# Patient Record
Sex: Female | Born: 1935 | Race: White | Hispanic: No | Smoking: Former smoker
Health system: Southern US, Community
[De-identification: ages and names within clinical notes are randomized; demographics above are authoritative.]

## PROBLEM LIST (undated history)

## (undated) DIAGNOSIS — I4819 Other persistent atrial fibrillation: Secondary | ICD-10-CM

## (undated) DIAGNOSIS — F0390 Unspecified dementia without behavioral disturbance: Secondary | ICD-10-CM

## (undated) DIAGNOSIS — N6310 Unspecified lump in the right breast, unspecified quadrant: Secondary | ICD-10-CM

## (undated) DIAGNOSIS — D509 Iron deficiency anemia, unspecified: Secondary | ICD-10-CM

## (undated) DIAGNOSIS — H269 Unspecified cataract: Secondary | ICD-10-CM

## (undated) DIAGNOSIS — I639 Cerebral infarction, unspecified: Secondary | ICD-10-CM

## (undated) DIAGNOSIS — E785 Hyperlipidemia, unspecified: Secondary | ICD-10-CM

## (undated) DIAGNOSIS — M8580 Other specified disorders of bone density and structure, unspecified site: Secondary | ICD-10-CM

## (undated) DIAGNOSIS — R001 Bradycardia, unspecified: Secondary | ICD-10-CM

## (undated) DIAGNOSIS — R42 Dizziness and giddiness: Secondary | ICD-10-CM

## (undated) DIAGNOSIS — K5520 Angiodysplasia of colon without hemorrhage: Secondary | ICD-10-CM

## (undated) DIAGNOSIS — M1990 Unspecified osteoarthritis, unspecified site: Secondary | ICD-10-CM

## (undated) DIAGNOSIS — G2581 Restless legs syndrome: Secondary | ICD-10-CM

## (undated) HISTORY — DX: Dizziness and giddiness: R42

## (undated) HISTORY — DX: Hyperlipidemia, unspecified: E78.5

## (undated) HISTORY — DX: Restless legs syndrome: G25.81

## (undated) HISTORY — DX: Unspecified cataract: H26.9

## (undated) HISTORY — DX: Cerebral infarction, unspecified: I63.9

## (undated) HISTORY — PX: BREAST EXCISIONAL BIOPSY: SUR124

## (undated) HISTORY — DX: Unspecified lump in the right breast, unspecified quadrant: N63.10

## (undated) HISTORY — DX: Bradycardia, unspecified: R00.1

## (undated) HISTORY — DX: Other specified disorders of bone density and structure, unspecified site: M85.80

## (undated) HISTORY — DX: Iron deficiency anemia, unspecified: D50.9

## (undated) HISTORY — PX: CARDIOVERSION: SHX1299

## (undated) HISTORY — DX: Angiodysplasia of colon without hemorrhage: K55.20

## (undated) HISTORY — DX: Other persistent atrial fibrillation: I48.19

---

## 2000-05-14 ENCOUNTER — Ambulatory Visit (HOSPITAL_COMMUNITY): Admission: RE | Admit: 2000-05-14 | Discharge: 2000-05-14 | Payer: Self-pay | Admitting: Gastroenterology

## 2000-07-07 ENCOUNTER — Other Ambulatory Visit: Admission: RE | Admit: 2000-07-07 | Discharge: 2000-07-07 | Payer: Self-pay | Admitting: Family Medicine

## 2001-10-29 ENCOUNTER — Other Ambulatory Visit: Admission: RE | Admit: 2001-10-29 | Discharge: 2001-10-29 | Payer: Self-pay | Admitting: Family Medicine

## 2003-12-23 ENCOUNTER — Encounter: Admission: RE | Admit: 2003-12-23 | Discharge: 2003-12-23 | Payer: Self-pay | Admitting: Orthopedic Surgery

## 2004-02-10 ENCOUNTER — Other Ambulatory Visit: Admission: RE | Admit: 2004-02-10 | Discharge: 2004-02-10 | Payer: Self-pay | Admitting: Family Medicine

## 2005-08-05 DIAGNOSIS — I639 Cerebral infarction, unspecified: Secondary | ICD-10-CM

## 2005-08-05 HISTORY — DX: Cerebral infarction, unspecified: I63.9

## 2005-11-08 ENCOUNTER — Ambulatory Visit: Payer: Self-pay | Admitting: Internal Medicine

## 2005-11-08 ENCOUNTER — Inpatient Hospital Stay (HOSPITAL_COMMUNITY): Admission: EM | Admit: 2005-11-08 | Discharge: 2005-11-13 | Payer: Self-pay | Admitting: Emergency Medicine

## 2005-11-09 ENCOUNTER — Encounter (INDEPENDENT_AMBULATORY_CARE_PROVIDER_SITE_OTHER): Payer: Self-pay | Admitting: Interventional Cardiology

## 2005-11-13 ENCOUNTER — Ambulatory Visit: Payer: Self-pay | Admitting: Cardiology

## 2006-07-21 ENCOUNTER — Other Ambulatory Visit: Admission: RE | Admit: 2006-07-21 | Discharge: 2006-07-21 | Payer: Self-pay | Admitting: Family Medicine

## 2006-11-10 ENCOUNTER — Ambulatory Visit: Payer: Self-pay | Admitting: Cardiology

## 2007-02-04 ENCOUNTER — Ambulatory Visit: Payer: Self-pay | Admitting: Cardiology

## 2008-04-20 ENCOUNTER — Ambulatory Visit: Payer: Self-pay | Admitting: Cardiology

## 2008-06-08 ENCOUNTER — Ambulatory Visit: Payer: Self-pay | Admitting: Cardiology

## 2008-08-10 ENCOUNTER — Ambulatory Visit: Payer: Self-pay | Admitting: Cardiology

## 2008-09-07 ENCOUNTER — Ambulatory Visit: Payer: Self-pay | Admitting: Cardiology

## 2008-10-24 DIAGNOSIS — M8580 Other specified disorders of bone density and structure, unspecified site: Secondary | ICD-10-CM | POA: Insufficient documentation

## 2008-10-24 DIAGNOSIS — M899 Disorder of bone, unspecified: Secondary | ICD-10-CM | POA: Insufficient documentation

## 2008-10-24 DIAGNOSIS — M949 Disorder of cartilage, unspecified: Secondary | ICD-10-CM

## 2008-10-24 DIAGNOSIS — I495 Sick sinus syndrome: Secondary | ICD-10-CM | POA: Insufficient documentation

## 2008-10-24 DIAGNOSIS — D508 Other iron deficiency anemias: Secondary | ICD-10-CM | POA: Insufficient documentation

## 2008-10-24 DIAGNOSIS — E785 Hyperlipidemia, unspecified: Secondary | ICD-10-CM

## 2008-10-24 DIAGNOSIS — I6350 Cerebral infarction due to unspecified occlusion or stenosis of unspecified cerebral artery: Secondary | ICD-10-CM | POA: Insufficient documentation

## 2008-10-27 ENCOUNTER — Ambulatory Visit: Payer: Self-pay | Admitting: Cardiology

## 2008-11-03 ENCOUNTER — Encounter: Payer: Self-pay | Admitting: Internal Medicine

## 2008-11-03 ENCOUNTER — Ambulatory Visit: Payer: Self-pay | Admitting: Internal Medicine

## 2008-11-03 DIAGNOSIS — I4891 Unspecified atrial fibrillation: Secondary | ICD-10-CM

## 2008-11-03 DIAGNOSIS — I482 Chronic atrial fibrillation, unspecified: Secondary | ICD-10-CM | POA: Insufficient documentation

## 2008-11-21 ENCOUNTER — Encounter: Payer: Self-pay | Admitting: Internal Medicine

## 2008-11-21 ENCOUNTER — Ambulatory Visit: Payer: Self-pay

## 2008-12-12 ENCOUNTER — Ambulatory Visit: Payer: Self-pay | Admitting: Internal Medicine

## 2008-12-15 ENCOUNTER — Telehealth: Payer: Self-pay | Admitting: Internal Medicine

## 2008-12-19 ENCOUNTER — Telehealth: Payer: Self-pay | Admitting: Internal Medicine

## 2008-12-26 ENCOUNTER — Telehealth: Payer: Self-pay | Admitting: Internal Medicine

## 2008-12-27 ENCOUNTER — Encounter (INDEPENDENT_AMBULATORY_CARE_PROVIDER_SITE_OTHER): Payer: Self-pay

## 2008-12-27 ENCOUNTER — Encounter: Payer: Self-pay | Admitting: Internal Medicine

## 2008-12-29 ENCOUNTER — Telehealth: Payer: Self-pay | Admitting: Internal Medicine

## 2009-01-05 ENCOUNTER — Encounter: Payer: Self-pay | Admitting: Internal Medicine

## 2009-01-12 ENCOUNTER — Ambulatory Visit: Payer: Self-pay | Admitting: Internal Medicine

## 2009-01-12 ENCOUNTER — Ambulatory Visit (HOSPITAL_COMMUNITY): Admission: RE | Admit: 2009-01-12 | Discharge: 2009-01-12 | Payer: Self-pay | Admitting: Internal Medicine

## 2009-02-09 ENCOUNTER — Ambulatory Visit: Payer: Self-pay | Admitting: Internal Medicine

## 2009-02-23 ENCOUNTER — Ambulatory Visit: Payer: Self-pay

## 2009-02-23 ENCOUNTER — Ambulatory Visit: Payer: Self-pay | Admitting: Internal Medicine

## 2009-04-24 ENCOUNTER — Ambulatory Visit: Payer: Self-pay | Admitting: Internal Medicine

## 2009-06-12 ENCOUNTER — Telehealth: Payer: Self-pay | Admitting: Internal Medicine

## 2009-08-11 ENCOUNTER — Encounter (INDEPENDENT_AMBULATORY_CARE_PROVIDER_SITE_OTHER): Payer: Self-pay | Admitting: *Deleted

## 2009-10-04 ENCOUNTER — Ambulatory Visit: Payer: Self-pay | Admitting: Cardiology

## 2009-10-13 ENCOUNTER — Ambulatory Visit: Payer: Self-pay | Admitting: Internal Medicine

## 2009-10-24 ENCOUNTER — Telehealth: Payer: Self-pay | Admitting: Internal Medicine

## 2009-11-10 ENCOUNTER — Ambulatory Visit (HOSPITAL_COMMUNITY): Admission: RE | Admit: 2009-11-10 | Discharge: 2009-11-10 | Payer: Self-pay | Admitting: Internal Medicine

## 2009-11-10 ENCOUNTER — Ambulatory Visit: Payer: Self-pay | Admitting: Internal Medicine

## 2009-11-10 ENCOUNTER — Encounter (INDEPENDENT_AMBULATORY_CARE_PROVIDER_SITE_OTHER): Payer: Self-pay | Admitting: *Deleted

## 2009-11-10 ENCOUNTER — Ambulatory Visit: Payer: Self-pay

## 2009-11-10 ENCOUNTER — Encounter: Payer: Self-pay | Admitting: Internal Medicine

## 2009-12-18 ENCOUNTER — Encounter: Payer: Self-pay | Admitting: Cardiology

## 2010-01-03 ENCOUNTER — Encounter: Payer: Self-pay | Admitting: Cardiology

## 2010-03-02 ENCOUNTER — Encounter (INDEPENDENT_AMBULATORY_CARE_PROVIDER_SITE_OTHER): Payer: Self-pay | Admitting: *Deleted

## 2010-03-02 ENCOUNTER — Ambulatory Visit: Payer: Self-pay | Admitting: Internal Medicine

## 2010-04-05 ENCOUNTER — Encounter: Payer: Self-pay | Admitting: Internal Medicine

## 2010-04-05 HISTORY — PX: OTHER SURGICAL HISTORY: SHX169

## 2010-04-10 ENCOUNTER — Encounter: Payer: Self-pay | Admitting: Internal Medicine

## 2010-04-16 ENCOUNTER — Ambulatory Visit: Payer: Self-pay | Admitting: Cardiovascular Disease

## 2010-04-16 ENCOUNTER — Ambulatory Visit (HOSPITAL_COMMUNITY): Admission: RE | Admit: 2010-04-16 | Discharge: 2010-04-16 | Payer: Self-pay | Admitting: Cardiovascular Disease

## 2010-04-17 ENCOUNTER — Ambulatory Visit: Payer: Self-pay | Admitting: Internal Medicine

## 2010-04-17 ENCOUNTER — Ambulatory Visit (HOSPITAL_COMMUNITY): Admission: RE | Admit: 2010-04-17 | Discharge: 2010-04-18 | Payer: Self-pay | Admitting: Internal Medicine

## 2010-05-10 ENCOUNTER — Encounter: Admission: RE | Admit: 2010-05-10 | Discharge: 2010-05-10 | Payer: Self-pay | Admitting: Family Medicine

## 2010-05-29 ENCOUNTER — Encounter: Payer: Self-pay | Admitting: Cardiology

## 2010-05-30 ENCOUNTER — Telehealth: Payer: Self-pay | Admitting: Cardiology

## 2010-05-30 ENCOUNTER — Ambulatory Visit: Payer: Self-pay | Admitting: Cardiology

## 2010-07-18 ENCOUNTER — Encounter: Payer: Self-pay | Admitting: Internal Medicine

## 2010-07-18 ENCOUNTER — Ambulatory Visit: Payer: Self-pay | Admitting: Internal Medicine

## 2010-07-23 ENCOUNTER — Encounter: Payer: Self-pay | Admitting: Internal Medicine

## 2010-09-02 LAB — CONVERTED CEMR LAB
ALT: 21 units/L (ref 0–35)
AST: 24 units/L (ref 0–37)
Albumin: 3.8 g/dL (ref 3.5–5.2)
Alkaline Phosphatase: 63 units/L (ref 39–117)
Bilirubin, Direct: 0.1 mg/dL (ref 0.0–0.3)
Free T4: 1.1 ng/dL (ref 0.6–1.6)
TSH: 1.43 microintl units/mL (ref 0.35–5.50)
Total Bilirubin: 0.5 mg/dL (ref 0.3–1.2)
Total Protein: 6.9 g/dL (ref 6.0–8.3)

## 2010-09-06 ENCOUNTER — Telehealth: Payer: Self-pay | Admitting: Internal Medicine

## 2010-09-06 NOTE — Miscellaneous (Signed)
Summary: Outpatient Coinsurance Notice  Outpatient Coinsurance Notice   Imported By: Marylou Mccoy 11/10/2009 18:10:22  _____________________________________________________________________  External Attachment:    Type:   Image     Comment:   External Document

## 2010-09-06 NOTE — Miscellaneous (Signed)
  Clinical Lists Changes  Medications: Changed medication from AMIODARONE HCL 200 MG TABS (AMIODARONE HCL) Take one tablet by mouth once daily to AMIODARONE HCL 200 MG TABS (AMIODARONE HCL) Take 1/2tablet by mouth once daily

## 2010-09-06 NOTE — Assessment & Plan Note (Signed)
Summary: [facility]   Visit Type:  Follow-up Primary Provider:  Rudi Heap, MD  CC:  Atrial Fibrillation.  History of Present Illness: The patient presents for followup after atrial fibrillation ablation. Since that procedure she has done well. She has had new palpitations and no sustained  symptoms. She has had no presyncope or syncope. She has had no shortness of breath, PND or orthopnea. She had no chest pain.  She had no problems her vascular access sites. She remains off antiarrhythmics but she is still on Coumadin.  Current Medications (verified): 1)  Warfarin Sodium 5 Mg Tabs (Warfarin Sodium) .... Use As Directed By Anticoagulation Clinic 2)  Lipitor 10 Mg Tabs (Atorvastatin Calcium) .... Take One Tablet By Mouth Daily. 3)  Multivitamins   Tabs (Multiple Vitamin) .Marland Kitchen.. 1 By Mouth Once Daily 4)  Vitamin D 2000 Unit Caps (Cholecalciferol) .... Once Daily 5)  Magnesium Oxide 250 Mg Tabs (Magnesium Oxide) .... Once Daily 6)  Protonix 40 Mg Solr (Pantoprazole Sodium) .Marland Kitchen.. 1 By Mouth Daily 7)  Calcium 500 Mg Tabs (Calcium) .Marland Kitchen.. 1 By Mouth Daily  Allergies (verified): 1)  ! * No Ivp Dye Allergy 2)  ! * No Latex Allergy 3)  ! * No Shellfish Allergy  Past History:  Past Medical History: Reviewed history from 12/12/2008 and no changes required.  1. Persistent atrial fibrillation  2. Recently-diagnosed hyperlipidemia.  3. Cerebrovascular accident 2007.  4. Sinus bradycardia.  5. Microcytic anemia.  6. Osteopenia.  7. 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  8. Benign breast mass.  Past Surgical History: Reviewed history from 10/24/2008 and no changes required. Breast mass resected.  Review of Systems       As stated in the HPI and negative for all other systems.   Vital Signs:  Patient profile:   77 year old female Height:      62 inches Weight:      121 pounds BMI:     22.21 Pulse rate:   54 / minute Resp:     16 per minute BP sitting:   126  / 62  (right arm)  Vitals Entered By: Marrion Coy, CNA (May 30, 2010 1:21 PM)  Physical Exam  General:  thin elderly female, NAD Head:  normocephalic and atraumatic Eyes:  PERRLA/EOM intact; conjunctiva and lids normal. Neck:  Neck supple, no JVD. No masses, thyromegaly or abnormal cervical nodes. Chest Wall:  no deformities or breast masses noted Lungs:  Clear bilaterally to auscultation and percussion. Heart:  Non-displaced PMI, chest non-tender; regular rate and rhythm, S1, S2 without murmurs, rubs or gallops. Carotid upstroke normal, no bruit. Normal abdominal aortic size, no bruits. Femorals normal pulses, no bruits. Pedals normal pulses. No edema, no varicosities. Abdomen:  Bowel sounds positive; abdomen soft and non-tender without masses, organomegaly, or hernias noted. No hepatosplenomegaly. Msk:  Back normal, normal gait. Muscle strength and tone normal. Extremities:  No clubbing or cyanosis. Neurologic:  Alert and oriented x 3. Skin:  Intact without lesions or rashes. Cervical Nodes:  no significant adenopathy Psych:  Normal affect.   EKG  Procedure date:  05/30/2010  Findings:      Sinus rhythm, rate52, axis within normal limits, intervals within normal limits no acute ST-T wave changes, poor anterior R-wave progression  Impression & Recommendations:  Problem # 1:  ATRIAL FIBRILLATION (ICD-427.31) Thus far she has had successful ablation of her atrial fibrillation. I will defer to Dr. Johney Frame whether he wants to discontinue her  coumadin.  She will see him in December. Orders: EKG w/ Interpretation (93000)  Problem # 2:  HYPERTENSION (ICD-401.9) Her blood pressure is controlled and no further therapy is indicated.  Patient Instructions: 1)  Your physician recommends that you schedule a follow-up appointment in: 1 yr with Dr Antoine Poche in [place] office 2)  Your physician recommends that you continue on your current medications as directed. Please refer to the  Current Medication list given to you today.  Appended Document: [facility] Of note the patient is to have a possible biopsy or removal of a breast lump. If this is necessary she would be at acceptable risk from a surgical standpoint. She could come off of Coumadin.

## 2010-09-06 NOTE — Miscellaneous (Signed)
Summary: med change  Clinical Lists Changes  Observations: Added new observation of PI CARDIO: Your physician has recommended that you have an ablation.  Catheter ablation is a medical procedure used to treat some cardiac arrhythmias (irregular heartbeats). During catheter ablation, a long, thin, flexible tube is put into a blood vessel in your groin (upper thigh), or neck. This tube is called an ablation catheter. It is then guided to your heart through the blood vessel. Radiofrequency waves destroy small areas of heart tissue where abnormal heartbeats may cause an arrhythmia to start.  Please see the instruction sheet given to you today. Your physician has recommended you make the following change in your medication: decrease Amiodarone to 1/2 tablet daily (03/02/2010 12:17)      Patient Instructions: 1)  Your physician has recommended that you have an ablation.  Catheter ablation is a medical procedure used to treat some cardiac arrhythmias (irregular heartbeats). During catheter ablation, a long, thin, flexible tube is put into a blood vessel in your groin (upper thigh), or neck. This tube is called an ablation catheter. It is then guided to your heart through the blood vessel. Radiofrequency waves destroy small areas of heart tissue where abnormal heartbeats may cause an arrhythmia to start.  Please see the instruction sheet given to you today. 2)  Your physician has recommended you make the following change in your medication: decrease Amiodarone to 1/2 tablet daily

## 2010-09-06 NOTE — Assessment & Plan Note (Signed)
Summary: [facility]   Visit Type:  Follow-up Primary Provider:  Rudi Heap, MD  CC:  Atrial Fibrillation.Marland Kitchen  History of Present Illness: The patient presents for followup of this. She has seen Dr. all read. There was discussion of atrial fibrillation ablation. However, he chose to treat her with Rythmol first. She had cardioversion and did maintain sinus rhythm for a period afterward. However, at some time in the past weeks she noted irregular heartbeat, fatigue similar to her previous fibrillation. She assumed she was in fibrillation and this is demonstrated to date. She has not had any chest pressure, neck or arm discomfort. She has not had any presyncope or syncope. She has had no new shortness of breath, PND or orthopnea.  Current Medications (verified): 1)  Warfarin Sodium 5 Mg Tabs (Warfarin Sodium) .... Use As Directed By Anticoagulation Clinic 2)  Lipitor 10 Mg Tabs (Atorvastatin Calcium) .... Take One Tablet By Mouth Daily. 3)  Multivitamins   Tabs (Multiple Vitamin) .Marland Kitchen.. 1 By Mouth Once Daily 4)  Vitamin D 400 Unit  Tabs (Cholecalciferol) .Marland Kitchen.. 1 By Mouth Once Daily 5)  Rythmol Sr 225 Mg Xr12h-Cap (Propafenone Hcl) .... Q 12 Hours 6)  Calcium Carbonate   Powd (Calcium Carbonate) .Marland Kitchen.. 1 By Mouth Once Daily 7)  Magnesium 200 Mg Tabs (Magnesium) .... With Zinc 400mg  Daily  Allergies (verified): 1)  ! * No Ivp Dye Allergy 2)  ! * No Latex Allergy 3)  ! * No Shellfish Allergy  Past History:  Past Medical History: Reviewed history from 12/12/2008 and no changes required.  1. Persistent atrial fibrillation  2. Recently-diagnosed hyperlipidemia.  3. Cerebrovascular accident 2007.  4. Sinus bradycardia.  5. Microcytic anemia.  6. Osteopenia.  7. 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  8. Benign breast mass.  Past Surgical History: Reviewed history from 10/24/2008 and no changes required. Breast mass resected.  Review of Systems       As stated in the  HPI and negative for all other systems.   Vital Signs:  Patient profile:   77 year old female Height:      62 inches Weight:      114 pounds BMI:     20.93 Pulse rate:   107 / minute Resp:     16 per minute BP sitting:   112 / 70  (right arm)  Vitals Entered By: Marrion Coy, CNA (October 04, 2009 2:55 PM)  Physical Exam  General:  Well developed, well nourished, in no acute distress. Head:  normocephalic and atraumatic Eyes:  PERRLA/EOM intact; conjunctiva and lids normal. Mouth:  Dentures. Oral mucosa normal. Neck:  Neck supple, no JVD. No masses, thyromegaly or abnormal cervical nodes. Chest Wall:  no deformities or breast masses noted Lungs:  Clear bilaterally to auscultation and percussion. Abdomen:  Bowel sounds positive; abdomen soft and non-tender without masses, organomegaly, or hernias noted. No hepatosplenomegaly. Msk:  Back normal, normal gait. Muscle strength and tone normal. Extremities:  No clubbing or cyanosis. Neurologic:  Alert and oriented x 3. Skin:  Intact without lesions or rashes. Psych:  Normal affect.   Detailed Cardiovascular Exam  Neck    Carotids: Carotids full and equal bilaterally without bruits.      Neck Veins: Normal, no JVD.    Heart    Inspection: no deformities or lifts noted.      Palpation: normal PMI with no thrills palpable.      Auscultation: irregular rate and rhythm, S1,  S2 without murmurs, rubs, gallops, or clicks.    Vascular    Abdominal Aorta: no palpable masses, pulsations, or audible bruits.      Femoral Pulses: normal femoral pulses bilaterally.      Pedal Pulses: normal pedal pulses bilaterally.      Radial Pulses: normal radial pulses bilaterally.      Peripheral Circulation: no clubbing, cyanosis, or edema noted with normal capillary refill.     EKG  Procedure date:  10/04/2009  Findings:      she'll fibrillation, rate 100, axis within normal limits, intervals within normal limits, low voltage in limb  leads  Impression & Recommendations:  Problem # 1:  ATRIAL FIBRILLATION (ICD-427.31) The patient is back in atrial fibrillation and she feels this. She's failed rate control in the past with tachybradycardia response to metoprolol and pindolol. We could consider further antiarrhythmic therapy but I would also again like to discuss the possibility of ablation with Dr. Johney Frame. I have sent him a note and we'll discuss further care plans with the patient after consultation with him. Of note she would like to avoid decrease in therapy and she understands that requires hospitalization. She continues on her Coumadin followed by her primary physicians.  Patient Instructions: 1)  Your physician recommends that you schedule a follow-up appointment in: 6 MONTHS 2)  Your physician has recommended you make the following change in your medication: STOP RHYTHMOL

## 2010-09-06 NOTE — Miscellaneous (Signed)
  Clinical Lists Changes  Observations: Added new observation of ECHOINTERP: - Left ventricle: The cavity size was normal. Wall thickness was       normal. Systolic function was normal. The estimated ejection       fraction was in the range of 50% to 55%. Septal hypokinesis. Wall       motion was normal; there were no regional wall motion       abnormalities.     - Mitral valve: Mildly calcified annulus. Mild regurgitation.     - Left atrium: The atrium was moderately dilated.     - Right atrium: The atrium was moderately dilated.     - Tricuspid valve: Mild-moderate regurgitation.     - Pulmonary arteries: Systolic pressure was mildly to moderately       increased. PA peak pressure: 45mm Hg (S). (11/10/2009 9:21)      Echocardiogram  Procedure date:  11/10/2009  Findings:      - Left ventricle: The cavity size was normal. Wall thickness was       normal. Systolic function was normal. The estimated ejection       fraction was in the range of 50% to 55%. Septal hypokinesis. Wall       motion was normal; there were no regional wall motion       abnormalities.     - Mitral valve: Mildly calcified annulus. Mild regurgitation.     - Left atrium: The atrium was moderately dilated.     - Right atrium: The atrium was moderately dilated.     - Tricuspid valve: Mild-moderate regurgitation.     - Pulmonary arteries: Systolic pressure was mildly to moderately       increased. PA peak pressure: 45mm Hg (S).

## 2010-09-06 NOTE — Progress Notes (Signed)
Summary: pt needs note to stop coumadin for surgery  Phone Note From Other Clinic   Caller: Edie from Dr. Jamey Ripa Request: Talk with Nurse, Talk with Provider Summary of Call: pt may be having brest surgery and needs to know when she can stop her coumidan pls fax to [phone] attn: Edie she is the nurse for Dr. Jamey Ripa  Initial call taken by: Omer Jack,  May 30, 2010 3:29 PM  Follow-up for Phone Call        I faxed Dr Jenene Slicker office note from 05/30/10 that says she could come off Coumadin  to Dr Jamey Ripa attn Edie--there was no call back number for [facility]

## 2010-09-06 NOTE — Progress Notes (Signed)
Summary: med question  Phone Note Call from Patient Call back at Home Phone [phone]   Caller: Patient Reason for Call: Talk to Nurse Summary of Call: request to speak to nurse about meds, please call before 10:30 Initial call taken by: Migdalia Dk,  October 24, 2009 8:27 AM  Follow-up for Phone Call        INR-3.1 yesterday but she is having a some stomach problems.  Discussed with Dr Johney Frame he said to give it some time and hopes that the stomach problems will subside.  Call me back if no better in a couple of weeks.  LMOM Dennis Bast, RN, BSN  October 25, 2009 2:26 PM

## 2010-09-06 NOTE — Letter (Signed)
Summary: ELectrophysiology/Ablation Procedure Instructions  [facility], Main Office  [address]   [place], [place] [address]   Phone: [phone]  Fax: [phone]     Electrophysiology/Ablation Procedure Instructions    You are scheduled for a(n) afib ablation on 04/17/10 at 7:30am with Dr. Johney Frame.  1.  Please come to the Short Stay Center at [facility] at 5:30am on the day of your procedure.  2.  Come prepared to stay overnight.   Please bring your insurance cards and a list of your medications.  3.  Come to Dr Kathi Der office on 04/10/10 for lab work. You do not have to be fasting.  4.  Do not have anything to eat or drink after midnight the night before your procedure.  5.   All of your medications may be taken with a small amount of water.  6.  Educational material received:  _____ EP   _____ Ablation   * Occasionally, EP studies and ablations can become lengthy.  Please make your family aware of this before your procedure starts.  Average time ranges from 2-8 hours for EP studies/ablations.  Your physician will locate your family after the procedure with the results.  * If you have any questions after you get home, please call the office at [phone]. [name]

## 2010-09-06 NOTE — Assessment & Plan Note (Signed)
Summary: per check out/ok per jamie/saf   Visit Type:  Follow-up Referring Provider:  Angelina Sheriff, MD Primary Provider:  Rudi Heap, MD   History of Present Illness: The patient presents today for routine electrophysiology followup.  She continues to be "breathless" at times.  She also reports occasional chest "fluttering".  The patient denies symptoms of  chest pain, orthopnea, PND, lower extremity edema, dizziness, presyncope, syncope, or neurologic sequela. The patient is tolerating medications without difficulties and is otherwise without complaint today.   Current Medications (verified): 1)  Warfarin Sodium 5 Mg Tabs (Warfarin Sodium) .... Use As Directed By Anticoagulation Clinic 2)  Lipitor 10 Mg Tabs (Atorvastatin Calcium) .... Take One Tablet By Mouth Daily. 3)  Multivitamins   Tabs (Multiple Vitamin) .Marland Kitchen.. 1 By Mouth Once Daily 4)  Vitamin D 2000 Unit Caps (Cholecalciferol) .... Once Daily 5)  All Day Calcium 600-500 Mg-Unit Xr24h-Tab (Calcium Carb-Cholecalciferol) .... Once Daily 6)  Magnesium Oxide 250 Mg Tabs (Magnesium Oxide) .... Once Daily 7)  Amiodarone Hcl 200 Mg Tabs (Amiodarone Hcl) .... Take One Tablet By Mouth Twice A Day  Allergies (verified): 1)  ! * No Ivp Dye Allergy 2)  ! * No Latex Allergy 3)  ! * No Shellfish Allergy  Past History:  Past Medical History: Reviewed history from 12/12/2008 and no changes required.  1. Persistent atrial fibrillation  2. Recently-diagnosed hyperlipidemia.  3. Cerebrovascular accident 2007.  4. Sinus bradycardia.  5. Microcytic anemia.  6. Osteopenia.  7. 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  8. Benign breast mass.  Past Surgical History: Reviewed history from 10/24/2008 and no changes required. Breast mass resected.  Social History: Reviewed history from 11/03/2008 and no changes required. The patient is retired.  She is married.  She has four stepchildren.  She quit smoking in the 1980s after one  pack per day for 30 years. She denies alcohol use she lives in [place].  Her husband recently had a stroke and is presently living in a nursing home.  Review of Systems       All systems are reviewed and negative except as listed in the HPI.   Vital Signs:  Patient profile:   77 year old female Height:      62 inches Weight:      117 pounds BMI:     21.48 Pulse rate:   41 / minute BP sitting:   128 / 64  (left arm)  Vitals Entered By: Laurance Flatten CMA (November 10, 2009 11:31 AM)  Physical Exam  General:  thin elderly female, NAD Head:  normocephalic and atraumatic Eyes:  PERRLA/EOM intact; conjunctiva and lids normal. Mouth:  Teeth, gums and palate normal. Oral mucosa normal. Neck:  Neck supple, no JVD. No masses, thyromegaly or abnormal cervical nodes. Lungs:  Clear bilaterally to auscultation and percussion. Heart:  RRR, no m/r/g Abdomen:  Bowel sounds positive; abdomen soft and non-tender without masses, organomegaly, or hernias noted. No hepatosplenomegaly. Msk:  Back normal, normal gait. Muscle strength and tone normal. Pulses:  pulses normal in all 4 extremities Extremities:  No clubbing or cyanosis. Neurologic:  Alert and oriented x 3. Skin:  Intact without lesions or rashes. Cervical Nodes:  no significant adenopathy Psych:  Normal affect.   EKG  Procedure date:  11/10/2009  Findings:      sinus bradycardia 40  bpm, PR 144, QTc 448, otherwise normal ekg  Impression & Recommendations:  Problem # 1:  ATRIAL FIBRILLATION (ICD-427.31)  She appears to be in sinus bradycardia today.  I will decrease amiodarone to 200mg  daily today.  We had a long discussion regarding therapeutic options.  I am awaiting echo results to evaluate LA size.  I think that we should consider cathter ablation given her concerns about longterm amiodarone, however, she would like to continue amiodarone at this time. She is quite bradycardic but stable.  She is clear in her decision to avoid  pacemaker implant.  I will decrease amiodarone to 100mg  daily if she remains in sinus rhythm upon return.  Problem # 2:  SINUS BRADYCARDIA (ICD-427.81) decrease amiodarone today pt declines pacemaker consider decreasing amiodarone to 100mg  daily upon return and possibly catheter ablation  Problem # 3:  HYPERTENSION (ICD-401.9) stable  Other Orders: EKG w/ Interpretation (93000)  Patient Instructions: 1)  Your physician recommends that you schedule a follow-up appointment in: 2 months with Dr Johney Frame 2)  Your physician has recommended you make the following change in your medication: decrease Amiodarone to 200mg  daily

## 2010-09-06 NOTE — Letter (Signed)
Summary: Appointment - Reminder 2  [facility], Main Office  [address]   [place], [place] [address]   Phone: [phone]  Fax: [phone]     August 11, 2009 MRN: [id number]   [name] [address] [place], [place]  [address]   Dear Ms. UTKE,  Our records indicate that it is time to schedule a follow-up appointment. Dr.Hochrein recommended that you follow up with [place] in March,2011. It is very important that we reach you to schedule this appointment. We look forward to participating in your health care needs. Please contact us at the number listed above at your earliest convenience to schedule your appointment.  If you are unable to make an appointment at this time, give [place] a call so we can update our records.     Sincerely,   Glass blower/designer

## 2010-09-06 NOTE — Assessment & Plan Note (Signed)
Summary: rov/discuss ablation   Visit Type:  Follow-up Referring Provider:  Angelina Sheriff, MD Primary Provider:  Rudi Heap, MD   History of Present Illness: The patient presents today for electrophysiology followup. She did well initially with rhythmol.  Unfortunately, she has developed recurrent symptomatic afib.  She reports being in afib all of the time for several months.  She reports palpitations, fatigue, and decreased exercise tolerance.  The patient denies symptoms ofchest pain, shortness of breath, orthopnea, PND, lower extremity edema, presyncope, syncope, or neurologic sequela.  She has occasional vertiginous dizziness.  The patient is tolerating medications without difficulties and is otherwise without complaint today.   Current Medications (verified): 1)  Warfarin Sodium 5 Mg Tabs (Warfarin Sodium) .... Use As Directed By Anticoagulation Clinic 2)  Lipitor 10 Mg Tabs (Atorvastatin Calcium) .... Take One Tablet By Mouth Daily. 3)  Multivitamins   Tabs (Multiple Vitamin) .Marland Kitchen.. 1 By Mouth Once Daily 4)  Vitamin D 2000 Unit Caps (Cholecalciferol) .... Once Daily 5)  All Day Calcium 600-500 Mg-Unit Xr24h-Tab (Calcium Carb-Cholecalciferol) .... Once Daily 6)  Magnesium Oxide 250 Mg Tabs (Magnesium Oxide) .... Once Daily  Allergies: 1)  ! * No Ivp Dye Allergy 2)  ! * No Latex Allergy 3)  ! * No Shellfish Allergy  Past History:  Past Medical History: Reviewed history from 12/12/2008 and no changes required.  1. Persistent atrial fibrillation  2. Recently-diagnosed hyperlipidemia.  3. Cerebrovascular accident 2007.  4. Sinus bradycardia.  5. Microcytic anemia.  6. Osteopenia.  7. 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  8. Benign breast mass.  Past Surgical History: Reviewed history from 10/24/2008 and no changes required. Breast mass resected.  Social History: Reviewed history from 11/03/2008 and no changes required. The patient is retired.  She is  married.  She has four stepchildren.  She quit smoking in the 1980s after one pack per day for 30 years. She denies alcohol use she lives in [place].  Her husband recently had a stroke and is presently living in a nursing home.  Review of Systems       All systems are reviewed and negative except as listed in the HPI.   Vital Signs:  Patient profile:   77 year old female Height:      62 inches Weight:      115 pounds BMI:     21.11 Pulse rate:   102 / minute BP sitting:   132 / 80  (left arm)  Vitals Entered By: Laurance Flatten CMA (October 13, 2009 12:42 PM)  Physical Exam  General:  thin elderly female, NAD Head:  normocephalic and atraumatic Eyes:  PERRLA/EOM intact; conjunctiva and lids normal. Mouth:  Teeth, gums and palate normal. Oral mucosa normal. Neck:  Neck supple, no JVD. No masses, thyromegaly or abnormal cervical nodes. Lungs:  Clear bilaterally to auscultation and percussion. Heart:  iRRR, no m/r/g Abdomen:  Bowel sounds positive; abdomen soft and non-tender without masses, organomegaly, or hernias noted. No hepatosplenomegaly. Msk:  Back normal, normal gait. Muscle strength and tone normal. Pulses:  pulses normal in all 4 extremities Extremities:  No clubbing or cyanosis. Neurologic:  Alert and oriented x 3.  strength/sensation are intact Skin:  Intact without lesions or rashes. Psych:  Normal affect.   Impression & Recommendations:  Problem # 1:  ATRIAL FIBRILLATION (ICD-427.31) The patient presents today for EP follow of atrial fibrillation.  She has symptomatic persistent atrial fibrillation.  She has failed medical therapy with  flecainide and beta blockers.  Antiarrhythmic options have been limited by bradycardia in the past, though she has not been very symptomatic with bradycardia.  Her last echo was 2007.  Therapeutic strategies for afib including medicine and ablation were discussed in detail with the patient today. Risk, benefits, and alternatives to EP study  and radiofrequency ablation for afib were also discussed in detail today. These risks include but are not limited to stroke, bleeding, vascular damage, tamponade, perforation, damage to the esophagus, lungs, and other structures, pulmonary vein stenosis, worsening renal function, and death.  Given that she has persistent afib, her success rates from ablation are anticipated to be reduced (60-70%) and will likely require more than 1 procedure.  The patient understands these risk and wishes to continue medical therapy at this time.  I will start the patient on Amiodarone 200mg  BID today.  She is aware to have her INRs followed closely by her PCP.  She may need cardioversion if she remains in afib upon return in 4 wks. We will obtain an echocardiogram to evaluate LA size.  Problem # 2:  SINUS BRADYCARDIA (ICD-427.81) stable will follow HR once back in sinus  Problem # 3:  HYPERTENSION (ICD-401.9) stable  Problem # 4:  HYPERLIPIDEMIA (ICD-272.4)  stable Her updated medication list for this problem includes:    Lipitor 10 Mg Tabs (Atorvastatin calcium) .Marland Kitchen... Take one tablet by mouth daily.  Her updated medication list for this problem includes:    Lipitor 10 Mg Tabs (Atorvastatin calcium) .Marland Kitchen... Take one tablet by mouth daily.  Other Orders: TLB-T4 (Thyrox), Free [phone]) TLB-TSH (Thyroid Stimulating Hormone) (84443-TSH) TLB-Hepatic/Liver Function Pnl (80076-HEPATIC) Echocardiogram (Echo)  Patient Instructions: 1)  Your physician recommends that you schedule a follow-up appointment in: 4 weeks. 2)  Your physician has recommended you make the following change in your medication: Start Amiodarone 200mg  two times a day. 3)  Your physician has requested that you have an echocardiogram.  Echocardiography is a painless test that uses sound waves to create images of your heart. It provides your doctor with information about the size and shape of your heart and how well your heart's chambers and  valves are working.  This procedure takes approximately one hour. There are no restrictions for this procedure. Prescriptions: AMIODARONE HCL 200 MG TABS (AMIODARONE HCL) Take one tablet by mouth twice a day  #60 x 1   Entered by:   Laurance Flatten CMA   Authorized by:   Hillis Range, MD   Signed by:   Laurance Flatten CMA on 10/13/2009   Method used:   Electronically to        [facility] [phone]* (retail)       [address] [place], [place]  [address]       Ph: [phone] or [phone]       Fax: [phone]   RxID:   [id number]

## 2010-09-06 NOTE — Assessment & Plan Note (Signed)
Summary: 2 MONTH/[name]   Visit Type:  Follow-up Referring Provider:  Angelina Sheriff, MD Primary Provider:  Rudi Heap, MD   History of Present Illness: The patient presents today for routine electrophysiology followup.  She continues to episodes of afib despite medical therapy with amiodarone.  She reports tremors and unsteadiness with amiodarone.  The patient denies symptoms of  chest pain, orthopnea, PND, lower extremity edema, dizziness, presyncope, syncope, or neurologic sequela. The patient is tolerating medications without difficulties and is otherwise without complaint today.   Current Medications (verified): 1)  Warfarin Sodium 5 Mg Tabs (Warfarin Sodium) .... Use As Directed By Anticoagulation Clinic 2)  Lipitor 10 Mg Tabs (Atorvastatin Calcium) .... Take One Tablet By Mouth Daily. 3)  Multivitamins   Tabs (Multiple Vitamin) .Marland Kitchen.. 1 By Mouth Once Daily 4)  Vitamin D 2000 Unit Caps (Cholecalciferol) .... Once Daily 5)  All Day Calcium 600-500 Mg-Unit Xr24h-Tab (Calcium Carb-Cholecalciferol) .... Once Daily 6)  Magnesium Oxide 250 Mg Tabs (Magnesium Oxide) .... Once Daily 7)  Amiodarone Hcl 200 Mg Tabs (Amiodarone Hcl) .... Take One Tablet By Mouth Once Daily  Allergies: 1)  ! * No Ivp Dye Allergy 2)  ! * No Latex Allergy 3)  ! * No Shellfish Allergy  Past History:  Past Medical History: Reviewed history from 12/12/2008 and no changes required.  1. Persistent atrial fibrillation  2. Recently-diagnosed hyperlipidemia.  3. Cerebrovascular accident 2007.  4. Sinus bradycardia.  5. Microcytic anemia.  6. Osteopenia.  7. 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  8. Benign breast mass.  Past Surgical History: Reviewed history from 10/24/2008 and no changes required. Breast mass resected.  Family History: Reviewed history from 11/03/2008 and no changes required. stroke  Social History: The patient is retired.  She is widowed.  She has four stepchildren.   She quit smoking in the 1980s after one pack per day for 30 years. She denies alcohol use she lives in [place].    Review of Systems       All systems are reviewed and negative except as listed in the HPI.   Vital Signs:  Patient profile:   77 year old female Height:      62 inches Weight:      116 pounds BMI:     21.29 Pulse rate:   41 / minute BP sitting:   130 / 80  (left arm)  Vitals Entered By: Laurance Flatten CMA (March 02, 2010 11:16 AM)  Physical Exam  General:  thin elderly female, NAD Head:  normocephalic and atraumatic Eyes:  PERRLA/EOM intact; conjunctiva and lids normal. Mouth:  Teeth, gums and palate normal. Oral mucosa normal. Neck:  Neck supple, no JVD. No masses, thyromegaly or abnormal cervical nodes. Lungs:  Clear bilaterally to auscultation and percussion. Heart:  RRR, no m/r/g Abdomen:  Bowel sounds positive; abdomen soft and non-tender without masses, organomegaly, or hernias noted. No hepatosplenomegaly. Msk:  Back normal, normal gait. Muscle strength and tone normal. Pulses:  pulses normal in all 4 extremities Extremities:  No clubbing or cyanosis. Neurologic:  Alert and oriented x 3.   EKG  Procedure date:  03/02/2010  Findings:      sinus bradycardia 40 bpm, otherwise normal ekg  Impression & Recommendations:  Problem # 1:  ATRIAL FIBRILLATION (ICD-427.31) The patient has symptomatic persistent atrial fibrillation.  She has failed medical therapy with amiodarone and reports symptoms of tremors and unsteadiness with this medicine.   I will decrease amiodarone to  100mg  daily today.  We had a long discussion regarding therapeutic options.  I think that we should consider cathter ablation given her concerns about longterm amiodarone.   Risk, benefits, and alternatives to EP study and radiofrequency ablation for afib were also discussed in detail today. These risks include but are not limited to stroke, bleeding, vascular damage, tamponade, perforation,  damage to the esophagus, lungs, and other structures, pulmonary vein stenosis, worsening renal function, and death. The patient understands these risk and wishes to proceed.  We will schedule afib ablation at the next available time.  Problem # 2:  SINUS BRADYCARDIA (ICD-427.81) stable, without symptoms.  She remains clear in her desire to avoid pacemaker. decrease amiodarone

## 2010-09-06 NOTE — Assessment & Plan Note (Signed)
Summary: [name]   Visit Type:  Follow-up Referring Provider:  Angelina Sheriff, MD Primary Provider:  Rudi Heap, MD   History of Present Illness: The patient presents for followup after atrial fibrillation ablation.   She reports doing very well since the procedure.  She is pleased with the results and denies procedure related complications.  She his now off of amiodarone and has had no further afib.  The patient denies symptoms of palpitations, chest pain, shortness of breath, orthopnea, PND, lower extremity edema, dizziness, presyncope, syncope, or neurologic sequela. The patient is tolerating medications without difficulties and is otherwise without complaint today.      Current Medications (verified): 1)  Warfarin Sodium 5 Mg Tabs (Warfarin Sodium) .... Use As Directed By Anticoagulation Clinic 2)  Lipitor 10 Mg Tabs (Atorvastatin Calcium) .... Take One Tablet By Mouth Daily. 3)  Multivitamins   Tabs (Multiple Vitamin) .Marland Kitchen.. 1 By Mouth Once Daily 4)  Vitamin D 2000 Unit Caps (Cholecalciferol) .... Once Daily 5)  Magnesium Oxide 250 Mg Tabs (Magnesium Oxide) .... Once Daily 6)  Calcium 500 Mg Tabs (Calcium) .Marland Kitchen.. 1 By Mouth Daily 7)  Sm Allergy Relief 10 Mg Tbdp (Loratadine) .... Uad  Allergies: 1)  ! * No Ivp Dye Allergy 2)  ! * No Latex Allergy 3)  ! * No Shellfish Allergy  Past History:  Past Medical History:  1. Persistent atrial fibrillation s/p PVI 9/11  2. Recently-diagnosed hyperlipidemia.  3. Cerebrovascular accident 2007.  4. Sinus bradycardia.  5. Microcytic anemia.  6. Osteopenia.  7. 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  8. Benign breast mass.  Past Surgical History: Breast mass resected. s/p afib ablation 9/11  Physical Exam  General:  thin elderly female, NAD Head:  normocephalic and atraumatic Eyes:  PERRLA/EOM intact; conjunctiva and lids normal. Mouth:  Teeth, gums and palate normal. Oral mucosa normal. Neck:  Neck supple, no  JVD. No masses, thyromegaly or abnormal cervical nodes. Lungs:  Clear bilaterally to auscultation and percussion. Heart:  Non-displaced PMI, chest non-tender; regular rate and rhythm, S1, S2 without murmurs, rubs or gallops. Carotid upstroke normal, no bruit. Normal abdominal aortic size, no bruits. Femorals normal pulses, no bruits. Pedals normal pulses. No edema, no varicosities. Abdomen:  Bowel sounds positive; abdomen soft and non-tender without masses, organomegaly, or hernias noted. No hepatosplenomegaly. Msk:  Back normal, normal gait. Muscle strength and tone normal. Extremities:  No clubbing or cyanosis. Neurologic:  Alert and oriented x 3.   EKG  Procedure date:  07/18/2010  Findings:      sinus bradycardia 50 bpm, PR 146, otherwise normal ekg  Impression & Recommendations:  Problem # 1:  ATRIAL FIBRILLATION (ICD-427.31) doing well s/p ablation without further afib off AAD. She wishes to consider pradaxa at this time.  Given her prior stroke, I would recommend either coumadin or pradaxa longterm. We will therefore stop coumadin and then start pradaxa 150mg  two times a day  Patient Instructions: 1)  Your physician wants you to follow-up in: 6 months with Dr Johney Frame  Bonita Quin will receive a reminder letter in the mail two months in advance. If you don't receive a letter, please call our office to schedule the follow-up appointment. 2)  Patient will call me several days prior to running out of Coumadin so I can Call in Pradaxa for her 3)  Pradaxa 150mg  two times a day  Appended Document: [facility]     Allergies: 1)  ! * No Ivp Dye  Allergy 2)  ! * No Latex Allergy 3)  ! * No Shellfish Allergy  Vital Signs:  Patient profile:   77 year old female Height:      62 inches Weight:      121 pounds BMI:     22.21 Pulse rate:   56 / minute BP sitting:   120 / 78  (left arm)  Vitals Entered By: Laurance Flatten CMA (July 18, 2010 2:24 PM)

## 2010-09-07 ENCOUNTER — Telehealth: Payer: Self-pay | Admitting: Internal Medicine

## 2010-09-12 NOTE — Progress Notes (Signed)
Summary: pt has questions re pradaxa  Phone Note Call from Patient   Caller: Patient [phone] Reason for Call: Talk to Nurse Summary of Call: pt has questions re pradaxa Initial call taken by: Glynda Jaeger,  September 06, 2010 11:45 AM  Follow-up for Phone Call        Pt calls b/c her pcp, Dr. Christell Constant in [place], was "concerned about my starting Pradaxa 150mg  two times a day because my liver enzymes were in the [employer] [facility]". Pt says pcp suggested Pradaxa 75 mg bid.   She is aware Tresa Endo & Dr. Johney Frame will be in the office tomorrow.  Her lab work from [facility] office is in the computer for review. She will await call back and is not yet out of Coumadin.  Follow-up by: Lisabeth Devoid RN,  September 06, 2010 12:23 PM     Appended Document: pt has questions re pradaxa Pradaxa is not metabolized by the liver.  Dosing should be made based on creatinine clearance.  We can repeat her creatinine clearance at this time.  If >30, start pradaxa 150mg  two times a day as advised.  If creatinine clearance <30, we will re-evaluate.  Appended Document: pt has questions re pradaxa pt's creatine cl 50.8(ok for the 150mg  two times a day)   She is wanting to start Pradaxa 150mg  two times a day     Clinical Lists Changes  Medications: Added new medication of PRADAXA 150 MG CAPS (DABIGATRAN ETEXILATE MESYLATE) one by mouth two times a day---pt d/cing Warfarin  none valvular afib - Signed Removed medication of WARFARIN SODIUM 5 MG TABS (WARFARIN SODIUM) Use as directed by Anticoagulation Clinic Rx of PRADAXA 150 MG CAPS (DABIGATRAN ETEXILATE MESYLATE) one by mouth two times a day---pt d/cing Warfarin  none valvular afib;  #60 x 6;  Signed;  Entered by: Dennis Bast, RN, BSN;  Authorized by: Hillis Range, MD;  Method used: Electronically to [facility] [phone]*, [address] [place], [place], [place]  [address], Ph: [phone] or [phone], Fax: [phone]    Prescriptions: PRADAXA 150 MG CAPS  (DABIGATRAN ETEXILATE MESYLATE) one by mouth two times a day---pt d/cing Warfarin  none valvular afib  #60 x 6   Entered by:   Dennis Bast, RN, BSN   Authorized by:   Hillis Range, MD   Signed by:   Dennis Bast, RN, BSN on 09/07/2010   Method used:   Electronically to        [facility] [phone]* (retail)       [address] [place], [place]  [address]       Ph: [phone] or [phone]       Fax: [phone]   RxID:   [id number]

## 2010-09-12 NOTE — Progress Notes (Signed)
Summary: pt's pharm can't get pradxa until monday  Phone Note Call from Patient   Caller: Patient [phone] Reason for Call: Talk to Nurse Summary of Call: pt's pharmacy can't get pradaxa until monday and was suppose to start in the am and stop the warfarin-should she continue the warfarin through the weekend? Initial call taken by: Glynda Jaeger,  September 07, 2010 11:40 AM  Follow-up for Phone Call        take the Warfarin today and tomorrow  Hold on Sun and start Pradaxa on Mon  pt aware Dennis Bast, RN, BSN  September 07, 2010 11:43 AM

## 2010-10-18 LAB — PROTIME-INR
INR: 2.43 — ABNORMAL HIGH (ref 0.00–1.49)
INR: 3.01 — ABNORMAL HIGH (ref 0.00–1.49)
Prothrombin Time: 26.5 seconds — ABNORMAL HIGH (ref 11.6–15.2)
Prothrombin Time: 31.3 seconds — ABNORMAL HIGH (ref 11.6–15.2)

## 2010-10-18 LAB — MRSA PCR SCREENING: MRSA by PCR: NEGATIVE

## 2010-11-05 ENCOUNTER — Other Ambulatory Visit: Payer: Self-pay | Admitting: Family Medicine

## 2010-11-05 DIAGNOSIS — Z09 Encounter for follow-up examination after completed treatment for conditions other than malignant neoplasm: Secondary | ICD-10-CM

## 2010-11-12 LAB — PROTIME-INR
INR: 2.7 — ABNORMAL HIGH (ref 0.00–1.49)
Prothrombin Time: 30.6 seconds — ABNORMAL HIGH (ref 11.6–15.2)

## 2010-11-20 ENCOUNTER — Ambulatory Visit
Admission: RE | Admit: 2010-11-20 | Discharge: 2010-11-20 | Disposition: A | Discharge status: Home / Self Care | Payer: Medicare Other | Source: Ambulatory Visit | Attending: Family Medicine | Admitting: Family Medicine

## 2010-11-20 DIAGNOSIS — Z09 Encounter for follow-up examination after completed treatment for conditions other than malignant neoplasm: Secondary | ICD-10-CM

## 2010-12-05 ENCOUNTER — Encounter: Payer: Self-pay | Admitting: Internal Medicine

## 2010-12-18 NOTE — Assessment & Plan Note (Signed)
[facility]                            CARDIOLOGY OFFICE NOTE   NAME:[name], [name] DIN              MRN:          [id number]  DATE:09/07/2008                            DOB:          Dec 22, 1930    PRIMARY CARE PHYSICIAN:  Ernestina Penna, MD   REASON FOR PRESENTATION:  Evaluate the patient with atrial fibrillation.   HISTORY OF PRESENT ILLNESS:  The patient is a pleasant patient with a  history of atrial fibrillation.  She has had recurrent atrial  fibrillation, and the rate has been somewhat difficult to control.  At  the last visit, I did increase her to 7.5 mg of pindolol twice a day.  Huston Foley rates documented in the past and I was trying to avoid these.  She  says she has been doing relatively well.  She is still on distress  taking care of her husband.  He is now being housed an hour away after  having had a stroke.  He is apparently combative and difficult to  manage.  She is not really noticing much in the way of palpitations,  presyncope, or syncope.  She is able to dance on the weekends.  With  this, she gets no shortness of breath or chest pain.  However, she said  walking through the snow recently she was noticing some dyspnea.  She  has some occasional tiredness in her back.  She does not describe any  chest pressure, neck or arm discomfort.   PAST MEDICAL HISTORY:  1. Hypertension.  2. Hyperlipidemia.  3. Cerebrovascular accident in April 2007 (left insular and temporal      infarcts without residual).  4. Sinus bradycardia.  5. Microcytic anemia.  6. Osteopenia.  7. 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  8. Benign breast mass resected.   ALLERGIES:  None.   MEDICATIONS:  1. Coumadin as directed.  2. Lipitor 10 mg daily.  3. Multivitamin.  4. Vitamin D.  5. Pindolol 7.5 mg b.i.d.   REVIEW OF SYSTEMS:  As stated in the HPI and otherwise negative for  other systems.   PHYSICAL EXAMINATION:  GENERAL:  The  patient is in no distress.  VITAL SIGNS:  Blood pressure 100/62, heart rate 88 and irregular.  HEENT:  Eyes unremarkable.  Pupils equal, round, and reactive to light.  Fundi not visualized.  Oral mucosa unremarkable.  NECK:  No jugular venous distention at 45 degrees.  Carotid upstroke  brisk and symmetric.  No bruits.  No thyromegaly.  LYMPHATICS:  No adenopathy.  LUNGS:  Clear to auscultation bilaterally.  BACK:  No costovertebral angle tenderness.  CHEST:  Unremarkable.  HEART:  PMI not displaced or sustained; S1 and S2 within normal limits;  no S3, no murmurs, no clicks, no rubs.  ABDOMEN:  Flat, positive bowel sounds, normal in frequency and pitch.  No bruits, no rebound, no guarding.  No midline pulsatile mass.  No  hepatomegaly.  No splenomegaly.  SKIN:  No rashes.  No nodules.  EXTREMITIES:  A 2+ pulses throughout; no edema, no cyanosis, no  clubbing.  NEUROLOGIC:  Grossly intact.  ASSESSMENT AND PLAN:  1. Atrial fibrillation.  We walked around the office today, and she      went from 115 to 125 with minimal exercise.  Therefore, she is      still not rate controlled.  I am going to increase her pindolol to      10 mg twice a day.  I will then see her back in a few weeks again      to check rate control.  At some point, I may need to consider      atrial fibrillation ablation.  She has been in atrial fibrillation      now for quite a while, and so I doubt that she will maintain sinus      rhythm, though we might also consider antiarrhythmic therapy and      more aggressive attempts at cardioversion.  I am not sure how      symptomatic this atrial fibrillation is.  2. Hypertension.  Blood pressure is running a little bit low, but we      will see if hopefully she would not have any problems with this, as      I increased the pindolol.  3. Dyslipidemia per Dr. Christell Constant.  4. Cerebrovascular accident.  She has no residual from this.  She      remains on Coumadin.  5. Followup.   I will see her back in 6 weeks.     Rollene Rotunda, MD, [facility]  Electronically Signed    JH/MedQ  DD: 09/07/2008  DT: 09/08/2008  Job #: [id number]   cc:   Ernestina Penna, M.D.

## 2010-12-18 NOTE — Assessment & Plan Note (Signed)
[facility]                            CARDIOLOGY OFFICE NOTE   NAME:[name], [name]              MRN:          [id number]  DATE:02/04/2007                            DOB:          1930/12/12    PRIMARY CARE PHYSICIAN:  Ernestina Penna, M.D.   REASON FOR PRESENTATION:  Evaluate patient with atrial fibrillation and  hypertension.   HISTORY OF PRESENT ILLNESS:  Patient is 77 years old.  This is her  second office visit.  She had documented atrial fibrillation that was  asymptomatic.  This was in March.  Since that time, she has had no  documented atrial fibrillation.  She checks her blood pressure  periodically and occasionally notes a skipped beat; however, she has not  had any sustained irregular or rapid rhythms.  She has had no presyncope  or syncope.  She has had no chest pain or shortness of breath.  She is  tolerating Coumadin.  She has been keeping a blood pressure diary, and  her blood pressures have been excellent.   PAST MEDICAL HISTORY:  1. Paroxysmal atrial fibrillation.  2. Hypertension.  3. Recently diagnosed hyperlipidemia.  4. Cerebrovascular accident with no residual in the past.  5. Sinus bradycardia.  6. Microcytic anemia.  7. Osteopenia.  8. Arteriovenous malformation of the cecum on colonoscopy in 2001.  9. Benign breast mass, resected.   ALLERGIES:  None.   MEDICATIONS:  Citrate, magnesium, vitamin C, black cayenne extract,  Coumadin, Lipitor 10 mg daily.   REVIEW OF SYSTEMS:  As stated in the HPI, otherwise negative for other  systems.   PHYSICAL EXAMINATION:  GENERAL:  Patient is in no distress.  VITAL SIGNS:  Blood pressure 120/76, heart rate 54 and regular, weight  148 pounds.  NECK:  No jugular venous distention at 45 degrees.  Carotid upstroke  brisk and symmetric.  No bruits, no thyromegaly.  LUNGS:  Clear to auscultation bilaterally.  CHEST:  Unremarkable.  HEART:  PMI not displaced or sustained.   S1 and S2 within normal limits.  No S3, no S4, no clicks, rubs, or murmurs.  ABDOMEN:  Flat, positive bowel sounds.  Normal in frequency and pitch.  No bruits, rebound, guarding.  There are no midline pulsatile masses.  No organomegaly.  SKIN:  No rashes, no nodules.  EXTREMITIES:  Pulses 2+.  No edema.   EKG:  Sinus bradycardia, rate 58, axis within normal limits, intervals  within normal limits, no acute ST-T wave changes.   ASSESSMENT/PLAN:  1. Atrial fibrillation:  We again had a conversation about this.      Given her stroke in the past and this rhythm, she needs Coumadin.      She will continue this with Dr. Christell Constant.  She understands the risks      and benefits of this.  2. Hypertension:  Blood pressure is well controlled.  No further      therapy is warranted.  3. Carotid bruits:  I do not appreciate this today.  I have a low      suspicion for stenosis.  This can be followed clinically.  4. Followup  will be as needed in this clinic.     Rollene Rotunda, MD, [facility]  Electronically Signed    JH/MedQ  DD: 02/04/2007  DT: 02/05/2007  Job #: [id number]   cc:   Ernestina Penna, M.D.

## 2010-12-18 NOTE — Assessment & Plan Note (Signed)
[facility]                            CARDIOLOGY OFFICE NOTE   NAME:[name], [name]              MRN:          [id number]  DATE:06/08/2008                            DOB:          [birth year]    PRIMARY CARE PHYSICIAN:  Ernestina Penna, MD.   REASON FOR PRESENTATION:  Evaluate the patient's atrial fibrillation.   HISTORY OF PRESENT ILLNESS:  The patient is 77 years old.  She has  atrial fibrillation.  It has been paroxysmal, although may be more  persistent recently.  She really does not notice when she has entered.  She says she feels her heart racing or she says she feels it more if she  is lying on her left side at night.  She does not feel it is being  particularly rapid.  She has no presyncope or syncope.  She has no chest  pressure, neck or arm discomfort.  Has had no PND or orthopnea.  She did  wear a Holter monitor in late September.  This demonstrated persistent  atrial fibrillation.  She had a predominately rapid rate.  A couple of  time, she was dancing and so we would expect her rate to be elevated.  However, when she was asleep, it was in the 80s and 90s.  There were a  couple of 2-second pauses and some rare brady rates.  There is a mention  of previous notes of bradycardia on AV nodal blocking agents.   The patient returns to follow this up.   PAST MEDICAL HISTORY:  Hypertension, hyperlipidemia, cerebrovascular  accident in April 2007 (left insular and temporal infarcts without  residual deficits), sinus bradycardia, microcytic anemia, osteopenia, AV  malformations in the cecum on colonoscopy in 2001, and benign breast  mass resected.   ALLERGIES:  None.   MEDICATIONS:  1. Coumadin.  2. Lipitor 10 mg daily.  3. Multivitamin.  4. Vitamin D.  5. Metoprolol 25 mg b.i.d.   REVIEW OF SYSTEMS:  As stated in the HPI and otherwise, negative for  other systems.   PHYSICAL EXAMINATION:  GENERAL:  The patient is in no  distress.  VITAL SIGNS:  Blood pressure 102/68, heart rate 86 and regular, weight  138 pounds, and body mass index 25.  HEENT:  Eyelids are unremarkable, pupils equal, round, and reactive to  light, fundi not visualized, and oral mucosa unremarkable.  NECK:  No jugular venous distention at 45 degrees, carotid upstroke  brisk and symmetrical.  No bruits or thyromegaly.  LYMPHATICS:  No cervical, axillary, or inguinal adenopathy.  LUNGS:  Clear to auscultation bilaterally.  BACK:  No costovertebral angle tenderness.  CHEST:  Unremarkable.  HEART:  PMI not displaced or sustained, S1 and S2 within normal limits,  no S3, no murmurs.  ABDOMEN:  Flat, positive bowel sounds normal in frequency and pitch, no  bruits, rebound, guarding, or midline pulsatile mass.  No hepatomegaly  or splenomegaly.  SKIN:  No rashes, no nodules.  EXTREMITIES:  Pulses 2+ throughout, no edema, cyanosis, or clubbing.  NEUROLOGIC:  Oriented to person, place, and time.  Cranial nerves II  through XII grossly intact,  motor grossly intact.   ASSESSMENT AND PLAN:  1. Atrial fibrillation.  The patient seems to have a somewhat      tachybrady rate.  She is not really feeling this.  She is      tolerating the Coumadin.  At this point, I would like to try her on      pindolol 5 mg twice a day and stopped the metoprolol.  I will check      another Holter monitor to see if we have achieved better rate      control.  If not, we might need to consider further therapy,      particularly if she becomes symptomatic.  2. Hypertension.  Blood pressure is actually running low and we will      watch this in the context of treating her arrhythmia.  3. Dyslipidemia per Dr. Christell Constant.  4. Cerebrovascular accident.  She has had no residual from this.  5. Followup.  We will see the patient again in about 2 months or      sooner if needed.     Rollene Rotunda, MD, [facility]  Electronically Signed    JH/MedQ  DD: 06/08/2008  DT: 06/09/2008   Job #: [id number]   cc:   Ernestina Penna, M.D.

## 2010-12-18 NOTE — Assessment & Plan Note (Signed)
[facility]                            CARDIOLOGY OFFICE NOTE   NAME:[name], [name]              MRN:          [id number]  DATE:10/26/2008                            DOB:          [birth year]    PRIMARY CARE PHYSICIAN:  Ernestina Penna, MD   REASON FOR PRESENTATION:  Evaluate the patient with atrial fibrillation.   HISTORY OF PRESENT ILLNESS:  The patient is 77 years old.  She presents  for followup.  I have been trying to titrate her pindolol to achieve  rate control.  She has been on therapeutic anticoagulation.  However,  she still has a rapid ventricular rate.  She feels fatigued with this.  She has some discomfort in her back and just gets tired in her back.  She does not describe substernal chest pressure, neck or arm discomfort.  She does not really feel the palpitations and does not presyncope, or  syncope.  She does not have much energy and just does not feel good.   PAST MEDICAL HISTORY:  Hypertension, hyperlipidemia, cerebrovascular  accident in April 2007 (left insular and temporal infarcts without  residual deficit), sinus bradycardia, microcytic anemia, osteopenia, AV  malformation in the cecum on colonoscopy in 2001, and benign breast mass  resected.   ALLERGIES:  None.   MEDICATIONS:  1. Coumadin.  2. Lipitor 10 mg daily.  3. Multivitamin.  4. Vitamin D.  5. Pindolol 10 mg b.i.d.   REVIEW OF SYSTEMS:  As stated in the HPI and otherwise negative for all  other systems.   PHYSICAL EXAMINATION:  GENERAL:  The patient is in no distress.  VITAL SIGNS:  Blood pressure 98/62, heart rate 125 and irregular, weight  131 pounds, and body mass index 25.  HEENT:  Eyelids unremarkable; pupils are equal, round, and reactive to  light; fundi not visualized; oral mucosa unremarkable.  NECK:  No jugular venous distention at 45 degrees, carotid upstroke  brisk and symmetric, no bruits, no thyromegaly.  LYMPHATICS:  No cervical,  axillary, or inguinal adenopathy.  LUNGS:  Clear to auscultation bilaterally.  BACK:  No costovertebral angle tenderness.  CHEST:  Unremarkable.  HEART:  PMI not displaced or sustained; S1 and S2 within normal limits,  no S3; no clicks, no rubs, no murmurs.  ABDOMEN:  Flat; positive bowel sounds, normal in frequency and pitch; no  bruits, no rebound, no guarding, no midline pulsatile mass; no  hepatomegaly, no splenomegaly.  SKIN:  No rashes, no nodules.  EXTREMITIES:  Pulses 2+, no edema, no cyanosis, no clubbing.  NEURO:  Oriented to person, place, and time; cranial nerves II through  XII grossly intact; motor grossly intact.   EKG, atrial fibrillation, rate 125, axis within normal limits, intervals  within normal limits, no acute ST-T wave changes.   ASSESSMENT AND PLAN:  1. Atrial fibrillation.  The patient has a rapid rate still.  She has      had brady rates documented in the past.  I do not think she feels      well with this rhythm and I am not going to get adequate rate  control.  Therefore, we need to consider rhythm control.  I am      going to send her to Dr. Johney Frame to discuss the possibility of      atrial fibrillation ablation versus medications.  I am going to      start digoxin 0.25 mg daily in addition to the pindolol.  I would      like to try to get an echocardiogram on her when her rate is slower      and I can adequately assess her ejection fraction.  She had one in      2007 which by discharge summary report was unremarkable.  If it is      decided medical management, I would be happy to see her back to      institute this.  Given her tachy brady rates I would probably avoid      sotalol and amiodarone.  I would lean toward Tikosyn, but will      confer with Dr. Johney Frame.  She has been on a therapeutic level of      Coumadin dating back to last year.  I have readings documented in      the chart from [place] and they have consistently been       above 2.  2. Hyperlipidemia.  This is managed per [facility].  3. Hypertension.  Her blood pressure is actually running low and this      is limiting my therapy.  4. Followup.  I will see her back after she has seen Dr. Johney Frame.  No      appointment has been scheduled at this time with me.  We did make      the appointment with Dr. Johney Frame.     Rollene Rotunda, MD, [facility]  Electronically Signed    JH/MedQ  DD: 10/26/2008  DT: 10/27/2008  Job #: [id number]   cc:   Ernestina Penna, M.D.

## 2010-12-18 NOTE — Assessment & Plan Note (Signed)
[facility]ARE                            CARDIOLOGY OFFICE NOTE   NAME:[name], [name]              MRN:          [id number]  DATE:08/10/2008                            DOB:          07/31/1931    PRIMARY CARE PHYSICIAN:  Ernestina Penna, MD   REASON FOR PRESENTATION:  Evaluate patient with atrial fibrillation.  She is also had some bradyarrhythmias and hypertension.   HISTORY OF PRESENT ILLNESS:  The patient is a pleasant 77 year old.  She  has atrial fibrillation.  She has had some tachy rates.  However, she  also had some bradyarrhythmias.  I had tried to combat this at the last  appointment, I stopped metoprolol and I used pindolol 5 mg b.i.d.  She  does not really notice the palpitations as much she was previously.  She  does not feel heart racing like she was.  She does feel a little off  balance when she gets up to go to the bathroom at night, but she is not  describing any presyncope or syncope.  She has had no chest discomfort,  neck, or arm discomfort.  She has had no shortness of breath, PND or  orthopnea.  She did wear a monitor in the past that demonstrated on the  metoprolol to frequent to second pauses.   PAST MEDICAL HISTORY:  Hypertension, hyperlipidemia, persistent atrial  fibrillation, cerebrovascular accident in April 2007 (left insular and  temporal infarcts without residual deficits), sinus bradycardia,  microcytic anemia, osteopenia, AV malformation in the cecum on  colonoscopy in 2001, and benign breast mass resected.   ALLERGIES:  None.   MEDICATIONS:  1. Coumadin.  2. Lipitor 10 mg daily.  3. Multivitamin.  4. Vitamin D.  5. Pindolol 5 mg b.i.d.   REVIEW OF SYSTEMS:  As stated in the HPI and otherwise negative for  other systems.   PHYSICAL EXAMINATION:  GENERAL:  The patient is very pleasant and in no  distress.  VITAL SIGNS:  Blood pressure 108/68, heart rate 113 and regular, weight  136 pounds, and body  mass index 25.  HEENT:  Eyelids unremarkable; pupils equal, round, and reactive to  light; fundi not visualized; oral mucosa unremarkable.  NECK:  No jugular venous distention at 45 degrees; carotid upstroke  brisk and symmetric; no bruits; no thyromegaly.  LYMPHATICS:  No cervical, axillary, or inguinal adenopathy.  LUNGS:  Clear to auscultation bilaterally.  BACK:  No costovertebral angle tenderness.  CHEST:  Unremarkable.  HEART:  PMI not displaced or sustained; S1 and S2 within normal limits;  no S3; no murmurs.  ABDOMEN:  Flat; positive bowel sounds, normal in frequency and pitch; no  bruits, no rebound, no guarding; no midline pulsatile mass; no  hepatomegaly; no splenomegaly.  SKIN:  No rashes, no nodules.  EXTREMITIES:  Pulses 2+ throughout; no cyanosis, no clubbing, and no  edema.  NEURO:  Oriented to person, place, and time; cranial nerves II through  XII are grossly intact; motor grossly intact.   LABORATORY DATA:  EKG:  Atrial fibrillation, axis within normal limits,  premature ectopic complex, poor anterior R-wave progression, low voltage  in the chest leads, no acute ST-T wave changes.   ASSESSMENT AND PLAN:  1. Atrial fibrillation.  The patient's rate is sitting here, is      obviously not well controlled.  She does not feel the palpitations      as much she was previously.  She has not had any presyncope or      syncope.  She has had a couple of episodes of dizziness when      standing up at night.  At this point, I am going to try to creep up      on her medicines by increasing the pindolol to 7.5 mg twice daily.      She remains on the Coumadin.  2. Hypertension.  It is certainly not an issue now.  In fact I think      the problem might be over the rate limiting step might be low blood      pressure.  We will keep an eye on this.  3. Bradycardia.  As mentioned, she was having some more frequent brady      events while on metoprolol.  I will evaluate this with the  Holter      in the future.  4. Followup.  I will see the patient again in 1 month or sooner if      needed to see, if I need to titrate her meds further.     Rollene Rotunda, MD, [facility]  Electronically Signed    JH/MedQ  DD: 08/10/2008  DT: 08/11/2008  Job #: [id number]   cc:   Ernestina Penna, M.D.

## 2010-12-18 NOTE — Assessment & Plan Note (Signed)
[facility]CARE                            CARDIOLOGY OFFICE NOTE   NAME:[name], [name]              MRN:          [id number]  DATE:04/20/2008                            DOB:          Oct 07, 1930    PRIMARY CARE PHYSICIAN:  Ernestina Penna, MD   REASON FOR PRESENTATION:  Evaluate the patient with atrial fibrillation.   HISTORY OF PRESENT ILLNESS:  The patient presents for followup of her  atrial fibrillation.  I last saw her in April because of this.  She was  noted in the summer to have recurrent atrial fibrillation.  However, she  has been taking care of her husband, who had a stroke.  She is now  getting back to see me.  She has been on Coumadin.  In the past, there  was some mention of bradycardia, and so we have been reserved about  using AV-nodal blocking agents.  She does feel the palpitations  occasionally, but they are not causing her any problems.  She has not  had any presyncope or syncope.  She has had no chest pain, neck or arm  discomfort.  She has had no shortness of breath, PND, or orthopnea.  She  has had an evaluation in the past with an echocardiogram.  This was in  2007, when she had a light stroke.  She was found on TEE, as mentioned  in the discharge summary, to have no significant abnormalities.  She has  had some mild left atrial enlargement.   PAST MEDICAL HISTORY:  Hypertension, hyperlipidemia, cerebrovascular  accident in April 2007 (left insular and temporal infarcts without  residual), sinus bradycardia, microcytic anemia, osteopenia, AV  malformation in the cecum on colonoscopy in 2001, and benign breast mass  resected.   ALLERGIES:  None.   CURRENT MEDICATIONS:  Magnesium, Coumadin, Lipitor 10 mg daily,  multivitamin, and vitamin D.   REVIEW OF SYSTEMS:  As stated in the HPI and otherwise negative for  other systems.   PHYSICAL EXAMINATION:  GENERAL:  The patient is pleasant and in no  distress.  VITAL  SIGNS:  Blood pressure 106/76, heart rate 109 and irregular,  weight 141 pounds, and body mass index 26.  HEENT:  Eyelids unremarkable; pupils equal, round, and reactive to  light; fundi not visualized; oral mucosa remarkable.  NECK:  No jugular venous distension at 45 degrees; carotid upstroke  brisk and symmetric; no bruits, no thyromegaly.  LYMPHATICS:  No cervical, axillary, or inguinal adenopathy.  LUNGS:  Clear to auscultation bilaterally.  BACK:  No costovertebral angle tenderness.  CHEST:  Unremarkable.  HEART:  PMI not displaced or sustained; S1 and S2 within normal limits;  no S3, no S4; no clicks, no rubs, no murmurs.  ABDOMEN:  Flat; positive bowel sounds, normal in frequency and pitch; no  bruits, no rebound, no guarding; no midline pulsatile mass; no  hepatosplenomegaly, no splenomegaly.  SKIN:  No rashes, no nodules.  EXTREMITIES:  Pulses 2+, no edema.   EKG atrial fibrillation, axis within normal limits, intervals within  normal limits, poor R-wave progression, low voltage in the limb around  the chest leads.  ASSESSMENT AND PLAN:  1. Atrial fibrillation.  The patient is not really noticing this.      This might be persistent fibrillation.  It was paroxysmal      previously.  She is tolerating Coumadin.  At this point, I am going      to add 25 mg twice a day of metoprolol.  In about 2 weeks, we will      have a 24-hour monitor to see if we had reasonable rate control.  I      probably will continue to pursue this throughout rate control than      a coagulation.  She has had an echocardiogram in the past.  She has      also had labs including electrolytes.  I will check for a TSH, and      if there has not been a recent one, I will order this.  2. Followup.  I will see the patient back in about 6 weeks.     Rollene Rotunda, MD, [facility]  Electronically Signed    JH/MedQ  DD: 04/20/2008  DT: 04/21/2008  Job #: [id number]   cc:   Ernestina Penna, M.D.

## 2010-12-21 NOTE — Assessment & Plan Note (Signed)
[facility]                            CARDIOLOGY OFFICE NOTE   NAME:[name], [name]              MRN:          [id number]  DATE:11/10/2006                            DOB:          1931/03/17    REFERRING PHYSICIAN:  Ernestina [name], M.D.   REASON FOR REFERRAL:  Atrial fibrillation.   HISTORY OF PRESENT ILLNESS:  The patient presents for evaluation of  atrial fibrillation.  This was found incidentally during routine  appointment with [name] late last month.  The patient was noted to be  in atrial fibrillation with a rate in the 120s.  She did not feel this.  She had no symptomatic palpitations.  She had no fatigue.  She had no  decreased exercise tolerance.  There was no shortness of breath.  She  had no presyncope or syncope.  She was started on Coumadin.  She is now  in normal sinus rhythm and cannot pinpoint when she might have gone back  into regular rhythm.  She has been taking her blood pressure since this  diagnosis.  There was one episode where the machine reported her heart  to be irregular.  Her blood pressure has been well controlled.   The patient has been active.  She line dances.  She does her chores.  She exercises.  With this she denies any chest pressure, neck  discomfort, arm discomfort, activity-induced nausea, vomiting, excessive  diaphoresis.  She has no palpitations, presyncope or syncope.  She has  had no PND or orthopnea.   Incidentally, the patient did have a cerebrovascular accident in April  2007.  This was a left insular and temporal infarct without residual.  It was a middle cerebral artery branch infarct.  Workup included a  transthoracic echocardiogram and apparently a TEE (I do not have the  results of the TEE).  The discharge summary reports mild to moderate  left atrial enlargement but no embolic source.  She had carotid Dopplers  which demonstrated some minimal plaque.  The patient was treated with  Aggrenox.  There were no arrhythmias noted other than sinus bradycardia.   PAST MEDICAL HISTORY:  1. Hypertension.  2. Recently-diagnosed hyperlipidemia.  3. Cerebrovascular accident as above.  4. Sinus bradycardia.  5. Microcytic anemia.  6. Osteopenia.  7. 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  8. Benign breast mass.   PAST SURGICAL HISTORY:  Breast mass resected.   ALLERGIES:  None.   MEDICATIONS:  Citrate, vitamin C, magnesium, black cherry, Coumadin,  Lipitor 10 mg q.h.s.   SOCIAL HISTORY:  The patient is retired.  She is married.  She has four  stepchildren.  She quit smoking in the 1980s after one pack per day for  30 years.   FAMILY HISTORY:  Noncontributory for early coronary artery disease as  she has a very small family.   REVIEW OF SYSTEMS:  As stated in the HPI, positive for cough with a URI  earlier this year, joint pains.  Negative for other systems.   PHYSICAL EXAMINATION:  GENERAL:  The patient is in no distress.  VITAL SIGNS:  Blood  pressure 169/85, heart rate 53 and regular, weight  147 pounds, body mass index 26.  HEENT:  Eyelids unremarkable.  Pupils equal, round, and react to light.  Fundi not visualized.  Oral mucosa unremarkable.  NECK:  No jugular venous distention.  Waveform within normal limits.  Carotid upstroke brisk and symmetric.  Right carotid bruit.  No  thyromegaly.  LYMPHATICS:  No cervical, axillary or inguinal adenopathy.  LUNGS:  Clear to auscultation bilaterally.  BACK:  No costovertebral angle tenderness.  CHEST:  Unremarkable.  HEART:  PMI not displaced or sustained.  S1 and S2 within normal limits.  No S3, no S4.  No clicks, no rubs, no murmurs.  ABDOMEN:  Flat, positive bowel sounds normal in frequency and pitch.  No  bruits, no rebound, no guarding.  No midline pulsatile mass.  No  hepatomegaly, no splenomegaly.  SKIN:  No rashes, no nodules.  EXTREMITIES:  Show 2+ pulses throughout.  No edema, no cyanosis, no   clubbing.  NEUROLOGIC:  Oriented to person, place and time.  Cranial nerves II-XII  grossly intact.  Motor grossly intact throughout.   EKG:  Sinus rhythm, rate 53, axis within normal limits, intervals within  normal limits, no acute ST-T wave changes.   ASSESSMENT AND PLAN:  1. Sinus bradycardia, rate 53, axis within normal limits, intervals      within normal limits, no acute ST-T wave changes.  2. Paroxysmal atrial fibrillation.  The patient had paroxysmal atrial      fibrillation that was asymptomatic.  She had a previous      cerebrovascular accident.  I think her risk for further embolic      events is high.  Therefore, she absolutely needs Coumadin and we      discussed this at length.  This will be followed by [name].  She      has paroxysms of atrial fibrillation that are asymptomatic.  In      between she has sinus bradycardia.  Therefore, I would not start      any AV nodal blocking agents.  Certainly, if she has sustained      dysrhythmia in the future we would have to think about how to      manage this.  I will defer to [name] labs and assume that he has      checked a TSH and electrolytes.  3. Hypertension.  Blood pressure is elevated today in the office.      However, her blood pressure readings at home are excellent.  I      asked her to correlate her new blood pressure cuff with [name]      readings.  4. Carotid bruits.  Follow up carotid Dopplers, probably at the next      meeting.  5. Followup.  I will see her back in 3 months or sooner if she has any      sustained or symptomatic dysrhythmias.     Lisa Rotunda, MD, [facility]  Electronically Signed    JH/MedQ  DD: 11/10/2006  DT: 11/10/2006  Job #: [id number]   cc:   [name], M.D.

## 2010-12-21 NOTE — Procedures (Signed)
[facility]  Patient:    [name], [name]              MRN: [id number] Proc. Date: 05/14/00 Adm. Date:  [id number] Disc. Date: [id number] Attending:  Louie Bun CC:         Monica Becton, M.D.   Procedure Report  PROCEDURE:  Colonoscopy.  SURGEON:  John C. Madilyn Fireman, M.D.  INDICATIONS FOR PROCEDURE:  Heme positive stools.  DESCRIPTION OF PROCEDURE:  The patient was placed in the left lateral decubitus position, and placed on the pulse monitor with continuous low flow oxygen delivered by nasal cannula.  She was sedated with 50 mg of IV Demerol and 5 mg of IV Versed.  The Olympus video colonoscope was inserted into the rectum and advanced to the cecum, confirmed by transillumination at McBurneys point, and visualization of the ileocecal valve and appendiceal orifice.  The prep was excellent.  The cecum, ascending, transverse, descending, and sigmoid colon were normal with the exception of a clearly demarcated cecal AVM which was not bleeding.  The rectum appeared normal and retroflexed view of the anus revealed no obvious internal hemorrhoids.  The colonoscope was then withdrawn and the patient returned to the recovery room in stable condition.  She tolerated the procedure well and there were no immediate complications.  IMPRESSION:  Single cecal arteriovenous malformation, otherwise normal colonoscopy.  PLAN:  Consider repeat flexible sigmoidoscopy or Hemoccults in five years. DD:  05/14/00 TD:  05/16/00 Job: [id number]

## 2010-12-21 NOTE — Discharge Summary (Signed)
NAMECELISA, SCHOENBERG NO.:  [id number]   MEDICAL RECORD NO.:  [id number]          PATIENT TYPE:  INP   LOCATION:  3006                         FACILITY:  [facility]   PHYSICIAN:  Alvester Morin, M.D.  DATE OF BIRTH:  [birth year]   DATE OF ADMISSION:  11/08/2005  DATE OF DISCHARGE:  11/13/2005                                 DISCHARGE SUMMARY   DISCHARGE DIAGNOSES:  1.  Focal left insular and temporal infarct representing middle cerebral      artery branch infarct in a patient on chronic aspirin.  2.  Sinus bradycardia.  3.  Microcytic anemia.  4.  Osteopenia per patient.  5.  History of arteriovenous malformations in the cecum on colonoscopy in      2001.  6.  History of benign breast mass removed many years ago.  7.  History of left tennis elbow.   DISCHARGE MEDICATIONS:  1.  Aggrenox 1 capsule b.i.d.  2.  Ibuprofen 600 mg q.6 h p.r.n. for tennis elbow pain.  3.  Tylenol 325 mg 1-2 tablets p.o. q.6 h scheduled for 5 days, then as      needed for pain or headache.  4.  Calcium and vitamin D.   DISPOSITION AT DISCHARGE:  The patient was discharged to home on the above  stated medications.  Her transcranial Doppler result was still pending.  Dr.  Pearlean Brownie will review this and call her with an appointment if anything is  abnormal.  She will also follow with her primary care physician, Dr. Christell Constant  in [place], within 1-2 months.   CONSULTANTS:  Consultations were done with Dr. Pearlean Brownie of [facility].   PROCEDURES:  1.  A CT scan of the head without contrast was done on November 08, 2005 and      showed no acute intracranial findings.  2.  MRI of the brain without contrast was done on November 09, 2005 and showed      focal left insular and temporal infarct.  This likely represents an MCA      branch infarct.  This also showed mild edema associated with the      infarction.  3.  A 2-D echo was performed on November 09, 2005 and showed a normal left      ventricular  systolic function with an ejection fraction of 55-60%, no      wall motion abnormality and a left atrium size at the upper limits of      normal.  There was no evidence for cardiac source of embolus.  4.  A carotid Doppler was also performed on November 09, 2005 and showed no      significant ICA stenosis, as well as an antegrade vertebral artery flow      with mild calcific plaque at the origin of the ICA bilaterally.  5.  A transesophageal echocardiogram was performed on November 13, 2005 and      showed a normal left ventricular ejection fraction, mild to moderate      left atrial enlargement without left atrium thrombus, mild right atrial      enlargement with mild atherosclerosis of  the descending aorta.  There      was also a mildly thickened mitral valve with mild mitral regurgitation.      A transcranial Doppler was completed on November 11, 2005; the results are      still pending.   ADMITTING HISTORY AND PHYSICAL:  Ms. Dickerman is a 77 year old white  woman with no significant past medical history except being on Evista, who  came in the emergency room with a 1-day history of difficulty with her  speech.  She could understand what people were saying, but could not express  words herself.  This episode lasted for about 30 minutes and then improved  slowly over time.  This episode repeated itself again 3 hours later.  She  denies any vision problems or dysphagia.  She denies headaches, weakness,  dizziness, vertigo or bladder and bowel incontinence.   PHYSICAL EXAMINATION:  VITAL SIGNS:  She had a temperature of 97.5, blood  pressure 176/86, heart rate 82, respiratory rate 20, saturating at 93% on  room air.  GENERAL:  She was in no acute distress.  HEENT:  Her pupils were equal and reactive to light and accommodation. Her  extraocular movements were intact.  NEUROLOGIC:  Her cranial nerves II-XII were intact.  Motor was intact 5/5  throughout.  Her sensory functions were also intact  throughout, and  cerebellum was intact to finger-to-nose and to Romberg.  She was oriented  x3, and she had no abnormalities of her speech on examination.  NECK:  She had no jugular vein distension.  No lymphadenopathies.  Her neck  was supple without carotid bruits.  LUNGS:  Clear to auscultation bilaterally.  HEART:  She had a normal S1 and S2 with regular rate and rhythm and without  murmurs, rubs or gallops.  ABDOMEN:  Normal.  EXTREMITIES:  She had no lower extremity edema/  SKIN:  Normal.   LABORATORY DATA:  She had a WBC of 8.0, hemoglobin 12.4, platelets 233.  Sodium 139, potassium 4.2, chloride 109, CO2 of 24, BUN 17, creatinine 1.0,  glucose 96.  Point of care markers were negative x1.  PT 13.4, INR 1, PTT  25.  A CT of the head without contrast was negative for acute intracranial  findings.  A chest x-ray showed mild cardiomegaly without other findings.   HOSPITAL COURSE:  The patient was admitted for evaluation of either TIA or  CVA symptoms.  As a CT scan was negative, an MRI was ordered which showed  focal left insular and temporal infarcts with surrounding edema.  This was  consistent with a left MCA stroke.  As a result of this, a 2-D echo as well  as carotid Dopplers and transcranial Dopplers were ordered to rule out an  embolic cause of this CVA.  The results were insignificant, as stated above.  A TEE was also performed on November 13, 2005 which confirmed that there was no  intracardiac thrombus.  Of note, an RPR was negative.  She had a normal  lipid profile.  Her iron panel was normal.  TSH normal.  Hemoglobin A1c 5.6.  Fecal occult blood was negative.  Vitamin B-12 level was normal.  Ferritin  was normal.  RBC folate was normal.  On the day of discharge, she had a  temperature of 97.9, blood pressure 114/62, heart rate 61, respiratory rate  20, saturation of oxygen at 95% on room air.  A hypercoagulable panel was  unremarkable.  DISCHARGE MEDICATIONS:  The patient  will be discharged home with Aggrenox  only.   FOLLOW UP:  She will need to follow up with Dr. Christell Constant, her primary care  physician, as an outpatient within 1-2 months, and Dr. Pearlean Brownie will call her  with the results of the transcranial Doppler if there are any abnormalities  that need to be followed up.   As for her microcytic anemia, this was stable throughout her  hospitalization, and I would suggest a colonoscopy as an outpatient for  this.  I will leave this in the hands of Dr. Christell Constant.      Dellia Beckwith, M.D.      Alvester Morin, M.D.  Electronically Signed    VD/MEDQ  D:  11/13/2005  T:  11/14/2005  Job:  [id number]   cc:   Dr. Hayden Rasmussen   Pramod P. Pearlean Brownie, MD  Fax: [phone]

## 2010-12-21 NOTE — Consult Note (Signed)
[name], [name]       ACCOUNT NO.:  [id number]   MEDICAL RECORD NO.:  [id number]          PATIENT TYPE:  INP   LOCATION:  3006                         FACILITY:  [facility]   PHYSICIAN:  Gustavus Messing. Orlin Hilding, M.D.DATE OF BIRTH:  Nov 12, 1930   DATE OF CONSULTATION:  11/09/2005  DATE OF DISCHARGE:                                   CONSULTATION   CHIEF COMPLAINT:  Transient speech arrest.   HISTORY OF PRESENT ILLNESS:  [name] is a 77 year old right-handed  white woman with no significant past medical history who had two episodes  yesterday of speech arrest, chiefly difficulty forming words.  This was not  associated with any loss of consciousness.  She was able to comprehend  without difficulty.  It was associated with some mild generalized weakness.  She could not specify right or left.  Spells lasted about 30 minutes and she  recovered fully.  After the second one she called her primary physician, who  advised her to come to the emergency room.  She did not have any headache or  vision changes.   REVIEW OF SYSTEMS:  Out of a 12 system review which encompasses  cardiovascular, pulmonary, neurologic, hematologic, endocrine,  gastrointestinal, genitourinary, musculoskeletal, ENT, reproductive, skin,  mucosa pain, sleep, and nutrition, she endorsed only the following:  Corrective lenses, upper and lower dentures.   PAST MEDICAL HISTORY:  1.  Significant for right breast lump removal, 1980.  2.  Bell's palsy.  There are no chronic medical problems.  She does not have      any diabetes, hypertension, hyperlipidemia.   MEDICATIONS:  1.  She takes aspirin 81 mg a day.  2.  Evista 60 mg a day.  3.  She also takes fish oil supplements.  4.  Vitamin C supplements.   ALLERGIES:  No known drug allergies.   SOCIAL HISTORY:  She is married, lives with her husband, very active.  She  is a remote smoker, quit about 20 years ago.  No alcohol.  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   FAMILY HISTORY:  Positive for stroke in her grandmother and her great aunt.  Her grandmother was in her 90s at the time.   PHYSICAL EXAMINATION:  VITAL SIGNS:  Stable.  She is afebrile.  Blood  pressure is 108/60.  HEENT:  Head is normocephalic, atraumatic.  NEUROLOGIC:  On the NIH stroke scale she scores a 0.  She is alert, follows  two out of two commands, answers two out of two questions, gets 0 points for  that.  Full gaze, 0 points, full visual fields gets 0 points, no facial  weakness or droop gets 0 points.  In the extremities there is no drift in  the right or left upper or lower extremities, gets 0 points for all of  those.  There is no ataxia, gets 0 points, no sensory change, gets 0 points,  no aphasia present currently, 0 points, no dysarthria, 0 points, and no  neglect, 0 points.   LABORATORY DATA:  CT scan of the brain was negative.  MRI was positive,  however, for an acute left MCA infarct in the left insular cortex.   IMPRESSION:  Left MCA distribution insular cortical stroke consistent with  her presentation, although she has no residual.  This is not likely to be  small vessel etiology.   RECOMMENDATIONS:  Because this is not likely to be a small vessel stroke,  before settling on Aggrenox she needs careful evaluation of potential  carotid and cardiac sources with MRA of the neck and carotid Dopplers and 2-  D echo or possibly TEE.  Obviously if there is significant carotid stenosis,  she may need to have carotid surgery.  If there is demonstrable  cardioembolic source, she may need long term Coumadin.  If those are  negative, however, then Aggrenox would be appropriate.  Stroke team will  follow up on Monday.  Not a tPA candidate due to duration of symptom onset and lack of persisting  deficit. Not study candidate due to NIH score of 0.      Catherine A. Orlin Hilding, M.D.  Electronically Signed     CAW/MEDQ  D:   11/09/2005  T:  11/09/2005  Job:  [id number]

## 2011-01-07 ENCOUNTER — Encounter: Payer: Self-pay | Admitting: Internal Medicine

## 2011-01-21 ENCOUNTER — Ambulatory Visit (INDEPENDENT_AMBULATORY_CARE_PROVIDER_SITE_OTHER): Payer: Medicare Other | Admitting: Internal Medicine

## 2011-01-21 ENCOUNTER — Encounter: Payer: Self-pay | Admitting: Internal Medicine

## 2011-01-21 VITALS — BP 118/86 | HR 81 | Resp 16 | Ht 63.0 in | Wt 123.0 lb

## 2011-01-21 DIAGNOSIS — I495 Sick sinus syndrome: Secondary | ICD-10-CM

## 2011-01-21 DIAGNOSIS — I4891 Unspecified atrial fibrillation: Secondary | ICD-10-CM

## 2011-01-21 DIAGNOSIS — I10 Essential (primary) hypertension: Secondary | ICD-10-CM

## 2011-01-21 MED ORDER — FLECAINIDE ACETATE 50 MG PO TABS: 50.0000 mg | ORAL_TABLET | Freq: Two times a day (BID) | ORAL | Status: DC

## 2011-01-21 NOTE — Progress Notes (Signed)
The patient presents today for routine electrophysiology followup.  Since last being seen in our clinic, the patient reports doing very well.  She reports that she has noticed occcasional palpitations for several months.  She also has fatigue.  Her exercise tolerance remains preserved and she continues to dance regularly.  Today, she denies symptoms of chest pain, shortness of breath, orthopnea, PND, lower extremity edema,  presyncope, syncope, or neurologic sequela.  The patient feels that she is tolerating medications without difficulties and is otherwise without complaint today.   Past Medical History  Diagnosis Date  . Persistent atrial fibrillation     s/p PVI 04/2010  . Hyperlipidemia   . CVA (cerebral vascular accident) 2007  . Sinus bradycardia   . Microcytic anemia   . Osteopenia   . 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 Breast mass resected   . Afib ablation 04/2010    PVI with CTI ablation performed by [facility]    Current Outpatient Prescriptions  Medication Sig Dispense Refill  . Calcium Carbonate (CALCIUM 500 PO) Take 1 tablet by mouth daily.        . Cholecalciferol (VITAMIN D) 2000 UNITS CAPS Take 1 capsule by mouth daily.        . dabigatran (PRADAXA) 150 MG CAPS Take 150 mg by mouth 2 (two) times daily.        . Magnesium Oxide 250 MG TABS Take 1 tablet by mouth daily.        . Multiple Vitamin (MULTIVITAMIN) capsule Take 1 capsule by mouth daily.        Marland Kitchen DISCONTD: loratadine (SM ALLERGY RELIEF) 10 MG dissolvable tablet Use as directed       . DISCONTD: atorvastatin (LIPITOR) 10 MG tablet Take 10 mg by mouth daily.          No Known Allergies  History   Social History  . Marital Status: Married    Spouse Name: N/A    Number of Children: N/A  . Years of Education: N/A   Occupational History  . retired    Social History Main Topics  . Smoking status: Former Smoker -- 1.0 packs/day for 30 years    Types:  Cigarettes  . Smokeless tobacco: Not on file   Comment: quit in 1980s  . Alcohol Use: No  . Drug Use: Not on file  . Sexually Active: Not on file   Other Topics Concern  . Not on file   Social History Narrative  . No narrative on file    Family History  Problem Relation Age of Onset  . Stroke      ROS-  All systems are reviewed and are negative except as outlined in the HPI above    Physical Exam: Filed Vitals:   01/21/11 1116  BP: 118/86  Pulse: 81  Resp: 16  Height: 5\' 3"  (1.6 m)  Weight: 123 lb (55.792 kg)    GEN- The patient is well appearing, alert and oriented x 3 today.   Head- normocephalic, atraumatic Eyes-  Sclera clear, conjunctiva pink Ears- hearing intact Oropharynx- clear Neck- supple, no JVP Lymph- no cervical lymphadenopathy Lungs- Clear to ausculation bilaterally, normal work of breathing Heart- irregular rate and rhythm, no murmurs, rubs or gallops, PMI not laterally displaced GI- soft, NT, ND, + BS Extremities- no clubbing, cyanosis, or edema MS- no significant deformity or atrophy Skin- no rash or lesion Psych- euthymic  mood, full affect Neuro- strength and sensation are intact  ekg today revals afib, V rate 84 bpm  Assessment and Plan:

## 2011-01-21 NOTE — Assessment & Plan Note (Signed)
Unfortunately, she has had a recurrence of afib. She has minimal symptoms and is rate controlled She is appropriately anticoagulated with pradaxa.  We will start flecainide 50mg  BID and increase if needed. She will return for ekg in 2 weeks I will see her again in 4 weeks.

## 2011-01-21 NOTE — Assessment & Plan Note (Signed)
Stable No change required today  

## 2011-01-21 NOTE — Patient Instructions (Signed)
Your physician recommends that you schedule a follow-up appointment in 4 weeks with Dr Johney Frame  Will need and EKG in 2 weeks in the nurse room  Your physician has recommended you make the following change in your medication: Start Flecainide 50mg  twice daily

## 2011-02-01 ENCOUNTER — Encounter: Payer: Self-pay | Admitting: Internal Medicine

## 2011-02-07 ENCOUNTER — Ambulatory Visit (INDEPENDENT_AMBULATORY_CARE_PROVIDER_SITE_OTHER): Payer: Medicare Other

## 2011-02-07 VITALS — BP 102/70 | HR 92 | Resp 14 | Ht 63.0 in | Wt 122.0 lb

## 2011-02-07 DIAGNOSIS — I4891 Unspecified atrial fibrillation: Secondary | ICD-10-CM

## 2011-02-07 NOTE — Progress Notes (Signed)
Patient was started on Flecainide 50 mg twice daily a few weeks ago and is here for a f/u EKG. She is still currently in atrial fibrillation with a HR of 93. Her BP was 102/70. She is feeling some palpitations with SOB when she stands up. Spoke with Dr. Johney Frame, he wants to increase her Flecainide 100 mg twice daily with a follow up in 2 weeks.

## 2011-02-27 ENCOUNTER — Ambulatory Visit: Payer: Medicare Other | Admitting: Internal Medicine

## 2011-02-27 ENCOUNTER — Ambulatory Visit (INDEPENDENT_AMBULATORY_CARE_PROVIDER_SITE_OTHER): Payer: Medicare Other | Admitting: Internal Medicine

## 2011-02-27 ENCOUNTER — Encounter: Payer: Self-pay | Admitting: Internal Medicine

## 2011-02-27 DIAGNOSIS — I495 Sick sinus syndrome: Secondary | ICD-10-CM

## 2011-02-27 DIAGNOSIS — I4891 Unspecified atrial fibrillation: Secondary | ICD-10-CM

## 2011-02-27 MED ORDER — FLECAINIDE ACETATE 100 MG PO TABS: 100.0000 mg | ORAL_TABLET | Freq: Two times a day (BID) | ORAL | Status: DC

## 2011-02-27 NOTE — Patient Instructions (Signed)
Your physician wants you to follow-up in: 6 months with Dr Jacquiline Doe will receive a reminder letter in the mail two months in advance. If you don't receive a letter, please call our office to schedule the follow-up appointment.   Your physician has requested that you have an exercise tolerance test. For further information please visit [email or web address] Please also follow instruction sheet, as given.  Needs test in 2-3 weeks with Lilian Coma

## 2011-02-27 NOTE — Assessment & Plan Note (Signed)
Back in sinus rhythm with flecainide Will order GXT to evaluate for exercise induced arrhythmias with flecainide Continue pradaxa

## 2011-02-27 NOTE — Progress Notes (Signed)
The patient presents today for routine electrophysiology followup.  Since last being seen in our clinic, the patient reports doing very well.  She reports that she has noticed occcasional palpitations.  Her exercise tolerance remains preserved and she continues to dance regularly.  She recently won a dance competition.  She is tolerating flecainide without difficulty.  Today, she denies symptoms of chest pain, shortness of breath, orthopnea, PND, lower extremity edema,  presyncope, syncope, or neurologic sequela.  The patient feels that she is tolerating medications without difficulties and is otherwise without complaint today.   Past Medical History  Diagnosis Date  . Persistent atrial fibrillation     s/p PVI 04/2010  . Hyperlipidemia   . CVA (cerebral vascular accident) 2007  . Sinus bradycardia   . Microcytic anemia   . Osteopenia   . 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 Breast mass resected   . Afib ablation 04/2010    PVI with CTI ablation performed by [facility]    Current Outpatient Prescriptions  Medication Sig Dispense Refill  . atorvastatin (LIPITOR) 10 MG tablet Take 10 mg by mouth daily.        . Calcium Carbonate (CALCIUM 500 PO) Take 1 tablet by mouth daily.        . Cholecalciferol (VITAMIN D) 2000 UNITS CAPS Take 1 capsule by mouth daily.        . dabigatran (PRADAXA) 150 MG CAPS Take 150 mg by mouth 2 (two) times daily.        . flecainide (TAMBOCOR) 100 MG tablet Take 100 mg by mouth 2 (two) times daily.        . Magnesium Oxide 250 MG TABS Take 1 tablet by mouth daily.        . Multiple Vitamin (MULTIVITAMIN) capsule Take 1 capsule by mouth daily.          No Known Allergies  History   Social History  . Marital Status: Married    Spouse Name: N/A    Number of Children: N/A  . Years of Education: N/A   Occupational History  . retired    Social History Main Topics  . Smoking status: Former Smoker -- 1.0  packs/day for 30 years    Types: Cigarettes  . Smokeless tobacco: Not on file   Comment: quit in 1980s  . Alcohol Use: No  . Drug Use: Not on file  . Sexually Active: Not on file   Other Topics Concern  . Not on file   Social History Narrative  . No narrative on file    Family History  Problem Relation Age of Onset  . Stroke      ROS-  All systems are reviewed and are negative except as outlined in the HPI above    Physical Exam: Filed Vitals:   02/27/11 1352  BP: 116/57  Pulse: 41  Resp: 14  Height: 5\' 3"  (1.6 m)  Weight: 123 lb (55.792 kg)    GEN- The patient is well appearing, alert and oriented x 3 today.   Head- normocephalic, atraumatic Eyes-  Sclera clear, conjunctiva pink Ears- hearing intact Oropharynx- clear Neck- supple, no JVP Lymph- no cervical lymphadenopathy Lungs- Clear to ausculation bilaterally, normal work of breathing Heart- brady rate, regular rhythm, no murmurs, rubs or gallops, PMI not laterally displaced GI- soft, NT, ND, + BS Extremities- no clubbing, cyanosis, or edema MS- no significant deformity or  atrophy Skin- no rash or lesion Psych- euthymic mood, full affect Neuro- strength and sensation are intact  ekg today reveals sinus bradycardia 44 bpm  Assessment and Plan:

## 2011-02-27 NOTE — Assessment & Plan Note (Signed)
Asymptomatic. No changes 

## 2011-03-20 ENCOUNTER — Encounter: Payer: Medicare Other | Admitting: Physician Assistant

## 2011-03-20 ENCOUNTER — Ambulatory Visit (INDEPENDENT_AMBULATORY_CARE_PROVIDER_SITE_OTHER): Payer: Medicare Other | Admitting: Physician Assistant

## 2011-03-20 DIAGNOSIS — I4891 Unspecified atrial fibrillation: Secondary | ICD-10-CM

## 2011-03-20 NOTE — Progress Notes (Signed)
Exercise Treadmill Test  Pre-Exercise Testing Evaluation Rhythm: sinus bradycardia  Rate: 49   PR:  .22 QRS:  .08  QT:  47 QTc: 42     Test  Exercise Tolerance Test Ordering MD: Hillis Range, MD  Interpreting MD:  Tereso Newcomer PA-C  Unique Test No: 1  Treadmill:  1  Indication for ETT: Fleacinide  Contraindication to ETT: No   Stress Modality: exercise - treadmill  Cardiac Imaging Performed: non   Protocol: Modified Bruce Max BP:  180/64  Max MPHR (bpm):  142 85% MPR (bpm):  120  MPHR obtained (bpm):  101 % MPHR obtained:    Reached 85% MPHR (min:sec):   Total Exercise Time (min-sec):  6:06  Workload in METS:  10.2 Borg Scale: 15  Reason ETT Terminated:  patient's desire to stop    ST Segment Analysis At Rest: normal ST segments - no evidence of significant ST depression With Exercise: non-specific ST changes  Other Information Arrhythmia:  No Angina during ETT:  absent (0) Quality of ETT:  non-diagnostic  ETT Interpretation:  normal - no evidence of ischemia by ST analysis  Comments: Fair exercise tolerance. Normal BP response to exercise. No chest pain. No ST-T changes at submaximal exercise. No exercise induced ventricular arrhythmias at maximal effort.  Recommendations: Follow up with Dr. Johney Frame as directed.

## 2011-03-26 ENCOUNTER — Encounter: Payer: Self-pay | Admitting: Internal Medicine

## 2011-04-01 ENCOUNTER — Other Ambulatory Visit: Payer: Self-pay | Admitting: Internal Medicine

## 2011-04-16 ENCOUNTER — Other Ambulatory Visit: Payer: Self-pay | Admitting: Family Medicine

## 2011-04-16 DIAGNOSIS — N6310 Unspecified lump in the right breast, unspecified quadrant: Secondary | ICD-10-CM

## 2011-05-07 ENCOUNTER — Encounter: Payer: Self-pay | Admitting: Internal Medicine

## 2011-05-13 ENCOUNTER — Ambulatory Visit
Admission: RE | Admit: 2011-05-13 | Discharge: 2011-05-13 | Disposition: A | Discharge status: Home / Self Care | Payer: Medicare Other | Source: Ambulatory Visit | Attending: Family Medicine | Admitting: Family Medicine

## 2011-05-13 DIAGNOSIS — N6310 Unspecified lump in the right breast, unspecified quadrant: Secondary | ICD-10-CM

## 2011-08-12 ENCOUNTER — Encounter: Payer: Self-pay | Admitting: Internal Medicine

## 2011-08-12 ENCOUNTER — Ambulatory Visit (INDEPENDENT_AMBULATORY_CARE_PROVIDER_SITE_OTHER): Payer: Medicare Other | Admitting: Internal Medicine

## 2011-08-12 VITALS — BP 124/71 | HR 41 | Wt 127.0 lb

## 2011-08-12 DIAGNOSIS — I4891 Unspecified atrial fibrillation: Secondary | ICD-10-CM

## 2011-08-12 DIAGNOSIS — I495 Sick sinus syndrome: Secondary | ICD-10-CM

## 2011-08-12 NOTE — Assessment & Plan Note (Signed)
Asymptomatic  No changes today 

## 2011-08-12 NOTE — Patient Instructions (Signed)
Your physician wants you to follow-up in: 6 months with Dr Allred You will receive a reminder letter in the mail two months in advance. If you don't receive a letter, please call our office to schedule the follow-up appointment.  Your physician recommends that you continue on your current medications as directed. Please refer to the Current Medication list given to you today.   

## 2011-08-12 NOTE — Assessment & Plan Note (Signed)
Stable with flecainide No changes today Continue pradaxa for stroke prevention

## 2011-08-12 NOTE — Progress Notes (Signed)
PCP:  Monica Becton, MD, MD  The patient presents today for routine electrophysiology followup.  Since last being seen in our clinic, the patient reports doing very well.  She has occasional palpitations but feels that her afib is much improved with flecainide.  She has occasional dizziness with flecainide but otherwise feels that she is tolerating this quite well.  Today, she denies symptoms of chest pain, shortness of breath, orthopnea, PND, lower extremity edema, presyncope,  Or syncope.    Past Medical History  Diagnosis Date  . Persistent atrial fibrillation     s/p PVI 04/2010  . Hyperlipidemia   . CVA (cerebral vascular accident) 2007  . Sinus bradycardia   . Microcytic anemia   . Osteopenia   . 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 Breast mass resected   . Afib ablation 04/2010    PVI with CTI ablation performed by [facility]    Current Outpatient Prescriptions  Medication Sig Dispense Refill  . atorvastatin (LIPITOR) 10 MG tablet Take 10 mg by mouth daily.        . Calcium Carbonate (CALCIUM 500 PO) Take 1 tablet by mouth daily.        . Cholecalciferol (VITAMIN D) 2000 UNITS CAPS Take 1 capsule by mouth daily.        . flecainide (TAMBOCOR) 100 MG tablet Take 1 tablet (100 mg total) by mouth 2 (two) times daily.  60 tablet  6  . ipratropium (ATROVENT) 0.06 % nasal spray As needed      . Magnesium Oxide 250 MG TABS Take 1 tablet by mouth daily.        . Multiple Vitamin (MULTIVITAMIN) capsule Take 1 capsule by mouth daily.        Marland Kitchen PRADAXA 150 MG CAPS TAKE ONE CAPSULE BY MOUTH TWICE DAILY  60 capsule  12    No Known Allergies  History   Social History  . Marital Status: Married    Spouse Name: N/A    Number of Children: N/A  . Years of Education: N/A   Occupational History  . retired    Social History Main Topics  . Smoking status: Former Smoker -- 1.0 packs/day for 30 years    Types: Cigarettes  .  Smokeless tobacco: Not on file   Comment: quit in 1980s  . Alcohol Use: No  . Drug Use: Not on file  . Sexually Active: Not on file   Other Topics Concern  . Not on file   Social History Narrative  . No narrative on file    Family History  Problem Relation Age of Onset  . Stroke     Physical Exam: Filed Vitals:   08/12/11 1006  BP: 124/71  Pulse: 41  Weight: 127 lb (57.607 kg)    GEN- The patient is well appearing, alert and oriented x 3 today.   Head- normocephalic, atraumatic Eyes-  Sclera clear, conjunctiva pink Ears- hearing intact Oropharynx- clear Neck- supple, no JVP Lymph- no cervical lymphadenopathy Lungs- Clear to ausculation bilaterally, normal work of breathing Heart- bradycardic regular rhythm, no murmurs, rubs or gallops, PMI not laterally displaced GI- soft, NT, ND, + BS Extremities- no clubbing, cyanosis, or edema  ekg today reveals sinus bradycardia 41 bpm, otherwise normal ekg  Assessment and Plan:

## 2011-08-20 ENCOUNTER — Encounter: Payer: Self-pay | Admitting: Internal Medicine

## 2011-09-23 ENCOUNTER — Other Ambulatory Visit: Payer: Self-pay | Admitting: *Deleted

## 2011-09-23 MED ORDER — FLECAINIDE ACETATE 100 MG PO TABS: 100.0000 mg | ORAL_TABLET | Freq: Two times a day (BID) | ORAL | Status: DC

## 2011-10-30 ENCOUNTER — Encounter (HOSPITAL_COMMUNITY): Payer: Self-pay | Admitting: Pharmacy Technician

## 2011-11-04 ENCOUNTER — Telehealth: Payer: Self-pay | Admitting: Internal Medicine

## 2011-11-04 NOTE — Telephone Encounter (Signed)
Discussed with Dr Graciela Husbands  Would stay on Pradaxa due to CVA in 2007  I have left her a message with the above information

## 2011-11-04 NOTE — Telephone Encounter (Signed)
Pt takes pradaxa 150mg , pt having catarac surgery 11-11-11, should she dc med? pls call [phone]

## 2011-11-05 NOTE — Patient Instructions (Signed)
20 [name] Barrett  11/05/2011   Your procedure is scheduled on:  11/11/2011  Report to [facility] at  615  AM.  Call this number if you have problems the morning of surgery: [phone]   Remember:   Do not eat food:After Midnight.  May have clear liquids:until Midnight .  Clear liquids include soda, tea, black coffee, apple or grape juice, broth.  Take these medicines the morning of surgery with A SIP OF WATER: pradaxa. Take atrovent inhaler before you come   Do not wear jewelry, make-up or nail polish.  Do not wear lotions, powders, or perfumes. You may wear deodorant.  Do not shave 48 hours prior to surgery.  Do not bring valuables to the hospital.  Contacts, dentures or bridgework may not be worn into surgery.  Leave suitcase in the car. After surgery it may be brought to your room.  For patients admitted to the hospital, checkout time is 11:00 AM the day of discharge.   Patients discharged the day of surgery will not be allowed to drive home.  Name and phone number of your driver: family  Special Instructions: CHG Shower Use Special Wash: 1/2 bottle night before surgery and 1/2 bottle morning of surgery.   Please read over the following fact sheets that you were given: Pain Booklet, MRSA Information, Surgical Site Infection Prevention, Anesthesia Post-op Instructions and Care and Recovery After Surgery Cataract A cataract is a clouding of the lens of the eye. When a lens becomes cloudy, vision is reduced based on the degree and nature of the clouding. Many cataracts reduce vision to some degree. Some cataracts make people more near-sighted as they develop. Other cataracts increase glare. Cataracts that are ignored and become worse can sometimes look white. The white color can be seen through the pupil. CAUSES   Aging. However, cataracts may occur at any age, even in newborns.   Certain drugs.   Trauma to the eye.   Certain diseases such as diabetes.   Specific eye  diseases such as chronic inflammation inside the eye or a sudden attack of a rare form of glaucoma.   Inherited or acquired medical problems.  SYMPTOMS   Gradual, progressive drop in vision in the affected eye.   Severe, rapid visual loss. This most often happens when trauma is the cause.  DIAGNOSIS  To detect a cataract, an eye doctor examines the lens. Cataracts are best diagnosed with an exam of the eyes with the pupils enlarged (dilated) by drops.  TREATMENT  For an early cataract, vision may improve by using different eyeglasses or stronger lighting. If that does not help your vision, surgery is the only effective treatment. A cataract needs to be surgically removed when vision loss interferes with your everyday activities, such as driving, reading, or watching TV. A cataract may also have to be removed if it prevents examination or treatment of another eye problem. Surgery removes the cloudy lens and usually replaces it with a substitute lens (intraocular lens, IOL).  At a time when both you and your doctor agree, the cataract will be surgically removed. If you have cataracts in both eyes, only one is usually removed at a time. This allows the operated eye to heal and be out of danger from any possible problems after surgery (such as infection or poor wound healing). In rare cases, a cataract may be doing damage to your eye. In these cases, your caregiver may advise surgical removal right away. The vast majority  of people who have cataract surgery have better vision afterward. HOME CARE INSTRUCTIONS  If you are not planning surgery, you may be asked to do the following:  Use different eyeglasses.   Use stronger or brighter lighting.   Ask your eye doctor about reducing your medicine dose or changing medicines if it is thought that a medicine caused your cataract. Changing medicines does not make the cataract go away on its own.   Become familiar with your surroundings. Poor vision can  lead to injury. Avoid bumping into things on the affected side. You are at a higher risk for tripping or falling.   Exercise extreme care when driving or operating machinery.   Wear sunglasses if you are sensitive to bright light or experiencing problems with glare.  SEEK IMMEDIATE MEDICAL CARE IF:   You have a worsening or sudden vision loss.   You notice redness, swelling, or increasing pain in the eye.   You have a fever.  Document Released: 07/22/2005 Document Revised: 07/11/2011 Document Reviewed: 03/15/2011 [facility] Patient Information 2012 [place], [place].PATIENT INSTRUCTIONS POST-ANESTHESIA  IMMEDIATELY FOLLOWING SURGERY:  Do not drive or operate machinery for the first twenty four hours after surgery.  Do not make any important decisions for twenty four hours after surgery or while taking narcotic pain medications or sedatives.  If you develop intractable nausea and vomiting or a severe headache please notify your doctor immediately.  FOLLOW-UP:  Please make an appointment with your surgeon as instructed. You do not need to follow up with anesthesia unless specifically instructed to do so.  WOUND CARE INSTRUCTIONS (if applicable):  Keep a dry clean dressing on the anesthesia/puncture wound site if there is drainage.  Once the wound has quit draining you may leave it open to air.  Generally you should leave the bandage intact for twenty four hours unless there is drainage.  If the epidural site drains for more than 36-48 hours please call the anesthesia department.  QUESTIONS?:  Please feel free to call your physician or the hospital operator if you have any questions, and they will be happy to assist you.     [facility] Anesthesia Department [address] [place] [phone]

## 2011-11-06 ENCOUNTER — Other Ambulatory Visit: Payer: Self-pay

## 2011-11-06 ENCOUNTER — Encounter (HOSPITAL_COMMUNITY): Payer: Self-pay

## 2011-11-06 ENCOUNTER — Encounter (HOSPITAL_COMMUNITY)
Admission: RE | Admit: 2011-11-06 | Discharge: 2011-11-06 | Disposition: A | Discharge status: Home / Self Care | Payer: Medicare Other | Source: Ambulatory Visit | Attending: Ophthalmology | Admitting: Ophthalmology

## 2011-11-06 HISTORY — DX: Unspecified osteoarthritis, unspecified site: M19.90

## 2011-11-08 MED ORDER — LIDOCAINE HCL (PF) 1 % IJ SOLN
INTRAMUSCULAR | Status: AC
  Filled 2011-11-08: qty 2

## 2011-11-08 MED ORDER — NEOMYCIN-POLYMYXIN-DEXAMETH 3.5-10000-0.1 OP OINT
TOPICAL_OINTMENT | OPHTHALMIC | Status: AC
  Filled 2011-11-08: qty 3.5

## 2011-11-08 MED ORDER — PHENYLEPHRINE HCL 2.5 % OP SOLN
OPHTHALMIC | Status: AC
  Filled 2011-11-08: qty 2

## 2011-11-08 MED ORDER — LIDOCAINE HCL 3.5 % OP GEL
OPHTHALMIC | Status: AC
  Filled 2011-11-08: qty 5

## 2011-11-08 MED ORDER — CYCLOPENTOLATE-PHENYLEPHRINE 0.2-1 % OP SOLN
OPHTHALMIC | Status: AC
  Filled 2011-11-08: qty 2

## 2011-11-08 MED ORDER — TETRACAINE HCL 0.5 % OP SOLN
OPHTHALMIC | Status: AC
  Filled 2011-11-08: qty 2

## 2011-11-11 ENCOUNTER — Ambulatory Visit (HOSPITAL_COMMUNITY)
Admission: RE | Admit: 2011-11-11 | Discharge: 2011-11-11 | Disposition: A | Discharge status: Home / Self Care | Payer: Medicare Other | Source: Ambulatory Visit | Attending: Ophthalmology | Admitting: Ophthalmology

## 2011-11-11 ENCOUNTER — Encounter (HOSPITAL_COMMUNITY): Payer: Self-pay | Admitting: Anesthesiology

## 2011-11-11 ENCOUNTER — Ambulatory Visit (HOSPITAL_COMMUNITY): Payer: Medicare Other | Admitting: Anesthesiology

## 2011-11-11 ENCOUNTER — Encounter (HOSPITAL_COMMUNITY)
Admission: RE | Disposition: A | Discharge status: Home / Self Care | Payer: Self-pay | Source: Ambulatory Visit | Attending: Ophthalmology

## 2011-11-11 ENCOUNTER — Encounter (HOSPITAL_COMMUNITY): Payer: Self-pay | Admitting: *Deleted

## 2011-11-11 DIAGNOSIS — Z01810 Encounter for preprocedural cardiovascular examination: Secondary | ICD-10-CM | POA: Insufficient documentation

## 2011-11-11 DIAGNOSIS — I10 Essential (primary) hypertension: Secondary | ICD-10-CM | POA: Insufficient documentation

## 2011-11-11 DIAGNOSIS — H2510 Age-related nuclear cataract, unspecified eye: Secondary | ICD-10-CM | POA: Insufficient documentation

## 2011-11-11 DIAGNOSIS — I4891 Unspecified atrial fibrillation: Secondary | ICD-10-CM | POA: Insufficient documentation

## 2011-11-11 DIAGNOSIS — Z8673 Personal history of transient ischemic attack (TIA), and cerebral infarction without residual deficits: Secondary | ICD-10-CM | POA: Insufficient documentation

## 2011-11-11 DIAGNOSIS — Z01812 Encounter for preprocedural laboratory examination: Secondary | ICD-10-CM | POA: Insufficient documentation

## 2011-11-11 HISTORY — PX: CATARACT EXTRACTION W/PHACO: SHX586

## 2011-11-11 LAB — POCT I-STAT 4, (NA,K, GLUC, HGB,HCT)
Glucose, Bld: 83 mg/dL (ref 70–99)
Potassium: 4.2 mEq/L (ref 3.5–5.1)

## 2011-11-11 SURGERY — PHACOEMULSIFICATION, CATARACT, WITH IOL INSERTION
Anesthesia: Monitor Anesthesia Care | Site: Eye | Laterality: Left | Wound class: Clean

## 2011-11-11 MED ORDER — ONDANSETRON HCL 4 MG/2ML IJ SOLN: 4.0000 mg | Freq: Once | INTRAMUSCULAR | Status: DC | PRN

## 2011-11-11 MED ORDER — BSS IO SOLN
INTRAOCULAR | Status: DC | PRN
  Administered 2011-11-11: 15 mL via INTRAOCULAR

## 2011-11-11 MED ORDER — POVIDONE-IODINE 5 % OP SOLN
OPHTHALMIC | Status: DC | PRN
  Administered 2011-11-11: 1 via OPHTHALMIC

## 2011-11-11 MED ORDER — TETRACAINE HCL 0.5 % OP SOLN
1.0000 [drp] | OPHTHALMIC | Status: AC
  Administered 2011-11-11 (×3): 1 [drp] via OPHTHALMIC

## 2011-11-11 MED ORDER — LIDOCAINE 3.5 % OP GEL OPTIME - NO CHARGE
OPHTHALMIC | Status: DC | PRN
  Administered 2011-11-11: 1 [drp] via OPHTHALMIC

## 2011-11-11 MED ORDER — PROVISC 10 MG/ML IO SOLN
INTRAOCULAR | Status: DC | PRN
  Administered 2011-11-11: 8.5 mg via INTRAOCULAR

## 2011-11-11 MED ORDER — NEOMYCIN-POLYMYXIN-DEXAMETH 0.1 % OP OINT
TOPICAL_OINTMENT | OPHTHALMIC | Status: DC | PRN
  Administered 2011-11-11: 1 via OPHTHALMIC

## 2011-11-11 MED ORDER — MIDAZOLAM HCL 2 MG/2ML IJ SOLN
1.0000 mg | INTRAMUSCULAR | Status: DC | PRN
  Administered 2011-11-11: 2 mg via INTRAVENOUS

## 2011-11-11 MED ORDER — EPINEPHRINE HCL 1 MG/ML IJ SOLN
INTRAOCULAR | Status: DC | PRN
  Administered 2011-11-11: 08:00:00

## 2011-11-11 MED ORDER — CYCLOPENTOLATE-PHENYLEPHRINE 0.2-1 % OP SOLN
1.0000 [drp] | OPHTHALMIC | Status: AC
  Administered 2011-11-11 (×3): 1 [drp] via OPHTHALMIC

## 2011-11-11 MED ORDER — LIDOCAINE HCL (PF) 1 % IJ SOLN
INTRAMUSCULAR | Status: DC | PRN
  Administered 2011-11-11: .6 mL

## 2011-11-11 MED ORDER — LACTATED RINGERS IV SOLN
INTRAVENOUS | Status: DC
  Administered 2011-11-11: 1000 mL via INTRAVENOUS

## 2011-11-11 MED ORDER — EPINEPHRINE HCL 1 MG/ML IJ SOLN
INTRAMUSCULAR | Status: AC
  Filled 2011-11-11: qty 1

## 2011-11-11 MED ORDER — LIDOCAINE HCL 3.5 % OP GEL
1.0000 "application " | Freq: Once | OPHTHALMIC | Status: AC
  Administered 2011-11-11: 1 via OPHTHALMIC

## 2011-11-11 MED ORDER — PHENYLEPHRINE HCL 2.5 % OP SOLN
1.0000 [drp] | OPHTHALMIC | Status: AC
  Administered 2011-11-11 (×3): 1 [drp] via OPHTHALMIC

## 2011-11-11 MED ORDER — FENTANYL CITRATE 0.05 MG/ML IJ SOLN: 25.0000 ug | INTRAMUSCULAR | Status: DC | PRN

## 2011-11-11 MED ORDER — MIDAZOLAM HCL 2 MG/2ML IJ SOLN
INTRAMUSCULAR | Status: AC
  Administered 2011-11-11: 2 mg via INTRAVENOUS
  Filled 2011-11-11: qty 2

## 2011-11-11 SURGICAL SUPPLY — 30 items
CAPSULAR TENSION RING-AMO (OPHTHALMIC RELATED) IMPLANT
CLOTH BEACON ORANGE TIMEOUT ST (SAFETY) ×2 IMPLANT
EYE SHIELD UNIVERSAL CLEAR (GAUZE/BANDAGES/DRESSINGS) ×4 IMPLANT
GLOVE BIO SURGEON STRL SZ 6.5 (GLOVE) IMPLANT
GLOVE BIOGEL PI IND STRL 6.5 (GLOVE) IMPLANT
GLOVE BIOGEL PI IND STRL 7.0 (GLOVE) ×1 IMPLANT
GLOVE BIOGEL PI IND STRL 7.5 (GLOVE) IMPLANT
GLOVE BIOGEL PI INDICATOR 6.5 (GLOVE)
GLOVE BIOGEL PI INDICATOR 7.0 (GLOVE) ×1
GLOVE BIOGEL PI INDICATOR 7.5 (GLOVE)
GLOVE ECLIPSE 6.5 STRL STRAW (GLOVE) IMPLANT
GLOVE ECLIPSE 7.0 STRL STRAW (GLOVE) IMPLANT
GLOVE ECLIPSE 7.5 STRL STRAW (GLOVE) IMPLANT
GLOVE EXAM NITRILE LRG STRL (GLOVE) IMPLANT
GLOVE EXAM NITRILE MD LF STRL (GLOVE) ×2 IMPLANT
GLOVE SKINSENSE NS SZ6.5 (GLOVE)
GLOVE SKINSENSE NS SZ7.0 (GLOVE)
GLOVE SKINSENSE STRL SZ6.5 (GLOVE) IMPLANT
GLOVE SKINSENSE STRL SZ7.0 (GLOVE) IMPLANT
KIT VITRECTOMY (OPHTHALMIC RELATED) IMPLANT
PAD ARMBOARD 7.5X6 YLW CONV (MISCELLANEOUS) ×2 IMPLANT
PROC W NO LENS (INTRAOCULAR LENS)
PROC W SPEC LENS (INTRAOCULAR LENS)
PROCESS W NO LENS (INTRAOCULAR LENS) IMPLANT
PROCESS W SPEC LENS (INTRAOCULAR LENS) IMPLANT
RING MALYGIN (MISCELLANEOUS) IMPLANT
SIGHTPATH CAT PROC W REG LENS (Ophthalmic Related) ×2 IMPLANT
SYR TB 1ML LL NO SAFETY (SYRINGE) ×2 IMPLANT
VISCOELASTIC ADDITIONAL (OPHTHALMIC RELATED) IMPLANT
WATER STERILE IRR 250ML POUR (IV SOLUTION) ×2 IMPLANT

## 2011-11-11 NOTE — Discharge Instructions (Signed)
[name] Barrett  11/11/2011     Instructions  1. Use medications as Instructed.  Shake well before use. Wait 5 minutes between drops.  {OPHTHALMIC ANTIBIOTICS:22167} 4 times a day x 1 week.  {OPHTHALMIC ANTI-INFLAMMATORY:22168} 2 times a day x 4 weeks.  {OPHTHALMIC STEROID:22169} 4 times a day - week 1   3 times a day - Week 2, 2 times a day- Week 3, 1 time a day - Week 4.  2. Do not rub the operative eye. Do not swim underwater for 2 weeks.  3. You may remove the clear shield and resume your normal activities the day after  Surgery. Your eyes may feel more comfortable if you wear dark glasses outside.  4. Call our office at [phone] if you have sudden change in vision, extreme redness or pain. Some fluctuation in vision is normal after surgery. If you have an emergency after hours, call Dr. Alto Denver at [phone].  5. It is important that you attend all of your follow-up appointments.        Follow-up:{follow up:32580} with Gemma Payor, MD.   Dr. Lahoma Crocker: [phone]  Dr. Lita Mains: [phone]  Dr. Alto Denver: [phone]   If you find that you cannot contact your physician, but feel that your signs and   Symptoms warrant a physician's attention, call the Emergency Room at   [phone] [phone].   Other{NA AND PPIRJJOA:41660}.

## 2011-11-11 NOTE — Op Note (Signed)
NAME:  [name], [name]       ACCOUNT NO.:  [id number]  MEDICAL RECORD NO.:  [id number]  LOCATION:  [facility]                          FACILITY:  [facility]  PHYSICIAN:  Susanne Greenhouse, MD       DATE OF BIRTH:  Dec 21, 1930  DATE OF PROCEDURE:  11/11/2011 DATE OF DISCHARGE:  11/11/2011                              OPERATIVE REPORT   PREOPERATIVE DIAGNOSIS:  Nuclear cataract, left eye, diagnosis code 366.16.  POSTOPERATIVE DIAGNOSIS:  Nuclear cataract, left eye, diagnosis code 366.16.  OPERATION PERFORMED:  Phacoemulsification with posterior chamber intraocular lens implantation, left eye.  SURGEON:  Susanne Greenhouse, MD  ANESTHESIA:  Local with monitored anesthesia care..  OPERATIVE SUMMARY:  In the preoperative area, dilating drops were placed into the left eye.  The patient was then brought into the operating room where she was placed under general anesthesia.  The eye was then prepped and draped.  Beginning with a #75 blade, a paracentesis port was made at the surgeon's 2 o'clock position.  The anterior chamber was then filled with a 1% nonpreserved lidocaine solution with epinephrine.  This was followed by Viscoat to deepen the chamber.  A small fornix-based peritomy was performed superiorly.  Next, a single iris hook was placed through the limbus superiorly.  A 2.4-mm keratome blade was then used to make a clear corneal incision over the iris hook.  A bent cystotome needle and Utrata forceps were used to create a continuous tear capsulotomy.  Hydrodissection was performed using balanced salt solution on a fine cannula.  The lens nucleus was then removed using phacoemulsification in a quadrant cracking technique.  The cortical material was then removed with irrigation and aspiration.  The capsular bag and anterior chamber were refilled with Provisc.  The wound was widened to approximately 3 mm and a posterior chamber intraocular lens was placed into the capsular bag without difficulty  using an [employer] lens injecting system.  A single 10-0 nylon suture was then used to close the incision as well as stromal hydration.  The Provisc was removed from the anterior chamber and capsular bag with irrigation and aspiration.  At this point, the wounds were tested for leak, which were negative.  The anterior chamber remained deep and stable.  The patient tolerated the procedure well.  There were no operative complications, and she awoke from general anesthesia without problem.  No surgical specimens.  Prosthetic device used is a Cabin crew posterior chamber lens, model MX60, power of 26.0, serial number is [id number].          ______________________________ Susanne Greenhouse, MD     KEH/MEDQ  D:  11/11/2011  T:  11/11/2011  Job:  [id number]

## 2011-11-11 NOTE — H&P (Signed)
I have reviewed the H&P, the patient was re-examined, and I have identified no interval changes in medical condition and plan of care since the history and physical of record  

## 2011-11-11 NOTE — Anesthesia Postprocedure Evaluation (Signed)
  Anesthesia Post-op Note  Patient: [name]  Procedure(s) Performed: Procedure(s) (LRB): CATARACT EXTRACTION PHACO AND INTRAOCULAR LENS PLACEMENT (IOC) (Left)  Patient Location: PACU and Short Stay  Anesthesia Type: MAC  Level of Consciousness: awake, alert , oriented and patient cooperative  Airway and Oxygen Therapy: Patient Spontanous Breathing  Post-op Pain: none  Post-op Assessment: Post-op Vital signs reviewed, Patient's Cardiovascular Status Stable, Respiratory Function Stable, Patent Airway and No signs of Nausea or vomiting  Post-op Vital Signs: Reviewed and stable  Complications: No apparent anesthesia complications

## 2011-11-11 NOTE — Anesthesia Preprocedure Evaluation (Signed)
Anesthesia Evaluation  Patient identified by MRN, date of birth, ID band Patient awake    Reviewed: Allergy & Precautions, H&P , NPO status , Patient's Chart, lab work & pertinent test results  History of Anesthesia Complications Negative for: history of anesthetic complications  Airway Mallampati: II      Dental  (+) Edentulous Upper and Implants   Pulmonary  breath sounds clear to auscultation        Cardiovascular hypertension, Pt. on medications + dysrhythmias (s/p ablation 2011) Atrial Fibrillation Rhythm:Regular Rate:Bradycardia     Neuro/Psych CVA    GI/Hepatic   Endo/Other    Renal/GU      Musculoskeletal   Abdominal   Peds  Hematology   Anesthesia Other Findings   Reproductive/Obstetrics                           Anesthesia Physical Anesthesia Plan  ASA: III  Anesthesia Plan: MAC   Post-op Pain Management:    Induction: Intravenous  Airway Management Planned: Nasal Cannula  Additional Equipment:   Intra-op Plan:   Post-operative Plan:   Informed Consent: I have reviewed the patients History and Physical, chart, labs and discussed the procedure including the risks, benefits and alternatives for the proposed anesthesia with the patient or authorized representative who has indicated his/her understanding and acceptance.     Plan Discussed with:   Anesthesia Plan Comments:         Anesthesia Quick Evaluation  

## 2011-11-11 NOTE — Transfer of Care (Signed)
Immediate Anesthesia Transfer of Care Note  Patient: [name]  Procedure(s) Performed: Procedure(s) (LRB): CATARACT EXTRACTION PHACO AND INTRAOCULAR LENS PLACEMENT (IOC) (Left)  Patient Location: PACU and Short Stay  Anesthesia Type: MAC  Level of Consciousness: awake, alert , oriented and patient cooperative  Airway & Oxygen Therapy: Patient Spontanous Breathing  Post-op Assessment: Report given to PACU RN, Post -op Vital signs reviewed and stable and Patient moving all extremities  Post vital signs: Reviewed and stable  Complications: No apparent anesthesia complications

## 2011-11-11 NOTE — Brief Op Note (Signed)
Pre-Op Dx: Cataract OS Post-Op Dx: Cataract OS Surgeon: Jessice Madill Anesthesia: Topical with MAC Implant: B&L enVista Specimen: None Complications: None 

## 2011-11-13 ENCOUNTER — Encounter (HOSPITAL_COMMUNITY): Payer: Self-pay | Admitting: Ophthalmology

## 2011-11-14 ENCOUNTER — Encounter: Payer: Self-pay | Admitting: Internal Medicine

## 2011-11-15 ENCOUNTER — Encounter (HOSPITAL_COMMUNITY): Payer: Self-pay | Admitting: Pharmacy Technician

## 2011-11-20 ENCOUNTER — Encounter (HOSPITAL_COMMUNITY): Payer: Self-pay

## 2011-11-20 ENCOUNTER — Encounter (HOSPITAL_COMMUNITY)
Admission: RE | Admit: 2011-11-20 | Discharge: 2011-11-20 | Payer: Medicare Other | Source: Ambulatory Visit | Admitting: Ophthalmology

## 2011-11-22 MED ORDER — TETRACAINE HCL 0.5 % OP SOLN
OPHTHALMIC | Status: AC
  Filled 2011-11-22: qty 2

## 2011-11-22 MED ORDER — NEOMYCIN-POLYMYXIN-DEXAMETH 3.5-10000-0.1 OP OINT
TOPICAL_OINTMENT | OPHTHALMIC | Status: AC
  Filled 2011-11-22: qty 3.5

## 2011-11-22 MED ORDER — CYCLOPENTOLATE-PHENYLEPHRINE 0.2-1 % OP SOLN
OPHTHALMIC | Status: AC
  Filled 2011-11-22: qty 2

## 2011-11-22 MED ORDER — LIDOCAINE HCL 3.5 % OP GEL
OPHTHALMIC | Status: AC
  Filled 2011-11-22: qty 5

## 2011-11-22 MED ORDER — PHENYLEPHRINE HCL 2.5 % OP SOLN
OPHTHALMIC | Status: AC
  Filled 2011-11-22: qty 2

## 2011-11-22 MED ORDER — LIDOCAINE HCL (PF) 1 % IJ SOLN
INTRAMUSCULAR | Status: AC
  Filled 2011-11-22: qty 2

## 2011-11-25 ENCOUNTER — Encounter (HOSPITAL_COMMUNITY): Payer: Self-pay

## 2011-11-25 ENCOUNTER — Encounter (HOSPITAL_COMMUNITY): Payer: Self-pay | Admitting: Anesthesiology

## 2011-11-25 ENCOUNTER — Encounter (HOSPITAL_COMMUNITY)
Admission: RE | Disposition: A | Discharge status: Home / Self Care | Payer: Self-pay | Source: Ambulatory Visit | Attending: Ophthalmology

## 2011-11-25 ENCOUNTER — Ambulatory Visit (HOSPITAL_COMMUNITY)
Admission: RE | Admit: 2011-11-25 | Discharge: 2011-11-25 | Disposition: A | Discharge status: Home / Self Care | Payer: Medicare Other | Source: Ambulatory Visit | Attending: Ophthalmology | Admitting: Ophthalmology

## 2011-11-25 ENCOUNTER — Ambulatory Visit (HOSPITAL_COMMUNITY): Payer: Medicare Other | Admitting: Anesthesiology

## 2011-11-25 DIAGNOSIS — I10 Essential (primary) hypertension: Secondary | ICD-10-CM | POA: Insufficient documentation

## 2011-11-25 DIAGNOSIS — H2510 Age-related nuclear cataract, unspecified eye: Secondary | ICD-10-CM | POA: Insufficient documentation

## 2011-11-25 HISTORY — PX: CATARACT EXTRACTION W/PHACO: SHX586

## 2011-11-25 SURGERY — PHACOEMULSIFICATION, CATARACT, WITH IOL INSERTION
Anesthesia: Monitor Anesthesia Care | Site: Eye | Laterality: Right | Wound class: Clean

## 2011-11-25 MED ORDER — EPINEPHRINE HCL 1 MG/ML IJ SOLN
INTRAOCULAR | Status: DC | PRN
  Administered 2011-11-25: 11:00:00

## 2011-11-25 MED ORDER — CYCLOPENTOLATE-PHENYLEPHRINE 0.2-1 % OP SOLN
1.0000 [drp] | OPHTHALMIC | Status: AC
  Administered 2011-11-25 (×3): 1 [drp] via OPHTHALMIC

## 2011-11-25 MED ORDER — POVIDONE-IODINE 5 % OP SOLN
OPHTHALMIC | Status: DC | PRN
  Administered 2011-11-25: 1 via OPHTHALMIC

## 2011-11-25 MED ORDER — LIDOCAINE HCL (PF) 1 % IJ SOLN
INTRAMUSCULAR | Status: DC | PRN
  Administered 2011-11-25: .4 mL

## 2011-11-25 MED ORDER — MIDAZOLAM HCL 2 MG/2ML IJ SOLN
1.0000 mg | INTRAMUSCULAR | Status: DC | PRN
  Administered 2011-11-25: 2 mg via INTRAVENOUS

## 2011-11-25 MED ORDER — LIDOCAINE 3.5 % OP GEL OPTIME - NO CHARGE
OPHTHALMIC | Status: DC | PRN
  Administered 2011-11-25: 2 [drp] via OPHTHALMIC

## 2011-11-25 MED ORDER — PROVISC 10 MG/ML IO SOLN
INTRAOCULAR | Status: DC | PRN
  Administered 2011-11-25: 8.5 mg via INTRAOCULAR

## 2011-11-25 MED ORDER — FENTANYL CITRATE 0.05 MG/ML IJ SOLN: 25.0000 ug | INTRAMUSCULAR | Status: DC | PRN

## 2011-11-25 MED ORDER — PHENYLEPHRINE HCL 2.5 % OP SOLN
1.0000 [drp] | OPHTHALMIC | Status: AC
  Administered 2011-11-25 (×3): 1 [drp] via OPHTHALMIC

## 2011-11-25 MED ORDER — EPINEPHRINE HCL 1 MG/ML IJ SOLN
INTRAMUSCULAR | Status: AC
  Filled 2011-11-25: qty 1

## 2011-11-25 MED ORDER — LACTATED RINGERS IV SOLN
INTRAVENOUS | Status: DC
  Administered 2011-11-25: 11:00:00 via INTRAVENOUS

## 2011-11-25 MED ORDER — ONDANSETRON HCL 4 MG/2ML IJ SOLN: 4.0000 mg | Freq: Once | INTRAMUSCULAR | Status: DC | PRN

## 2011-11-25 MED ORDER — MIDAZOLAM HCL 2 MG/2ML IJ SOLN
INTRAMUSCULAR | Status: AC
  Administered 2011-11-25: 2 mg via INTRAVENOUS
  Filled 2011-11-25: qty 2

## 2011-11-25 MED ORDER — NEOMYCIN-POLYMYXIN-DEXAMETH 0.1 % OP OINT
TOPICAL_OINTMENT | OPHTHALMIC | Status: DC | PRN
  Administered 2011-11-25: 1 via OPHTHALMIC

## 2011-11-25 MED ORDER — LIDOCAINE HCL 3.5 % OP GEL
1.0000 "application " | Freq: Once | OPHTHALMIC | Status: AC
  Administered 2011-11-25: 1 via OPHTHALMIC

## 2011-11-25 MED ORDER — TETRACAINE HCL 0.5 % OP SOLN
1.0000 [drp] | OPHTHALMIC | Status: AC
  Administered 2011-11-25 (×3): 1 [drp] via OPHTHALMIC

## 2011-11-25 MED ORDER — BSS IO SOLN
INTRAOCULAR | Status: DC | PRN
  Administered 2011-11-25: 15 mL via INTRAOCULAR

## 2011-11-25 SURGICAL SUPPLY — 32 items
CAPSULAR TENSION RING-AMO (OPHTHALMIC RELATED) IMPLANT
CLOTH BEACON ORANGE TIMEOUT ST (SAFETY) ×2 IMPLANT
EYE SHIELD UNIVERSAL CLEAR (GAUZE/BANDAGES/DRESSINGS) ×4 IMPLANT
GLOVE BIO SURGEON STRL SZ 6.5 (GLOVE) IMPLANT
GLOVE BIOGEL PI IND STRL 6.5 (GLOVE) ×1 IMPLANT
GLOVE BIOGEL PI IND STRL 7.0 (GLOVE) IMPLANT
GLOVE BIOGEL PI IND STRL 7.5 (GLOVE) IMPLANT
GLOVE BIOGEL PI INDICATOR 6.5 (GLOVE) ×1
GLOVE BIOGEL PI INDICATOR 7.0 (GLOVE)
GLOVE BIOGEL PI INDICATOR 7.5 (GLOVE)
GLOVE ECLIPSE 6.5 STRL STRAW (GLOVE) IMPLANT
GLOVE ECLIPSE 7.0 STRL STRAW (GLOVE) IMPLANT
GLOVE ECLIPSE 7.5 STRL STRAW (GLOVE) IMPLANT
GLOVE EXAM NITRILE LRG STRL (GLOVE) IMPLANT
GLOVE EXAM NITRILE MD LF STRL (GLOVE) ×2 IMPLANT
GLOVE SKINSENSE NS SZ6.5 (GLOVE)
GLOVE SKINSENSE NS SZ7.0 (GLOVE)
GLOVE SKINSENSE STRL SZ6.5 (GLOVE) IMPLANT
GLOVE SKINSENSE STRL SZ7.0 (GLOVE) IMPLANT
KIT VITRECTOMY (OPHTHALMIC RELATED) IMPLANT
PAD ARMBOARD 7.5X6 YLW CONV (MISCELLANEOUS) ×2 IMPLANT
PROC W NO LENS (INTRAOCULAR LENS)
PROC W SPEC LENS (INTRAOCULAR LENS)
PROCESS W NO LENS (INTRAOCULAR LENS) IMPLANT
PROCESS W SPEC LENS (INTRAOCULAR LENS) IMPLANT
RING MALYGIN (MISCELLANEOUS) IMPLANT
SIGHTPATH CAT PROC W REG LENS (Ophthalmic Related) ×2 IMPLANT
SYR TB 1ML LL NO SAFETY (SYRINGE) ×2 IMPLANT
TAPE SURG TRANSPORE 1 IN (GAUZE/BANDAGES/DRESSINGS) ×1 IMPLANT
TAPE SURGICAL TRANSPORE 1 IN (GAUZE/BANDAGES/DRESSINGS) ×1
VISCOELASTIC ADDITIONAL (OPHTHALMIC RELATED) IMPLANT
WATER STERILE IRR 250ML POUR (IV SOLUTION) ×2 IMPLANT

## 2011-11-25 NOTE — Op Note (Signed)
NAME:  [name], [name]       ACCOUNT NO.:  [id number]  MEDICAL RECORD NO.:  [id number]  LOCATION:  [facility]                          FACILITY:  [facility]  PHYSICIAN:  Susanne Greenhouse, MD       DATE OF BIRTH:  31-Aug-1930  DATE OF PROCEDURE:  11/25/2011 DATE OF DISCHARGE:  11/25/2011                              OPERATIVE REPORT   PREOPERATIVE DIAGNOSIS:  Nuclear cataract, right eye, diagnosis code 366.16.  POSTOPERATIVE DIAGNOSIS:  Nuclear cataract, right eye, diagnosis code 366.16.  OPERATION PERFORMED:  Phacoemulsification with posterior chamber intraocular lens implantation, right eye.  SURGEON:  Bonne Dolores. Felisia Balcom, MD  ANESTHESIA:  Topical with monitored anesthesia care..  OPERATIVE SUMMARY:  In the preoperative area, dilating drops were placed into the right eye.  The patient was then brought into the operating room where she was placed under general anesthesia.  The eye was then prepped and draped.  Beginning with a 75 blade, a paracentesis port was made at the surgeon's 2 o'clock position.  The anterior chamber was then filled with a 1% nonpreserved lidocaine solution with epinephrine.  This was followed by Viscoat to deepen the chamber.  A small fornix-based peritomy was performed superiorly.  Next, a single iris hook was placed through the limbus superiorly.  A 2.4-mm keratome blade was then used to make a clear corneal incision over the iris hook.  A bent cystotome needle and Utrata forceps were used to create a continuous tear capsulotomy.  Hydrodissection was performed using balanced salt solution on a fine cannula.  The lens nucleus was then removed using phacoemulsification in a quadrant cracking technique.  The cortical material was then removed with irrigation and aspiration.  The capsular bag and anterior chamber were refilled with Provisc.  The wound was widened to approximately 3 mm and a posterior chamber intraocular lens was placed into the capsular bag without  difficulty using an [employer] lens injecting system.  A single 10-0 nylon suture was then used to close the incision as well as stromal hydration.  The Provisc was removed from the anterior chamber and capsular bag with irrigation and aspiration.  At this point, the wounds were tested for leak, which were negative.  The anterior chamber remained deep and stable.  The patient tolerated the procedure well.  There were no operative complications, and she awoke from general anesthesia without problem.  There were no surgical specimens.  Prosthetic device used is a Designer, industrial/product lens, model MX60, power of 24.5, serial number is [id number].          ______________________________ Susanne Greenhouse, MD     KEH/MEDQ  D:  11/25/2011  T:  11/25/2011  Job:  [id number]

## 2011-11-25 NOTE — Brief Op Note (Signed)
Pre-Op Dx: Cataract OD Post-Op Dx: Cataract OD Surgeon: Court Gracia Anesthesia: Topical with MAC Implant: B&L enVista Specimen: None Complications: None 

## 2011-11-25 NOTE — Discharge Instructions (Signed)
[name] Barrett  11/25/2011     Instructions  1. Use medications as Instructed.  Shake well before use. Wait 5 minutes between drops.  {OPHTHALMIC ANTIBIOTICS:22167} 4 times a day x 1 week.  {OPHTHALMIC ANTI-INFLAMMATORY:22168} 2 times a day x 4 weeks.  {OPHTHALMIC STEROID:22169} 4 times a day - week 1   3 times a day - Week 2, 2 times a day- Week 3, 1 time a day - Week 4.  2. Do not rub the operative eye. Do not swim underwater for 2 weeks.  3. You may remove the clear shield and resume your normal activities the day after  Surgery. Your eyes may feel more comfortable if you wear dark glasses outside.  4. Call our office at [phone] if you have sudden change in vision, extreme redness or pain. Some fluctuation in vision is normal after surgery. If you have an emergency after hours, call Dr. Alto Denver at [phone].  5. It is important that you attend all of your follow-up appointments.        Follow-up:{follow up:32580} with Gemma Payor, MD.   Dr. Lahoma Crocker: [phone]  Dr. Lita Mains: [phone]  Dr. Alto Denver: [phone]   If you find that you cannot contact your physician, but feel that your signs and   Symptoms warrant a physician's attention, call the Emergency Room at   [phone].   Other{NA AND WUXLKGMW:10272}.

## 2011-11-25 NOTE — H&P (Signed)
I have reviewed the H&P, the patient was re-examined, and I have identified no interval changes in medical condition and plan of care since the history and physical of record  

## 2011-11-25 NOTE — Transfer of Care (Signed)
Immediate Anesthesia Transfer of Care Note  Patient: [name]  Procedure(s) Performed: Procedure(s) (LRB): CATARACT EXTRACTION PHACO AND INTRAOCULAR LENS PLACEMENT (IOC) (Right)  Patient Location: PACU  Anesthesia Type: MAC  Level of Consciousness: awake  Airway & Oxygen Therapy: Patient Spontanous Breathing  Post-op Assessment: Report given to PACU RN  Post vital signs: Reviewed and stable  Complications: No apparent anesthesia complications

## 2011-11-25 NOTE — Anesthesia Preprocedure Evaluation (Signed)
Anesthesia Evaluation  Patient identified by MRN, date of birth, ID band Patient awake    Reviewed: Allergy & Precautions, H&P , NPO status , Patient's Chart, lab work & pertinent test results  History of Anesthesia Complications Negative for: history of anesthetic complications  Airway Mallampati: II      Dental  (+) Edentulous Upper and Implants   Pulmonary  breath sounds clear to auscultation        Cardiovascular hypertension, Pt. on medications + dysrhythmias (s/p ablation 2011) Atrial Fibrillation Rhythm:Regular Rate:Bradycardia     Neuro/Psych CVA    GI/Hepatic   Endo/Other    Renal/GU      Musculoskeletal   Abdominal   Peds  Hematology   Anesthesia Other Findings   Reproductive/Obstetrics                           Anesthesia Physical Anesthesia Plan  ASA: III  Anesthesia Plan: MAC   Post-op Pain Management:    Induction: Intravenous  Airway Management Planned: Nasal Cannula  Additional Equipment:   Intra-op Plan:   Post-operative Plan:   Informed Consent: I have reviewed the patients History and Physical, chart, labs and discussed the procedure including the risks, benefits and alternatives for the proposed anesthesia with the patient or authorized representative who has indicated his/her understanding and acceptance.     Plan Discussed with:   Anesthesia Plan Comments:         Anesthesia Quick Evaluation

## 2011-11-25 NOTE — Anesthesia Postprocedure Evaluation (Signed)
  Anesthesia Post-op Note  Patient: [name]  Procedure(s) Performed: Procedure(s) (LRB): CATARACT EXTRACTION PHACO AND INTRAOCULAR LENS PLACEMENT (IOC) (Right)  Patient Location: PACU and Short Stay  Anesthesia Type: MAC  Level of Consciousness: awake, alert  and oriented  Airway and Oxygen Therapy: Patient Spontanous Breathing  Post-op Pain: none  Post-op Assessment: Post-op Vital signs reviewed, Patient's Cardiovascular Status Stable, Respiratory Function Stable, Patent Airway and No signs of Nausea or vomiting  Post-op Vital Signs: Reviewed and stable  Complications: No apparent anesthesia complications

## 2011-11-27 ENCOUNTER — Encounter (HOSPITAL_COMMUNITY): Payer: Self-pay | Admitting: Ophthalmology

## 2012-02-20 ENCOUNTER — Encounter: Payer: Self-pay | Admitting: Internal Medicine

## 2012-03-02 ENCOUNTER — Encounter: Payer: Self-pay | Admitting: Internal Medicine

## 2012-03-02 ENCOUNTER — Ambulatory Visit (INDEPENDENT_AMBULATORY_CARE_PROVIDER_SITE_OTHER): Payer: Medicare Other | Admitting: Internal Medicine

## 2012-03-02 VITALS — BP 110/50 | HR 58 | Resp 18 | Ht 62.0 in | Wt 121.1 lb

## 2012-03-02 DIAGNOSIS — I495 Sick sinus syndrome: Secondary | ICD-10-CM

## 2012-03-02 DIAGNOSIS — I4891 Unspecified atrial fibrillation: Secondary | ICD-10-CM

## 2012-03-02 DIAGNOSIS — I10 Essential (primary) hypertension: Secondary | ICD-10-CM

## 2012-03-02 MED ORDER — FLECAINIDE ACETATE 100 MG PO TABS: 50.0000 mg | ORAL_TABLET | Freq: Two times a day (BID) | ORAL | Status: DC

## 2012-03-02 NOTE — Assessment & Plan Note (Signed)
Stable No change required today  

## 2012-03-02 NOTE — Assessment & Plan Note (Signed)
Decrease flecainide to 50mg  BID due to dizziness.  If afib worsens, then we will consider repeat ablation vs pacemaker implant

## 2012-03-02 NOTE — Progress Notes (Signed)
PCP:  Rudi Heap, MD  The patient presents today for routine electrophysiology followup.  Since last being seen in our clinic, the patient reports doing very well.  Though her afib is much improved with flecainide, she reports more unsteadiness since increasing her dose from 50mg  to 100mg  BID.   Today, she denies symptoms of chest pain, shortness of breath, orthopnea, PND, lower extremity edema, presyncope,  Or syncope.    Past Medical History  Diagnosis Date  . Hyperlipidemia   . Microcytic anemia   . Osteopenia   . 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 Persistent atrial fibrillation     s/p PVI 04/2010  . Sinus bradycardia   . CVA (cerebral vascular accident) 2007  . Arthritis    Past Surgical History  Procedure Date  . Breast mass resected   . Afib ablation 04/2010    PVI with CTI ablation performed by [facility]  . Cardioversion     for afib  . Cataract extraction w/phaco 11/11/2011    Procedure: CATARACT EXTRACTION PHACO AND INTRAOCULAR LENS PLACEMENT (IOC);  Surgeon: Gemma Payor, MD;  Location: [facility];  Service: Ophthalmology;  Laterality: Left;  CDE:12.62  . Cataract extraction w/phaco 11/25/2011    Procedure: CATARACT EXTRACTION PHACO AND INTRAOCULAR LENS PLACEMENT (IOC);  Surgeon: Gemma Payor, MD;  Location: [facility];  Service: Ophthalmology;  Laterality: Right;  CDE 16.14    Current Outpatient Prescriptions  Medication Sig Dispense Refill  . atorvastatin (LIPITOR) 10 MG tablet Take 10 mg by mouth daily.        . Calcium Carbonate-Vit D-Min 1200-1000 MG-UNIT CHEW Chew 1 tablet by mouth daily.      . chlorhexidine (PERIDEX) 0.12 % solution       . Cholecalciferol (VITAMIN D) 2000 UNITS CAPS Take 1 capsule by mouth daily.        . dabigatran (PRADAXA) 150 MG CAPS Take 150 mg by mouth every 12 (twelve) hours.      . flecainide (TAMBOCOR) 100 MG tablet Take 1 tablet (100 mg total) by mouth 2 (two) times daily.  60 tablet  6  . ipratropium (ATROVENT) 0.06 % nasal spray  Place 1 spray into the nose 2 (two) times daily. As needed      . Magnesium 500 MG TABS Take 1 tablet by mouth daily.      . Multiple Vitamin (MULTIVITAMIN) capsule Take 1 capsule by mouth daily.        Marland Kitchen DISCONTD: Calcium Carbonate (CALCIUM 500 PO) Take 1 tablet by mouth daily.          No Known Allergies  History   Social History  . Marital Status: Married    Spouse Name: N/A    Number of Children: N/A  . Years of Education: N/A   Occupational History  . retired    Social History Main Topics  . Smoking status: Former Smoker -- 1.0 packs/day for 30 years    Types: Cigarettes  . Smokeless tobacco: Not on file   Comment: quit in 1980s  . Alcohol Use: No  . Drug Use: No  . Sexually Active: Not on file   Other Topics Concern  . Not on file   Social History Narrative  . No narrative on file    Family History  Problem Relation Age of Onset  . Stroke    . Anesthesia problems Neg Hx   . Hypotension Neg Hx   . Malignant hyperthermia Neg Hx   .  Pseudochol deficiency Neg Hx    Physical Exam: Filed Vitals:   03/02/12 1103  BP: 110/50  Pulse: 58  Resp: 18  Height: 5\' 2"  (1.575 m)  Weight: 161 lb 1.9 oz (54.94 kg)  SpO2: 96%    GEN- The patient is well appearing, alert and oriented x 3 today.   Head- normocephalic, atraumatic Eyes-  Sclera clear, conjunctiva pink Ears- hearing intact Oropharynx- clear Neck- supple, no JVP Lymph- no cervical lymphadenopathy Lungs- Clear to ausculation bilaterally, normal work of breathing Heart- bradycardic regular rhythm, no murmurs, rubs or gallops, PMI not laterally displaced GI- soft, NT, ND, + BS Extremities- no clubbing, cyanosis, or edema  ekg today reveals sinus bradycardia 47 bpm, otherwise normal ekg  Assessment and Plan:

## 2012-03-02 NOTE — Assessment & Plan Note (Signed)
Stable If dizziness worsens, then she may require PPM implant

## 2012-03-02 NOTE — Patient Instructions (Addendum)
Your physician has recommended you make the following change in your medication: Decrease Flecainide to 50mg  (1/2 tablet) twice daily.  Your physician wants you to follow-up in: 6 months with Dr Johney Frame.  You will receive a reminder letter in the mail two months in advance. If you don't receive a letter, please call our office to schedule the follow-up appointment.

## 2012-04-04 ENCOUNTER — Other Ambulatory Visit: Payer: Self-pay | Admitting: Internal Medicine

## 2012-04-09 ENCOUNTER — Other Ambulatory Visit: Payer: Self-pay | Admitting: Family Medicine

## 2012-04-09 DIAGNOSIS — N6310 Unspecified lump in the right breast, unspecified quadrant: Secondary | ICD-10-CM

## 2012-04-22 ENCOUNTER — Other Ambulatory Visit: Payer: Self-pay

## 2012-04-22 MED ORDER — FLECAINIDE ACETATE 100 MG PO TABS: 50.0000 mg | ORAL_TABLET | Freq: Two times a day (BID) | ORAL | Status: DC

## 2012-05-26 ENCOUNTER — Encounter: Payer: Self-pay | Admitting: Internal Medicine

## 2012-05-29 ENCOUNTER — Other Ambulatory Visit: Payer: Self-pay | Admitting: Family Medicine

## 2012-05-29 ENCOUNTER — Encounter: Payer: Self-pay | Admitting: Internal Medicine

## 2012-05-29 ENCOUNTER — Ambulatory Visit
Admission: RE | Admit: 2012-05-29 | Discharge: 2012-05-29 | Disposition: A | Discharge status: Home / Self Care | Payer: Medicare Other | Source: Ambulatory Visit | Attending: Family Medicine | Admitting: Family Medicine

## 2012-05-29 DIAGNOSIS — N6310 Unspecified lump in the right breast, unspecified quadrant: Secondary | ICD-10-CM

## 2012-06-08 ENCOUNTER — Encounter: Payer: Self-pay | Admitting: Internal Medicine

## 2012-06-08 ENCOUNTER — Ambulatory Visit (INDEPENDENT_AMBULATORY_CARE_PROVIDER_SITE_OTHER): Payer: Medicare Other | Admitting: Internal Medicine

## 2012-06-08 VITALS — BP 116/84 | HR 107 | Ht 63.0 in | Wt 125.0 lb

## 2012-06-08 DIAGNOSIS — I4891 Unspecified atrial fibrillation: Secondary | ICD-10-CM

## 2012-06-08 DIAGNOSIS — I495 Sick sinus syndrome: Secondary | ICD-10-CM

## 2012-06-08 DIAGNOSIS — I10 Essential (primary) hypertension: Secondary | ICD-10-CM

## 2012-06-08 LAB — HEPATIC FUNCTION PANEL
AST: 21 U/L (ref 0–37)
Alkaline Phosphatase: 57 U/L (ref 39–117)
Total Bilirubin: 0.4 mg/dL (ref 0.3–1.2)

## 2012-06-08 MED ORDER — AMIODARONE HCL 200 MG PO TABS: 200.0000 mg | ORAL_TABLET | Freq: Every day | ORAL | Status: DC

## 2012-06-08 NOTE — Assessment & Plan Note (Signed)
Stable No change required today  

## 2012-06-08 NOTE — Assessment & Plan Note (Signed)
She has failed medical therapy with flecainide Therapeutic strategies for afib including medicine and ablation were discussed in detail with the patient today. Options of tikosyn and amiodarone were discussed. R/B/A to each was discussed in detail.   She failed amiodarone prior to her ablation in 2011 but is clear that she would prefer to retry amiodarone rather than try tikosyn or repeat ablation.  I will therefore stop flecainide today.  After a 72 hour washout of flecainide, she will start amiodarone 200mg  BID. She will return in 4 weeks.  If still in afib, she will require repeat cardioversion Lfts/tfts today  Continue pradaxa.

## 2012-06-08 NOTE — Assessment & Plan Note (Signed)
Stable She may eventually require backup pacing support if symptomatic sinus bradycardia occurs We will try to avoid PPM if possible.

## 2012-06-08 NOTE — Progress Notes (Signed)
PCP:  Rudi Heap, MD  The patient presents today for electrophysiology followup.  Since last being seen in our clinic, the patient has developed recurrent afib.  She reports fatigue and palpitations.  Her heart rates at home are mostly 80s-110s.  She has some dizziness/ unsteadiness with flecainide.  Today, she denies symptoms of chest pain, shortness of breath, orthopnea, PND, lower extremity edema, presyncope, or syncope.    Past Medical History  Diagnosis Date  . Hyperlipidemia   . Microcytic anemia   . Osteopenia   . 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 Persistent atrial fibrillation     s/p PVI 04/2010  . Sinus bradycardia   . CVA (cerebral vascular accident) 2007  . Arthritis    Past Surgical History  Procedure Date  . Breast mass resected   . Afib ablation 04/2010    PVI with CTI ablation performed by [facility]  . Cardioversion     for afib  . Cataract extraction w/phaco 11/11/2011    Procedure: CATARACT EXTRACTION PHACO AND INTRAOCULAR LENS PLACEMENT (IOC);  Surgeon: Gemma Payor, MD;  Location: [facility];  Service: Ophthalmology;  Laterality: Left;  CDE:12.62  . Cataract extraction w/phaco 11/25/2011    Procedure: CATARACT EXTRACTION PHACO AND INTRAOCULAR LENS PLACEMENT (IOC);  Surgeon: Gemma Payor, MD;  Location: [facility];  Service: Ophthalmology;  Laterality: Right;  CDE 16.14    Current Outpatient Prescriptions  Medication Sig Dispense Refill  . atorvastatin (LIPITOR) 10 MG tablet Take 5 mg by mouth daily.       . Calcium Carbonate-Vit D-Min 1200-1000 MG-UNIT CHEW Chew 1 tablet by mouth daily.      . chlorhexidine (PERIDEX) 0.12 % solution Use as directed in the mouth or throat daily.       . Cholecalciferol (VITAMIN D) 2000 UNITS CAPS Take 1 capsule by mouth daily.        . dabigatran (PRADAXA) 150 MG CAPS Take 150 mg by mouth every 12 (twelve) hours.      . flecainide (TAMBOCOR) 100 MG tablet Take 0.5 tablets (50 mg total) by mouth 2 (two) times daily.  60  tablet  6  . ipratropium (ATROVENT) 0.06 % nasal spray Place 1 spray into the nose 2 (two) times daily. As needed      . Magnesium 500 MG TABS Take 1 tablet by mouth daily.      . Multiple Vitamin (MULTIVITAMIN) capsule Take 1 capsule by mouth daily.        . [DISCONTINUED] Calcium Carbonate (CALCIUM 500 PO) Take 1 tablet by mouth daily.        . [DISCONTINUED] PRADAXA 150 MG CAPS TAKE ONE CAPSULE BY MOUTH TWICE DAILY  30 capsule  12    No Known Allergies  History   Social History  . Marital Status: Married    Spouse Name: N/A    Number of Children: N/A  . Years of Education: N/A   Occupational History  . retired    Social History Main Topics  . Smoking status: Former Smoker -- 1.0 packs/day for 30 years    Types: Cigarettes  . Smokeless tobacco: Not on file     Comment: quit in 1980s  . Alcohol Use: No  . Drug Use: No  . Sexually Active: Not on file   Other Topics Concern  . Not on file   Social History Narrative  . No narrative on file    Family History  Problem Relation Age  of Onset  . Stroke    . Anesthesia problems Neg Hx   . Hypotension Neg Hx   . Malignant hyperthermia Neg Hx   . Pseudochol deficiency Neg Hx    Physical Exam: Filed Vitals:   06/08/12 0956  BP: 116/84  Pulse: 107  Height: 5\' 3"  (1.6 m)  Weight: 125 lb (56.7 kg)  SpO2: 98%    GEN- The patient is well appearing, alert and oriented x 3 today.   Head- normocephalic, atraumatic Eyes-  Sclera clear, conjunctiva pink Ears- hearing intact Oropharynx- clear Neck- supple, no JVP Lymph- no cervical lymphadenopathy Lungs- Clear to ausculation bilaterally, normal work of breathing Heart- iRRR, no murmurs, rubs or gallops, PMI not laterally displaced GI- soft, NT, ND, + BS Extremities- no clubbing, cyanosis, or edema  ekg 7/13reveals sinus bradycardia 47 bpm, otherwise normal ekg EKG today reveals afib, V rate 107 bpm  Assessment and Plan:

## 2012-06-08 NOTE — Patient Instructions (Signed)
Your physician recommends that you schedule a follow-up appointment in: 4 weeks with Dr Johney Frame     Your physician recommends that you return for lab work today  Your physician has recommended you make the following change in your medication:  1) Stop Flecainide in 72 hours start Amiodarone 200mg  twice daily

## 2012-06-10 ENCOUNTER — Telehealth: Payer: Self-pay | Admitting: Internal Medicine

## 2012-06-10 DIAGNOSIS — I4891 Unspecified atrial fibrillation: Secondary | ICD-10-CM

## 2012-06-10 MED ORDER — AMIODARONE HCL 200 MG PO TABS: 200.0000 mg | ORAL_TABLET | Freq: Two times a day (BID) | ORAL | Status: DC

## 2012-06-10 NOTE — Telephone Encounter (Signed)
It is twice daily  Patient aware  Resent to pharmacy

## 2012-06-10 NOTE — Telephone Encounter (Signed)
New problem:   clarification  Of medication .  Reading instruction stop flecainide , start amiodarone 200 mg twice daily . Down on list of medication it's stated take 1 tablet  by mouth daily.  Her Rx  bottle states the same 1 tablet

## 2012-07-08 ENCOUNTER — Encounter: Payer: Self-pay | Admitting: Internal Medicine

## 2012-07-08 ENCOUNTER — Ambulatory Visit (INDEPENDENT_AMBULATORY_CARE_PROVIDER_SITE_OTHER): Payer: Medicare Other | Admitting: Internal Medicine

## 2012-07-08 VITALS — BP 128/64 | HR 55 | Ht 63.6 in | Wt 125.0 lb

## 2012-07-08 DIAGNOSIS — I10 Essential (primary) hypertension: Secondary | ICD-10-CM

## 2012-07-08 DIAGNOSIS — I4891 Unspecified atrial fibrillation: Secondary | ICD-10-CM

## 2012-07-08 DIAGNOSIS — D508 Other iron deficiency anemias: Secondary | ICD-10-CM

## 2012-07-08 MED ORDER — AMIODARONE HCL 200 MG PO TABS: 200.0000 mg | ORAL_TABLET | Freq: Every day | ORAL | Status: DC

## 2012-07-08 MED ORDER — RIVAROXABAN 20 MG PO TABS: 20.0000 mg | ORAL_TABLET | Freq: Every day | ORAL | Status: DC

## 2012-07-08 NOTE — Assessment & Plan Note (Signed)
Now back in sinus rhythm on amiodarone. Decrease amiodarone to 200mg  daily at this time.  Check LFTs/TFTs Consider decreasing amiodarone to 100mg  daily in the future.  She wishes to switch from pradaxa to xarelto due to once daily dosing advertised on TV.  We discussed pros and cons of pradaxa, coumadin, xarelto, and eliquis.  She remains clear that she would like to switch to xarelto.  I will obtain CBC and BMET at this time and she will switch to xarelto once she has finished her current supply of pradaxa.

## 2012-07-08 NOTE — Assessment & Plan Note (Signed)
Cbc today

## 2012-07-08 NOTE — Assessment & Plan Note (Signed)
Stable No change required today  

## 2012-07-08 NOTE — Progress Notes (Signed)
PCP:  Rudi Heap, MD Primary Cardiologist:  Dr Antoine Poche   The patient presents today for routine electrophysiology followup.  Since last being seen in our clinic, the patient reports doing very well.  She has converted to sinus rhythm with amiodarone.  She is tolerating this medicine well.  She continues to have mild fatigue and SOB which are stable. Today, she denies symptoms of palpitations, chest pain,  orthopnea, PND, lower extremity edema, dizziness, presyncope, syncope, or neurologic sequela.  The patient feels that she is tolerating medications without difficulties and is otherwise without complaint today.   Past Medical History  Diagnosis Date  . Hyperlipidemia   . Microcytic anemia   . Osteopenia   . 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 Persistent atrial fibrillation     s/p PVI 04/2010  . Sinus bradycardia   . CVA (cerebral vascular accident) 2007  . Arthritis    Past Surgical History  Procedure Date  . Breast mass resected   . Afib ablation 04/2010    PVI with CTI ablation performed by [facility]  . Cardioversion     for afib  . Cataract extraction w/phaco 11/11/2011    Procedure: CATARACT EXTRACTION PHACO AND INTRAOCULAR LENS PLACEMENT (IOC);  Surgeon: Gemma Payor, MD;  Location: [facility];  Service: Ophthalmology;  Laterality: Left;  CDE:12.62  . Cataract extraction w/phaco 11/25/2011    Procedure: CATARACT EXTRACTION PHACO AND INTRAOCULAR LENS PLACEMENT (IOC);  Surgeon: Gemma Payor, MD;  Location: [facility];  Service: Ophthalmology;  Laterality: Right;  CDE 16.14    Current Outpatient Prescriptions  Medication Sig Dispense Refill  . amiodarone (PACERONE) 200 MG tablet Take 1 tablet (200 mg total) by mouth daily.  90 tablet  3  . atorvastatin (LIPITOR) 10 MG tablet Take 5 mg by mouth daily.       . Calcium Carbonate-Vit D-Min 1200-1000 MG-UNIT CHEW Chew 1 tablet by mouth daily.      . chlorhexidine (PERIDEX) 0.12 % solution Use as directed in the mouth or  throat daily.       . Cholecalciferol (VITAMIN D) 2000 UNITS CAPS Take 1 capsule by mouth daily.        Marland Kitchen ipratropium (ATROVENT) 0.06 % nasal spray Place 1 spray into the nose 2 (two) times daily. As needed      . Magnesium 500 MG TABS Take 1 tablet by mouth daily.      . Multiple Vitamin (MULTIVITAMIN) capsule Take 1 capsule by mouth daily.        . [DISCONTINUED] amiodarone (PACERONE) 200 MG tablet Take 1 tablet (200 mg total) by mouth 2 (two) times daily.  180 tablet  3  . Rivaroxaban (XARELTO) 20 MG TABS Take 1 tablet (20 mg total) by mouth daily.  30 tablet    . [DISCONTINUED] Calcium Carbonate (CALCIUM 500 PO) Take 1 tablet by mouth daily.          No Known Allergies  History   Social History  . Marital Status: Married    Spouse Name: N/A    Number of Children: N/A  . Years of Education: N/A   Occupational History  . retired    Social History Main Topics  . Smoking status: Former Smoker -- 1.0 packs/day for 30 years    Types: Cigarettes  . Smokeless tobacco: Not on file     Comment: quit in 1980s  . Alcohol Use: No  . Drug Use: No  .  Sexually Active: Not on file   Other Topics Concern  . Not on file   Social History Narrative  . No narrative on file    Family History  Problem Relation Age of Onset  . Stroke    . Anesthesia problems Neg Hx   . Hypotension Neg Hx   . Malignant hyperthermia Neg Hx   . Pseudochol deficiency Neg Hx     Physical Exam: Filed Vitals:   07/08/12 1538  BP: 128/64  Pulse: 55  Height: 5' 3.6" (1.615 m)  Weight: 125 lb (56.7 kg)  SpO2: 98%    GEN- The patient is well appearing, alert and oriented x 3 today.   Head- normocephalic, atraumatic Eyes-  Sclera clear, conjunctiva pink Ears- hearing intact Oropharynx- clear Neck- supple, no JVP Lymph- no cervical lymphadenopathy Lungs- Clear to ausculation bilaterally, normal work of breathing Heart- Regular rate and rhythm, no murmurs, rubs or gallops, PMI not laterally  displaced GI- soft, NT, ND, + BS Extremities- no clubbing, cyanosis, or edema  ekg today reveals sinus rhythm 55 bpm, otherwise normal ekg   Assessment and Plan:

## 2012-07-08 NOTE — Patient Instructions (Signed)
Your physician recommends that you schedule a follow-up appointment in: 2 months with Dr Johney Frame  Your physician has recommended you make the following change in your medication:  1) Stop Pradaxa after what you have is gone and change to Xarelto 20mg  daily  Your physician has recommended you make the following change in your medication:  1) Decrease Amiodarone to 200mg  daily  Your physician recommends that you return for lab work BMP/CBC/Liver/TSH/ Free T4

## 2012-07-09 LAB — CBC WITH DIFFERENTIAL/PLATELET
Basophils Absolute: 0 10*3/uL (ref 0.0–0.1)
HCT: 35.4 % — ABNORMAL LOW (ref 36.0–46.0)
Lymphs Abs: 2.1 10*3/uL (ref 0.7–4.0)
MCHC: 32.4 g/dL (ref 30.0–36.0)
MCV: 96.4 fl (ref 78.0–100.0)
Monocytes Absolute: 0.8 10*3/uL (ref 0.1–1.0)
Platelets: 258 10*3/uL (ref 150.0–400.0)
RDW: 13.8 % (ref 11.5–14.6)

## 2012-07-10 LAB — BASIC METABOLIC PANEL
BUN: 14 mg/dL (ref 6–23)
Chloride: 105 mEq/L (ref 96–112)
Glucose, Bld: 96 mg/dL (ref 70–99)
Potassium: 4.2 mEq/L (ref 3.5–5.1)

## 2012-07-10 LAB — HEPATIC FUNCTION PANEL
ALT: 29 U/L (ref 0–35)
Total Bilirubin: 0.4 mg/dL (ref 0.3–1.2)

## 2012-07-10 LAB — T4, FREE: Free T4: 1.38 ng/dL (ref 0.60–1.60)

## 2012-07-10 LAB — TSH: TSH: 1.78 u[IU]/mL (ref 0.35–5.50)

## 2012-07-13 ENCOUNTER — Telehealth: Payer: Self-pay | Admitting: Internal Medicine

## 2012-07-13 ENCOUNTER — Encounter: Payer: Self-pay | Admitting: *Deleted

## 2012-07-13 NOTE — Telephone Encounter (Signed)
Patient returning nurse call, she can be reached at hm#  [phone]

## 2012-07-13 NOTE — Telephone Encounter (Signed)
This encounter was created in error - please disregard.

## 2012-07-13 NOTE — Telephone Encounter (Signed)
Pt aware of her lab results.

## 2012-08-01 ENCOUNTER — Emergency Department (HOSPITAL_BASED_OUTPATIENT_CLINIC_OR_DEPARTMENT_OTHER): Payer: Medicare Other

## 2012-08-01 ENCOUNTER — Encounter (HOSPITAL_BASED_OUTPATIENT_CLINIC_OR_DEPARTMENT_OTHER): Payer: Self-pay | Admitting: *Deleted

## 2012-08-01 ENCOUNTER — Emergency Department (HOSPITAL_BASED_OUTPATIENT_CLINIC_OR_DEPARTMENT_OTHER)
Admission: EM | Admit: 2012-08-01 | Discharge: 2012-08-01 | Disposition: A | Discharge status: Home / Self Care | Payer: Medicare Other | Attending: Emergency Medicine | Admitting: Emergency Medicine

## 2012-08-01 DIAGNOSIS — Z79899 Other long term (current) drug therapy: Secondary | ICD-10-CM | POA: Insufficient documentation

## 2012-08-01 DIAGNOSIS — Z8679 Personal history of other diseases of the circulatory system: Secondary | ICD-10-CM | POA: Insufficient documentation

## 2012-08-01 DIAGNOSIS — Y92009 Unspecified place in unspecified non-institutional (private) residence as the place of occurrence of the external cause: Secondary | ICD-10-CM | POA: Insufficient documentation

## 2012-08-01 DIAGNOSIS — Z8673 Personal history of transient ischemic attack (TIA), and cerebral infarction without residual deficits: Secondary | ICD-10-CM | POA: Insufficient documentation

## 2012-08-01 DIAGNOSIS — Z862 Personal history of diseases of the blood and blood-forming organs and certain disorders involving the immune mechanism: Secondary | ICD-10-CM | POA: Insufficient documentation

## 2012-08-01 DIAGNOSIS — W1809XA Striking against other object with subsequent fall, initial encounter: Secondary | ICD-10-CM | POA: Insufficient documentation

## 2012-08-01 DIAGNOSIS — R42 Dizziness and giddiness: Secondary | ICD-10-CM | POA: Insufficient documentation

## 2012-08-01 DIAGNOSIS — S0990XA Unspecified injury of head, initial encounter: Secondary | ICD-10-CM | POA: Insufficient documentation

## 2012-08-01 DIAGNOSIS — S0180XA Unspecified open wound of other part of head, initial encounter: Secondary | ICD-10-CM | POA: Insufficient documentation

## 2012-08-01 DIAGNOSIS — W19XXXA Unspecified fall, initial encounter: Secondary | ICD-10-CM

## 2012-08-01 DIAGNOSIS — Z87891 Personal history of nicotine dependence: Secondary | ICD-10-CM | POA: Insufficient documentation

## 2012-08-01 DIAGNOSIS — Z8739 Personal history of other diseases of the musculoskeletal system and connective tissue: Secondary | ICD-10-CM | POA: Insufficient documentation

## 2012-08-01 DIAGNOSIS — E785 Hyperlipidemia, unspecified: Secondary | ICD-10-CM | POA: Insufficient documentation

## 2012-08-01 DIAGNOSIS — Y939 Activity, unspecified: Secondary | ICD-10-CM | POA: Insufficient documentation

## 2012-08-01 LAB — CBC
MCH: 31.3 pg (ref 26.0–34.0)
MCHC: 33.2 g/dL (ref 30.0–36.0)
Platelets: 206 10*3/uL (ref 150–400)
RBC: 4.06 MIL/uL (ref 3.87–5.11)
RDW: 12.7 % (ref 11.5–15.5)

## 2012-08-01 LAB — BASIC METABOLIC PANEL
CO2: 25 mEq/L (ref 19–32)
Calcium: 9.7 mg/dL (ref 8.4–10.5)
GFR calc Af Amer: 69 mL/min — ABNORMAL LOW (ref >90)
GFR calc non Af Amer: 59 mL/min — ABNORMAL LOW (ref >90)
Sodium: 139 mEq/L (ref 135–145)

## 2012-08-01 LAB — URINALYSIS, ROUTINE W REFLEX MICROSCOPIC
Bilirubin Urine: NEGATIVE
Nitrite: NEGATIVE
Specific Gravity, Urine: 1.009 (ref 1.005–1.030)
Urobilinogen, UA: 0.2 mg/dL (ref 0.0–1.0)

## 2012-08-01 MED ORDER — OXYCODONE-ACETAMINOPHEN 5-325 MG PO TABS
1.0000 | ORAL_TABLET | Freq: Once | ORAL | Status: DC
  Filled 2012-08-01: qty 1

## 2012-08-01 MED ORDER — TRAMADOL HCL 50 MG PO TABS: 50.0000 mg | ORAL_TABLET | Freq: Four times a day (QID) | ORAL | Status: DC | PRN

## 2012-08-01 MED ORDER — LIDOCAINE HCL (PF) 1 % IJ SOLN
INTRAMUSCULAR | Status: AC
  Filled 2012-08-01: qty 5

## 2012-08-01 MED ORDER — LIDOCAINE HCL 2 % IJ SOLN
10.0000 mL | Freq: Once | INTRAMUSCULAR | Status: AC
  Administered 2012-08-01: 200 mg via INTRADERMAL

## 2012-08-01 MED ORDER — ACETAMINOPHEN 325 MG PO TABS
650.0000 mg | ORAL_TABLET | Freq: Once | ORAL | Status: AC
  Administered 2012-08-01: 650 mg via ORAL
  Filled 2012-08-01: qty 2

## 2012-08-01 NOTE — ED Provider Notes (Signed)
History     CSN: [id number]  Arrival date & time 08/01/12  1127   First MD Initiated Contact with Patient 08/01/12 1301      Chief Complaint  Patient presents with  . Fall    (Consider location/radiation/quality/duration/timing/severity/associated sxs/prior treatment) Patient is a 78 y.o. female presenting with fall. The history is provided by the patient. No language interpreter was used.  Fall The accident occurred 6 to 12 hours ago. The fall occurred while walking. The pain is at a severity of 5/10 (L ribs   R eye 3/10). Associated symptoms include nausea. Pertinent negatives include no visual change, no fever, no numbness, no vomiting, no headaches and no loss of consciousness.   78 year old female coming in after she got up at 59 and his the bathroom felt dizzy sitting up and fell hit her head on the dresser in her left rib on the floor. Laceration to right eyebrow bleeding controlled. Patient put Steri-Strips on her laceration. Past medical history of atrial fib with ablation, hyperlipidemia., Anemia, AV malformation, sinus bradycardia, CVA. Patient denies dizziness presently. Denies loss of consciousness. Past Medical History  Diagnosis Date  . Hyperlipidemia   . Microcytic anemia   . Osteopenia   . 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 Persistent atrial fibrillation     s/p PVI 04/2010  . Sinus bradycardia   . CVA (cerebral vascular accident) 2007  . Arthritis     Past Surgical History  Procedure Date  . Breast mass resected   . Afib ablation 04/2010    PVI with CTI ablation performed by [facility]  . Cardioversion     for afib  . Cataract extraction w/phaco 11/11/2011    Procedure: CATARACT EXTRACTION PHACO AND INTRAOCULAR LENS PLACEMENT (IOC);  Surgeon: Gemma Payor, MD;  Location: [facility];  Service: Ophthalmology;  Laterality: Left;  CDE:12.62  . Cataract extraction w/phaco 11/25/2011    Procedure: CATARACT EXTRACTION PHACO AND INTRAOCULAR LENS PLACEMENT  (IOC);  Surgeon: Gemma Payor, MD;  Location: [facility];  Service: Ophthalmology;  Laterality: Right;  CDE 16.14    Family History  Problem Relation Age of Onset  . Stroke    . Anesthesia problems Neg Hx   . Hypotension Neg Hx   . Malignant hyperthermia Neg Hx   . Pseudochol deficiency Neg Hx     History  Substance Use Topics  . Smoking status: Former Smoker -- 1.0 packs/day for 30 years    Types: Cigarettes  . Smokeless tobacco: Not on file     Comment: quit in 1980s  . Alcohol Use: No    OB History    Grav Para Term Preterm Abortions TAB SAB Ect Mult Living                  Review of Systems  Constitutional: Negative.  Negative for fever.  HENT: Negative.   Eyes: Negative.  Negative for photophobia.  Respiratory: Negative.   Cardiovascular: Negative.        Left rib pain  Gastrointestinal: Positive for nausea. Negative for vomiting.  Genitourinary: Positive for frequency. Negative for urgency.  Neurological: Negative.  Negative for loss of consciousness, weakness, numbness and headaches.  Psychiatric/Behavioral: Negative.   All other systems reviewed and are negative.    Allergies  Review of patient's allergies indicates no known allergies.  Home Medications   Current Outpatient Rx  Name  Route  Sig  Dispense  Refill  . AMIODARONE HCL 200 MG  PO TABS   Oral   Take 1 tablet (200 mg total) by mouth daily.   90 tablet   3   . ATORVASTATIN CALCIUM 10 MG PO TABS   Oral   Take 5 mg by mouth daily.          Marland Kitchen CALCIUM CARBONATE-VIT D-MIN 1200-1000 MG-UNIT PO CHEW   Oral   Chew 1 tablet by mouth daily.         . CHLORHEXIDINE GLUCONATE 0.12 % MT SOLN   Mouth/Throat   Use as directed in the mouth or throat daily.          Marland Kitchen VITAMIN D 2000 UNITS PO CAPS   Oral   Take 1 capsule by mouth daily.           . IPRATROPIUM BROMIDE 0.06 % NA SOLN   Nasal   Place 1 spray into the nose 2 (two) times daily. As needed         . MAGNESIUM 500 MG PO TABS    Oral   Take 1 tablet by mouth daily.         . MULTIVITAMINS PO CAPS   Oral   Take 1 capsule by mouth daily.           Marland Kitchen RIVAROXABAN 20 MG PO TABS   Oral   Take 1 tablet (20 mg total) by mouth daily.   30 tablet        BP 177/62  Pulse 53  Temp 97.8 F (36.6 C) (Oral)  SpO2 95%  Physical Exam  Nursing note and vitals reviewed. Constitutional: She is oriented to person, place, and time. She appears well-developed and well-nourished.  HENT:  Head: Normocephalic and atraumatic.  Eyes: Conjunctivae normal and EOM are normal. Pupils are equal, round, and reactive to light.  Neck: Normal range of motion. Neck supple.  Cardiovascular: Normal rate.   Pulmonary/Chest: Effort normal and breath sounds normal. No respiratory distress.       Chest bruising to the left ribs and left flank. Laceration to the right eyebrow 4 cm bleeding controlled.  Abdominal: Soft. She exhibits no distension. There is no tenderness.  Musculoskeletal: Normal range of motion. She exhibits no edema and no tenderness.  Neurological: She is alert and oriented to person, place, and time. She has normal reflexes.  Skin: Skin is warm and dry.  Psychiatric: She has a normal mood and affect.    ED Course  LACERATION REPAIR Date/Time: 08/01/2012 3:42 PM Performed by: Remi Haggard Authorized by: Remi Haggard Consent: Verbal consent not obtained. Written consent not obtained. Risks and benefits: risks, benefits and alternatives were discussed Consent given by: patient Patient understanding: patient states understanding of the procedure being performed Patient identity confirmed: verbally with patient, arm band, provided demographic data and hospital-assigned identification number Time out: Immediately prior to procedure a "time out" was called to verify the correct patient, procedure, equipment, support staff and site/side marked as required. Body area: head/neck Location details: right  eyebrow Laceration length: 3 cm Tendon involvement: none Nerve involvement: none Vascular damage: no Anesthesia: local infiltration Local anesthetic: lidocaine 2% without epinephrine Anesthetic total: 3 ml Patient sedated: no Preparation: Patient was prepped and draped in the usual sterile fashion. Irrigation solution: saline Irrigation method: jet lavage Amount of cleaning: standard Debridement: none Skin closure: 6-0 Prolene Number of sutures: 4 Technique: simple Approximation difficulty: simple Dressing: 4x4 sterile gauze Patient tolerance: Patient tolerated the procedure well with no immediate complications.   (including  critical care time)   Labs Reviewed  URINALYSIS, ROUTINE W REFLEX MICROSCOPIC  CBC  BASIC METABOLIC PANEL   No results found.   No diagnosis found.    MDM  Fall with laceration to L eyebrow and brusing to L ribs/flank.  Incentive spirometer given to patient to use every 1 hour while awake.  Chest x-ray shows LLL atelectasis  Reviewed by myself and Dr. Bernette Mayers.  She will followup with her primary care provider on Monday. This is a sheered visit with Dr. Bernette Mayers.  Date: 08/01/2012  Rate: 51  Rhythm: sb  QRS Axis: normal  Intervals: normal  ST/T Wave abnormalities: normal  Conduction Disutrbances:none  Narrative Interpretation:   Old EKG Reviewed: unchanged  Labs Reviewed  CBC - Abnormal; Notable for the following:    WBC 13.1 (*)     All other components within normal limits  BASIC METABOLIC PANEL - Abnormal; Notable for the following:    GFR calc non Af Amer 59 (*)     GFR calc Af Amer 69 (*)     All other components within normal limits  URINALYSIS, ROUTINE W REFLEX MICROSCOPIC         Remi Haggard, NP 08/01/12 1547

## 2012-08-01 NOTE — ED Notes (Addendum)
Larey Seat this morning because of dizziness. Hit her head on a dresser and has a laceration to her right eye. Also c/o left side pain from fall.

## 2012-08-03 ENCOUNTER — Telehealth: Payer: Self-pay | Admitting: Internal Medicine

## 2012-08-03 DIAGNOSIS — I4891 Unspecified atrial fibrillation: Secondary | ICD-10-CM

## 2012-08-03 MED ORDER — RIVAROXABAN 20 MG PO TABS: 20.0000 mg | ORAL_TABLET | Freq: Every day | ORAL | Status: DC

## 2012-08-03 NOTE — Telephone Encounter (Signed)
plz return call to pt [phone] to discuss medication change.

## 2012-08-03 NOTE — Telephone Encounter (Signed)
She will start Xarelto in 5 days

## 2012-08-09 NOTE — ED Provider Notes (Signed)
Medical screening examination/treatment/procedure(s) were performed by non-physician practitioner and as supervising physician I was immediately available for consultation/collaboration.   Genevive Printup B. Bernette Mayers, MD 08/09/12 1023

## 2012-09-07 ENCOUNTER — Encounter: Payer: Self-pay | Admitting: Internal Medicine

## 2012-09-15 ENCOUNTER — Encounter: Payer: Self-pay | Admitting: Internal Medicine

## 2012-09-16 ENCOUNTER — Ambulatory Visit: Payer: Medicare Other | Admitting: Internal Medicine

## 2012-09-24 ENCOUNTER — Ambulatory Visit (INDEPENDENT_AMBULATORY_CARE_PROVIDER_SITE_OTHER): Payer: Medicare PPO | Admitting: Internal Medicine

## 2012-09-24 ENCOUNTER — Encounter: Payer: Self-pay | Admitting: Internal Medicine

## 2012-09-24 VITALS — BP 139/69 | HR 47 | Ht 63.5 in | Wt 126.0 lb

## 2012-09-24 DIAGNOSIS — I495 Sick sinus syndrome: Secondary | ICD-10-CM

## 2012-09-24 LAB — BASIC METABOLIC PANEL
BUN: 16 mg/dL (ref 6–23)
CO2: 27 mEq/L (ref 19–32)
Chloride: 103 mEq/L (ref 96–112)
Glucose, Bld: 84 mg/dL (ref 70–99)
Potassium: 4.7 mEq/L (ref 3.5–5.1)

## 2012-09-24 LAB — CBC WITH DIFFERENTIAL/PLATELET
Basophils Absolute: 0 10*3/uL (ref 0.0–0.1)
Lymphocytes Relative: 21.5 % (ref 12.0–46.0)
Lymphs Abs: 1.7 10*3/uL (ref 0.7–4.0)
Monocytes Relative: 12.9 % — ABNORMAL HIGH (ref 3.0–12.0)
Platelets: 224 10*3/uL (ref 150.0–400.0)
RDW: 14 % (ref 11.5–14.6)

## 2012-09-24 LAB — HEPATIC FUNCTION PANEL
ALT: 16 U/L (ref 0–35)
AST: 24 U/L (ref 0–37)
Albumin: 3.5 g/dL (ref 3.5–5.2)

## 2012-09-24 LAB — TSH: TSH: 2.66 u[IU]/mL (ref 0.35–5.50)

## 2012-09-24 LAB — T4, FREE: Free T4: 1.31 ng/dL (ref 0.60–1.60)

## 2012-09-24 MED ORDER — AMIODARONE HCL 200 MG PO TABS: 100.0000 mg | ORAL_TABLET | Freq: Every day | ORAL | Status: DC

## 2012-09-24 MED ORDER — AMIODARONE HCL 100 MG PO TABS: 100.0000 mg | ORAL_TABLET | Freq: Every day | ORAL | Status: DC

## 2012-09-24 NOTE — Patient Instructions (Addendum)
Decrease Amiodarone to 100mg  daily.  LABS TODAY:  BMET, LIVER, TSH, T4, CBC  Your physician recommends that you schedule a follow-up appointment in: 4 months with Dr. Johney Frame.

## 2012-09-27 NOTE — Assessment & Plan Note (Signed)
She continues to have some SOB and fatigue.   This may be due to bradycardia.  She would like to avoid PPM. I will decrease amiodarone to 100mg  daily  If her symptoms worsen she may be more amenable to pacemaker at that time

## 2012-09-27 NOTE — Assessment & Plan Note (Signed)
Maintaining sinus rhythm Decrease amiodarone to 100mg  daily

## 2012-09-27 NOTE — Progress Notes (Signed)
PCP:  Rudi Heap, MD Primary Cardiologist:  Dr Antoine Poche  The patient presents today for routine electrophysiology followup.  Since last being seen in our clinic, the patient reports doing very well.  She remains in sinus rhythm with amiodarone.  She is tolerating this medicine well.  Her SOB is slightly improved.  Today, she denies symptoms of palpitations, chest pain,  orthopnea, PND, lower extremity edema, dizziness, presyncope, syncope, or neurologic sequela.  The patient feels that she is tolerating medications without difficulties and is otherwise without complaint today.   Past Medical History  Diagnosis Date  . Hyperlipidemia   . Microcytic anemia   . Osteopenia   . 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 Persistent atrial fibrillation     s/p PVI 04/2010  . Sinus bradycardia   . CVA (cerebral vascular accident) 2007  . Arthritis    Past Surgical History  Procedure Laterality Date  . Breast mass resected    . Afib ablation  04/2010    PVI with CTI ablation performed by [facility]  . Cardioversion      for afib  . Cataract extraction w/phaco  11/11/2011    Procedure: CATARACT EXTRACTION PHACO AND INTRAOCULAR LENS PLACEMENT (IOC);  Surgeon: Gemma Payor, MD;  Location: [facility];  Service: Ophthalmology;  Laterality: Left;  CDE:12.62  . Cataract extraction w/phaco  11/25/2011    Procedure: CATARACT EXTRACTION PHACO AND INTRAOCULAR LENS PLACEMENT (IOC);  Surgeon: Gemma Payor, MD;  Location: [facility];  Service: Ophthalmology;  Laterality: Right;  CDE 16.14    Current Outpatient Prescriptions  Medication Sig Dispense Refill  . atorvastatin (LIPITOR) 10 MG tablet Take 5 mg by mouth daily.       . Calcium Carbonate-Vit D-Min 1200-1000 MG-UNIT CHEW Chew 1 tablet by mouth daily.      . chlorhexidine (PERIDEX) 0.12 % solution Use as directed in the mouth or throat daily.       . Cholecalciferol (VITAMIN D) 2000 UNITS CAPS Take 1 capsule by mouth daily.        Marland Kitchen ipratropium  (ATROVENT) 0.06 % nasal spray Place 1 spray into the nose 2 (two) times daily. As needed      . Magnesium 500 MG TABS Take 1 tablet by mouth daily.      . Multiple Vitamin (MULTIVITAMIN) capsule Take 1 capsule by mouth daily.        Marland Kitchen PRADAXA 150 MG CAPS Take 1 capsule by mouth 2 (two) times daily.      Marland Kitchen amiodarone (PACERONE) 200 MG tablet Take 0.5 tablets (100 mg total) by mouth daily.  90 tablet  3  . [DISCONTINUED] Calcium Carbonate (CALCIUM 500 PO) Take 1 tablet by mouth daily.         No current facility-administered medications for this visit.    No Known Allergies  History   Social History  . Marital Status: Married    Spouse Name: N/A    Number of Children: N/A  . Years of Education: N/A   Occupational History  . retired    Social History Main Topics  . Smoking status: Former Smoker -- 1.00 packs/day for 30 years    Types: Cigarettes  . Smokeless tobacco: Not on file     Comment: quit in 1980s  . Alcohol Use: No  . Drug Use: No  . Sexually Active: Not on file   Other Topics Concern  . Not on file   Social History  Narrative  . No narrative on file    Family History  Problem Relation Age of Onset  . Stroke    . Anesthesia problems Neg Hx   . Hypotension Neg Hx   . Malignant hyperthermia Neg Hx   . Pseudochol deficiency Neg Hx     Physical Exam: Filed Vitals:   09/24/12 1149  BP: 139/69  Pulse: 47  Height: 5' 3.5" (1.613 m)  Weight: 126 lb (57.153 kg)    GEN- The patient is well appearing, alert and oriented x 3 today.   Head- normocephalic, atraumatic Eyes-  Sclera clear, conjunctiva pink Ears- hearing intact Oropharynx- clear Neck- supple, no JVP Lymph- no cervical lymphadenopathy Lungs- Clear to ausculation bilaterally, normal work of breathing Heart- Regular rate and rhythm, no murmurs, rubs or gallops, PMI not laterally displaced GI- soft, NT, ND, + BS Extremities- no clubbing, cyanosis, or edema  ekg today reveals sinus rhythm 45 bpm,  otherwise normal ekg   Assessment and Plan:

## 2012-12-07 ENCOUNTER — Other Ambulatory Visit: Payer: Self-pay | Admitting: *Deleted

## 2012-12-07 MED ORDER — IPRATROPIUM BROMIDE 0.06 % NA SOLN: 1.0000 | Freq: Two times a day (BID) | NASAL | Status: DC

## 2013-01-07 ENCOUNTER — Other Ambulatory Visit: Payer: Self-pay | Admitting: Family Medicine

## 2013-01-13 ENCOUNTER — Ambulatory Visit (INDEPENDENT_AMBULATORY_CARE_PROVIDER_SITE_OTHER): Payer: Medicare PPO | Admitting: Family Medicine

## 2013-01-13 ENCOUNTER — Encounter: Payer: Self-pay | Admitting: Family Medicine

## 2013-01-13 ENCOUNTER — Ambulatory Visit (INDEPENDENT_AMBULATORY_CARE_PROVIDER_SITE_OTHER): Payer: Medicare PPO

## 2013-01-13 VITALS — BP 131/96 | HR 95 | Temp 97.6°F | Ht 62.75 in | Wt 126.0 lb

## 2013-01-13 DIAGNOSIS — Z79899 Other long term (current) drug therapy: Secondary | ICD-10-CM

## 2013-01-13 DIAGNOSIS — N390 Urinary tract infection, site not specified: Secondary | ICD-10-CM

## 2013-01-13 DIAGNOSIS — E785 Hyperlipidemia, unspecified: Secondary | ICD-10-CM

## 2013-01-13 DIAGNOSIS — I4891 Unspecified atrial fibrillation: Secondary | ICD-10-CM

## 2013-01-13 DIAGNOSIS — E559 Vitamin D deficiency, unspecified: Secondary | ICD-10-CM

## 2013-01-13 DIAGNOSIS — D649 Anemia, unspecified: Secondary | ICD-10-CM

## 2013-01-13 DIAGNOSIS — R350 Frequency of micturition: Secondary | ICD-10-CM

## 2013-01-13 LAB — HEPATIC FUNCTION PANEL
ALT: 11 U/L (ref 0–35)
AST: 22 U/L (ref 0–37)
Albumin: 4.1 g/dL (ref 3.5–5.2)
Bilirubin, Direct: 0.2 mg/dL (ref 0.0–0.3)
Total Protein: 7 g/dL (ref 6.0–8.3)

## 2013-01-13 LAB — BASIC METABOLIC PANEL WITH GFR
BUN: 15 mg/dL (ref 6–23)
Creat: 0.97 mg/dL (ref 0.50–1.10)
GFR, Est African American: 64 mL/min
GFR, Est Non African American: 56 mL/min — ABNORMAL LOW
Glucose, Bld: 78 mg/dL (ref 70–99)

## 2013-01-13 LAB — POCT UA - MICROSCOPIC ONLY: Crystals, Ur, HPF, POC: NEGATIVE

## 2013-01-13 LAB — POCT URINALYSIS DIPSTICK
Blood, UA: NEGATIVE
Glucose, UA: NEGATIVE
Nitrite, UA: NEGATIVE
Protein, UA: NEGATIVE
Urobilinogen, UA: NEGATIVE

## 2013-01-13 LAB — POCT CBC
Granulocyte percent: 63.4 %G (ref 37–80)
Lymph, poc: 2.4 (ref 0.6–3.4)
MCH, POC: 31.8 pg — AB (ref 27–31.2)
MCHC: 34 g/dL (ref 31.8–35.4)
MCV: 93.5 fL (ref 80–97)
Platelet Count, POC: 223 10*3/uL (ref 142–424)
RDW, POC: 13.9 %
WBC: 7.6 10*3/uL (ref 4.6–10.2)

## 2013-01-13 NOTE — Addendum Note (Signed)
Addended by: Orma Render F on: 01/13/2013 05:50 PM   Modules accepted: Orders

## 2013-01-13 NOTE — Patient Instructions (Addendum)
Fall precautions discussed Continue current meds and therapeutic lifestyle changes Patient needs pelvic exam.--- This will be scheduled .

## 2013-01-13 NOTE — Progress Notes (Signed)
  Subjective:    Patient ID: [name], female    DOB: [birth year], 79 y.o.   MRN: [id number]  HPI Patient comes in for followup of chronic medical problems. She has a history of atrial fibrillation. She notes that she's had some heart irregularity recently. Also see below review of systems. As of note she is taking pradaxa for atrial fibrillation. She will be seeing Dr. Johney Frame again soon. As of note she is getting her eye exams regularly.   Review of Systems  HENT: Positive for postnasal drip (slight).   Eyes: Negative.   Cardiovascular: Positive for palpitations (a fib).  Gastrointestinal: Negative.   Endocrine: Negative.   Genitourinary: Positive for urgency and frequency. Negative for dysuria.  Musculoskeletal: Positive for myalgias (bliateral lower legs) and arthralgias (arthritis, L hand).  Skin: Negative.   Neurological: Positive for dizziness (intermitent with movement). Negative for headaches.  Hematological: Bruises/bleeds easily (due to meds).  Psychiatric/Behavioral: Negative.        Objective:   Physical Exam BP 131/96  Pulse 95  Temp(Src) 97.6 F (36.4 C) (Oral)  Ht 5' 2.75" (1.594 m)  Wt 126 lb (57.153 kg)  BMI 22.49 kg/m2  The patient appeared well nourished and normally developed for her age, alert and oriented to time and place. Speech, behavior and judgement appear normal. Vital signs as documented.  Head exam is unremarkable. No scleral icterus or pallor noted. Some slight nasal congestion bilaterally. She has a tiny speck which looks like a tiny hemorrhage on her left soft palate anterior to the tonsillar area.  Neck is without jugular venous distension, thyromegally, or carotid bruits. Carotid upstrokes are brisk bilaterally. No cervical adenopathy. Lungs are clear anteriorly and posteriorly to auscultation. Normal respiratory effort. Cardiac exam reveals irregular irregular rate and rhythm at 84 per minute. First and second heart sounds normal.  No murmurs, rubs or gallops.  Abdominal exam reveals normal bowl sounds, no masses, no organomegaly and no aortic enlargement. No inguinal adenopathy. Extremities are nonedematous and both femoral and pedal pulses are normal. Skin without pallor or jaundice.  Warm and dry, without rash. She does have some bruising on her upper arms and different areas of her body. Neurologic exam reveals normal deep tendon reflexes and normal sensation.   EKG: Atrial fib flutter [facility] reading (PRIMARY) by  Dr.Moore; CXR:  Within normal limits                             Repeat blood pressure; 151/87       Assessment & Plan:  1. Anemia - POCT CBC; Standing  2. Hyperlipemia - Hepatic function panel; Standing - NMR Lipoprofile with Lipids; Standing  3. Vitamin D deficiency - Vitamin D 25 hydroxy; Standing  4. High risk medication use - BASIC METABOLIC PANEL WITH GFR; Standing  5. Frequency of urination - POCT urinalysis dipstick - POCT UA - Microscopic Only  6. Afib - EKG 12-Lead - DG Chest 2 View; Future   Patient Instructions  Fall precautions discussed Continue current meds and therapeutic lifestyle changes Patient needs pelvic exam.--- This will be scheduled .

## 2013-01-14 LAB — NMR LIPOPROFILE WITH LIPIDS
HDL Size: 10.1 nm (ref >=9.2)
LDL (calc): 68 mg/dL (ref <100)
LDL Particle Number: 526 nmol/L (ref <1000)
LDL Size: 20.4 nm — ABNORMAL LOW (ref >20.5)
LP-IR Score: 26 (ref <=45)
Large VLDL-P: 1.8 nmol/L (ref <=2.7)
Small LDL Particle Number: 283 nmol/L (ref <=527)
VLDL Size: 45.6 nm (ref <=46.6)

## 2013-01-14 NOTE — Addendum Note (Signed)
Addended by: Orma Render F on: 01/14/2013 05:55 PM   Modules accepted: Orders

## 2013-01-15 NOTE — Addendum Note (Signed)
Addended by: Lisbeth Ply C on: 01/15/2013 10:50 AM   Modules accepted: Orders

## 2013-01-25 ENCOUNTER — Encounter: Payer: Self-pay | Admitting: Internal Medicine

## 2013-01-25 ENCOUNTER — Ambulatory Visit (INDEPENDENT_AMBULATORY_CARE_PROVIDER_SITE_OTHER): Payer: Medicare PPO | Admitting: Internal Medicine

## 2013-01-25 VITALS — BP 123/72 | HR 99 | Ht 63.0 in | Wt 126.8 lb

## 2013-01-25 DIAGNOSIS — I495 Sick sinus syndrome: Secondary | ICD-10-CM

## 2013-01-25 DIAGNOSIS — I10 Essential (primary) hypertension: Secondary | ICD-10-CM

## 2013-01-25 DIAGNOSIS — I4891 Unspecified atrial fibrillation: Secondary | ICD-10-CM

## 2013-01-25 MED ORDER — AMIODARONE HCL 200 MG PO TABS: 200.0000 mg | ORAL_TABLET | Freq: Every day | ORAL | Status: DC

## 2013-01-25 NOTE — Progress Notes (Signed)
PCP:  Rudi Heap, MD Primary Cardiologist:  Dr Antoine Poche  The patient presents today for routine electrophysiology followup.  Since last being seen in our clinic, the patient reports doing reasonably well.  She remains active and continues to dance and enjoy living.  She is excited about turning 82 tomorrow.  She has had palpitations for several weeks and thinks that she is back in afib.  Today, she denies symptoms of chest pain, orthopnea, PND, lower extremity edema, dizziness, presyncope, syncope, or neurologic sequela.  The patient feels that she is tolerating medications without difficulties and is otherwise without complaint today.   Past Medical History  Diagnosis Date  . Hyperlipidemia   . Microcytic anemia   . Osteopenia   . 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 Persistent atrial fibrillation     s/p PVI 04/2010  . Sinus bradycardia   . CVA (cerebral vascular accident) 2007  . Arthritis    Past Surgical History  Procedure Laterality Date  . Breast mass resected    . Afib ablation  04/2010    PVI with CTI ablation performed by [facility]  . Cardioversion      for afib  . Cataract extraction w/phaco  11/11/2011    Procedure: CATARACT EXTRACTION PHACO AND INTRAOCULAR LENS PLACEMENT (IOC);  Surgeon: Gemma Payor, MD;  Location: [facility];  Service: Ophthalmology;  Laterality: Left;  CDE:12.62  . Cataract extraction w/phaco  11/25/2011    Procedure: CATARACT EXTRACTION PHACO AND INTRAOCULAR LENS PLACEMENT (IOC);  Surgeon: Gemma Payor, MD;  Location: [facility];  Service: Ophthalmology;  Laterality: Right;  CDE 16.14    Current Outpatient Prescriptions  Medication Sig Dispense Refill  . amiodarone (PACERONE) 200 MG tablet Take 1 tablet (200 mg total) by mouth daily.  90 tablet  3  . atorvastatin (LIPITOR) 10 MG tablet Take 5 mg by mouth daily.       . Calcium Carbonate-Vit D-Min 1200-1000 MG-UNIT CHEW Chew 1 tablet by mouth daily.      . chlorhexidine (PERIDEX) 0.12 %  solution Use as directed in the mouth or throat daily.       . Cholecalciferol (VITAMIN D) 2000 UNITS CAPS Take 1 capsule by mouth daily.        Marland Kitchen ipratropium (ATROVENT) 0.06 % nasal spray PLACE 1 SPRAY INTO THE NOSE 2 (TWO) TIMES DAILY. AS NEEDED  15 mL  1  . Magnesium 500 MG TABS Take 1 tablet by mouth daily.      . Multiple Vitamin (MULTIVITAMIN) capsule Take 1 capsule by mouth daily.        Marland Kitchen PRADAXA 150 MG CAPS Take 1 capsule by mouth 2 (two) times daily.      . [DISCONTINUED] Calcium Carbonate (CALCIUM 500 PO) Take 1 tablet by mouth daily.         No current facility-administered medications for this visit.    No Known Allergies  History   Social History  . Marital Status: Widowed    Spouse Name: N/A    Number of Children: N/A  . Years of Education: N/A   Occupational History  . retired    Social History Main Topics  . Smoking status: Former Smoker -- 1.00 packs/day for 30 years    Types: Cigarettes  . Smokeless tobacco: Not on file     Comment: quit in 1980s  . Alcohol Use: No  . Drug Use: No  . Sexually Active: Not on file  Other Topics Concern  . Not on file   Social History Narrative  . No narrative on file    Family History  Problem Relation Age of Onset  . Stroke    . Anesthesia problems Neg Hx   . Hypotension Neg Hx   . Malignant hyperthermia Neg Hx   . Pseudochol deficiency Neg Hx     Physical Exam: Filed Vitals:   01/25/13 1052  BP: 123/72  Pulse: 99  Height: 5\' 3"  (1.6 m)  Weight: 126 lb 12.8 oz (57.516 kg)    GEN- The patient is well appearing, alert and oriented x 3 today.   Head- normocephalic, atraumatic Eyes-  Sclera clear, conjunctiva pink Ears- hearing intact Oropharynx- clear Neck- supple, no JVP Lymph- no cervical lymphadenopathy Lungs- Clear to ausculation bilaterally, normal work of breathing Heart- Regular rate and rhythm, no murmurs, rubs or gallops, PMI not laterally displaced GI- soft, NT, ND, + BS Extremities- no  clubbing, cyanosis, or edema  ekg 6/11 reveals coarse afib, V rate 81 bpm  Assessment and Plan:  1. afib- recurrent with decreased amiodarone though symptoms are minimal Increase amiodarone back to 200mg  daily Return in 3 weeks for an ekg.  If still in afib, then she will need cardioversion at that time.  2. Bradycardia Well controlled  3. HTN Stable No change required today  Return in 2 months to see me.

## 2013-01-25 NOTE — Patient Instructions (Signed)
Your physician recommends that you schedule a follow-up appointment in: 3 weeks EKG in nurse room if in afib will need DCCV , see Dr Johney Frame in 2 months    Your physician has recommended you make the following change in your medication:  1) increase Amiodarone to 200mg  daily

## 2013-02-03 ENCOUNTER — Telehealth: Payer: Self-pay | Admitting: Internal Medicine

## 2013-02-03 NOTE — Telephone Encounter (Deleted)
New Problem:     Patient called in wanting to speak with you about a medication.  Please call back.

## 2013-02-15 ENCOUNTER — Other Ambulatory Visit: Payer: Medicare PPO

## 2013-02-15 ENCOUNTER — Ambulatory Visit (INDEPENDENT_AMBULATORY_CARE_PROVIDER_SITE_OTHER): Payer: Medicare PPO | Admitting: Internal Medicine

## 2013-02-15 VITALS — BP 124/84 | HR 84 | Ht 63.0 in | Wt 127.5 lb

## 2013-02-15 DIAGNOSIS — Z5181 Encounter for therapeutic drug level monitoring: Secondary | ICD-10-CM

## 2013-02-15 DIAGNOSIS — Z01810 Encounter for preprocedural cardiovascular examination: Secondary | ICD-10-CM

## 2013-02-15 DIAGNOSIS — I4891 Unspecified atrial fibrillation: Secondary | ICD-10-CM

## 2013-02-15 LAB — BASIC METABOLIC PANEL
BUN: 18 mg/dL (ref 6–23)
CO2: 25 mEq/L (ref 19–32)
Calcium: 9.3 mg/dL (ref 8.4–10.5)
Chloride: 105 mEq/L (ref 96–112)
Creatinine, Ser: 1 mg/dL (ref 0.4–1.2)
Glucose, Bld: 86 mg/dL (ref 70–99)

## 2013-02-15 LAB — CBC WITH DIFFERENTIAL/PLATELET
Basophils Absolute: 0 10*3/uL (ref 0.0–0.1)
Basophils Relative: 0.2 % (ref 0.0–3.0)
Eosinophils Absolute: 0.1 10*3/uL (ref 0.0–0.7)
Lymphocytes Relative: 22 % (ref 12.0–46.0)
MCHC: 33.5 g/dL (ref 30.0–36.0)
MCV: 94.8 fl (ref 78.0–100.0)
Monocytes Absolute: 0.8 10*3/uL (ref 0.1–1.0)
Neutrophils Relative %: 68 % (ref 43.0–77.0)
RDW: 13.6 % (ref 11.5–14.6)

## 2013-02-15 LAB — MAGNESIUM: Magnesium: 2.1 mg/dL (ref 1.5–2.5)

## 2013-02-15 NOTE — Patient Instructions (Signed)
Pt to call the Triage Nurse this  afternoon when she can come  for Cardioversion if Wednesday or Friday.

## 2013-02-15 NOTE — Progress Notes (Signed)
Pt in for an EKG. On 01/25/13 pt was to increase Amiodarone to 200 mg daily to see if she convert from A-fib to Sinus rhythm. EKG done pt is in A-fibrillation rate of 84 beats/minute per Dr Johney Frame MD. BP right arm 124/84 left arm 134/88. Pt is feeling fine denies any symptoms. Spoke with Dennis Bast RN, Dr. Jenel Lucks nurse regarding pt's EKG; pt is to be scheduled for Cardioversion. BMET,CBC w/diff and mg+ level done today. Pt will call back this afternoon to let us know when she can get someone to bring her to the hospital  for the procedure.

## 2013-02-16 ENCOUNTER — Encounter (HOSPITAL_COMMUNITY): Payer: Self-pay | Admitting: *Deleted

## 2013-02-16 ENCOUNTER — Other Ambulatory Visit: Payer: Self-pay | Admitting: *Deleted

## 2013-02-17 ENCOUNTER — Encounter (HOSPITAL_COMMUNITY): Payer: Self-pay

## 2013-02-17 ENCOUNTER — Encounter (HOSPITAL_COMMUNITY)
Admission: RE | Disposition: A | Discharge status: Home / Self Care | Payer: Self-pay | Source: Ambulatory Visit | Attending: Cardiology

## 2013-02-17 ENCOUNTER — Ambulatory Visit (HOSPITAL_COMMUNITY)
Admission: RE | Admit: 2013-02-17 | Discharge: 2013-02-17 | Disposition: A | Discharge status: Home / Self Care | Payer: Medicare PPO | Source: Ambulatory Visit | Attending: Cardiology | Admitting: Cardiology

## 2013-02-17 DIAGNOSIS — I4891 Unspecified atrial fibrillation: Secondary | ICD-10-CM | POA: Insufficient documentation

## 2013-02-17 DIAGNOSIS — Z538 Procedure and treatment not carried out for other reasons: Secondary | ICD-10-CM | POA: Insufficient documentation

## 2013-02-17 SURGERY — CANCELLED PROCEDURE

## 2013-03-01 ENCOUNTER — Ambulatory Visit (INDEPENDENT_AMBULATORY_CARE_PROVIDER_SITE_OTHER): Payer: Medicare PPO | Admitting: Nurse Practitioner

## 2013-03-01 ENCOUNTER — Encounter: Payer: Self-pay | Admitting: Nurse Practitioner

## 2013-03-01 VITALS — BP 109/67 | HR 47 | Temp 97.7°F | Ht 62.0 in | Wt 129.0 lb

## 2013-03-01 DIAGNOSIS — Z01419 Encounter for gynecological examination (general) (routine) without abnormal findings: Secondary | ICD-10-CM

## 2013-03-01 DIAGNOSIS — G2581 Restless legs syndrome: Secondary | ICD-10-CM | POA: Insufficient documentation

## 2013-03-01 DIAGNOSIS — Z0000 Encounter for general adult medical examination without abnormal findings: Secondary | ICD-10-CM

## 2013-03-01 DIAGNOSIS — Z124 Encounter for screening for malignant neoplasm of cervix: Secondary | ICD-10-CM

## 2013-03-01 LAB — POCT URINALYSIS DIPSTICK
Bilirubin, UA: NEGATIVE
Ketones, UA: NEGATIVE
Protein, UA: NEGATIVE
Spec Grav, UA: 1.01
pH, UA: 7

## 2013-03-01 LAB — POCT UA - MICROSCOPIC ONLY
Bacteria, U Microscopic: NEGATIVE
Casts, Ur, LPF, POC: NEGATIVE
Mucus, UA: NEGATIVE
Yeast, UA: NEGATIVE

## 2013-03-01 NOTE — Patient Instructions (Signed)

## 2013-03-01 NOTE — Progress Notes (Addendum)
Subjective:    Patient ID: [name], female    DOB: 1930/10/17, 79 y.o.   MRN: [id number]  Patient regularly sees Dr. Christell Constant and is here today for PAP only.  Hyperlipidemia This is a chronic problem. The current episode started more than 1 year ago. The problem is controlled. Recent lipid tests were reviewed and are normal. She has no history of diabetes. Pertinent negatives include no leg pain, myalgias or shortness of breath. Current antihyperlipidemic treatment includes statins. The current treatment provides moderate improvement of lipids. Risk factors for coronary artery disease include dyslipidemia and post-menopausal.   PT c/o left leg restlessness. Pt states it has been occuring for the last several years. Had been taking Magnesium which seemed to help. Now it seems to be getting worse. No other complaints. Patient says she really doesn't want to take anything for her RLS. Patient c/o frequent dizziness- mainly positional   Review of Systems  Respiratory: Negative for shortness of breath.   Musculoskeletal: Negative for myalgias.  All other systems reviewed and are negative.       Objective:   Physical Exam  Constitutional: She is oriented to person, place, and time. She appears well-developed and well-nourished.  HENT:  Head: Normocephalic.  Right Ear: Hearing, tympanic membrane, external ear and ear canal normal.  Left Ear: Hearing, tympanic membrane, external ear and ear canal normal.  Nose: Nose normal.  Mouth/Throat: Uvula is midline and oropharynx is clear and moist.  Eyes: Conjunctivae and EOM are normal. Pupils are equal, round, and reactive to light.  Neck: Normal range of motion and full passive range of motion without pain. Neck supple. No JVD present. Carotid bruit is not present. No mass and no thyromegaly present.  Cardiovascular: Normal rate, regular rhythm, normal heart sounds and intact distal pulses.   No murmur heard. Pulmonary/Chest: Effort  normal and breath sounds normal. Right breast exhibits no inverted nipple, no mass, no nipple discharge, no skin change and no tenderness. Left breast exhibits no inverted nipple, no mass, no nipple discharge, no skin change and no tenderness.  Abdominal: Soft. Bowel sounds are normal. She exhibits no distension and no mass. There is no tenderness.  Genitourinary: Vagina normal and uterus normal. No breast swelling, tenderness, discharge or bleeding.  bimanual exam-No adnexal masses or tenderness Cervix nonparous and pink  Musculoskeletal: Normal range of motion. She exhibits no edema and no tenderness.  Lymphadenopathy:    She has no cervical adenopathy.  Neurological: She is alert and oriented to person, place, and time.  Skin: Skin is warm and dry.  Psychiatric: She has a normal mood and affect. Her behavior is normal. Judgment and thought content normal.   BP 109/67  Pulse 47  Temp(Src) 97.7 F (36.5 C) (Oral)  Ht 5\' 2"  (1.575 m)  Wt 129 lb (58.514 kg)  BMI 23.59 kg/m2 Results for orders placed in visit on 03/01/13  POCT URINALYSIS DIPSTICK      Result Value Range   Color, UA yellow     Clarity, UA cloudy     Glucose, UA neg     Bilirubin, UA neg     Ketones, UA neg     Spec Grav, UA 1.010     Blood, UA trace     pH, UA 7.0     Protein, UA neg     Urobilinogen, UA negative     Nitrite, UA neg     Leukocytes, UA Trace    POCT UA - MICROSCOPIC  ONLY      Result Value Range   WBC, Ur, HPF, POC 5-10     RBC, urine, microscopic 1-5     Bacteria, U Microscopic neg     Mucus, UA neg     Epithelial cells, urine per micros occ     Crystals, Ur, HPF, POC neg     Casts, Ur, LPF, POC neg     Yeast, UA neg            Assessment & Plan:  1. Annual physical exam  - POCT urinalysis dipstick - POCT UA - Microscopic Only  2. Restless leg syndrome Patient doesn't want to take anything at this time Keep legs really warm  3. Encounter for routine gynecological  examination Results pending - Pap IG (Image Guided)  Mary-Margaret Daphine Deutscher, FNP

## 2013-03-04 LAB — PAP IG (IMAGE GUIDED): PAP Smear Comment: 0

## 2013-03-23 ENCOUNTER — Ambulatory Visit (INDEPENDENT_AMBULATORY_CARE_PROVIDER_SITE_OTHER): Payer: Medicare PPO | Admitting: Nurse Practitioner

## 2013-03-23 ENCOUNTER — Encounter: Payer: Self-pay | Admitting: Nurse Practitioner

## 2013-03-23 VITALS — BP 156/66 | HR 47 | Temp 97.1°F | Ht 62.0 in | Wt 126.0 lb

## 2013-03-23 DIAGNOSIS — R42 Dizziness and giddiness: Secondary | ICD-10-CM

## 2013-03-23 MED ORDER — MECLIZINE HCL 25 MG PO TABS: 25.0000 mg | ORAL_TABLET | Freq: Three times a day (TID) | ORAL | Status: DC | PRN

## 2013-03-23 NOTE — Patient Instructions (Signed)
Vertigo Vertigo means you feel like you or your surroundings are moving when they are not. Vertigo can be dangerous if it occurs when you are at work, driving, or performing difficult activities.  CAUSES  Vertigo occurs when there is a conflict of signals sent to your brain from the visual and sensory systems in your body. There are many different causes of vertigo, including:  Infections, especially in the inner ear.  A bad reaction to a drug or misuse of alcohol and medicines.  Withdrawal from drugs or alcohol.  Rapidly changing positions, such as lying down or rolling over in bed.  A migraine headache.  Decreased blood flow to the brain.  Increased pressure in the brain from a head injury, infection, tumor, or bleeding. SYMPTOMS  You may feel as though the world is spinning around or you are falling to the ground. Because your balance is upset, vertigo can cause nausea and vomiting. You may have involuntary eye movements (nystagmus). DIAGNOSIS  Vertigo is usually diagnosed by physical exam. If the cause of your vertigo is unknown, your caregiver may perform imaging tests, such as an MRI scan (magnetic resonance imaging). TREATMENT  Most cases of vertigo resolve on their own, without treatment. Depending on the cause, your caregiver may prescribe certain medicines. If your vertigo is related to body position issues, your caregiver may recommend movements or procedures to correct the problem. In rare cases, if your vertigo is caused by certain inner ear problems, you may need surgery. HOME CARE INSTRUCTIONS   Follow your caregiver's instructions.  Avoid driving.  Avoid operating heavy machinery.  Avoid performing any tasks that would be dangerous to you or others during a vertigo episode.  Tell your caregiver if you notice that certain medicines seem to be causing your vertigo. Some of the medicines used to treat vertigo episodes can actually make them worse in some people. SEEK  IMMEDIATE MEDICAL CARE IF:   Your medicines do not relieve your vertigo or are making it worse.  You develop problems with talking, walking, weakness, or using your arms, hands, or legs.  You develop severe headaches.  Your nausea or vomiting continues or gets worse.  You develop visual changes.  A family member notices behavioral changes.  Your condition gets worse. MAKE SURE YOU:  Understand these instructions.  Will watch your condition.  Will get help right away if you are not doing well or get worse. Document Released: 05/01/2005 Document Revised: 10/14/2011 Document Reviewed: 02/07/2011 [employer] Patient Information 2014 [employer].  

## 2013-03-23 NOTE — Progress Notes (Signed)
  Subjective:    Patient ID: [name], female    DOB: [birth year], 79 y.o.   MRN: [id number]  HPI  Patinet in today c/o dizziness- Has been intermittent since December-Has occurred off o=and on since then- The last 2 weeks she has had episodes of dizziness mainly when she is bending over or turning her head quickly.- No nausea- episodes last about 20 seconds.    Review of Systems  All other systems reviewed and are negative.       Objective:   Physical Exam  Constitutional: She is oriented to person, place, and time. She appears well-developed and well-nourished.  Cardiovascular: Normal rate and normal heart sounds.   Pulmonary/Chest: Effort normal and breath sounds normal.  Neurological: She is alert and oriented to person, place, and time. She has normal reflexes. No cranial nerve deficit.   BP 156/66  Pulse 47  Temp(Src) 97.1 F (36.2 C) (Oral)  Ht 5\' 2"  (1.575 m)  Wt 126 lb (57.153 kg)  BMI 23.04 kg/m2        Assessment & Plan:  1. Vertigo Force fluids Rise slowly from sitting or laying - meclizine (ANTIVERT) 25 MG tablet; Take 1 tablet (25 mg total) by mouth 3 (three) times daily as needed.  Dispense: 30 tablet; Refill: 0 - Ambulatory referral to Neurology  Mary-Margaret Daphine Deutscher, FNP

## 2013-04-08 ENCOUNTER — Ambulatory Visit (INDEPENDENT_AMBULATORY_CARE_PROVIDER_SITE_OTHER): Payer: Medicare PPO | Admitting: Internal Medicine

## 2013-04-08 ENCOUNTER — Encounter: Payer: Self-pay | Admitting: Internal Medicine

## 2013-04-08 VITALS — BP 130/62 | HR 50 | Ht 62.0 in | Wt 128.0 lb

## 2013-04-08 DIAGNOSIS — I4891 Unspecified atrial fibrillation: Secondary | ICD-10-CM

## 2013-04-08 MED ORDER — RIVAROXABAN 15 MG PO TABS: 15.0000 mg | ORAL_TABLET | Freq: Every day | ORAL | Status: DC

## 2013-04-08 NOTE — Progress Notes (Signed)
PCP:  Rudi Heap, MD  The patient presents today for routine electrophysiology followup.  Since last being seen in our clinic, the patient reports doing very well.  She has maintained sinus rhythm with amiodarone.  She denies difficulty with this medicine.  Today, she denies symptoms of palpitations, chest pain, shortness of breath, orthopnea, PND, lower extremity edema, dizziness, presyncope, syncope, or neurologic sequela.  The patient feels that she is tolerating medications without difficulties and is otherwise without complaint today.   Past Medical History  Diagnosis Date  . Hyperlipidemia   . Microcytic anemia   . Osteopenia   . 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 Persistent atrial fibrillation     s/p PVI 04/2010  . Sinus bradycardia   . CVA (cerebral vascular accident) 2007  . Arthritis    Past Surgical History  Procedure Laterality Date  . Breast mass resected    . Afib ablation  04/2010    PVI with CTI ablation performed by [facility]  . Cardioversion      for afib  . Cataract extraction w/phaco  11/11/2011    Procedure: CATARACT EXTRACTION PHACO AND INTRAOCULAR LENS PLACEMENT (IOC);  Surgeon: Gemma Payor, MD;  Location: [facility];  Service: Ophthalmology;  Laterality: Left;  CDE:12.62  . Cataract extraction w/phaco  11/25/2011    Procedure: CATARACT EXTRACTION PHACO AND INTRAOCULAR LENS PLACEMENT (IOC);  Surgeon: Gemma Payor, MD;  Location: [facility];  Service: Ophthalmology;  Laterality: Right;  CDE 16.14    Current Outpatient Prescriptions  Medication Sig Dispense Refill  . amiodarone (PACERONE) 200 MG tablet Take 1 tablet (200 mg total) by mouth daily.  90 tablet  3  . atorvastatin (LIPITOR) 10 MG tablet Take 5 mg by mouth daily.       . Calcium Carbonate-Vit D-Min 1200-1000 MG-UNIT CHEW Chew 1 tablet by mouth daily.      . chlorhexidine (PERIDEX) 0.12 % solution Use as directed in the mouth or throat daily.       . Cholecalciferol (VITAMIN D) 2000 UNITS  CAPS Take 1 capsule by mouth daily.        Marland Kitchen ipratropium (ATROVENT) 0.06 % nasal spray PLACE 1 SPRAY INTO THE NOSE 2 (TWO) TIMES DAILY. AS NEEDED  15 mL  1  . Magnesium 500 MG TABS Take 1 tablet by mouth daily.      . Multiple Vitamin (MULTIVITAMIN) capsule Take 1 capsule by mouth daily.        Marland Kitchen PRADAXA 150 MG CAPS Take 1 capsule by mouth 2 (two) times daily.      . [DISCONTINUED] Calcium Carbonate (CALCIUM 500 PO) Take 1 tablet by mouth daily.         No current facility-administered medications for this visit.    No Known Allergies  History   Social History  . Marital Status: Widowed    Spouse Name: N/A    Number of Children: N/A  . Years of Education: N/A   Occupational History  . retired    Social History Main Topics  . Smoking status: Former Smoker -- 1.00 packs/day for 30 years    Types: Cigarettes  . Smokeless tobacco: Not on file     Comment: quit in 1980s  . Alcohol Use: No  . Drug Use: No  . Sexual Activity: Not on file   Other Topics Concern  . Not on file   Social History Narrative  . No narrative on file  Family History  Problem Relation Age of Onset  . Stroke    . Anesthesia problems Neg Hx   . Hypotension Neg Hx   . Malignant hyperthermia Neg Hx   . Pseudochol deficiency Neg Hx     ROS-  All systems are reviewed and are negative except as outlined in the HPI above  Physical Exam: Filed Vitals:   04/08/13 1516  BP: 130/62  Pulse: 50  Height: 5\' 2"  (1.575 m)  Weight: 128 lb (58.06 kg)    GEN- The patient is well appearing, alert and oriented x 3 today.   Head- normocephalic, atraumatic Eyes-  Sclera clear, conjunctiva pink Ears- hearing intact Oropharynx- clear Neck- supple, no JVP Lymph- no cervical lymphadenopathy Lungs- Clear to ausculation bilaterally, normal work of breathing Heart- Regular rate and rhythm, no murmurs, rubs or gallops, PMI not laterally displaced GI- soft, NT, ND, + BS Extremities- no clubbing, cyanosis, or  edema MS- no significant deformity or atrophy Skin- no rash or lesion Psych- euthymic mood, full affect Neuro- strength and sensation are intact  ekg today reveals sinus rhythm 50 bpm, otherwise normal ekg  Assessment and Plan:  1. afib Maintaining sinus rhythm She would like to switch from pradaxa to xarelto. Her CrCl is <50.  We will therefore stop pradaxa and start xarelto 15mg  daily (once she has depleted her current supply of pradaxa)  Return in 3 months She will need LFTs/TFTs at that time.

## 2013-04-08 NOTE — Patient Instructions (Addendum)
Your physician recommends that you schedule a follow-up appointment in: 3 months with Dr Johney Frame  Your physician has recommended you make the following change in your medication:  1) Stop Pradaxa 2) Start Xarelto 15mg  daily

## 2013-04-10 ENCOUNTER — Other Ambulatory Visit: Payer: Self-pay | Admitting: Family Medicine

## 2013-04-14 ENCOUNTER — Ambulatory Visit (INDEPENDENT_AMBULATORY_CARE_PROVIDER_SITE_OTHER): Payer: Medicare PPO | Admitting: Neurology

## 2013-04-14 ENCOUNTER — Encounter: Payer: Self-pay | Admitting: Neurology

## 2013-04-14 VITALS — BP 133/68 | HR 48 | Ht 60.0 in | Wt 126.0 lb

## 2013-04-14 DIAGNOSIS — D508 Other iron deficiency anemias: Secondary | ICD-10-CM

## 2013-04-14 DIAGNOSIS — R42 Dizziness and giddiness: Secondary | ICD-10-CM

## 2013-04-14 DIAGNOSIS — I4891 Unspecified atrial fibrillation: Secondary | ICD-10-CM

## 2013-04-14 NOTE — Progress Notes (Signed)
[facility]  PATIENT: [name] DOB: 12-02-1930  HISTORICAL [name] is 79 years old right-handed Caucasian female, accompanied by her cousin for evaluation of acute onset of vertigo  She had a past medical history of paroxysmal atrial fibrillation, previous catheterized ablation in 2012, without success, her cardiologist was Dr. Johney Frame, last clinical visit was in September 4th, 2014, she was taking pradaxa, maintained in sinus rhythm, the decision was to start Xarelto,   She is still highly functional, walking without any difficulty, still driving,   in December 2013, she got up early morning using the bathroom, developed intense vertigo, fell to the ground,   with right frontal area ablation, but she was able to get up, finished using the bathroom, finished breakfast and feeding her dog, then was taken by her relative to the hospital for stitches at her right frontal area  She was doing very well, until July 2014, she began to experience recurrent episode of transient spinning sensation, it happens occasionally when she lying down to the left or right at night time, bending over, or turning her head especially to the right side  But in between episode, she denies gait difficulty, and she has mild baseline hearing loss, no significant tinnitus.  REVIEW OF SYSTEMS: Full 14 system review of systems performed and notable only for  fatigue, hearing loss, easy bruising, easy bleeding, dizziness, restless leg  ALLERGIES: No Known Allergies  HOME MEDICATIONS: Outpatient Prescriptions Prior to Visit  Medication Sig Dispense Refill  . amiodarone (PACERONE) 200 MG tablet Take 1 tablet (200 mg total) by mouth daily.  90 tablet  3  . atorvastatin (LIPITOR) 10 MG tablet TAKE ONE TABLET BY MOUTH ONE TIME DAILY  30 tablet  2  . Calcium Carbonate-Vit D-Min 1200-1000 MG-UNIT CHEW Chew 1 tablet by mouth daily.      . chlorhexidine (PERIDEX) 0.12 % solution Use as  directed in the mouth or throat daily.       . Cholecalciferol (VITAMIN D) 2000 UNITS CAPS Take 1 capsule by mouth daily.        Marland Kitchen ipratropium (ATROVENT) 0.06 % nasal spray PLACE 1 SPRAY INTO THE NOSE 2 (TWO) TIMES DAILY. AS NEEDED  15 mL  1  . Magnesium 500 MG TABS Take 1 tablet by mouth daily.      . Multiple Vitamin (MULTIVITAMIN) capsule Take 1 capsule by mouth daily.        . Rivaroxaban (XARELTO) 15 MG TABS tablet Take 1 tablet (15 mg total) by mouth daily.  30 tablet  11     PAST MEDICAL HISTORY: Past Medical History  Diagnosis Date  . Hyperlipidemia   . Microcytic anemia   . Osteopenia   . 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 Persistent atrial fibrillation     s/p PVI 04/2010  . Sinus bradycardia   . CVA (cerebral vascular accident) 2007  . Arthritis   . Dizziness     PAST SURGICAL HISTORY: Past Surgical History  Procedure Laterality Date  . Breast mass resected    . Afib ablation  04/2010    PVI with CTI ablation performed by [facility]  . Cardioversion      for afib  . Cataract extraction w/phaco  11/11/2011    Procedure: CATARACT EXTRACTION PHACO AND INTRAOCULAR LENS PLACEMENT (IOC);  Surgeon: Gemma Payor, MD;  Location: [facility];  Service: Ophthalmology;  Laterality: Left;  CDE:12.62  . Cataract extraction w/phaco  11/25/2011    Procedure: CATARACT EXTRACTION PHACO AND INTRAOCULAR LENS PLACEMENT (IOC);  Surgeon: Gemma Payor, MD;  Location: [facility];  Service: Ophthalmology;  Laterality: Right;  CDE 16.14    FAMILY HISTORY: Family History  Problem Relation Age of Onset  . Stroke Mother   . Anesthesia problems Neg Hx   . Hypotension Neg Hx   . Malignant hyperthermia Neg Hx   . Pseudochol deficiency Neg Hx   . Lung cancer Father    SOCIAL HISTORY:  History   Social History  . Marital Status: Widowed    Spouse Name: N/A    Number of Children: 0  . Years of Education: college   Occupational History  . retired    Social History Main Topics  .  Smoking status: Former Smoker -- 1.00 packs/day for 30 years    Types: Cigarettes  . Smokeless tobacco: Never Used     Comment: quit in 1980s  . Alcohol Use: No  . Drug Use: No  . Sexual Activity: Not on file   Other Topics Concern  . Not on file   Social History Narrative   Patient is retired and lives at home alone. Patient has business college education.    Right handed.   Caffeine- some times - soda    PHYSICAL EXAM    Filed Vitals:   04/14/13 1015  BP: 133/68  Pulse: 48  Height: 5' (1.524 m)  Weight: 126 lb (57.153 kg)   Body mass index is 24.61 kg/(m^2).   Generalized: In no acute distress  Neck: Supple, no carotid bruits   Cardiac: Regular rate rhythm  Pulmonary: Clear to auscultation bilaterally  Musculoskeletal: No deformity  Neurological examination  Mentation: Alert oriented to time, place, history taking, and causual conversation  Cranial nerve II-XII: Pupils were equal round reactive to light extraocular movements were full, visual field were full on confrontational test. facial sensation and strength were normal. hearing was intact to finger rubbing bilaterally. Uvula tongue midline.  head turning and shoulder shrug and were normal and symmetric.Tongue protrusion into cheek strength was normal.  Motor: normal tone, bulk and strength.  Sensory: Intact to fine touch, pinprick, preserved vibratory sensation, and proprioception at toes.  Coordination: Normal finger to nose, heel-to-shin bilaterally there was no truncal ataxia  Gait: Rising up from seated position without assistance, normal stance, without trunk ataxia, moderate stride, good arm swing, smooth turning, able to perform tiptoe, and heel walking without difficulty.   Romberg signs: Negative  Deep tendon reflexes: Brachioradialis 2/2, biceps 2/2, triceps 2/2, patellar 2/2, Achilles 2/2, plantar responses were flexor bilaterally.  Epiley's manauver did not induce any positional related  vertigo or nystagmus.  DIAGNOSTIC DATA (LABS, IMAGING, TESTING) - I reviewed patient records, labs, notes, testing and imaging myself where available.  Lab Results  Component Value Date   WBC 8.9 02/15/2013   HGB 12.2 02/15/2013   HCT 36.4 02/15/2013   MCV 94.8 02/15/2013   PLT 223.0 02/15/2013      Component Value Date/Time   NA 137 02/15/2013 1142   K 4.4 02/15/2013 1142   CL 105 02/15/2013 1142   CO2 25 02/15/2013 1142   GLUCOSE 86 02/15/2013 1142   BUN 18 02/15/2013 1142   CREATININE 1.0 02/15/2013 1142   CREATININE 0.97 01/13/2013 1117   CALCIUM 9.3 02/15/2013 1142   PROT 7.0 01/13/2013 1117   ALBUMIN 4.1 01/13/2013 1117   AST 22 01/13/2013 1117   ALT 11 01/13/2013 1117   ALKPHOS  67 01/13/2013 1117   BILITOT 0.6 01/13/2013 1117   GFRNONAA 59* 08/01/2012 1330   GFRAA 69* 08/01/2012 1330   Lab Results  Component Value Date   LDLCALC 68 01/13/2013   TRIG 62 01/13/2013    Lab Results  Component Value Date   TSH 2.66 09/24/2012   ASSESSMENT AND PLAN   79 years old right-handed Caucasian female, with vascular risk factor of paroxysmal age of fibrillation, presenting with episodes of vertigo, there was right beating horizontal nystagmus,  1 suggestive of benign positional vertigo 2 But with her vascular risk factor of age, paroxysmal atrial fibrillation, proceed with MRI of the brain to rule out brain stem/cerebellum pathology.  .     3. RTC in 2- 3 months  Levert Feinstein, M.D. Ph.D.  [facility] [address] [place], [place] [address] [phone]

## 2013-04-14 NOTE — Patient Instructions (Signed)

## 2013-05-01 ENCOUNTER — Other Ambulatory Visit: Payer: Self-pay | Admitting: Internal Medicine

## 2013-05-04 ENCOUNTER — Other Ambulatory Visit: Payer: Self-pay

## 2013-05-04 DIAGNOSIS — Z1231 Encounter for screening mammogram for malignant neoplasm of breast: Secondary | ICD-10-CM

## 2013-05-13 ENCOUNTER — Ambulatory Visit
Admission: RE | Admit: 2013-05-13 | Discharge: 2013-05-13 | Disposition: A | Discharge status: Home / Self Care | Payer: Medicare PPO | Source: Ambulatory Visit | Attending: Neurology | Admitting: Neurology

## 2013-05-13 DIAGNOSIS — D508 Other iron deficiency anemias: Secondary | ICD-10-CM

## 2013-05-13 DIAGNOSIS — I4891 Unspecified atrial fibrillation: Secondary | ICD-10-CM

## 2013-05-13 DIAGNOSIS — R269 Unspecified abnormalities of gait and mobility: Secondary | ICD-10-CM

## 2013-05-13 DIAGNOSIS — R42 Dizziness and giddiness: Secondary | ICD-10-CM

## 2013-05-17 NOTE — Progress Notes (Signed)
Quick Note:  Please call patient, MRI scan of the brain showing remote age left insular  cortex and corona radiata infarct as well as mild changes of chronic  microvascular ischemia and generalized cerebral atrophy. ______

## 2013-05-26 ENCOUNTER — Telehealth: Payer: Self-pay | Admitting: *Deleted

## 2013-05-26 NOTE — Progress Notes (Signed)
Quick Note:  Left message for patient on home number with MRI brain results, per Dr. Terrace Arabia. Told to call with any questions. ______

## 2013-05-27 NOTE — Telephone Encounter (Signed)
Left message on home number with MRI brain results, per Dr. Terrace Arabia.

## 2013-05-31 ENCOUNTER — Ambulatory Visit
Admission: RE | Admit: 2013-05-31 | Discharge: 2013-05-31 | Disposition: A | Discharge status: Home / Self Care | Payer: Medicare PPO | Source: Ambulatory Visit

## 2013-05-31 DIAGNOSIS — Z1231 Encounter for screening mammogram for malignant neoplasm of breast: Secondary | ICD-10-CM

## 2013-06-14 ENCOUNTER — Ambulatory Visit (INDEPENDENT_AMBULATORY_CARE_PROVIDER_SITE_OTHER): Payer: Medicare PPO | Admitting: Neurology

## 2013-06-14 ENCOUNTER — Encounter: Payer: Self-pay | Admitting: Neurology

## 2013-06-14 ENCOUNTER — Encounter (INDEPENDENT_AMBULATORY_CARE_PROVIDER_SITE_OTHER): Payer: Self-pay

## 2013-06-14 VITALS — BP 134/70 | HR 43 | Ht 61.5 in | Wt 129.0 lb

## 2013-06-14 DIAGNOSIS — I6350 Cerebral infarction due to unspecified occlusion or stenosis of unspecified cerebral artery: Secondary | ICD-10-CM

## 2013-06-14 DIAGNOSIS — D508 Other iron deficiency anemias: Secondary | ICD-10-CM

## 2013-06-14 DIAGNOSIS — E785 Hyperlipidemia, unspecified: Secondary | ICD-10-CM

## 2013-06-14 DIAGNOSIS — G2581 Restless legs syndrome: Secondary | ICD-10-CM

## 2013-06-14 DIAGNOSIS — I4891 Unspecified atrial fibrillation: Secondary | ICD-10-CM

## 2013-06-14 NOTE — Progress Notes (Signed)
[facility]  PATIENT: [name] DOB: 1930/09/16  HISTORICAL [name] is 79 years old right-handed Caucasian female, accompanied by her cousin for evaluation of acute onset of vertigo  She had a past medical history of paroxysmal atrial fibrillation, previous catheterized ablation in 2012, without success, her cardiologist was Dr. Johney Frame, last clinical visit was in September 4th, 2014, she was taking pradaxa, maintained in sinus rhythm, the decision was to start Xarelto,   She is still highly functional, walking without any difficulty, still driving,   in December 2013, she got up early morning using the bathroom, developed intense vertigo, fell to the ground,   with right frontal area ablation, but she was able to get up, finished using the bathroom, finished breakfast and feeding her dog, then was taken by her relative to the hospital for stitches at her right frontal area  She was doing very well, until July 2014, she began to experience recurrent episode of transient spinning sensation, it happens occasionally when she lying down to the left or right at night time, bending over, or turning her head especially to the right side  But in between episode, she denies gait difficulty, and she has mild baseline hearing loss, no significant tinnitus.  UPDATE 06/14/2013:  She is with her cousin today, she had practiced repositional manauver, reported 90% improvement of her vertigo, but with sudden positional changes, she still has mild dizziness, unsteady gait, MRI of the brain showed remote age left insular cortex and corona radiata infarct as well as mild changes of chronic microvascular ischemia and generalized cerebral atrophy. Compared to MRI scan dated 11/09/2005 expected evolutionary changes are noted .  REVIEW OF SYSTEMS: Full 14 system review of systems performed and notable only for  fatigue, hearing loss, easy bruising, easy bleeding, dizziness,  restless leg  ALLERGIES: No Known Allergies  HOME MEDICATIONS: Outpatient Prescriptions Prior to Visit  Medication Sig Dispense Refill  . amiodarone (PACERONE) 200 MG tablet Take 1 tablet (200 mg total) by mouth daily.  90 tablet  3  . atorvastatin (LIPITOR) 10 MG tablet TAKE ONE TABLET BY MOUTH ONE TIME DAILY  30 tablet  2  . Calcium Carbonate-Vit D-Min 1200-1000 MG-UNIT CHEW Chew 1 tablet by mouth daily.      . chlorhexidine (PERIDEX) 0.12 % solution Use as directed in the mouth or throat daily.       . Cholecalciferol (VITAMIN D) 2000 UNITS CAPS Take 1 capsule by mouth daily.        Marland Kitchen ipratropium (ATROVENT) 0.06 % nasal spray PLACE 1 SPRAY INTO THE NOSE 2 (TWO) TIMES DAILY. AS NEEDED  15 mL  1  . Magnesium 500 MG TABS Take 1 tablet by mouth daily.      . Multiple Vitamin (MULTIVITAMIN) capsule Take 1 capsule by mouth daily.        . Rivaroxaban (XARELTO) 15 MG TABS tablet Take 1 tablet (15 mg total) by mouth daily.  30 tablet  11     PAST MEDICAL HISTORY: Past Medical History  Diagnosis Date  . Hyperlipidemia   . Microcytic anemia   . Osteopenia   . 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 Persistent atrial fibrillation     s/p PVI 04/2010  . Sinus bradycardia   . CVA (cerebral vascular accident) 2007  . Arthritis   . Dizziness     PAST SURGICAL HISTORY: Past Surgical History  Procedure Laterality Date  .  Breast mass resected    . Afib ablation  04/2010    PVI with CTI ablation performed by [facility]  . Cardioversion      for afib  . Cataract extraction w/phaco  11/11/2011    Procedure: CATARACT EXTRACTION PHACO AND INTRAOCULAR LENS PLACEMENT (IOC);  Surgeon: Gemma Payor, MD;  Location: [facility];  Service: Ophthalmology;  Laterality: Left;  CDE:12.62  . Cataract extraction w/phaco  11/25/2011    Procedure: CATARACT EXTRACTION PHACO AND INTRAOCULAR LENS PLACEMENT (IOC);  Surgeon: Gemma Payor, MD;  Location: [facility];  Service: Ophthalmology;  Laterality: Right;  CDE  16.14    FAMILY HISTORY: Family History  Problem Relation Age of Onset  . Stroke Mother   . Anesthesia problems Neg Hx   . Hypotension Neg Hx   . Malignant hyperthermia Neg Hx   . Pseudochol deficiency Neg Hx   . Lung cancer Father    SOCIAL HISTORY:  History   Social History  . Marital Status: Widowed    Spouse Name: N/A    Number of Children: 0  . Years of Education: college   Occupational History  . retired    Social History Main Topics  . Smoking status: Former Smoker -- 1.00 packs/day for 30 years    Types: Cigarettes  . Smokeless tobacco: Never Used     Comment: quit in 1980s  . Alcohol Use: No  . Drug Use: No  . Sexual Activity: Not on file   Other Topics Concern  . Not on file   Social History Narrative   Patient is retired and lives at home alone. Patient has business college education.    Right handed.   Caffeine- some times - soda    PHYSICAL EXAM    Filed Vitals:   06/14/13 1053  BP: 134/70  Pulse: 43  Height: 5' 1.5" (1.562 m)  Weight: 129 lb (58.514 kg)   Body mass index is 23.98 kg/(m^2).   Generalized: In no acute distress  Neck: Supple, no carotid bruits   Cardiac: Regular rate rhythm  Pulmonary: Clear to auscultation bilaterally  Musculoskeletal: No deformity  Neurological examination  Mentation: Alert oriented to time, place, history taking, and causual conversation  Cranial nerve II-XII: Pupils were equal round reactive to light extraocular movements were full, visual field were full on confrontational test. facial sensation and strength were normal. hearing was intact to finger rubbing bilaterally. Uvula tongue midline.  head turning and shoulder shrug and were normal and symmetric.Tongue protrusion into cheek strength was normal.  Motor: normal tone, bulk and strength.  Sensory: Intact to fine touch, pinprick, preserved vibratory sensation, and proprioception at toes.  Coordination: Normal finger to nose, heel-to-shin  bilaterally there was no truncal ataxia  Gait: Rising up from seated position without assistance, normal stance, without trunk ataxia, moderate stride, good arm swing, smooth turning, able to perform tiptoe, and heel walking without difficulty.   Romberg signs: Negative  Deep tendon reflexes: Brachioradialis 2/2, biceps 2/2, triceps 2/2, patellar 2/2, Achilles 2/2, plantar responses were flexor bilaterally.  Epiley's manauver did not induce any positional related vertigo or nystagmus.  DIAGNOSTIC DATA (LABS, IMAGING, TESTING) - I reviewed patient records, labs, notes, testing and imaging myself where available.  Lab Results  Component Value Date   WBC 8.9 02/15/2013   HGB 12.2 02/15/2013   HCT 36.4 02/15/2013   MCV 94.8 02/15/2013   PLT 223.0 02/15/2013      Component Value Date/Time   NA 137 02/15/2013 1142  K 4.4 02/15/2013 1142   CL 105 02/15/2013 1142   CO2 25 02/15/2013 1142   GLUCOSE 86 02/15/2013 1142   BUN 18 02/15/2013 1142   CREATININE 1.0 02/15/2013 1142   CREATININE 0.97 01/13/2013 1117   CALCIUM 9.3 02/15/2013 1142   PROT 7.0 01/13/2013 1117   ALBUMIN 4.1 01/13/2013 1117   AST 22 01/13/2013 1117   ALT 11 01/13/2013 1117   ALKPHOS 67 01/13/2013 1117   BILITOT 0.6 01/13/2013 1117   GFRNONAA 59* 08/01/2012 1330   GFRAA 69* 08/01/2012 1330   Lab Results  Component Value Date   LDLCALC 68 01/13/2013   TRIG 62 01/13/2013    Lab Results  Component Value Date   TSH 2.66 09/24/2012   ASSESSMENT AND PLAN   79 years old right-handed Caucasian female, with vascular risk factor of paroxysmal age of fibrillation, presenting with episodes of vertigo, there was right beating horizontal nystagmus,  1 most consistent with benign positional vertigo, she had a significant improvement after repositional maneuver 2.   Continue moderate exercise, return to clinic as needed   Levert Feinstein, M.D. Ph.D.  [facility] [address] [place], [place] [address] [phone]

## 2013-06-21 ENCOUNTER — Ambulatory Visit (INDEPENDENT_AMBULATORY_CARE_PROVIDER_SITE_OTHER): Payer: Medicare PPO | Admitting: Family Medicine

## 2013-06-21 ENCOUNTER — Encounter: Payer: Self-pay | Admitting: Family Medicine

## 2013-06-21 VITALS — BP 149/67 | HR 55 | Temp 98.6°F | Ht 62.0 in | Wt 129.0 lb

## 2013-06-21 DIAGNOSIS — I4891 Unspecified atrial fibrillation: Secondary | ICD-10-CM

## 2013-06-21 DIAGNOSIS — J069 Acute upper respiratory infection, unspecified: Secondary | ICD-10-CM

## 2013-06-21 DIAGNOSIS — Z79899 Other long term (current) drug therapy: Secondary | ICD-10-CM | POA: Insufficient documentation

## 2013-06-21 DIAGNOSIS — R350 Frequency of micturition: Secondary | ICD-10-CM

## 2013-06-21 DIAGNOSIS — J310 Chronic rhinitis: Secondary | ICD-10-CM

## 2013-06-21 LAB — POCT UA - MICROSCOPIC ONLY
Bacteria, U Microscopic: NEGATIVE
Casts, Ur, LPF, POC: NEGATIVE
Crystals, Ur, HPF, POC: NEGATIVE
Yeast, UA: NEGATIVE

## 2013-06-21 LAB — POCT URINALYSIS DIPSTICK
Bilirubin, UA: NEGATIVE
Blood, UA: NEGATIVE
Ketones, UA: NEGATIVE
Leukocytes, UA: NEGATIVE
Nitrite, UA: NEGATIVE
Protein, UA: NEGATIVE
Urobilinogen, UA: NEGATIVE
pH, UA: 6

## 2013-06-21 MED ORDER — AMOXICILLIN 500 MG PO CAPS: 500.0000 mg | ORAL_CAPSULE | Freq: Three times a day (TID) | ORAL | Status: DC

## 2013-06-21 MED ORDER — FLUTICASONE PROPIONATE 50 MCG/ACT NA SUSP: NASAL | Status: DC

## 2013-06-21 NOTE — Progress Notes (Signed)
Subjective:    Patient ID: KANON COLUNGA, female    DOB: 05/30/1931, 79 y.o.   MRN: [id number]  HPI Pt here for follow up and management of chronic medical problems. Please see recent visits from the neurologist and the cardiologist. Patient is up-to-date on her chest x-ray and EKG. She had an MRI of the head by the neurologist which showed an old CVA, schemic changes and some atrophy.: Health maintenance issues it appears that she'll do a DEXA scan, and FOBT, a flu shot, Prevnar shot, and a shingles shot. She complains today of head congestion running nose and cough. She also has some urgency and frequency.   Review of Systems  Constitutional: Positive for fatigue. Negative for activity change.  HENT: Positive for congestion and rhinorrhea. Negative for ear pain and sinus pressure.   Eyes: Negative for redness and itching.  Respiratory: Positive for cough (dry) and shortness of breath (with exertion).   Cardiovascular: Negative for chest pain, palpitations and leg swelling.  Gastrointestinal: Negative for abdominal pain, diarrhea and constipation.  Endocrine: Negative for cold intolerance and heat intolerance.  Genitourinary: Positive for urgency and frequency. Negative for dysuria, vaginal bleeding and vaginal discharge.  Musculoskeletal: Negative for arthralgias, back pain and myalgias.  Skin: Negative.   Neurological: Negative for tremors, weakness, light-headedness and headaches.  Hematological: Bruises/bleeds easily (due to meds).  Psychiatric/Behavioral: Negative for sleep disturbance. The patient is nervous/anxious (slight depression).        Objective:   Physical Exam  Nursing note and vitals reviewed. Constitutional: She is oriented to person, place, and time. She appears well-developed and well-nourished. No distress.  For her age of 5  HENT:  Head: Normocephalic and atraumatic.  Right Ear: External ear normal.  Left Ear: External ear normal.  Mouth/Throat:  Oropharynx is clear and moist.  Nasal congestion and pallor bilaterally. There is a tiny brown spot left soft palate which has been evaluated by the oral surgeon and was considered to be nothing to worry about. There is bilateral ear cerumen right greater than left  Eyes: Conjunctivae and EOM are normal. Pupils are equal, round, and reactive to light. Right eye exhibits no discharge. Left eye exhibits no discharge. No scleral icterus.  Neck: Normal range of motion. Neck supple. No JVD present. No thyromegaly present.  Cardiovascular: Normal rate, normal heart sounds and intact distal pulses.  Exam reveals no gallop and no friction rub.   No murmur heard. Rhythm which is slightly irregular at 72 per minute  Pulmonary/Chest: Effort normal and breath sounds normal. No respiratory distress. She has no wheezes. She has no rales. She exhibits no tenderness.  Dry cough  Abdominal: Soft. Bowel sounds are normal. She exhibits no mass. There is no tenderness. There is no rebound and no guarding.  Musculoskeletal: Normal range of motion. She exhibits no edema and no tenderness.  Lymphadenopathy:    She has no cervical adenopathy.  Neurological: She is alert and oriented to person, place, and time. She has normal reflexes. No cranial nerve deficit.  Skin: Skin is warm and dry.  Psychiatric: She has a normal mood and affect. Her behavior is normal. Judgment and thought content normal.   BP 149/67  Pulse 55  Temp(Src) 98.6 F (37 C) (Oral)  Ht 5\' 2"  (1.575 m)  Wt 129 lb (58.514 kg)  BMI 23.59 kg/m2        Assessment & Plan:   1. High risk medication use   2. Frequency of urination  3. Afib   4. URI (upper respiratory infection)   5. Rhinitis    No orders of the defined types were placed in this encounter.   Meds ordered this encounter  Medications  . amoxicillin (AMOXIL) 500 MG capsule    Sig: Take 1 capsule (500 mg total) by mouth 3 (three) times daily.    Dispense:  30 capsule     Refill:  0  . fluticasone (FLONASE) 50 MCG/ACT nasal spray    Sig: 1-2 sprays each nostril daily    Dispense:  16 g    Refill:  6   Patient Instructions  Continue current treatment and therapeutic lifestyle changes Return FOBT Return to clinic in 10 days and get your fasting lab work done Drink plenty of fluids Take prescribed medication as directed Use a cool mist humidifier in her bedroom at nighttime    Nyra Capes MD

## 2013-06-21 NOTE — Patient Instructions (Addendum)
Continue current treatment and therapeutic lifestyle changes Return FOBT Return to clinic in 10 days and get your fasting lab work done Drink plenty of fluids Take prescribed medication as directed Use a cool mist humidifier in her bedroom at nighttime

## 2013-06-21 NOTE — Addendum Note (Signed)
Addended by: Bearl Mulberry on: 06/21/2013 02:47 PM   Modules accepted: Orders

## 2013-06-23 LAB — URINE CULTURE: Organism ID, Bacteria: NO GROWTH

## 2013-06-29 ENCOUNTER — Other Ambulatory Visit (INDEPENDENT_AMBULATORY_CARE_PROVIDER_SITE_OTHER): Payer: Medicare PPO

## 2013-06-29 DIAGNOSIS — Z1212 Encounter for screening for malignant neoplasm of rectum: Secondary | ICD-10-CM

## 2013-07-01 LAB — FECAL OCCULT BLOOD, IMMUNOCHEMICAL: Fecal Occult Bld: NEGATIVE

## 2013-07-02 ENCOUNTER — Other Ambulatory Visit (INDEPENDENT_AMBULATORY_CARE_PROVIDER_SITE_OTHER): Payer: Medicare PPO

## 2013-07-02 DIAGNOSIS — D649 Anemia, unspecified: Secondary | ICD-10-CM

## 2013-07-02 DIAGNOSIS — Z79899 Other long term (current) drug therapy: Secondary | ICD-10-CM

## 2013-07-02 DIAGNOSIS — E785 Hyperlipidemia, unspecified: Secondary | ICD-10-CM

## 2013-07-02 DIAGNOSIS — E559 Vitamin D deficiency, unspecified: Secondary | ICD-10-CM

## 2013-07-02 LAB — HEPATIC FUNCTION PANEL
ALT: 13 U/L (ref 0–35)
AST: 21 U/L (ref 0–37)
Albumin: 3.8 g/dL (ref 3.5–5.2)
Indirect Bilirubin: 0.3 mg/dL (ref 0.0–0.9)
Total Protein: 6.6 g/dL (ref 6.0–8.3)

## 2013-07-02 LAB — BASIC METABOLIC PANEL WITH GFR
BUN: 13 mg/dL (ref 6–23)
CO2: 28 mEq/L (ref 19–32)
Calcium: 9.1 mg/dL (ref 8.4–10.5)
Chloride: 105 mEq/L (ref 96–112)
Creat: 1.05 mg/dL (ref 0.50–1.10)
GFR, Est Non African American: 50 mL/min — ABNORMAL LOW
Glucose, Bld: 86 mg/dL (ref 70–99)
Potassium: 4.3 mEq/L (ref 3.5–5.3)

## 2013-07-03 LAB — CBC WITH DIFFERENTIAL
Basophils Absolute: 0 10*3/uL (ref 0.0–0.2)
Eos: 1 %
Eosinophils Absolute: 0.1 10*3/uL (ref 0.0–0.4)
HCT: 35.5 % (ref 34.0–46.6)
Lymphocytes Absolute: 1.7 10*3/uL (ref 0.7–3.1)
Lymphs: 25 %
MCH: 31.1 pg (ref 26.6–33.0)
MCHC: 33.2 g/dL (ref 31.5–35.7)
MCV: 94 fL (ref 79–97)
Monocytes Absolute: 0.8 10*3/uL (ref 0.1–0.9)
Neutrophils Absolute: 4.4 10*3/uL (ref 1.4–7.0)
RDW: 13.3 % (ref 12.3–15.4)

## 2013-07-06 LAB — NMR LIPOPROFILE WITH LIPIDS
Cholesterol, Total: 140 mg/dL (ref <200)
HDL Particle Number: 35.3 umol/L (ref >=30.5)
HDL Size: 9.5 nm (ref >=9.2)
HDL-C: 61 mg/dL (ref >=40)
LDL (calc): 65 mg/dL (ref <100)
LDL Size: 21 nm (ref >20.5)
LP-IR Score: <25 (ref <=45)
Large HDL-P: 9.2 umol/L (ref >=4.8)
Large VLDL-P: 1.3 nmol/L (ref <=2.7)
Small LDL Particle Number: 267 nmol/L (ref <=527)

## 2013-07-07 ENCOUNTER — Telehealth: Payer: Self-pay | Admitting: *Deleted

## 2013-07-07 NOTE — Telephone Encounter (Signed)
Lm to call us

## 2013-07-07 NOTE — Telephone Encounter (Signed)
Lm call back

## 2013-07-07 NOTE — Telephone Encounter (Signed)
Message copied by Baltazar Apo on Wed Jul 07, 2013 10:47 AM ------      Message from: Ernestina Penna      Created: Tue Jul 06, 2013  5:51 PM       All liver function tests are within normal limits      On advanced lipid testing all cholesterol numbers are excellent and at goal      Vitamin D level is good at 57, continue current treatment      On the BMP, the blood sugar is good. Kidney function tests are within normal limits. Electrolytes including potassium are good ------

## 2013-07-19 ENCOUNTER — Ambulatory Visit: Payer: Medicare PPO | Admitting: Internal Medicine

## 2013-07-21 ENCOUNTER — Ambulatory Visit (INDEPENDENT_AMBULATORY_CARE_PROVIDER_SITE_OTHER): Payer: Medicare PPO | Admitting: Internal Medicine

## 2013-07-21 ENCOUNTER — Encounter: Payer: Self-pay | Admitting: Internal Medicine

## 2013-07-21 VITALS — BP 148/78 | HR 43 | Ht 62.5 in | Wt 131.4 lb

## 2013-07-21 DIAGNOSIS — I495 Sick sinus syndrome: Secondary | ICD-10-CM

## 2013-07-21 DIAGNOSIS — I4891 Unspecified atrial fibrillation: Secondary | ICD-10-CM

## 2013-07-21 LAB — T4, FREE: Free T4: 0.94 ng/dL (ref 0.60–1.60)

## 2013-07-21 NOTE — Patient Instructions (Signed)
Your physician recommends that you schedule a follow-up appointment in: 3 months with Dr Johney Frame  Your physician recommends that you return for lab work today: amiodarone level/tsh/T4

## 2013-07-22 ENCOUNTER — Telehealth: Payer: Self-pay | Admitting: Family Medicine

## 2013-07-22 ENCOUNTER — Telehealth: Payer: Self-pay | Admitting: Internal Medicine

## 2013-07-22 ENCOUNTER — Other Ambulatory Visit: Payer: Self-pay | Admitting: Family Medicine

## 2013-07-22 DIAGNOSIS — E039 Hypothyroidism, unspecified: Secondary | ICD-10-CM

## 2013-07-22 MED ORDER — LEVOTHYROXINE SODIUM 25 MCG PO TABS: 25.0000 ug | ORAL_TABLET | Freq: Every day | ORAL | Status: DC

## 2013-07-22 NOTE — Telephone Encounter (Signed)
New message ° ° ° °Returning nurses call to get test results °

## 2013-07-22 NOTE — Telephone Encounter (Signed)
Spoke with patient and her TSH is high but T4 is normal.  She is to follow with Dr Christell Constant

## 2013-07-24 LAB — AMIODARONE LEVEL: Amiodarone Lvl: 1.2 ug/mL — ABNORMAL LOW (ref 1.5–2.5)

## 2013-07-27 ENCOUNTER — Other Ambulatory Visit (INDEPENDENT_AMBULATORY_CARE_PROVIDER_SITE_OTHER): Payer: Medicare PPO

## 2013-07-27 ENCOUNTER — Ambulatory Visit (INDEPENDENT_AMBULATORY_CARE_PROVIDER_SITE_OTHER): Payer: Medicare PPO

## 2013-07-27 DIAGNOSIS — Z23 Encounter for immunization: Secondary | ICD-10-CM

## 2013-07-27 DIAGNOSIS — R7989 Other specified abnormal findings of blood chemistry: Secondary | ICD-10-CM

## 2013-07-27 NOTE — Telephone Encounter (Signed)
Spoke with pt in office today

## 2013-07-27 NOTE — Telephone Encounter (Signed)
Pt was redrawn today - to make sure lab was accurate and if still elevated - she will start med

## 2013-07-28 LAB — THYROID PANEL WITH TSH
Free Thyroxine Index: 1.6 (ref 1.2–4.9)
T4, Total: 5.4 ug/dL (ref 4.5–12.0)
TSH: 6.63 u[IU]/mL — ABNORMAL HIGH (ref 0.450–4.500)

## 2013-07-30 NOTE — Progress Notes (Signed)
PCP:  Rudi Heap, MD  The patient presents today for routine electrophysiology followup.  Since last being seen in our clinic, the patient reports doing very well.  She has maintained sinus rhythm with amiodarone.  She reports increasing fatigue with bradycardia. Today, she denies symptoms of palpitations, chest pain, shortness of breath, orthopnea, PND, lower extremity edema, dizziness, presyncope, syncope, or neurologic sequela.  The patient feels that she is tolerating medications without difficulties and is otherwise without complaint today.   Past Medical History  Diagnosis Date  . Hyperlipidemia   . Microcytic anemia   . Osteopenia   . 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 Persistent atrial fibrillation     s/p PVI 04/2010  . Sinus bradycardia   . CVA (cerebral vascular accident) 2007  . Arthritis   . Dizziness    Past Surgical History  Procedure Laterality Date  . Breast mass resected    . Afib ablation  04/2010    PVI with CTI ablation performed by [facility]  . Cardioversion      for afib  . Cataract extraction w/phaco  11/11/2011    Procedure: CATARACT EXTRACTION PHACO AND INTRAOCULAR LENS PLACEMENT (IOC);  Surgeon: Gemma Payor, MD;  Location: [facility];  Service: Ophthalmology;  Laterality: Left;  CDE:12.62  . Cataract extraction w/phaco  11/25/2011    Procedure: CATARACT EXTRACTION PHACO AND INTRAOCULAR LENS PLACEMENT (IOC);  Surgeon: Gemma Payor, MD;  Location: [facility];  Service: Ophthalmology;  Laterality: Right;  CDE 16.14    Current Outpatient Prescriptions  Medication Sig Dispense Refill  . amiodarone (PACERONE) 200 MG tablet Take 1 tablet (200 mg total) by mouth daily.  90 tablet  3  . atorvastatin (LIPITOR) 10 MG tablet TAKE ONE -half TABLET BY MOUTH ONE TIME DAILY      . Calcium Carbonate-Vit D-Min 1200-1000 MG-UNIT CHEW Chew 1 tablet by mouth daily.      . chlorhexidine (PERIDEX) 0.12 % solution Use as directed in the mouth or throat daily.       .  Cholecalciferol (VITAMIN D) 2000 UNITS CAPS Take 1 capsule by mouth daily.        . COCONUT OIL PO Take by mouth daily.      Marland Kitchen ipratropium (ATROVENT) 0.06 % nasal spray PLACE 1 SPRAY INTO THE NOSE 2 (TWO) TIMES DAILY. AS NEEDED  15 mL  1  . Magnesium 500 MG TABS Take 1 tablet by mouth daily.      . Multiple Vitamin (MULTIVITAMIN) capsule Take 1 capsule by mouth daily.        . Rivaroxaban (XARELTO) 15 MG TABS tablet Take 1 tablet (15 mg total) by mouth daily.  30 tablet  11  . levothyroxine (LEVOTHROID) 25 MCG tablet Take 1 tablet (25 mcg total) by mouth daily before breakfast.  30 tablet  3  . [DISCONTINUED] Calcium Carbonate (CALCIUM 500 PO) Take 1 tablet by mouth daily.         No current facility-administered medications for this visit.    No Known Allergies  History   Social History  . Marital Status: Widowed    Spouse Name: N/A    Number of Children: 0  . Years of Education: college   Occupational History  . retired    Social History Main Topics  . Smoking status: Former Smoker -- 1.00 packs/day for 30 years    Types: Cigarettes  . Smokeless tobacco: Never Used     Comment: quit  in 81s  . Alcohol Use: No  . Drug Use: No  . Sexual Activity: Not on file   Other Topics Concern  . Not on file   Social History Narrative   Patient is retired and lives at home alone. Patient has business college education.    Right handed.   Caffeine- some times - soda     Family History  Problem Relation Age of Onset  . Stroke Mother   . Anesthesia problems Neg Hx   . Hypotension Neg Hx   . Malignant hyperthermia Neg Hx   . Pseudochol deficiency Neg Hx   . Lung cancer Father     ROS-  All systems are reviewed and are negative except as outlined in the HPI above  Physical Exam: Filed Vitals:   07/21/13 1208  BP: 148/78  Pulse: 43  Height: 5' 2.5" (1.588 m)  Weight: 131 lb 6.4 oz (59.603 kg)    GEN- The patient is well appearing, alert and oriented x 3 today.   Head-  normocephalic, atraumatic Eyes-  Sclera clear, conjunctiva pink Ears- hearing intact Oropharynx- clear Neck- supple, no JVP Lymph- no cervical lymphadenopathy Lungs- Clear to ausculation bilaterally, normal work of breathing Heart- bradycardic regular rhythm GI- soft, NT, ND, + BS Extremities- no clubbing, cyanosis, or edema MS- no significant deformity or atrophy Skin- no rash or lesion Psych- euthymic mood, full affect Neuro- strength and sensation are intact  ekg today reveals sinus rhythm 50 bpm, otherwise normal ekg  Assessment and Plan:  1. afib Maintaining sinus rhythm Continue longterm anticoagulation  2. Symptomatic sinus bradycardia The patient has symptomatic bradycardia.  I would therefore recommend pacemaker implantation at this time.  Risks, benefits, alternatives to pacemaker implantation were discussed in detail with the patient today.  At this time, she is very clear that she would like to avoid PPM.  She would prefer to continue her current medical therapy and will contact my office if her bradycardia increases in the interim.  Check lfts today.  I will also check an amiodarone level to see if there is room to reduce her dose any further.  Return in 3 months

## 2013-08-02 ENCOUNTER — Telehealth: Payer: Self-pay | Admitting: Family Medicine

## 2013-08-02 NOTE — Telephone Encounter (Signed)
Pt.notified

## 2013-09-22 ENCOUNTER — Ambulatory Visit: Payer: Medicare PPO | Admitting: Family Medicine

## 2013-10-07 ENCOUNTER — Encounter: Payer: Self-pay | Admitting: Family Medicine

## 2013-10-07 ENCOUNTER — Ambulatory Visit (INDEPENDENT_AMBULATORY_CARE_PROVIDER_SITE_OTHER): Payer: Medicare PPO | Admitting: Family Medicine

## 2013-10-07 VITALS — BP 124/64 | HR 46 | Temp 97.6°F | Ht 62.5 in | Wt 128.0 lb

## 2013-10-07 DIAGNOSIS — E039 Hypothyroidism, unspecified: Secondary | ICD-10-CM

## 2013-10-07 DIAGNOSIS — I4891 Unspecified atrial fibrillation: Secondary | ICD-10-CM

## 2013-10-07 DIAGNOSIS — J310 Chronic rhinitis: Secondary | ICD-10-CM

## 2013-10-07 DIAGNOSIS — E559 Vitamin D deficiency, unspecified: Secondary | ICD-10-CM

## 2013-10-07 DIAGNOSIS — E785 Hyperlipidemia, unspecified: Secondary | ICD-10-CM

## 2013-10-07 DIAGNOSIS — H6120 Impacted cerumen, unspecified ear: Secondary | ICD-10-CM

## 2013-10-07 DIAGNOSIS — Z23 Encounter for immunization: Secondary | ICD-10-CM

## 2013-10-07 DIAGNOSIS — H6123 Impacted cerumen, bilateral: Secondary | ICD-10-CM

## 2013-10-07 LAB — POCT CBC
Granulocyte percent: 65.8 %G (ref 37–80)
HEMATOCRIT: 38.1 % (ref 37.7–47.9)
Hemoglobin: 12.1 g/dL — AB (ref 12.2–16.2)
LYMPH, POC: 2.1 (ref 0.6–3.4)
MCH, POC: 29.7 pg (ref 27–31.2)
MCHC: 31.7 g/dL — AB (ref 31.8–35.4)
MCV: 93.8 fL (ref 80–97)
MPV: 8.4 fL (ref 0–99.8)
POC Granulocyte: 5.3 (ref 2–6.9)
POC LYMPH %: 25.8 % (ref 10–50)
Platelet Count, POC: 25.8 10*3/uL — AB (ref 142–424)
RBC: 4.1 M/uL (ref 4.04–5.48)
RDW, POC: 14.6 %
WBC: 8 10*3/uL (ref 4.6–10.2)

## 2013-10-07 NOTE — Progress Notes (Signed)
Subjective:    Patient ID: Lisa SHINGLEDECKER, female    DOB: 10-Apr-1931, 80 y.o.   MRN: [id number]  HPI Patient returns for 3 month follow up on chronic medical conditions. She is concerned that her heart rate is too low and that it is contributing to her fatigue and general feeling of malaise. Please also see the complaints that are listed below in the review of systems. She has recently been started on thyroid medication and we will also recheck that in the lab work that we are doing today. She is due to see the cardiologist the end of this month.    Review of Systems  Constitutional: Positive for activity change and fatigue.  HENT: Positive for hearing loss (muffled hearing in right ear. She has an ENT that cleans them if needed) and rhinorrhea.   Eyes: Negative.   Respiratory: Negative.   Cardiovascular: Positive for palpitations (bradycardia). Negative for chest pain and leg swelling.  Gastrointestinal: Negative.   Endocrine: Negative.  Negative for polyuria.  Genitourinary: Negative.        Nocturia  Musculoskeletal: Positive for arthralgias (pain in multiple DIP joints).  Skin: Negative.   Allergic/Immunologic: Negative.   Neurological: Positive for dizziness (diagnosed with benign vertigo ). Negative for syncope, weakness and headaches (bruises easy due to bloodthinner).  Hematological: Bruises/bleeds easily.  Psychiatric/Behavioral: Positive for sleep disturbance (nocturia).       Objective:   Physical Exam  Nursing note and vitals reviewed. Constitutional: She is oriented to person, place, and time. She appears well-developed and well-nourished. No distress.  Pleasant and cooperative small framed  HENT:  Head: Normocephalic and atraumatic.  Nose: Nose normal.  Mouth/Throat: Oropharynx is clear and moist. No oropharyngeal exudate.  Ears cerumen in both ear canals severe in the right. This was removed during the visit with an ear curette. The cerumen in the left  ear canal was too deep to remove with the curette  Eyes: Conjunctivae and EOM are normal. Pupils are equal, round, and reactive to light. Right eye exhibits no discharge. Left eye exhibits no discharge. No scleral icterus.  Neck: Normal range of motion. Neck supple. No thyromegaly present.  No carotid bruits  Cardiovascular: Normal rate, regular rhythm, normal heart sounds and intact distal pulses.  Exam reveals no gallop and no friction rub.   No murmur heard. At 60 per minute  Pulmonary/Chest: Effort normal and breath sounds normal. No respiratory distress. She has no wheezes. She has no rales.  Abdominal: Soft. Bowel sounds are normal. She exhibits no mass. There is no tenderness. There is no rebound and no guarding.  Musculoskeletal: Normal range of motion. She exhibits no edema and no tenderness.  Lymphadenopathy:    She has no cervical adenopathy.  Neurological: She is alert and oriented to person, place, and time. She has normal reflexes. No cranial nerve deficit.  Skin: Skin is warm and dry.  Psychiatric: She has a normal mood and affect. Her behavior is normal. Judgment and thought content normal.          Assessment & Plan:  1. Need for pneumococcal vaccination - Pneumococcal conjugate vaccine 13-valent  2. Hypothyroid - Thyroid Panel With TSH - BMP8+EGFR  3. Hyperlipemia - Lipid panel - Hepatic function panel  4. Vitamin D deficiency - Vit D  25 hydroxy (rtn osteoporosis monitoring)  5. Afib - POCT CBC  6. Rhinitis  7. Excessive cerumen in both ear canals  8. High-risk medication use  Patient Instructions  Continue current medications. Continue good therapeutic lifestyle changes which include good diet and exercise. Fall precautions discussed with patient. If an FOBT was given today- please return it to our front desk. If you are over 93 years old - you may need Prevnar 25 or the adult Pneumonia vaccine. You can Debrox over-the-counter and use  2-3 drops nightly in the affected ear weekly for a couple of weeks and then return to office and we'll irrigate the cerumen out of the ear Monitor pulse rates at home and take these readings with you when you go see the cardiologist                        Medicare Annual Wellness Visit  [facility] and the medical providers at [facility] strive to bring you the best medical care.  In doing so we not only want to address your current medical conditions and concerns but also to detect new conditions early and prevent illness, disease and health-related problems.    Medicare offers a yearly Wellness Visit which allows our clinical staff to assess your need for preventative services including immunizations, lifestyle education, counseling to decrease risk of preventable diseases and screening for fall risk and other medical concerns.    This visit is provided free of charge (no copay) for all Medicare recipients. The clinical pharmacists at [facility] have begun to conduct these Wellness Visits which will also include a thorough review of all your medications.    As you primary medical provider recommend that you make an appointment for your Annual Wellness Visit if you have not done so already this year.  You may set up this appointment before you leave today or you may call back [phone]) and schedule an appointment.  Please make sure when you call that you mention that you are scheduling your Annual Wellness Visit with the clinical pharmacist so that the appointment may be made for the proper length of time.    Pneumococcal Conjugate Vaccine What You Need to Know Your doctor recommends that you, or your child, get a dose of PCV13 vaccine today. WHY GET VACCINATED? Pneumococcal conjugate vaccine (called PCV13 or Prevnar 13) is recommended to protect infants and toddlers, and some older children and adults with certain health conditions, from  pneumococcal disease. Pneumococcal disease is caused by infection with Streptococcus pneumoniae bacteria. These bacteria can spread from person to person through close contact. Pneumococcal disease can lead to severe health problems, including pneumonia, blood infections, and meningitis. Meningitis is an infection of the covering of the brain. Pneumococcal meningitis is fairly rare (less than 1 case per 100,000 people each year), but it leads to other health problems, including deafness and brain damage. In children, it is fatal in about 1 case out of 10. Children younger than two are at higher risk for serious disease than older children. People with certain medical conditions, people over age 6, and cigarette smokers are also at higher risk. Before vaccine, pneumococcal infections caused many problems each year in the [place] in children younger than 5, including:  more than 700 cases of meningitis,  13,000 blood infections,  about 5 million ear infections, and  about 200 deaths. About 4,000 adults still die each year because of pneumococcal infections. Pneumococcal infections can be hard to treat because some strains are resistant to antibiotics. This makes prevention through vaccination even more important. PCV13 VACCINE There are more than 90 types of  pneumococcal bacteria. PCV13 protects against 13 of them. These 13 strains cause most severe infections in children and about half of infections in adults.  PCV13 is routinely given to children at 2, 4, 6, and 28 90 months of age. Children in this age range are at greatest risk for serious diseases caused by pneumococcal infection. PCV13 vaccine may also be recommended for some older children or adults. Your doctor can give you details. A second type of pneumococcal vaccine, called PPSV23, may also be given to some children and adults, including anyone over age 80. There is a separate Vaccine Information Statement for this  vaccine. PRECAUTIONS  Anyone who has ever had a life-threatening allergic reaction to a dose of this vaccine, to an earlier pneumococcal vaccine called PCV7 (or Prevnar), or to any vaccine containing diphtheria toxoid (for example, DTaP), should not get PCV13. Anyone with a severe allergy to any component of PCV13 should not get the vaccine. Tell your doctor if the person being vaccinated has any severe allergies. If the person scheduled for vaccination is sick, your doctor might decide to reschedule the shot on another day. Your doctor can give you more information about any of these precautions. RISKS  With any medicine, including vaccines, there is a chance of side effects. These are usually mild and go away on their own, but serious reactions are also possible. Reported problems associated with PCV13 vary by dose and age, but generally:  About half of children became drowsy after the shot, had a temporary loss of appetite, or had redness or tenderness where the shot was given.  About 1 out of 3 had swelling where the shot was given.  About 1 out of 3 had a mild fever, and about 1 in 20 had a higher fever (over 102.2 F or 39 C).  Up to about 8 out of 10 became fussy or irritable. Adults receiving the vaccine have reported redness, pain, and swelling where the shot was given. Mild fever, fatigue, headache, chills, or muscle pain have also been reported. Life-threatening allergic reactions from any vaccine are very rare. WHAT IF THERE IS A SERIOUS REACTION? What should I look for? Look for anything that concerns you, such as signs of a severe allergic reaction, very high fever, or behavior changes. Signs of a severe allergic reaction can include hives, swelling of the face and throat, difficulty breathing, a fast heartbeat, dizziness, and weakness. These would start a few minutes to a few hours after the vaccination. What should I do?  If you think it is a severe allergic reaction or  other emergency that can't wait, get the person to the nearest hospital or call 9-1-1. Otherwise, call your doctor.  Afterward, the reaction should be reported to the "Vaccine Adverse Event Reporting System" (VAERS). Your doctor might file this report, or you can do it yourself through the VAERS web site at www.vaers.SamedayNews.es, or by calling 539-539-2681. VAERS is only for reporting reactions. They do not give medical advice. THE NATIONAL VACCINE INJURY COMPENSATION PROGRAM The National Vaccine Injury Compensation Program (VICP) was created in 1986. Persons who believe they may have been injured by a vaccine can learn about the program and about filing a claim by calling 346 080 2144 or visiting the [facility] website at GoldCloset.com.ee. HOW CAN I LEARN MORE?  Ask your doctor.  Call your local or state health department.  Contact the Centers for Disease Control and Prevention (CDC):  Call (407) 507-7147 (1-800-CDC-INFO) or  Visit CDC's website at http://hunter.com/ CDC  PCV13 Vaccine VIS (Interim) (10/02/11) Document Released: 05/19/2006 Document Revised: 11/16/2012 Document Reviewed: 11/11/2012 [facility] Patient Information 2014 [place], [place].    Arrie Senate MD

## 2013-10-07 NOTE — Patient Instructions (Addendum)
Continue current medications. Continue good therapeutic lifestyle changes which include good diet and exercise. Fall precautions discussed with patient. If an FOBT was given today- please return it to our front desk. If you are over 78 years old - you may need Prevnar 43 or the adult Pneumonia vaccine. You can Debrox over-the-counter and use 2-3 drops nightly in the affected ear weekly for a couple of weeks and then return to office and we'll irrigate the cerumen out of the ear Monitor pulse rates at home and take these readings with you when you go see the cardiologist                        Medicare Annual Wellness Visit  [facility] and the medical providers at [facility] strive to bring you the best medical care.  In doing so we not only want to address your current medical conditions and concerns but also to detect new conditions early and prevent illness, disease and health-related problems.    Medicare offers a yearly Wellness Visit which allows our clinical staff to assess your need for preventative services including immunizations, lifestyle education, counseling to decrease risk of preventable diseases and screening for fall risk and other medical concerns.    This visit is provided free of charge (no copay) for all Medicare recipients. The clinical pharmacists at [facility] have begun to conduct these Wellness Visits which will also include a thorough review of all your medications.    As you primary medical provider recommend that you make an appointment for your Annual Wellness Visit if you have not done so already this year.  You may set up this appointment before you leave today or you may call back [phone] and schedule an appointment.  Please make sure when you call that you mention that you are scheduling your Annual Wellness Visit with the clinical pharmacist so that the appointment may be made for the proper length of  time.    Pneumococcal Conjugate Vaccine What You Need to Know Your doctor recommends that you, or your child, get a dose of PCV13 vaccine today. WHY GET VACCINATED? Pneumococcal conjugate vaccine (called PCV13 or Prevnar 13) is recommended to protect infants and toddlers, and some older children and adults with certain health conditions, from pneumococcal disease. Pneumococcal disease is caused by infection with Streptococcus pneumoniae bacteria. These bacteria can spread from person to person through close contact. Pneumococcal disease can lead to severe health problems, including pneumonia, blood infections, and meningitis. Meningitis is an infection of the covering of the brain. Pneumococcal meningitis is fairly rare (less than 1 case per 100,000 people each year), but it leads to other health problems, including deafness and brain damage. In children, it is fatal in about 1 case out of 10. Children younger than two are at higher risk for serious disease than older children. People with certain medical conditions, people over age 2, and cigarette smokers are also at higher risk. Before vaccine, pneumococcal infections caused many problems each year in the [place] in children younger than 5, including:  more than 700 cases of meningitis,  13,000 blood infections,  about 5 million ear infections, and  about 200 deaths. About 4,000 adults still die each year because of pneumococcal infections. Pneumococcal infections can be hard to treat because some strains are resistant to antibiotics. This makes prevention through vaccination even more important. PCV13 VACCINE There are more  than 90 types of pneumococcal bacteria. PCV13 protects against 13 of them. These 13 strains cause most severe infections in children and about half of infections in adults.  PCV13 is routinely given to children at 2, 4, 6, and 54 63 months of age. Children in this age range are at greatest risk for serious  diseases caused by pneumococcal infection. PCV13 vaccine may also be recommended for some older children or adults. Your doctor can give you details. A second type of pneumococcal vaccine, called PPSV23, may also be given to some children and adults, including anyone over age 57. There is a separate Vaccine Information Statement for this vaccine. PRECAUTIONS  Anyone who has ever had a life-threatening allergic reaction to a dose of this vaccine, to an earlier pneumococcal vaccine called PCV7 (or Prevnar), or to any vaccine containing diphtheria toxoid (for example, DTaP), should not get PCV13. Anyone with a severe allergy to any component of PCV13 should not get the vaccine. Tell your doctor if the person being vaccinated has any severe allergies. If the person scheduled for vaccination is sick, your doctor might decide to reschedule the shot on another day. Your doctor can give you more information about any of these precautions. RISKS  With any medicine, including vaccines, there is a chance of side effects. These are usually mild and go away on their own, but serious reactions are also possible. Reported problems associated with PCV13 vary by dose and age, but generally:  About half of children became drowsy after the shot, had a temporary loss of appetite, or had redness or tenderness where the shot was given.  About 1 out of 3 had swelling where the shot was given.  About 1 out of 3 had a mild fever, and about 1 in 20 had a higher fever (over 102.2 F or 39 C).  Up to about 8 out of 10 became fussy or irritable. Adults receiving the vaccine have reported redness, pain, and swelling where the shot was given. Mild fever, fatigue, headache, chills, or muscle pain have also been reported. Life-threatening allergic reactions from any vaccine are very rare. WHAT IF THERE IS A SERIOUS REACTION? What should I look for? Look for anything that concerns you, such as signs of a severe allergic  reaction, very high fever, or behavior changes. Signs of a severe allergic reaction can include hives, swelling of the face and throat, difficulty breathing, a fast heartbeat, dizziness, and weakness. These would start a few minutes to a few hours after the vaccination. What should I do?  If you think it is a severe allergic reaction or other emergency that can't wait, get the person to the nearest hospital or call 9-1-1. Otherwise, call your doctor.  Afterward, the reaction should be reported to the "Vaccine Adverse Event Reporting System" (VAERS). Your doctor might file this report, or you can do it yourself through the VAERS web site at www.vaers.SamedayNews.es, or by calling 857-496-9808. VAERS is only for reporting reactions. They do not give medical advice. THE NATIONAL VACCINE INJURY COMPENSATION PROGRAM The National Vaccine Injury Compensation Program (VICP) was created in 1986. Persons who believe they may have been injured by a vaccine can learn about the program and about filing a claim by calling 219 655 5330 or visiting the [facility] website at GoldCloset.com.ee. HOW CAN I LEARN MORE?  Ask your doctor.  Call your local or state health department.  Contact the Centers for Disease Control and Prevention (CDC):  Call 734-699-5998 (1-800-CDC-INFO) or  Visit CDC's  website at http://hunter.com/ CDC PCV13 Vaccine VIS (Interim) (10/02/11) Document Released: 05/19/2006 Document Revised: 11/16/2012 Document Reviewed: 11/11/2012 [facility] Patient Information 2014 [place].

## 2013-10-08 LAB — THYROID PANEL WITH TSH
Free Thyroxine Index: 2 (ref 1.2–4.9)
T3 UPTAKE RATIO: 29 % (ref 24–39)
T4 TOTAL: 6.8 ug/dL (ref 4.5–12.0)
TSH: 5.6 u[IU]/mL — ABNORMAL HIGH (ref 0.450–4.500)

## 2013-10-08 LAB — HEPATIC FUNCTION PANEL
ALT: 16 IU/L (ref 0–32)
AST: 26 IU/L (ref 0–40)
Albumin: 4.2 g/dL (ref 3.5–4.7)
Alkaline Phosphatase: 76 IU/L (ref 39–117)
Bilirubin, Direct: 0.1 mg/dL (ref 0.00–0.40)
Total Bilirubin: 0.2 mg/dL (ref 0.0–1.2)
Total Protein: 7 g/dL (ref 6.0–8.5)

## 2013-10-08 LAB — BMP8+EGFR
BUN / CREAT RATIO: 12 (ref 11–26)
BUN: 13 mg/dL (ref 8–27)
CHLORIDE: 100 mmol/L (ref 97–108)
CO2: 24 mmol/L (ref 18–29)
Calcium: 9.3 mg/dL (ref 8.7–10.3)
Creatinine, Ser: 1.12 mg/dL — ABNORMAL HIGH (ref 0.57–1.00)
GFR calc Af Amer: 54 mL/min/{1.73_m2} — ABNORMAL LOW (ref >59)
GFR calc non Af Amer: 47 mL/min/{1.73_m2} — ABNORMAL LOW (ref >59)
Glucose: 90 mg/dL (ref 65–99)
Potassium: 4.3 mmol/L (ref 3.5–5.2)
Sodium: 139 mmol/L (ref 134–144)

## 2013-10-08 LAB — LIPID PANEL
Chol/HDL Ratio: 1.9 ratio units (ref 0.0–4.4)
Cholesterol, Total: 152 mg/dL (ref 100–199)
HDL: 78 mg/dL (ref >39)
LDL CALC: 56 mg/dL (ref 0–99)
Triglycerides: 92 mg/dL (ref 0–149)
VLDL Cholesterol Cal: 18 mg/dL (ref 5–40)

## 2013-10-08 LAB — VITAMIN D 25 HYDROXY (VIT D DEFICIENCY, FRACTURES): Vit D, 25-Hydroxy: 41.5 ng/mL (ref 30.0–100.0)

## 2013-10-13 ENCOUNTER — Other Ambulatory Visit: Payer: Self-pay | Admitting: Family Medicine

## 2013-10-14 ENCOUNTER — Telehealth: Payer: Self-pay | Admitting: Family Medicine

## 2013-10-14 DIAGNOSIS — N289 Disorder of kidney and ureter, unspecified: Secondary | ICD-10-CM

## 2013-10-14 DIAGNOSIS — E039 Hypothyroidism, unspecified: Secondary | ICD-10-CM

## 2013-10-15 MED ORDER — LEVOTHYROXINE SODIUM 50 MCG PO TABS: 50.0000 ug | ORAL_TABLET | Freq: Every day | ORAL | Status: DC

## 2013-10-15 NOTE — Telephone Encounter (Signed)
Discussed results and recommendations with patient. New script sent in. She will return in 6 weeks for rck of TSH and BMP.

## 2013-10-15 NOTE — Telephone Encounter (Signed)
Left message to return call 

## 2013-10-26 ENCOUNTER — Other Ambulatory Visit: Payer: Self-pay | Admitting: Family Medicine

## 2013-10-27 ENCOUNTER — Ambulatory Visit (INDEPENDENT_AMBULATORY_CARE_PROVIDER_SITE_OTHER): Payer: Medicare PPO | Admitting: Internal Medicine

## 2013-10-27 ENCOUNTER — Encounter: Payer: Self-pay | Admitting: Internal Medicine

## 2013-10-27 VITALS — BP 134/78 | HR 47 | Ht 63.0 in | Wt 126.0 lb

## 2013-10-27 DIAGNOSIS — I495 Sick sinus syndrome: Secondary | ICD-10-CM

## 2013-10-27 DIAGNOSIS — I4891 Unspecified atrial fibrillation: Secondary | ICD-10-CM

## 2013-10-27 NOTE — Patient Instructions (Signed)
Your physician wants you to follow-up in:  6 months. You will receive a reminder letter in the mail two months in advance. If you don't receive a letter, please call our office to schedule the follow-up appointment.  Your physician has recommended you make the following change in your medication: Stop amiodarone.   

## 2013-10-27 NOTE — Progress Notes (Signed)
PCP:  [name], MD  The patient presents today for routine electrophysiology followup.  Since last being seen in our clinic, the patient reports doing very well.  She has maintained sinus rhythm with amiodarone. She has become increasingly concerned about risks of amiodarone and would like to stop this medicine. Today, she denies symptoms of palpitations, chest pain, shortness of breath, orthopnea, PND, lower extremity edema, dizziness, presyncope, syncope, or neurologic sequela.  The patient feels that she is tolerating medications without difficulties and is otherwise without complaint today.   Past Medical History  Diagnosis Date  . Hyperlipidemia   . Microcytic anemia   . Osteopenia   . 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 Persistent atrial fibrillation     s/p PVI 04/2010  . Sinus bradycardia   . CVA (cerebral vascular accident) 2007  . Arthritis   . Dizziness    Past Surgical History  Procedure Laterality Date  . Breast mass resected    . Afib ablation  04/2010    PVI with CTI ablation performed by [facility]  . Cardioversion      for afib  . Cataract extraction w/phaco  11/11/2011    Procedure: CATARACT EXTRACTION PHACO AND INTRAOCULAR LENS PLACEMENT (IOC);  Surgeon: Tonny Branch, MD;  Location: [facility];  Service: Ophthalmology;  Laterality: Left;  CDE:12.62  . Cataract extraction w/phaco  11/25/2011    Procedure: CATARACT EXTRACTION PHACO AND INTRAOCULAR LENS PLACEMENT (IOC);  Surgeon: Tonny Branch, MD;  Location: [facility];  Service: Ophthalmology;  Laterality: Right;  CDE 16.14    Current Outpatient Prescriptions  Medication Sig Dispense Refill  . amiodarone (PACERONE) 200 MG tablet Take 1 tablet (200 mg total) by mouth daily.  90 tablet  3  . atorvastatin (LIPITOR) 10 MG tablet TAKE 1/2 TABLET BY MOUTH ONE TIME DAILY      . Calcium Carbonate-Vit D-Min 1200-1000 MG-UNIT CHEW Chew 1 tablet by mouth daily.      . chlorhexidine (PERIDEX) 0.12 % solution Use mouth  rinse once a day      . Cholecalciferol (VITAMIN D3) 1000 UNITS CAPS Take 1 capsule by mouth daily.      . COCONUT OIL PO Take by mouth daily.      . fluticasone (FLONASE) 50 MCG/ACT nasal spray Place 1 spray into both nostrils daily.      Marland Kitchen ipratropium (ATROVENT) 0.06 % nasal spray PLACE 1 SPRAY INTO THE NOSE 2 (TWO) TIMES DAILY. AS NEEDED  15 mL  1  . levothyroxine (SYNTHROID, LEVOTHROID) 50 MCG tablet Take 1 tablet (50 mcg total) by mouth daily.  30 tablet  1  . Magnesium 500 MG TABS Take 1 tablet by mouth daily.      . Multiple Vitamin (MULTIVITAMIN) capsule Take 1 capsule by mouth daily.        . Rivaroxaban (XARELTO) 15 MG TABS tablet Take 1 tablet (15 mg total) by mouth daily.  30 tablet  11  . [DISCONTINUED] Calcium Carbonate (CALCIUM 500 PO) Take 1 tablet by mouth daily.         No current facility-administered medications for this visit.    No Known Allergies  History   Social History  . Marital Status: Widowed    Spouse Name: N/A    Number of Children: 0  . Years of Education: college   Occupational History  . retired    Social History Main Topics  . Smoking status: Former Smoker -- 1.00 packs/day  for 30 years    Types: Cigarettes  . Smokeless tobacco: Never Used     Comment: quit in 1980s  . Alcohol Use: No  . Drug Use: No  . Sexual Activity: Not on file   Other Topics Concern  . Not on file   Social History Narrative   Patient is retired and lives at home alone. Patient has business college education.    Right handed.   Caffeine- some times - soda     Family History  Problem Relation Age of Onset  . Stroke Mother   . Anesthesia problems Neg Hx   . Hypotension Neg Hx   . Malignant hyperthermia Neg Hx   . Pseudochol deficiency Neg Hx   . Lung cancer Father     ROS-  All systems are reviewed and are negative except as outlined in the HPI above  Physical Exam: Filed Vitals:   10/27/13 0916  BP: 134/78  Pulse: 47  Height: 5\' 3"  (1.6 m)  Weight:  126 lb (57.153 kg)    GEN- The patient is well appearing, alert and oriented x 3 today.   Head- normocephalic, atraumatic Eyes-  Sclera clear, conjunctiva pink Ears- hearing intact Oropharynx- clear Neck- supple, no JVP Lymph- no cervical lymphadenopathy Lungs- Clear to ausculation bilaterally, normal work of breathing Heart- bradycardic regular rhythm GI- soft, NT, ND, + BS Extremities- no clubbing, cyanosis, or edema Neuro- strength and sensation are intact  ekg today reveals sinus rhythm 47 bpm, otherwise normal ekg  Assessment and Plan:  1. afib Maintaining sinus rhythm Continue longterm anticoagulation We had a long discussion today about AAD therapy.  Per her requests, we will stop amiodarone.  If she has further afib then our options would be rate control, tikosyn, or another ablation.  Given her advanced age, I would like to avoid ablation if able.  2. Symptomatic sinus bradycardia The patient has symptomatic bradycardia.  Stop amiodarone as above  Return in 6 months.  She will contact me if further problems arise in the interim.

## 2013-12-30 ENCOUNTER — Other Ambulatory Visit (INDEPENDENT_AMBULATORY_CARE_PROVIDER_SITE_OTHER): Payer: Medicare PPO

## 2013-12-30 DIAGNOSIS — N289 Disorder of kidney and ureter, unspecified: Secondary | ICD-10-CM

## 2013-12-30 DIAGNOSIS — E039 Hypothyroidism, unspecified: Secondary | ICD-10-CM

## 2013-12-30 NOTE — Progress Notes (Signed)
Pt came in for lab  only 

## 2013-12-31 ENCOUNTER — Telehealth: Payer: Self-pay | Admitting: Family Medicine

## 2013-12-31 LAB — TSH: TSH: 2.88 u[IU]/mL (ref 0.450–4.500)

## 2013-12-31 LAB — BMP8+EGFR
BUN/Creatinine Ratio: 13 (ref 11–26)
BUN: 14 mg/dL (ref 8–27)
CHLORIDE: 104 mmol/L (ref 97–108)
CO2: 26 mmol/L (ref 18–29)
Calcium: 9.5 mg/dL (ref 8.7–10.3)
Creatinine, Ser: 1.04 mg/dL — ABNORMAL HIGH (ref 0.57–1.00)
GFR calc Af Amer: 59 mL/min/{1.73_m2} — ABNORMAL LOW (ref >59)
GFR calc non Af Amer: 51 mL/min/{1.73_m2} — ABNORMAL LOW (ref >59)
Glucose: 86 mg/dL (ref 65–99)
Potassium: 4.9 mmol/L (ref 3.5–5.2)
Sodium: 142 mmol/L (ref 134–144)

## 2013-12-31 NOTE — Telephone Encounter (Signed)
Message copied by Waverly Ferrari on Fri Dec 31, 2013  8:59 AM ------      Message from: Chipper Herb      Created: Fri Dec 31, 2013  7:50 AM       The TSH is now within normal limits. Continue current thyroid treatment      The blood sugar was good at 86. The creatinine is very slightly elevated but lower than it was 2 months ago at 1.04. The electrolytes including potassium are within normal limit----member, continue to avoid NSAIDs ------

## 2013-12-31 NOTE — Telephone Encounter (Signed)
Pt aware of lab results 

## 2014-01-01 ENCOUNTER — Encounter: Payer: Self-pay | Admitting: *Deleted

## 2014-01-10 ENCOUNTER — Ambulatory Visit: Payer: Medicare PPO | Admitting: Family Medicine

## 2014-01-12 ENCOUNTER — Encounter: Payer: Self-pay | Admitting: Family Medicine

## 2014-01-12 ENCOUNTER — Telehealth: Payer: Self-pay | Admitting: Family Medicine

## 2014-01-12 ENCOUNTER — Ambulatory Visit (INDEPENDENT_AMBULATORY_CARE_PROVIDER_SITE_OTHER): Payer: Medicare PPO | Admitting: Family Medicine

## 2014-01-12 VITALS — BP 112/59 | HR 46 | Temp 98.6°F | Ht 63.0 in | Wt 124.0 lb

## 2014-01-12 DIAGNOSIS — E079 Disorder of thyroid, unspecified: Secondary | ICD-10-CM

## 2014-01-12 DIAGNOSIS — M25529 Pain in unspecified elbow: Secondary | ICD-10-CM

## 2014-01-12 DIAGNOSIS — I4891 Unspecified atrial fibrillation: Secondary | ICD-10-CM

## 2014-01-12 DIAGNOSIS — M25521 Pain in right elbow: Secondary | ICD-10-CM

## 2014-01-12 DIAGNOSIS — H6123 Impacted cerumen, bilateral: Secondary | ICD-10-CM

## 2014-01-12 DIAGNOSIS — Z13820 Encounter for screening for osteoporosis: Secondary | ICD-10-CM

## 2014-01-12 DIAGNOSIS — H6120 Impacted cerumen, unspecified ear: Secondary | ICD-10-CM

## 2014-01-12 DIAGNOSIS — E785 Hyperlipidemia, unspecified: Secondary | ICD-10-CM

## 2014-01-12 DIAGNOSIS — R001 Bradycardia, unspecified: Secondary | ICD-10-CM

## 2014-01-12 DIAGNOSIS — I6350 Cerebral infarction due to unspecified occlusion or stenosis of unspecified cerebral artery: Secondary | ICD-10-CM

## 2014-01-12 DIAGNOSIS — D508 Other iron deficiency anemias: Secondary | ICD-10-CM

## 2014-01-12 DIAGNOSIS — Z780 Asymptomatic menopausal state: Secondary | ICD-10-CM

## 2014-01-12 DIAGNOSIS — E559 Vitamin D deficiency, unspecified: Secondary | ICD-10-CM

## 2014-01-12 DIAGNOSIS — I498 Other specified cardiac arrhythmias: Secondary | ICD-10-CM

## 2014-01-12 NOTE — Addendum Note (Signed)
Addended by: Zannie Cove on: 01/12/2014 11:53 AM   Modules accepted: Orders

## 2014-01-12 NOTE — Telephone Encounter (Signed)
Pt notified no xray needed per dr. Laurance Flatten

## 2014-01-12 NOTE — Telephone Encounter (Signed)
Please cancel the order for the x-ray as I did not think the patient needed to have one

## 2014-01-12 NOTE — Telephone Encounter (Signed)
Please advise and route to pool. Thanks

## 2014-01-12 NOTE — Patient Instructions (Addendum)
Medicare Annual Wellness Visit  [facility] and the medical providers at [facility] strive to bring you the best medical care.  In doing so we not only want to address your current medical conditions and concerns but also to detect new conditions early and prevent illness, disease and health-related problems.    Medicare offers a yearly Wellness Visit which allows our clinical staff to assess your need for preventative services including immunizations, lifestyle education, counseling to decrease risk of preventable diseases and screening for fall risk and other medical concerns.    This visit is provided free of charge (no copay) for all Medicare recipients. The clinical pharmacists at [facility] have begun to conduct these Wellness Visits which will also include a thorough review of all your medications.    As you primary medical provider recommend that you make an appointment for your Annual Wellness Visit if you have not done so already this year.  You may set up this appointment before you leave today or you may call back ([phone]) and schedule an appointment.  Please make sure when you call that you mention that you are scheduling your Annual Wellness Visit with the clinical pharmacist so that the appointment may be made for the proper length of time.      Continue current medications. Continue good therapeutic lifestyle changes which include good diet and exercise. Fall precautions discussed with patient. If an FOBT was given today- please return it to our front desk. If you are over 78 years old - you may need Prevnar 49 or the adult Pneumonia vaccine.  Drink plenty of water Continue to use safety precautions when walking Do some gentle range of motion exercises with the right elbow and use some warm wet compresses as this may help the contusion that you had heel more quickly Followup with cardiologist as  planned Debrox ear drops, over-the-counter can be used to help soften the ear wax

## 2014-01-12 NOTE — Progress Notes (Signed)
Subjective:    Patient ID: [name], female    DOB: 07/30/1931, 80 y.o.   MRN: [id number]  HPI Pt here for follow up and management of chronic medical problems. The patient complains of fatigue slow heart rate and arthralgias pain in her right elbow. She sees the cardiologist regularly. She is due to get lab work. She is also on thyroid replacement medication. She brings in home blood pressures for review and these are all good. Her pulse rates generally run in the 40s. Some of the bradycardia is attributed to the amiodarone that she has been on in the past which she no longer takes. She also discontinued her thyroid medicine that she was placed on recently peer        Patient Active Problem List   Diagnosis Date Noted  . High risk medication use 06/21/2013  . Dizziness   . Restless leg syndrome 03/01/2013  . ATRIAL FIBRILLATION 11/03/2008  . HYPERLIPIDEMIA 10/24/2008  . ANEMIA, IRON DEFICIENCY, MICROCYTIC 10/24/2008  . SINUS BRADYCARDIA 10/24/2008  . CVA 10/24/2008  . OSTEOPENIA 10/24/2008   Outpatient Encounter Prescriptions as of 01/12/2014  Medication Sig  . atorvastatin (LIPITOR) 10 MG tablet TAKE 1/2 TABLET BY MOUTH ONE TIME DAILY  . Calcium Carbonate-Vit D-Min 1200-1000 MG-UNIT CHEW Chew 1 tablet by mouth daily.  . Cholecalciferol (VITAMIN D3) 1000 UNITS CAPS Take 1 capsule by mouth daily.  . COCONUT OIL PO Take by mouth daily.  Marland Kitchen ipratropium (ATROVENT) 0.06 % nasal spray PLACE 1 SPRAY INTO EACH NOSTRIL TWICE DAILY AS NEEDED  . Magnesium 500 MG TABS Take 1 tablet by mouth daily.  . Multiple Vitamin (MULTIVITAMIN) capsule Take 1 capsule by mouth daily.    . Rivaroxaban (XARELTO) 15 MG TABS tablet Take 1 tablet (15 mg total) by mouth daily.  . [DISCONTINUED] chlorhexidine (PERIDEX) 0.12 % solution Use mouth rinse once a day  . [DISCONTINUED] fluticasone (FLONASE) 50 MCG/ACT nasal spray Place 1 spray into both nostrils daily.  . [DISCONTINUED] levothyroxine  (SYNTHROID, LEVOTHROID) 50 MCG tablet Take 1 tablet (50 mcg total) by mouth daily.    Review of Systems  Constitutional: Positive for fatigue.  HENT: Negative.   Eyes: Negative.   Respiratory: Negative.   Cardiovascular: Negative.        Bradycardia  Gastrointestinal: Negative.   Endocrine: Negative.   Genitourinary: Negative.   Musculoskeletal: Positive for arthralgias (right elbow pain).  Skin: Negative.   Allergic/Immunologic: Negative.   Neurological: Negative.   Hematological: Negative.   Psychiatric/Behavioral: Negative.        Objective:   Physical Exam  Nursing note and vitals reviewed. Constitutional: She is oriented to person, place, and time. She appears well-developed and well-nourished. No distress.  Pleasant and younger appearing than her stated age  HENT:  Head: Normocephalic and atraumatic.  Right Ear: External ear normal.  Left Ear: External ear normal.  Nose: Nose normal.  Mouth/Throat: Oropharynx is clear and moist.  Eyes: Conjunctivae and EOM are normal. Pupils are equal, round, and reactive to light. Right eye exhibits no discharge. Left eye exhibits no discharge. No scleral icterus.  Neck: Normal range of motion. Neck supple. No thyromegaly present.  No carotid bruits  Cardiovascular: Normal rate, regular rhythm, normal heart sounds and intact distal pulses.   No murmur heard. The rhythm is regular today at 60 per minute  Pulmonary/Chest: Effort normal and breath sounds normal. No respiratory distress. She has no wheezes. She has no rales. She exhibits no tenderness.  Abdominal: Soft. Bowel sounds are normal. She exhibits no mass. There is no tenderness. There is no rebound and no guarding.  Musculoskeletal: Normal range of motion. She exhibits no edema.  Lymphadenopathy:    She has no cervical adenopathy.  Neurological: She is alert and oriented to person, place, and time. She has normal reflexes. No cranial nerve deficit.  Skin: Skin is warm and  dry. No rash noted.  Psychiatric: She has a normal mood and affect. Her behavior is normal. Judgment and thought content normal.   BP 112/59  Pulse 46  Temp(Src) 98.6 F (37 C) (Oral)  Ht '5\' 3"'  (1.6 m)  Wt 124 lb (56.246 kg)  BMI 21.97 kg/m2        Assessment & Plan:  1. ANEMIA, IRON DEFICIENCY, MICROCYTIC - POCT CBC; Future  2. Atrial fibrillation - POCT CBC; Future - BMP8+EGFR; Future - Hepatic function panel; Future  3. CVA - POCT CBC; Future - BMP8+EGFR; Future - Hepatic function panel; Future  4. HYPERLIPIDEMIA - POCT CBC; Future - Hepatic function panel; Future - NMR, lipoprofile; Future  5. Right elbow pain - DG Elbow 2 Views Right; Future - POCT CBC; Future  6. Thyroid disease - POCT CBC; Future - Thyroid Panel With TSH; Future  7. Vitamin D deficiency - POCT CBC; Future - Vit D  25 hydroxy (rtn osteoporosis monitoring); Future  8. Impacted cerumen of both ears  9. Bradycardia   Patient Instructions                       Medicare Annual Wellness Visit  [facility] and the medical providers at [facility] strive to bring you the best medical care.  In doing so we not only want to address your current medical conditions and concerns but also to detect new conditions early and prevent illness, disease and health-related problems.    Medicare offers a yearly Wellness Visit which allows our clinical staff to assess your need for preventative services including immunizations, lifestyle education, counseling to decrease risk of preventable diseases and screening for fall risk and other medical concerns.    This visit is provided free of charge (no copay) for all Medicare recipients. The clinical pharmacists at [facility] have begun to conduct these Wellness Visits which will also include a thorough review of all your medications.    As you primary medical provider recommend that you make an appointment for  your Annual Wellness Visit if you have not done so already this year.  You may set up this appointment before you leave today or you may call back ([phone]) and schedule an appointment.  Please make sure when you call that you mention that you are scheduling your Annual Wellness Visit with the clinical pharmacist so that the appointment may be made for the proper length of time.      Continue current medications. Continue good therapeutic lifestyle changes which include good diet and exercise. Fall precautions discussed with patient. If an FOBT was given today- please return it to our front desk. If you are over 27 years old - you may need Prevnar 76 or the adult Pneumonia vaccine.  Drink plenty of water Continue to use safety precautions when walking Do some gentle range of motion exercises with the right elbow and use some warm wet compresses as this may help the contusion that you had heel more quickly Followup with cardiologist as planned Debrox ear drops, over-the-counter can  be used to help soften the ear wax   Arrie Senate MD

## 2014-01-17 ENCOUNTER — Other Ambulatory Visit (INDEPENDENT_AMBULATORY_CARE_PROVIDER_SITE_OTHER): Payer: Medicare PPO

## 2014-01-17 DIAGNOSIS — I6350 Cerebral infarction due to unspecified occlusion or stenosis of unspecified cerebral artery: Secondary | ICD-10-CM

## 2014-01-17 DIAGNOSIS — I4891 Unspecified atrial fibrillation: Secondary | ICD-10-CM

## 2014-01-17 DIAGNOSIS — E079 Disorder of thyroid, unspecified: Secondary | ICD-10-CM

## 2014-01-17 DIAGNOSIS — M25521 Pain in right elbow: Secondary | ICD-10-CM

## 2014-01-17 DIAGNOSIS — D508 Other iron deficiency anemias: Secondary | ICD-10-CM

## 2014-01-17 DIAGNOSIS — E559 Vitamin D deficiency, unspecified: Secondary | ICD-10-CM

## 2014-01-17 DIAGNOSIS — E785 Hyperlipidemia, unspecified: Secondary | ICD-10-CM

## 2014-01-17 DIAGNOSIS — M25529 Pain in unspecified elbow: Secondary | ICD-10-CM

## 2014-01-17 LAB — POCT CBC
Granulocyte percent: 64.1 %G (ref 37–80)
HCT, POC: 38.7 % (ref 37.7–47.9)
Hemoglobin: 12.3 g/dL (ref 12.2–16.2)
LYMPH, POC: 2 (ref 0.6–3.4)
MCH: 30 pg (ref 27–31.2)
MCHC: 31.7 g/dL — AB (ref 31.8–35.4)
MCV: 94.7 fL (ref 80–97)
MPV: 7.9 fL (ref 0–99.8)
POC Granulocyte: 4 (ref 2–6.9)
POC LYMPH PERCENT: 31.2 %L (ref 10–50)
Platelet Count, POC: 240 10*3/uL (ref 142–424)
RBC: 4.1 M/uL (ref 4.04–5.48)
RDW, POC: 13.4 %
WBC: 6.3 10*3/uL (ref 4.6–10.2)

## 2014-01-17 NOTE — Progress Notes (Signed)
Pt came in for labs only 

## 2014-01-18 LAB — THYROID PANEL WITH TSH
Free Thyroxine Index: 2.5 (ref 1.2–4.9)
T3 Uptake Ratio: 33 % (ref 24–39)
T4, Total: 7.7 ug/dL (ref 4.5–12.0)
TSH: 4.63 u[IU]/mL — AB (ref 0.450–4.500)

## 2014-01-18 LAB — BMP8+EGFR
BUN/Creatinine Ratio: 16 (ref 11–26)
BUN: 18 mg/dL (ref 8–27)
CO2: 28 mmol/L (ref 18–29)
CREATININE: 1.1 mg/dL — AB (ref 0.57–1.00)
Calcium: 9.8 mg/dL (ref 8.7–10.3)
Chloride: 103 mmol/L (ref 97–108)
GFR calc Af Amer: 55 mL/min/{1.73_m2} — ABNORMAL LOW (ref >59)
GFR calc non Af Amer: 48 mL/min/{1.73_m2} — ABNORMAL LOW (ref >59)
Glucose: 87 mg/dL (ref 65–99)
Potassium: 5.3 mmol/L — ABNORMAL HIGH (ref 3.5–5.2)
SODIUM: 141 mmol/L (ref 134–144)

## 2014-01-18 LAB — NMR, LIPOPROFILE
Cholesterol: 156 mg/dL (ref 100–199)
HDL CHOLESTEROL BY NMR: 67 mg/dL (ref >39)
HDL Particle Number: 37.5 umol/L (ref >=30.5)
LDL Particle Number: 660 nmol/L (ref <1000)
LDL SIZE: 21.3 nm (ref >20.5)
LDLC SERPL CALC-MCNC: 76 mg/dL (ref 0–99)
LP-IR Score: <25 (ref <=45)
Small LDL Particle Number: 161 nmol/L (ref <=527)
Triglycerides by NMR: 64 mg/dL (ref 0–149)

## 2014-01-18 LAB — HEPATIC FUNCTION PANEL
ALT: 11 IU/L (ref 0–32)
AST: 22 IU/L (ref 0–40)
Albumin: 4 g/dL (ref 3.5–4.7)
Alkaline Phosphatase: 63 IU/L (ref 39–117)
BILIRUBIN TOTAL: 0.4 mg/dL (ref 0.0–1.2)
Bilirubin, Direct: 0.15 mg/dL (ref 0.00–0.40)
Total Protein: 6.4 g/dL (ref 6.0–8.5)

## 2014-01-18 LAB — VITAMIN D 25 HYDROXY (VIT D DEFICIENCY, FRACTURES): VIT D 25 HYDROXY: 49.3 ng/mL (ref 30.0–100.0)

## 2014-01-21 ENCOUNTER — Telehealth: Payer: Self-pay | Admitting: Family Medicine

## 2014-01-21 NOTE — Telephone Encounter (Signed)
Patient aware and has appointment for lab work

## 2014-02-09 ENCOUNTER — Ambulatory Visit (INDEPENDENT_AMBULATORY_CARE_PROVIDER_SITE_OTHER): Payer: Medicare PPO | Admitting: Internal Medicine

## 2014-02-09 ENCOUNTER — Telehealth: Payer: Self-pay | Admitting: Internal Medicine

## 2014-02-09 ENCOUNTER — Other Ambulatory Visit: Payer: Self-pay

## 2014-02-09 VITALS — BP 130/82 | HR 108 | Ht 63.0 in | Wt 124.0 lb

## 2014-02-09 DIAGNOSIS — I495 Sick sinus syndrome: Secondary | ICD-10-CM

## 2014-02-09 DIAGNOSIS — I4891 Unspecified atrial fibrillation: Secondary | ICD-10-CM

## 2014-02-09 MED ORDER — DILTIAZEM HCL ER COATED BEADS 180 MG PO CP24: 180.0000 mg | ORAL_CAPSULE | Freq: Every day | ORAL | Status: DC

## 2014-02-09 NOTE — Telephone Encounter (Signed)
Spoke with Dr. Rayann Heman and called patient. She will come to office today for EKG to eval rhythm.

## 2014-02-09 NOTE — Patient Instructions (Signed)
Your physician has recommended you make the following change in your medication: BEGIN CARDIZEM CD [place]. Your physician recommends that you schedule a follow-up appointment ON July 20 [place].

## 2014-02-09 NOTE — Telephone Encounter (Signed)
New message     Pt has had an irr heartbeat since June 30th.  Everyday it is irregular.  Please advise.

## 2014-02-09 NOTE — Telephone Encounter (Signed)
Spoke with pt, since June 30th her monitor has reported an irregular heart beat. Her pulse is ranging from 109-93 by the machine. She does not feel any racing, chest pain or SOB. She tires a little sooner than usual. Her amiodarone was stopped at the last office visit with dr allred. She is on no medications for rate control. Will forward for dr allred review

## 2014-02-12 NOTE — Progress Notes (Signed)
PCP:  [name], MD  The patient presents today for urgent add on electrophysiology followup as she has returned to afib.  Last visit, she stopped her amiodarone.  She has subsequently developed return of afib.  She reports symptoms of tachypalpitations and fatigue.  She also has mild SOB. Today, she denies symptoms of chest pain, orthopnea, PND, lower extremity edema, dizziness, presyncope, syncope, or neurologic sequela.  The patient feels that she is tolerating medications without difficulties and is otherwise without complaint today.   Past Medical History  Diagnosis Date  . Hyperlipidemia   . Microcytic anemia   . Osteopenia   . 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 Persistent atrial fibrillation     s/p PVI 04/2010  . Sinus bradycardia   . CVA (cerebral vascular accident) 2007  . Arthritis   . Dizziness    Past Surgical History  Procedure Laterality Date  . Breast mass resected    . Afib ablation  04/2010    PVI with CTI ablation performed by [facility]  . Cardioversion      for afib  . Cataract extraction w/phaco  11/11/2011    Procedure: CATARACT EXTRACTION PHACO AND INTRAOCULAR LENS PLACEMENT (IOC);  Surgeon: Tonny Branch, MD;  Location: [facility];  Service: Ophthalmology;  Laterality: Left;  CDE:12.62  . Cataract extraction w/phaco  11/25/2011    Procedure: CATARACT EXTRACTION PHACO AND INTRAOCULAR LENS PLACEMENT (IOC);  Surgeon: Tonny Branch, MD;  Location: [facility];  Service: Ophthalmology;  Laterality: Right;  CDE 16.14    Current Outpatient Prescriptions  Medication Sig Dispense Refill  . atorvastatin (LIPITOR) 10 MG tablet TAKE 1/2 TABLET BY MOUTH ONE TIME DAILY      . Calcium Carbonate-Vit D-Min 1200-1000 MG-UNIT CHEW Chew 1 tablet by mouth daily.      . Cholecalciferol (VITAMIN D3) 1000 UNITS CAPS Take 1 capsule by mouth daily.      . COCONUT OIL PO Take by mouth daily.      Marland Kitchen diltiazem (CARDIZEM CD) 180 MG 24 hr capsule Take 1 capsule (180 mg total) by mouth  daily.  90 capsule  3  . ipratropium (ATROVENT) 0.06 % nasal spray PLACE 1 SPRAY INTO EACH NOSTRIL TWICE DAILY AS NEEDED  15 mL  2  . Magnesium 500 MG TABS Take 1 tablet by mouth daily.      . Multiple Vitamin (MULTIVITAMIN) capsule Take 1 capsule by mouth daily.        . Rivaroxaban (XARELTO) 15 MG TABS tablet Take 1 tablet (15 mg total) by mouth daily.  30 tablet  11  . [DISCONTINUED] Calcium Carbonate (CALCIUM 500 PO) Take 1 tablet by mouth daily.         No current facility-administered medications for this visit.    No Known Allergies  History   Social History  . Marital Status: Widowed    Spouse Name: N/A    Number of Children: 0  . Years of Education: college   Occupational History  . retired    Social History Main Topics  . Smoking status: Former Smoker -- 1.00 packs/day for 30 years    Types: Cigarettes  . Smokeless tobacco: Never Used     Comment: quit in 1980s  . Alcohol Use: No  . Drug Use: No  . Sexual Activity: Not on file   Other Topics Concern  . Not on file   Social History Narrative   Patient is retired and lives at  home alone. Patient has business college education.    Right handed.   Caffeine- some times - soda     Family History  Problem Relation Age of Onset  . Stroke Mother   . Anesthesia problems Neg Hx   . Hypotension Neg Hx   . Malignant hyperthermia Neg Hx   . Pseudochol deficiency Neg Hx   . Lung cancer Father     ROS-  All systems are reviewed and are negative except as outlined in the HPI above  Physical Exam: HR- 100 bpm  GEN- The patient is anxious appearing, alert and oriented x 3 today.   Head- normocephalic, atraumatic Eyes-  Sclera clear, conjunctiva pink Ears- hearing intact Oropharynx- clear Neck- supple, no JVP  Lungs- Clear to ausculation bilaterally, normal work of breathing Heart- tachycardic irregular rhythm GI- soft, NT, ND, + BS Extremities- no clubbing, cyanosis, or edema Neuro- strength and sensation are  intact  ekg today reveals afib, V rate 100 bpm  Assessment and Plan:  1. afib She has returned to afib and is symptomatic, likely due to elevated ventricular rates.  At this time, we will add cardizem for rate control.  Continue anticoagulation with xarelto.  She will return to follow-up with Roderic Palau in the afib clinic in 2 weeks.  At that time, we will consider rate control long term vs tikosyn, amiodarone, or repeat ablation.  2. Symptomatic sinus bradycardia The patient has prior sinus bradycardia with fatigue.  She now has afib with elevated V rates.  She would like to avoid PPM if possible.  I will start cardizem (as above).  I think that we might want to avoid amiodarone long term due to prior bradycardia.  Tikosyn or ablation would therefore be better options.

## 2014-02-21 ENCOUNTER — Ambulatory Visit (INDEPENDENT_AMBULATORY_CARE_PROVIDER_SITE_OTHER): Payer: Medicare PPO | Admitting: Internal Medicine

## 2014-02-21 ENCOUNTER — Encounter: Payer: Self-pay | Admitting: Internal Medicine

## 2014-02-21 VITALS — BP 128/86 | HR 102 | Ht 63.0 in | Wt 126.4 lb

## 2014-02-21 DIAGNOSIS — I4891 Unspecified atrial fibrillation: Secondary | ICD-10-CM

## 2014-02-21 NOTE — Patient Instructions (Signed)
Your physician recommends that you schedule a follow-up appointment in: 6 weeks with Dr Rayann Heman   Your physician recommends that you continue on your current medications as directed. Please refer to the Current Medication list given to you today.

## 2014-02-21 NOTE — Progress Notes (Signed)
PCP:  [name], MD  The patient presents today for urgent add on electrophysiology followup as she has returned to afib.  She appears to be doing quite well with rate control alone.  She is unable to tell that she is in afib most of the time.  She does have rare palpitations.  Her energy is preserved.  Today, she denies symptoms of chest pain, orthopnea, PND, lower extremity edema, dizziness, presyncope, syncope, or neurologic sequela.  The patient feels that she is tolerating medications without difficulties and is otherwise without complaint today.   Past Medical History  Diagnosis Date  . Hyperlipidemia   . Microcytic anemia   . Osteopenia   . 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 Persistent atrial fibrillation     s/p PVI 04/2010  . Sinus bradycardia   . CVA (cerebral vascular accident) 2007  . Arthritis   . Dizziness    Past Surgical History  Procedure Laterality Date  . Breast mass resected    . Afib ablation  04/2010    PVI with CTI ablation performed by [facility]  . Cardioversion      for afib  . Cataract extraction w/phaco  11/11/2011    Procedure: CATARACT EXTRACTION PHACO AND INTRAOCULAR LENS PLACEMENT (IOC);  Surgeon: Tonny Branch, MD;  Location: [facility];  Service: Ophthalmology;  Laterality: Left;  CDE:12.62  . Cataract extraction w/phaco  11/25/2011    Procedure: CATARACT EXTRACTION PHACO AND INTRAOCULAR LENS PLACEMENT (IOC);  Surgeon: Tonny Branch, MD;  Location: [facility];  Service: Ophthalmology;  Laterality: Right;  CDE 16.14    Current Outpatient Prescriptions  Medication Sig Dispense Refill  . atorvastatin (LIPITOR) 10 MG tablet TAKE 1/2 TABLET BY MOUTH ONE TIME DAILY      . Calcium Carbonate-Vit D-Min 1200-1000 MG-UNIT CHEW Chew 1 tablet by mouth daily.      . chlorhexidine (PERIDEX) 0.12 % solution Mouthwash - Use as dierected      . Cholecalciferol (VITAMIN D3) 1000 UNITS CAPS Take 1 capsule by mouth daily.      . COCONUT OIL PO Take by mouth  daily.      Marland Kitchen diltiazem (CARDIZEM CD) 180 MG 24 hr capsule Take 1 capsule (180 mg total) by mouth daily.  90 capsule  3  . ipratropium (ATROVENT) 0.06 % nasal spray PLACE 1 SPRAY INTO EACH NOSTRIL TWICE DAILY AS NEEDED  15 mL  2  . Magnesium 500 MG TABS Take 1 tablet by mouth daily.      . Multiple Vitamin (MULTIVITAMIN) capsule Take 1 capsule by mouth daily.        . Rivaroxaban (XARELTO) 15 MG TABS tablet Take 1 tablet (15 mg total) by mouth daily.  30 tablet  11  . [DISCONTINUED] Calcium Carbonate (CALCIUM 500 PO) Take 1 tablet by mouth daily.         No current facility-administered medications for this visit.    No Known Allergies  History   Social History  . Marital Status: Widowed    Spouse Name: N/A    Number of Children: 0  . Years of Education: college   Occupational History  . retired    Social History Main Topics  . Smoking status: Former Smoker -- 1.00 packs/day for 30 years    Types: Cigarettes  . Smokeless tobacco: Never Used     Comment: quit in 1980s  . Alcohol Use: No  . Drug Use: No  . Sexual  Activity: Not on file   Other Topics Concern  . Not on file   Social History Narrative   Patient is retired and lives at home alone. Patient has business college education.    Right handed.   Caffeine- some times - soda     Family History  Problem Relation Age of Onset  . Stroke Mother   . Anesthesia problems Neg Hx   . Hypotension Neg Hx   . Malignant hyperthermia Neg Hx   . Pseudochol deficiency Neg Hx   . Lung cancer Father     ROS-  All systems are reviewed and are negative except as outlined in the HPI above  Physical Exam: Filed Vitals:   02/21/14 1126  BP: 128/86  Pulse: 102    GEN- The patient is well appearing, alert and oriented x 3 today.   Head- normocephalic, atraumatic Eyes-  Sclera clear, conjunctiva pink Ears- hearing intact Oropharynx- clear Neck- supple, no JVP  Lungs- Clear to ausculation bilaterally, normal work of  breathing Heart-irrr GI- soft, NT, ND, + BS Extremities- no clubbing, cyanosis, or edema Neuro- strength and sensation are intact  ekg today reveals afib, V rate 92 bpm  Assessment and Plan:  1. afib She has returned to afib and is minimally symptomatic with better rate control.  I will therefore continue rate control at this time.  We will increase diltiazem to 240mg  daily upon return if needed. If her afib symptoms progress then our options would be tikosyn (prefered), return to amiodarone, or repeat ablation. Continue anticoagulation with xarelto.  She will return to follow-up with Roderic Palau in the afib clinic in 4-6 weeks.   2. Symptomatic sinus bradycardia V rates are presently stable in afib  Return in 6 weeks to the afib clinic

## 2014-03-08 ENCOUNTER — Other Ambulatory Visit (INDEPENDENT_AMBULATORY_CARE_PROVIDER_SITE_OTHER): Payer: Medicare PPO

## 2014-03-08 DIAGNOSIS — R7989 Other specified abnormal findings of blood chemistry: Secondary | ICD-10-CM

## 2014-03-09 LAB — THYROID PANEL WITH TSH
Free Thyroxine Index: 1.6 (ref 1.2–4.9)
T3 UPTAKE RATIO: 31 % (ref 24–39)
T4 TOTAL: 5.1 ug/dL (ref 4.5–12.0)
TSH: 4.17 u[IU]/mL (ref 0.450–4.500)

## 2014-03-09 LAB — BMP8+EGFR
BUN/Creatinine Ratio: 20 (ref 11–26)
BUN: 20 mg/dL (ref 8–27)
CALCIUM: 9.3 mg/dL (ref 8.7–10.3)
CO2: 25 mmol/L (ref 18–29)
CREATININE: 1.01 mg/dL — AB (ref 0.57–1.00)
Chloride: 99 mmol/L (ref 97–108)
GFR calc Af Amer: 60 mL/min/{1.73_m2} (ref >59)
GFR, EST NON AFRICAN AMERICAN: 52 mL/min/{1.73_m2} — AB (ref >59)
Glucose: 86 mg/dL (ref 65–99)
Potassium: 4.9 mmol/L (ref 3.5–5.2)
SODIUM: 138 mmol/L (ref 134–144)

## 2014-04-04 ENCOUNTER — Ambulatory Visit: Payer: Medicare PPO | Admitting: Internal Medicine

## 2014-04-12 ENCOUNTER — Other Ambulatory Visit: Payer: Self-pay | Admitting: Family Medicine

## 2014-04-20 ENCOUNTER — Encounter: Payer: Self-pay | Admitting: Pharmacist

## 2014-04-20 ENCOUNTER — Ambulatory Visit (INDEPENDENT_AMBULATORY_CARE_PROVIDER_SITE_OTHER): Payer: Medicare PPO | Admitting: Pharmacist

## 2014-04-20 ENCOUNTER — Ambulatory Visit (INDEPENDENT_AMBULATORY_CARE_PROVIDER_SITE_OTHER): Payer: Medicare PPO

## 2014-04-20 VITALS — BP 102/62 | HR 63 | Ht 61.5 in | Wt 125.5 lb

## 2014-04-20 DIAGNOSIS — M8580 Other specified disorders of bone density and structure, unspecified site: Secondary | ICD-10-CM

## 2014-04-20 DIAGNOSIS — Z13820 Encounter for screening for osteoporosis: Secondary | ICD-10-CM

## 2014-04-20 DIAGNOSIS — M949 Disorder of cartilage, unspecified: Secondary | ICD-10-CM

## 2014-04-20 DIAGNOSIS — Z0000 Encounter for general adult medical examination without abnormal findings: Secondary | ICD-10-CM

## 2014-04-20 DIAGNOSIS — Z780 Asymptomatic menopausal state: Secondary | ICD-10-CM

## 2014-04-20 DIAGNOSIS — M899 Disorder of bone, unspecified: Secondary | ICD-10-CM

## 2014-04-20 LAB — HM DEXA SCAN

## 2014-04-20 NOTE — Progress Notes (Signed)
Patient ID: [name], female   DOB: 1930-10-14, 80 y.o.   MRN: [id number] Subjective:    [name] is a 80 y.o. female who presents for Medicare Initial Wellness Visit and to follow up osteopenia - having DEXA today. No history of fractures and no current pharmacotherapy for ostoepenia.  Preventive Screening-Counseling & Management  Tobacco History  Smoking status  . Former Smoker -- 1.00 packs/day for 30 years  . Types: Cigarettes  Smokeless tobacco  . Never Used    Comment: quit in 1980s     Current Problems (verified) Patient Active Problem List   Diagnosis Date Noted  . High risk medication use 06/21/2013  . Dizziness   . Restless leg syndrome 03/01/2013  . ATRIAL FIBRILLATION 11/03/2008  . HYPERLIPIDEMIA 10/24/2008  . ANEMIA, IRON DEFICIENCY, MICROCYTIC 10/24/2008  . SINUS BRADYCARDIA 10/24/2008  . CVA 10/24/2008  . OSTEOPENIA 10/24/2008    Medications Prior to Visit Current Outpatient Prescriptions on File Prior to Visit  Medication Sig Dispense Refill  . Calcium Carbonate-Vit D-Min 1200-1000 MG-UNIT CHEW Chew 1 tablet by mouth daily.      . chlorhexidine (PERIDEX) 0.12 % solution Mouthwash - Use as dierected      . Cholecalciferol (VITAMIN D3) 1000 UNITS CAPS Take 1 capsule by mouth daily.      . COCONUT OIL PO Take by mouth daily.      Marland Kitchen diltiazem (CARDIZEM CD) 180 MG 24 hr capsule Take 1 capsule (180 mg total) by mouth daily.  90 capsule  3  . ipratropium (ATROVENT) 0.06 % nasal spray PLACE 1 SPRAY INTO EACH NOSTRIL TWICE DAILY AS NEEDED  15 mL  2  . Magnesium 500 MG TABS Take 1 tablet by mouth daily.      . Multiple Vitamin (MULTIVITAMIN) capsule Take 1 capsule by mouth daily.        . Rivaroxaban (XARELTO) 15 MG TABS tablet Take 1 tablet (15 mg total) by mouth daily.  30 tablet  11  . [DISCONTINUED] Calcium Carbonate (CALCIUM 500 PO) Take 1 tablet by mouth daily.         No current facility-administered medications on file prior to  visit.    Current Medications (verified) Current Outpatient Prescriptions  Medication Sig Dispense Refill  . atorvastatin (LIPITOR) 10 MG tablet Take 5 mg by mouth daily. Take as directecd      . Calcium Carbonate-Vit D-Min 1200-1000 MG-UNIT CHEW Chew 1 tablet by mouth daily.      . chlorhexidine (PERIDEX) 0.12 % solution Mouthwash - Use as dierected      . Cholecalciferol (VITAMIN D3) 1000 UNITS CAPS Take 1 capsule by mouth daily.      . COCONUT OIL PO Take by mouth daily.      Marland Kitchen diltiazem (CARDIZEM CD) 180 MG 24 hr capsule Take 1 capsule (180 mg total) by mouth daily.  90 capsule  3  . ipratropium (ATROVENT) 0.06 % nasal spray PLACE 1 SPRAY INTO EACH NOSTRIL TWICE DAILY AS NEEDED  15 mL  2  . Magnesium 500 MG TABS Take 1 tablet by mouth daily.      . Multiple Vitamin (MULTIVITAMIN) capsule Take 1 capsule by mouth daily.        . Rivaroxaban (XARELTO) 15 MG TABS tablet Take 1 tablet (15 mg total) by mouth daily.  30 tablet  11  . [DISCONTINUED] Calcium Carbonate (CALCIUM 500 PO) Take 1 tablet by mouth daily.         No  current facility-administered medications for this visit.     Allergies (verified) Review of patient's allergies indicates no known allergies.   PAST HISTORY  Family History Family History  Problem Relation Age of Onset  . Stroke Mother 16  . Hip fracture Mother   . Anesthesia problems Neg Hx   . Hypotension Neg Hx   . Malignant hyperthermia Neg Hx   . Pseudochol deficiency Neg Hx   . Lung cancer Father 25    Social History History  Substance Use Topics  . Smoking status: Former Smoker -- 1.00 packs/day for 30 years    Types: Cigarettes  . Smokeless tobacco: Never Used     Comment: quit in 1980s  . Alcohol Use: No      Calcium Assessment Calcium Intake  # of servings/day  Calcium mg  Milk (8 oz) 1  x  300  = 300mg   Yogurt (4 oz) 0 x  200 = 0  Cheese (1 oz) 0 x  200 = 0  Other Calcium sources   250mg   Ca supplement Calcium 500mg  qd and MVI qd =  900mg    Estimated calcium intake per day 1450mg    Are there smokers in your home (other than you)? No  Risk Factors Current exercise habits: participates in [employer];  She exercises 3-4 times per week; dances 1-2 timer per week  Dietary issues discussed: none   Cardiac risk factors: advanced age (older than 62 for men, 35 for women) and dyslipidemia.  Depression Screen (Note: if answer to either of the following is "Yes", a more complete depression screening is indicated)   Over the past 2 weeks, have you felt down, depressed or hopeless? Yes  Over the past 2 weeks, have you felt little interest or pleasure in doing things? No  Have you lost interest or pleasure in daily life? No  Do you often feel hopeless? No  Do you cry easily over simple problems? No  Activities of Daily Living In your present state of health, do you have any difficulty performing the following activities?:  Driving? No Managing money?  No Feeding yourself? No Getting from bed to chair? No  Climbing a flight of stairs? No Preparing food and eating?: No Bathing or showering? No Getting dressed: No Getting to the toilet? No Using the toilet:No Moving around from place to place: No In the past year have you fallen or had a near fall?:No   Are you sexually active?  No  Do you have more than one partner?  No  Hearing Difficulties: Yes - is due to see Dr Romeo Rabon / audiologist.  She has hearing aid but does not wear regularly Do you often ask people to speak up or repeat themselves? Yes Do you experience ringing or noises in your ears? No Do you have difficulty understanding soft or whispered voices? Yes   Do you feel that you have a problem with memory? No  Do you often misplace items? No  Do you feel safe at home?  Yes  Cognitive Testing  Alert? Yes  Normal Appearance?Yes  Oriented to person? Yes  Place? Yes   Time? Yes  Recall of three objects?  Yes  Can perform simple calculations? Yes  Displays  appropriate judgment?Yes  Can read the correct time from a watch face?Yes   Advanced Directives have been discussed with the patient? Yes  List the Names of Other Physician/Practitioners you currently use: 1.  Cardiologist - Allred 2.  Neurologist - Krista Blue 3.  Audiologist - Pahel  Indicate any recent Medical Services you may have received from other than [facility] providers in the past year (date may be approximate).  Immunization History  Administered Date(s) Administered  . Influenza,inj,Quad PF,36+ Mos 07/27/2013  . Pneumococcal Conjugate-13 10/07/2013    Screening Tests Health Maintenance  Topic Date Due  . Colonoscopy  01/27/1983  . Zostavax  01/26/1993  . Influenza Vaccine  03/05/2014  . Mammogram  05/31/2014  . Tetanus/tdap  05/06/2021  . Pneumococcal Polysaccharide Vaccine Age 100 And Over  Completed    All answers were reviewed with the patient and necessary referrals were made:  Cherre Robins, [facility]   04/20/2014   History reviewed: allergies, current medications, past family history, past medical history, past social history, past surgical history and problem list   Objective:   Body mass index is 23.33 kg/(m^2). BP 102/62  Pulse 63  Ht 5' 1.5" (1.562 m)  Wt 125 lb 8 oz (56.926 kg)  BMI 23.33 kg/m2   DEXA Results Date of Test T-Score for AP Spine L1-L4 T-Score for Total Left Hip T-Score for Total Right Hip T-Score for neck of Left Hip T-Score for neck of Right Hip  04/20/2014 -0.2 -0.9 -0.7 -1.4 -1.5  02/12/2012 0.1 -0.5 -0.5 -1.3 -1.1  10/21/2007 0.8 -0.4 -0.5 -1.1 -1.0  08/19/2005 0.6 -0.3 -0.3 -1.2 -1.0   FRAX 10 year estimate: Total FX risk:  23%  (consider medication if >/= 20%) Hip FX risk:  14%  (consider medication if >/= 3%)     Assessment:     Initial Medicare Wellness Visit Ostopenia - stable with high fracture risk.    Plan:     During the course of the visit the patient was educated and counseled about appropriate screening and  preventive services including:    Pneumococcal vaccine - completed  Influenza vaccine - plan to get October 2015  Td vaccine - UTD  Screening mammography - Due October 2015  Screening Pap smear and pelvic exam - UTD  Bone densitometry screening - Done today  Colorectal cancer screening - no longer required to get colonoscopies per Dr Amedeo Plenty but FOBT due 06/2014  Diabetes screening  Glaucoma screening  Advanced directives: has an advanced directive - a copy HAS NOT been provided.  Discussed treatment options for osteopenia - Evista contraindicated due to history of stroke.  Bisphosphonates not started due to patient's history of dental inplants.  Continue to check DEXA every 2 years.    Continue calcium 1200mg  daily through supplementation or diet.   Continue weight bearing exercise - 30 minutes at least 4 days per week.    Counseled and educated about fall risk and prevention.  Checked into cost of Zostavax - $40 - since patient will get Influenza vaccine within next month will plan to give Zostavax in November or December.   Patient Instructions (the written plan) was given to the patient.  Medicare Attestation I have personally reviewed: The patient's medical and social history Their use of alcohol, tobacco or illicit drugs Their current medications and supplements The patient's functional ability including ADLs,fall risks, home safety risks, cognitive, and hearing and visual impairment Diet and physical activities Evidence for depression or mood disorders  The patient's weight, height, BMI, and HR/BP have been recorded in the chart.  I have made referrals, counseling, and provided education to the patient based on review of the above and I have provided the patient with a written personalized care plan for preventive services.  Cherre Robins, [facility]   04/20/2014

## 2014-04-20 NOTE — Patient Instructions (Addendum)
Health Maintenance Summary    COLONOSCOPY No longer  Required Last 05/2000    ZOSTAVAX Overdue 01/26/1993  will get in Nov or Dec - cost $40    INFLUENZA VACCINE Overdue 03/05/2014  Will get this Fall 2015   Pneumonia Vaccine Completed     DEXA/ Bone Density Next Due  04/20/2016 Last 09//16/2015   Stool Check - FOBT Next Due  06/2014 Last 06/2013    MAMMOGRAM Next Due 05/31/2014  Last 05/31/2013    TETANUS/TDAP Next Due 05/06/2021  Last 05/07/2011      Fall Prevention and Home Safety Falls cause injuries and can affect all age groups. It is possible to use preventive measures to significantly decrease the likelihood of falls. There are many simple measures which can make your home safer and prevent falls. OUTDOORS  Repair cracks and edges of walkways and driveways.  Remove high doorway thresholds.  Trim shrubbery on the main path into your home.  Have good outside lighting.  Clear walkways of tools, rocks, debris, and clutter.  Check that handrails are not broken and are securely fastened. Both sides of steps should have handrails.  Have leaves, snow, and ice cleared regularly.  Use sand or salt on walkways during winter months.  In the garage, clean up grease or oil spills. BATHROOM  Install night lights.  Install grab bars by the toilet and in the tub and shower.  Use non-skid mats or decals in the tub or shower.  Place a plastic non-slip stool in the shower to sit on, if needed.  Keep floors dry and clean up all water on the floor immediately.  Remove soap buildup in the tub or shower on a regular basis.  Secure bath mats with non-slip, double-sided rug tape.  Remove throw rugs and tripping hazards from the floors. BEDROOMS  Install night lights.  Make sure a bedside light is easy to reach.  Do not use oversized bedding.  Keep a telephone by your bedside.  Have a firm chair with side arms to use for getting dressed.  Remove throw rugs and tripping hazards  from the floor. KITCHEN  Keep handles on pots and pans turned toward the center of the stove. Use back burners when possible.  Clean up spills quickly and allow time for drying.  Avoid walking on wet floors.  Avoid hot utensils and knives.  Position shelves so they are not too high or low.  Place commonly used objects within easy reach.  If necessary, use a sturdy step stool with a grab bar when reaching.  Keep electrical cables out of the way.  Do not use floor polish or wax that makes floors slippery. If you must use wax, use non-skid floor wax.  Remove throw rugs and tripping hazards from the floor. STAIRWAYS  Never leave objects on stairs.  Place handrails on both sides of stairways and use them. Fix any loose handrails. Make sure handrails on both sides of the stairways are as long as the stairs.  Check carpeting to make sure it is firmly attached along stairs. Make repairs to worn or loose carpet promptly.  Avoid placing throw rugs at the top or bottom of stairways, or properly secure the rug with carpet tape to prevent slippage. Get rid of throw rugs, if possible.  Have an electrician put in a light switch at the top and bottom of the stairs. OTHER FALL PREVENTION TIPS  Wear low-heel or rubber-soled shoes that are supportive and fit well. Wear closed  toe shoes.  When using a stepladder, make sure it is fully opened and both spreaders are firmly locked. Do not climb a closed stepladder.  Add color or contrast paint or tape to grab bars and handrails in your home. Place contrasting color strips on first and last steps.  Learn and use mobility aids as needed. Install an electrical emergency response system.  Turn on lights to avoid dark areas. Replace light bulbs that burn out immediately. Get light switches that glow.  Arrange furniture to create clear pathways. Keep furniture in the same place.  Firmly attach carpet with non-skid or double-sided tape.  Eliminate  uneven floor surfaces.  Select a carpet pattern that does not visually hide the edge of steps.  Be aware of all pets. OTHER HOME SAFETY TIPS  Set the water temperature for 120 F (48.8 C).  Keep emergency numbers on or near the telephone.  Keep smoke detectors on every level of the home and near sleeping areas. Document Released: 07/12/2002 Document Revised: 01/21/2012 Document Reviewed: 10/11/2011 [facility] Patient Information 2015 [place], [place]. This information is not intended to replace advice given to you by your health care provider. Make sure you discuss any questions you have with your health care provider.  Preventive Care for Adults A healthy lifestyle and preventive care can promote health and wellness. Preventive health guidelines for women include the following key practices.  A routine yearly physical is a good way to check with your health care provider about your health and preventive screening. It is a chance to share any concerns and updates on your health and to receive a thorough exam.  Visit your dentist for a routine exam and preventive care every 6 months. Brush your teeth twice a day and floss once a day. Good oral hygiene prevents tooth decay and gum disease.  The frequency of eye exams is based on your age, health, family medical history, use of contact lenses, and other factors. Follow your health care provider's recommendations for frequency of eye exams.  Eat a healthy diet. Foods like vegetables, fruits, whole grains, low-fat dairy products, and lean protein foods contain the nutrients you need without too many calories. Decrease your intake of foods high in solid fats, added sugars, and salt. Eat the right amount of calories for you.Get information about a proper diet from your health care provider, if necessary.  Regular physical exercise is one of the most important things you can do for your health. Most adults should get at least 150 minutes of  moderate-intensity exercise (any activity that increases your heart rate and causes you to sweat) each week. In addition, most adults need muscle-strengthening exercises on 2 or more days a week.  Maintain a healthy weight. The body mass index (BMI) is a screening tool to identify possible weight problems. It provides an estimate of body fat based on height and weight. Your health care provider can find your BMI and can help you achieve or maintain a healthy weight.For adults 20 years and older:  A BMI below 18.5 is considered underweight.  A BMI of 18.5 to 24.9 is normal.  A BMI of 25 to 29.9 is considered overweight.  A BMI of 30 and above is considered obese.  Maintain normal blood lipids and cholesterol levels by exercising and minimizing your intake of saturated fat. Eat a balanced diet with plenty of fruit and vegetables. Blood tests for lipids and cholesterol should begin at age 23 and be repeated every 5 years. If  your lipid or cholesterol levels are high, you are over 50, or you are at high risk for heart disease, you may need your cholesterol levels checked more frequently.Ongoing high lipid and cholesterol levels should be treated with medicines if diet and exercise are not working.  If you smoke, find out from your health care provider how to quit. If you do not use tobacco, do not start.  Lung cancer screening is recommended for adults aged 42-80 years who are at high risk for developing lung cancer because of a history of smoking. A yearly low-dose CT scan of the lungs is recommended for people who have at least a 30-pack-year history of smoking and are a current smoker or have quit within the past 15 years. A pack year of smoking is smoking an average of 1 pack of cigarettes a day for 1 year (for example: 1 pack a day for 30 years or 2 packs a day for 15 years). Yearly screening should continue until the smoker has stopped smoking for at least 15 years. Yearly screening should be  stopped for people who develop a health problem that would prevent them from having lung cancer treatment.  If you are pregnant, do not drink alcohol. If you are breastfeeding, be very cautious about drinking alcohol. If you are not pregnant and choose to drink alcohol, do not have more than 1 drink per day. One drink is considered to be 12 ounces (355 mL) of beer, 5 ounces (148 mL) of wine, or 1.5 ounces (44 mL) of liquor.  Avoid use of street drugs. Do not share needles with anyone. Ask for help if you need support or instructions about stopping the use of drugs.  High blood pressure causes heart disease and increases the risk of stroke. Your blood pressure should be checked at least every 1 to 2 years. Ongoing high blood pressure should be treated with medicines if weight loss and exercise do not work.  If you are 74-69 years old, ask your health care provider if you should take aspirin to prevent strokes.  Diabetes screening involves taking a blood sample to check your fasting blood sugar level. This should be done once every 3 years, after age 60, if you are within normal weight and without risk factors for diabetes. Testing should be considered at a younger age or be carried out more frequently if you are overweight and have at least 1 risk factor for diabetes.  Breast cancer screening is essential preventive care for women. You should practice "breast self-awareness." This means understanding the normal appearance and feel of your breasts and may include breast self-examination. Any changes detected, no matter how small, should be reported to a health care provider. Women in their 42s and 30s should have a clinical breast exam (CBE) by a health care provider as part of a regular health exam every 1 to 3 years. After age 56, women should have a CBE every year. Starting at age 29, women should consider having a mammogram (breast X-ray test) every year. Women who have a family history of breast  cancer should talk to their health care provider about genetic screening. Women at a high risk of breast cancer should talk to their health care providers about having an MRI and a mammogram every year.  Breast cancer gene (BRCA)-related cancer risk assessment is recommended for women who have family members with BRCA-related cancers. BRCA-related cancers include breast, ovarian, tubal, and peritoneal cancers. Having family members with these cancers may  be associated with an increased risk for harmful changes (mutations) in the breast cancer genes BRCA1 and BRCA2. Results of the assessment will determine the need for genetic counseling and BRCA1 and BRCA2 testing.  Routine pelvic exams to screen for cancer are no longer recommended for nonpregnant women who are considered low risk for cancer of the pelvic organs (ovaries, uterus, and vagina) and who do not have symptoms. Ask your health care provider if a screening pelvic exam is right for you.  If you have had past treatment for cervical cancer or a condition that could lead to cancer, you need Pap tests and screening for cancer for at least 20 years after your treatment. If Pap tests have been discontinued, your risk factors (such as having a new sexual partner) need to be reassessed to determine if screening should be resumed. Some women have medical problems that increase the chance of getting cervical cancer. In these cases, your health care provider may recommend more frequent screening and Pap tests.  The HPV test is an additional test that may be used for cervical cancer screening. The HPV test looks for the virus that can cause the cell changes on the cervix. The cells collected during the Pap test can be tested for HPV. The HPV test could be used to screen women aged 39 years and older, and should be used in women of any age who have unclear Pap test results. After the age of 1, women should have HPV testing at the same frequency as a Pap  test.  Colorectal cancer can be detected and often prevented. Most routine colorectal cancer screening begins at the age of 90 years and continues through age 20 years. However, your health care provider may recommend screening at an earlier age if you have risk factors for colon cancer. On a yearly basis, your health care provider may provide home test kits to check for hidden blood in the stool. Use of a small camera at the end of a tube, to directly examine the colon (sigmoidoscopy or colonoscopy), can detect the earliest forms of colorectal cancer. Talk to your health care provider about this at age 38, when routine screening begins. Direct exam of the colon should be repeated every 5-10 years through age 63 years, unless early forms of pre-cancerous polyps or small growths are found.  People who are at an increased risk for hepatitis B should be screened for this virus. You are considered at high risk for hepatitis B if:  You were born in a country where hepatitis B occurs often. Talk with your health care provider about which countries are considered high risk.  Your parents were born in a high-risk country and you have not received a shot to protect against hepatitis B (hepatitis B vaccine).  You have HIV or AIDS.  You use needles to inject street drugs.  You live with, or have sex with, someone who has hepatitis B.  You get hemodialysis treatment.  You take certain medicines for conditions like cancer, organ transplantation, and autoimmune conditions.  Hepatitis C blood testing is recommended for all people born from [birth year] through 1963 and any individual with known risks for hepatitis C.  Practice safe sex. Use condoms and avoid high-risk sexual practices to reduce the spread of sexually transmitted infections (STIs). STIs include gonorrhea, chlamydia, syphilis, trichomonas, herpes, HPV, and human immunodeficiency virus (HIV). Herpes, HIV, and HPV are viral illnesses that have no cure.  They can result in disability, cancer, and death.  You should be screened for sexually transmitted illnesses (STIs) including gonorrhea and chlamydia if:  You are sexually active and are younger than 24 years.  You are older than 24 years and your health care provider tells you that you are at risk for this type of infection.  Your sexual activity has changed since you were last screened and you are at an increased risk for chlamydia or gonorrhea. Ask your health care provider if you are at risk.  If you are at risk of being infected with HIV, it is recommended that you take a prescription medicine daily to prevent HIV infection. This is called preexposure prophylaxis (PrEP). You are considered at risk if:  You are a heterosexual woman, are sexually active, and are at increased risk for HIV infection.  You take drugs by injection.  You are sexually active with a partner who has HIV.  Talk with your health care provider about whether you are at high risk of being infected with HIV. If you choose to begin PrEP, you should first be tested for HIV. You should then be tested every 3 months for as long as you are taking PrEP.  Osteoporosis is a disease in which the bones lose minerals and strength with aging. This can result in serious bone fractures or breaks. The risk of osteoporosis can be identified using a bone density scan. Women ages 26 years and over and women at risk for fractures or osteoporosis should discuss screening with their health care providers. Ask your health care provider whether you should take a calcium supplement or vitamin D to reduce the rate of osteoporosis.  Menopause can be associated with physical symptoms and risks. Hormone replacement therapy is available to decrease symptoms and risks. You should talk to your health care provider about whether hormone replacement therapy is right for you.  Use sunscreen. Apply sunscreen liberally and repeatedly throughout the day.  You should seek shade when your shadow is shorter than you. Protect yourself by wearing long sleeves, pants, a wide-brimmed hat, and sunglasses year round, whenever you are outdoors.  Once a month, do a whole body skin exam, using a mirror to look at the skin on your back. Tell your health care provider of new moles, moles that have irregular borders, moles that are larger than a pencil eraser, or moles that have changed in shape or color.  Stay current with required vaccines (immunizations).  Influenza vaccine. All adults should be immunized every year.  Tetanus, diphtheria, and acellular pertussis (Td, Tdap) vaccine. Pregnant women should receive 1 dose of Tdap vaccine during each pregnancy. The dose should be obtained regardless of the length of time since the last dose. Immunization is preferred during the 27th-36th week of gestation. An adult who has not previously received Tdap or who does not know her vaccine status should receive 1 dose of Tdap. This initial dose should be followed by tetanus and diphtheria toxoids (Td) booster doses every 10 years. Adults with an unknown or incomplete history of completing a 3-dose immunization series with Td-containing vaccines should begin or complete a primary immunization series including a Tdap dose. Adults should receive a Td booster every 10 years.  Varicella vaccine. An adult without evidence of immunity to varicella should receive 2 doses or a second dose if she has previously received 1 dose. Pregnant females who do not have evidence of immunity should receive the first dose after pregnancy. This first dose should be obtained before leaving the health care facility.  The second dose should be obtained 4-8 weeks after the first dose.  Human papillomavirus (HPV) vaccine. Females aged 13-26 years who have not received the vaccine previously should obtain the 3-dose series. The vaccine is not recommended for use in pregnant females. However, pregnancy  testing is not needed before receiving a dose. If a female is found to be pregnant after receiving a dose, no treatment is needed. In that case, the remaining doses should be delayed until after the pregnancy. Immunization is recommended for any person with an immunocompromised condition through the age of 28 years if she did not get any or all doses earlier. During the 3-dose series, the second dose should be obtained 4-8 weeks after the first dose. The third dose should be obtained 24 weeks after the first dose and 16 weeks after the second dose.  Zoster vaccine. One dose is recommended for adults aged 44 years or older unless certain conditions are present.  Measles, mumps, and rubella (MMR) vaccine. Adults born before [birth year] generally are considered immune to measles and mumps. Adults born in [birth year] or later should have 1 or more doses of MMR vaccine unless there is a contraindication to the vaccine or there is laboratory evidence of immunity to each of the three diseases. A routine second dose of MMR vaccine should be obtained at least 28 days after the first dose for students attending postsecondary schools, health care workers, or international travelers. People who received inactivated measles vaccine or an unknown type of measles vaccine during 1963-1967 should receive 2 doses of MMR vaccine. People who received inactivated mumps vaccine or an unknown type of mumps vaccine before 1979 and are at high risk for mumps infection should consider immunization with 2 doses of MMR vaccine. For females of childbearing age, rubella immunity should be determined. If there is no evidence of immunity, females who are not pregnant should be vaccinated. If there is no evidence of immunity, females who are pregnant should delay immunization until after pregnancy. Unvaccinated health care workers born before [birth year] who lack laboratory evidence of measles, mumps, or rubella immunity or laboratory confirmation of disease should  consider measles and mumps immunization with 2 doses of MMR vaccine or rubella immunization with 1 dose of MMR vaccine.  Pneumococcal 13-valent conjugate (PCV13) vaccine. When indicated, a person who is uncertain of her immunization history and has no record of immunization should receive the PCV13 vaccine. An adult aged 47 years or older who has certain medical conditions and has not been previously immunized should receive 1 dose of PCV13 vaccine. This PCV13 should be followed with a dose of pneumococcal polysaccharide (PPSV23) vaccine. The PPSV23 vaccine dose should be obtained at least 8 weeks after the dose of PCV13 vaccine. An adult aged 79 years or older who has certain medical conditions and previously received 1 or more doses of PPSV23 vaccine should receive 1 dose of PCV13. The PCV13 vaccine dose should be obtained 1 or more years after the last PPSV23 vaccine dose.  Pneumococcal polysaccharide (PPSV23) vaccine. When PCV13 is also indicated, PCV13 should be obtained first. All adults aged 17 years and older should be immunized. An adult younger than age 11 years who has certain medical conditions should be immunized. Any person who resides in a nursing home or long-term care facility should be immunized. An adult smoker should be immunized. People with an immunocompromised condition and certain other conditions should receive both PCV13 and PPSV23 vaccines. People with human immunodeficiency virus (HIV) infection should  be immunized as soon as possible after diagnosis. Immunization during chemotherapy or radiation therapy should be avoided. Routine use of PPSV23 vaccine is not recommended for American Indians, [place] Natives, or people younger than 65 years unless there are medical conditions that require PPSV23 vaccine. When indicated, people who have unknown immunization and have no record of immunization should receive PPSV23 vaccine. One-time revaccination 5 years after the first dose of PPSV23 is  recommended for people aged 19-64 years who have chronic kidney failure, nephrotic syndrome, asplenia, or immunocompromised conditions. People who received 1-2 doses of PPSV23 before age 20 years should receive another dose of PPSV23 vaccine at age 51 years or later if at least 5 years have passed since the previous dose. Doses of PPSV23 are not needed for people immunized with PPSV23 at or after age 42 years.  Meningococcal vaccine. Adults with asplenia or persistent complement component deficiencies should receive 2 doses of quadrivalent meningococcal conjugate (MenACWY-D) vaccine. The doses should be obtained at least 2 months apart. Microbiologists working with certain meningococcal bacteria, [employer] recruits, people at risk during an outbreak, and people who travel to or live in countries with a high rate of meningitis should be immunized. A first-year college student up through age 46 years who is living in a residence hall should receive a dose if she did not receive a dose on or after her 16th birthday. Adults who have certain high-risk conditions should receive one or more doses of vaccine.  Hepatitis A vaccine. Adults who wish to be protected from this disease, have certain high-risk conditions, work with hepatitis A-infected animals, work in hepatitis A research labs, or travel to or work in countries with a high rate of hepatitis A should be immunized. Adults who were previously unvaccinated and who anticipate close contact with an international adoptee during the first 60 days after arrival in the [place] from a country with a high rate of hepatitis A should be immunized.  Hepatitis B vaccine. Adults who wish to be protected from this disease, have certain high-risk conditions, may be exposed to blood or other infectious body fluids, are household contacts or sex partners of hepatitis B positive people, are clients or workers in certain care facilities, or travel to or work in countries  with a high rate of hepatitis B should be immunized.  Ages 51 years and over  Blood pressure check.** / Every 1 to 2 years.  Lipid and cholesterol check.** / Every 5 years beginning at age 19 years.  Lung cancer screening. / Every year if you are aged 37-80 years and have a 30-pack-year history of smoking and currently smoke or have quit within the past 15 years. Yearly screening is stopped once you have quit smoking for at least 15 years or develop a health problem that would prevent you from having lung cancer treatment.  Clinical breast exam.** / Every year after age 55 years.  BRCA-related cancer risk assessment.** / For women who have family members with a BRCA-related cancer (breast, ovarian, tubal, or peritoneal cancers).  Mammogram.** / Every year beginning at age 23 years and continuing for as long as you are in good health. Consult with your health care provider.  Pap test.** / Every 3 years starting at age 35 years through age 55 or 24 years with 3 consecutive normal Pap tests. Testing can be stopped between 65 and 70 years with 3 consecutive normal Pap tests and no abnormal Pap or HPV tests in the past 10 years.  HPV screening.** / Every 3 years from ages 16 years through ages 33 or 94 years with a history of 3 consecutive normal Pap tests. Testing can be stopped between 65 and 70 years with 3 consecutive normal Pap tests and no abnormal Pap or HPV tests in the past 10 years.  Fecal occult blood test (FOBT) of stool. / Every year beginning at age 72 years and continuing until age 31 years. You may not need to do this test if you get a colonoscopy every 10 years.  Flexible sigmoidoscopy or colonoscopy.** / Every 5 years for a flexible sigmoidoscopy or every 10 years for a colonoscopy beginning at age 19 years and continuing until age 33 years.  Hepatitis C blood test.** / For all people born from [birth year] through 1963 and any individual with known risks for hepatitis  C.  Osteoporosis screening.** / A one-time screening for women ages 20 years and over and women at risk for fractures or osteoporosis.  Skin self-exam. / Monthly.  Influenza vaccine. / Every year.  Tetanus, diphtheria, and acellular pertussis (Tdap/Td) vaccine.** / 1 dose of Td every 10 years.  Varicella vaccine.** / Consult your health care provider.  Zoster vaccine.** / 1 dose for adults aged 109 years or older.  Pneumococcal 13-valent conjugate (PCV13) vaccine.** / Consult your health care provider.  Pneumococcal polysaccharide (PPSV23) vaccine.** / 1 dose for all adults aged 21 years and older.  Meningococcal vaccine.** / Consult your health care provider.  Hepatitis A vaccine.** / Consult your health care provider.  Hepatitis B vaccine.** / Consult your health care provider.  Haemophilus influenzae type b (Hib) vaccine.** / Consult your health care provider. ** Family history and personal history of risk and conditions may change your health care provider's recommendations. Document Released: 09/17/2001 Document Revised: 12/06/2013 Document Reviewed: 12/17/2010 [facility] Patient Information 2015 [place], [place]. This information is not intended to replace advice given to you by your health care provider. Make sure you discuss any questions you have with your health care provider.

## 2014-04-25 ENCOUNTER — Encounter: Payer: Self-pay | Admitting: Internal Medicine

## 2014-04-25 ENCOUNTER — Ambulatory Visit (INDEPENDENT_AMBULATORY_CARE_PROVIDER_SITE_OTHER): Payer: Medicare PPO | Admitting: Internal Medicine

## 2014-04-25 VITALS — BP 116/72 | HR 92 | Ht 61.5 in | Wt 124.0 lb

## 2014-04-25 DIAGNOSIS — I4891 Unspecified atrial fibrillation: Secondary | ICD-10-CM

## 2014-04-25 DIAGNOSIS — I4819 Other persistent atrial fibrillation: Secondary | ICD-10-CM

## 2014-04-25 DIAGNOSIS — I495 Sick sinus syndrome: Secondary | ICD-10-CM

## 2014-04-25 NOTE — Progress Notes (Signed)
PCP:  [name], MD  The patient presents today for routine electrophysiology followup.  She appears to be doing quite well with rate control alone.  She is unable to tell that she is in afib most of the time.  She does have rare palpitations.  Her energy is preserved. She says that she went line dancing this weekend and danced for over an hour.  She continues to exercise several times per week without limitation. Today, she denies symptoms of chest pain, orthopnea, PND, lower extremity edema, dizziness, presyncope, syncope, or neurologic sequela.  The patient feels that she is tolerating medications without difficulties and is otherwise without complaint today.   Past Medical History  Diagnosis Date  . Hyperlipidemia   . Microcytic anemia   . Osteopenia   . 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 Persistent atrial fibrillation     s/p PVI 04/2010  . Sinus bradycardia   . CVA (cerebral vascular accident) 2007  . Arthritis   . Dizziness   . Breast mass, right   . Cataract   . RLS (restless legs syndrome)    Past Surgical History  Procedure Laterality Date  . Breast mass resected    . Afib ablation  04/2010    PVI with CTI ablation performed by [facility]  . Cardioversion      for afib  . Cataract extraction w/phaco  11/11/2011    Procedure: CATARACT EXTRACTION PHACO AND INTRAOCULAR LENS PLACEMENT (IOC);  Surgeon: Tonny Branch, MD;  Location: [facility];  Service: Ophthalmology;  Laterality: Left;  CDE:12.62  . Cataract extraction w/phaco  11/25/2011    Procedure: CATARACT EXTRACTION PHACO AND INTRAOCULAR LENS PLACEMENT (IOC);  Surgeon: Tonny Branch, MD;  Location: [facility];  Service: Ophthalmology;  Laterality: Right;  CDE 16.14  . Dental inplants      Current Outpatient Prescriptions  Medication Sig Dispense Refill  . atorvastatin (LIPITOR) 10 MG tablet Take 5 mg by mouth daily.       . Calcium Carbonate-Vit D-Min 1200-1000 MG-UNIT CHEW Chew 1 tablet by mouth daily.      .  chlorhexidine (PERIDEX) 0.12 % solution Mouthwash - Use as dierected      . Cholecalciferol (VITAMIN D3) 1000 UNITS CAPS Take 1 capsule by mouth daily.      Marland Kitchen diltiazem (CARDIZEM CD) 180 MG 24 hr capsule Take 1 capsule (180 mg total) by mouth daily.  90 capsule  3  . ipratropium (ATROVENT) 0.06 % nasal spray PLACE 1 SPRAY INTO EACH NOSTRIL TWICE DAILY AS NEEDED FOR ALLERGIES      . Magnesium 500 MG TABS Take 1 tablet by mouth daily.      . Multiple Vitamin (MULTIVITAMIN) capsule Take 1 capsule by mouth daily.        . Rivaroxaban (XARELTO) 15 MG TABS tablet Take 1 tablet (15 mg total) by mouth daily.  30 tablet  11  . COCONUT OIL PO Take 1 capsule by mouth daily.       . [DISCONTINUED] Calcium Carbonate (CALCIUM 500 PO) Take 1 tablet by mouth daily.         No current facility-administered medications for this visit.   ROS- all systems are reviewed and negative except as per HPI above  No Known Allergies  History   Social History  . Marital Status: Widowed    Spouse Name: N/A    Number of Children: 0  . Years of Education: college   Occupational  History  . retired    Social History Main Topics  . Smoking status: Former Smoker -- 1.00 packs/day for 30 years    Types: Cigarettes  . Smokeless tobacco: Never Used     Comment: quit in 1980s  . Alcohol Use: No  . Drug Use: No  . Sexual Activity: No   Other Topics Concern  . Not on file   Social History Narrative   Patient is retired and lives at home alone. Patient has business college education.    Right handed.   Caffeine- some times - soda     Family History  Problem Relation Age of Onset  . Stroke Mother 109  . Hip fracture Mother   . Anesthesia problems Neg Hx   . Hypotension Neg Hx   . Malignant hyperthermia Neg Hx   . Pseudochol deficiency Neg Hx   . Lung cancer Father 40    ROS-  All systems are reviewed and are negative except as outlined in the HPI above  Physical Exam: Filed Vitals:   04/25/14 1213    BP: 116/72  Pulse: 92    GEN- The patient is well appearing, alert and oriented x 3 today.   Head- normocephalic, atraumatic Eyes-  Sclera clear, conjunctiva pink Ears- hearing intact Oropharynx- clear Neck- supple, no JVP  Lungs- Clear to ausculation bilaterally, normal work of breathing Heart-irrr GI- soft, NT, ND, + BS Extremities- no clubbing, cyanosis, or edema Neuro- strength and sensation are intact  ekg today reveals afib, V rate 92 bpm  Assessment and Plan:  1. afib Doing well with rate control  If her afib symptoms progress then our options would be tikosyn (prefered), return to amiodarone, or repeat ablation.  Given her advanced age and paucity of symptoms, I would prefer rate control at this time. Continue anticoagulation with xarelto.   2. Symptomatic sinus bradycardia V rates are presently stable in afib  Return in 6 months to the afib clinic

## 2014-04-25 NOTE — Patient Instructions (Signed)
Your physician wants you to follow-up in: 6 months with Donna Carroll, NP. You will receive a reminder letter in the mail two months in advance. If you don't receive a letter, please call our office to schedule the follow-up appointment.  

## 2014-05-09 ENCOUNTER — Encounter (HOSPITAL_COMMUNITY): Payer: Medicare PPO | Admitting: Nurse Practitioner

## 2014-05-13 ENCOUNTER — Encounter: Payer: Self-pay | Admitting: Internal Medicine

## 2014-05-16 ENCOUNTER — Encounter: Payer: Self-pay | Admitting: *Deleted

## 2014-05-19 ENCOUNTER — Ambulatory Visit (INDEPENDENT_AMBULATORY_CARE_PROVIDER_SITE_OTHER): Payer: Medicare PPO | Admitting: Family Medicine

## 2014-05-19 ENCOUNTER — Ambulatory Visit (INDEPENDENT_AMBULATORY_CARE_PROVIDER_SITE_OTHER): Payer: Medicare PPO

## 2014-05-19 ENCOUNTER — Encounter: Payer: Self-pay | Admitting: Family Medicine

## 2014-05-19 ENCOUNTER — Telehealth: Payer: Self-pay

## 2014-05-19 VITALS — BP 99/69 | HR 83 | Temp 98.1°F | Ht 61.5 in | Wt 125.0 lb

## 2014-05-19 DIAGNOSIS — M79675 Pain in left toe(s): Secondary | ICD-10-CM

## 2014-05-19 DIAGNOSIS — Z79899 Other long term (current) drug therapy: Secondary | ICD-10-CM

## 2014-05-19 DIAGNOSIS — I4819 Other persistent atrial fibrillation: Secondary | ICD-10-CM

## 2014-05-19 DIAGNOSIS — Z23 Encounter for immunization: Secondary | ICD-10-CM

## 2014-05-19 DIAGNOSIS — I481 Persistent atrial fibrillation: Secondary | ICD-10-CM

## 2014-05-19 DIAGNOSIS — E785 Hyperlipidemia, unspecified: Secondary | ICD-10-CM

## 2014-05-19 DIAGNOSIS — E559 Vitamin D deficiency, unspecified: Secondary | ICD-10-CM

## 2014-05-19 DIAGNOSIS — L84 Corns and callosities: Secondary | ICD-10-CM

## 2014-05-19 NOTE — Telephone Encounter (Signed)
[facility] to X-ray 

## 2014-05-19 NOTE — Progress Notes (Signed)
Subjective:    Patient ID: [name], female    DOB: 03/09/1931, 80 y.o.   MRN: [id number]  HPI Pt here for follow up and management of chronic medical problems. The patient does complain of pain in three toes of the left foot. She is also due to get her flu shot and will return to the office for fasting blood work. She will also be given an FOBT to return. She is due for pelvic exam and mammogram. The patient presents herself well and is alert and doing well. She has seen the cardiologist recently for her atrial fibrillation and was started on diltiazem and we'll see him again in 6 months.         Patient Active Problem List   Diagnosis Date Noted  . High risk medication use 06/21/2013  . Dizziness   . Restless leg syndrome 03/01/2013  . ATRIAL FIBRILLATION 11/03/2008  . Hyperlipidemia 10/24/2008  . ANEMIA, IRON DEFICIENCY, MICROCYTIC 10/24/2008  . SINUS BRADYCARDIA 10/24/2008  . CVA 10/24/2008  . OSTEOPENIA 10/24/2008   Outpatient Encounter Prescriptions as of 05/19/2014  Medication Sig  . atorvastatin (LIPITOR) 10 MG tablet Take 5 mg by mouth daily.   . Calcium Carbonate-Vit D-Min 1200-1000 MG-UNIT CHEW Chew 1 tablet by mouth daily.  . chlorhexidine (PERIDEX) 0.12 % solution Mouthwash - Use as dierected  . Cholecalciferol (VITAMIN D3) 1000 UNITS CAPS Take 1 capsule by mouth daily.  Marland Kitchen diltiazem (CARDIZEM CD) 180 MG 24 hr capsule Take 1 capsule (180 mg total) by mouth daily.  Marland Kitchen ipratropium (ATROVENT) 0.06 % nasal spray PLACE 1 SPRAY INTO EACH NOSTRIL TWICE DAILY AS NEEDED FOR ALLERGIES  . Magnesium 500 MG TABS Take 1 tablet by mouth daily.  . Multiple Vitamin (MULTIVITAMIN) capsule Take 1 capsule by mouth daily.    . Rivaroxaban (XARELTO) 15 MG TABS tablet Take 1 tablet (15 mg total) by mouth daily.  . [DISCONTINUED] COCONUT OIL PO Take 1 capsule by mouth daily.     Review of Systems  Constitutional: Negative.   HENT: Negative.   Eyes: Negative.     Respiratory: Negative.   Cardiovascular: Negative.   Gastrointestinal: Negative.   Endocrine: Negative.   Genitourinary: Negative.   Musculoskeletal: Positive for arthralgias (left foot - toe soreness).  Skin: Negative.   Allergic/Immunologic: Negative.   Neurological: Negative.   Hematological: Negative.   Psychiatric/Behavioral: Negative.        Objective:   Physical Exam  Nursing note and vitals reviewed. Constitutional: She is oriented to person, place, and time. She appears well-developed and well-nourished. No distress.  Bilateral impacted ear cerumen  HENT:  Head: Normocephalic and atraumatic.  Mouth/Throat: Oropharynx is clear and moist. No oropharyngeal exudate.  Bilateral impacted ear cerumen and minimal nasal congestion  Eyes: Conjunctivae and EOM are normal. Pupils are equal, round, and reactive to light. Right eye exhibits no discharge. Left eye exhibits no discharge. No scleral icterus.  Neck: Normal range of motion. Neck supple. No thyromegaly present.  There were no carotid bruits  Cardiovascular: Normal rate, normal heart sounds and intact distal pulses.  Exam reveals no gallop and no friction rub.   No murmur heard. The heart is slightly irregular at 72 per min  Pulmonary/Chest: Effort normal and breath sounds normal. No respiratory distress. She has no wheezes. She has no rales. She exhibits no tenderness.  There is no axillary adenopathy  Abdominal: Soft. Bowel sounds are normal. She exhibits no mass. There is no tenderness. There is  no rebound and no guarding.  No abdominal bruits  Musculoskeletal: Normal range of motion. She exhibits edema (there is slight edema of the fourth and fifth toes of the left foot.) and tenderness.  There is tenderness and slight swelling of the third fourth and fifth toes of the left foot. There is a corn on the lateral surface of the third toe of the left foot.  Lymphadenopathy:    She has no cervical adenopathy.  Neurological:  She is alert and oriented to person, place, and time. She has normal reflexes. No cranial nerve deficit.  Skin: Skin is warm and dry. No rash noted.  Psychiatric: She has a normal mood and affect. Her behavior is normal. Judgment and thought content normal.   BP 99/69  Pulse 83  Temp(Src) 98.1 F (36.7 C) (Oral)  Ht 5' 1.5" (1.562 m)  Wt 125 lb (56.7 kg)  BMI 23.24 kg/m2  [facility] reading (PRIMARY) by  Dr. Sherilyn Banker of left foot-   severe degenerative changes in the toes of the left foot                                    Assessment & Plan:  1. Persistent atrial fibrillation - POCT CBC; Future - BMP8+EGFR; Future - Hepatic function panel; Future  2. Hyperlipidemia - POCT CBC; Future - Hepatic function panel; Future - NMR, lipoprofile; Future  3. High risk medication use - POCT CBC; Future - BMP8+EGFR; Future - Hepatic function panel; Future - NMR, lipoprofile; Future - Vit D  25 hydroxy (rtn osteoporosis monitoring); Future  4. Vitamin D deficiency- POCT CBC; Future - Vit D  25 hydroxy (rtn osteoporosis monitoring); Future  5. Pain of toe of left foot  - Uric acid; Future - DG Foot Complete Left; Future  6. Corn of toe   Patient Instructions                       Medicare Annual Wellness Visit  [facility] and the medical providers at [facility] strive to bring you the best medical care.  In doing so we not only want to address your current medical conditions and concerns but also to detect new conditions early and prevent illness, disease and health-related problems.    Medicare offers a yearly Wellness Visit which allows our clinical staff to assess your need for preventative services including immunizations, lifestyle education, counseling to decrease risk of preventable diseases and screening for fall risk and other medical concerns.    This visit is provided free of charge (no copay) for all Medicare recipients. The clinical pharmacists  at [facility] have begun to conduct these Wellness Visits which will also include a thorough review of all your medications.    As you primary medical provider recommend that you make an appointment for your Annual Wellness Visit if you have not done so already this year.  You may set up this appointment before you leave today or you may call back ([phone]) and schedule an appointment.  Please make sure when you call that you mention that you are scheduling your Annual Wellness Visit with the clinical pharmacist so that the appointment may be made for the proper length of time.     Continue current medications. Continue good therapeutic lifestyle changes which include good diet and exercise. Fall precautions discussed with patient. If an FOBT was  given today- please return it to our front desk. If you are over 85 years old - you may need Prevnar 47 or the adult Pneumonia vaccine.  Flu Shots will be available at our office starting mid- September. Please call and schedule a FLU CLINIC APPOINTMENT.   You can purchase Debrox, over-the-counter, and use 2-3 drops nightly for 2 or 3 nights and Penrose, weight 1-2 weeks repeat this in a week or so after that come in for ear irrigation Continue to be careful it do not put herself at risk for falling Continue to drink plenty of fluids In the future purchase shoes that have more width Get a corn pad and wear or place this on the lateral surface of the third toe   Arrie Senate MD

## 2014-05-19 NOTE — Patient Instructions (Addendum)
Medicare Annual Wellness Visit  [facility] and the medical providers at [facility] strive to bring you the best medical care.  In doing so we not only want to address your current medical conditions and concerns but also to detect new conditions early and prevent illness, disease and health-related problems.    Medicare offers a yearly Wellness Visit which allows our clinical staff to assess your need for preventative services including immunizations, lifestyle education, counseling to decrease risk of preventable diseases and screening for fall risk and other medical concerns.    This visit is provided free of charge (no copay) for all Medicare recipients. The clinical pharmacists at [facility] have begun to conduct these Wellness Visits which will also include a thorough review of all your medications.    As you primary medical provider recommend that you make an appointment for your Annual Wellness Visit if you have not done so already this year.  You may set up this appointment before you leave today or you may call back [phone]) and schedule an appointment.  Please make sure when you call that you mention that you are scheduling your Annual Wellness Visit with the clinical pharmacist so that the appointment may be made for the proper length of time.     Continue current medications. Continue good therapeutic lifestyle changes which include good diet and exercise. Fall precautions discussed with patient. If an FOBT was given today- please return it to our front desk. If you are over 21 years old - you may need Prevnar 58 or the adult Pneumonia vaccine.  Flu Shots will be available at our office starting mid- September. Please call and schedule a FLU CLINIC APPOINTMENT.   You can purchase Debrox, over-the-counter, and use 2-3 drops nightly for 2 or 3 nights and Penrose, weight 1-2 weeks repeat this in a week or so after  that come in for ear irrigation Continue to be careful it do not put herself at risk for falling Continue to drink plenty of fluids In the future purchase shoes that have more width Get a corn pad and wear or place this on the lateral surface of the third toe

## 2014-05-19 NOTE — Telephone Encounter (Signed)
Message copied by Koren Bound on Thu May 19, 2014  2:27 PM ------      Message from: Chipper Herb      Created: Thu May 19, 2014 11:48 AM       As per radiology report ------

## 2014-05-19 NOTE — Telephone Encounter (Signed)
Pt aware of foot x-ray results 

## 2014-05-27 ENCOUNTER — Other Ambulatory Visit: Payer: Self-pay | Admitting: Internal Medicine

## 2014-06-03 ENCOUNTER — Other Ambulatory Visit: Payer: Self-pay

## 2014-06-03 DIAGNOSIS — Z1231 Encounter for screening mammogram for malignant neoplasm of breast: Secondary | ICD-10-CM

## 2014-06-06 ENCOUNTER — Other Ambulatory Visit (INDEPENDENT_AMBULATORY_CARE_PROVIDER_SITE_OTHER): Payer: Medicare PPO

## 2014-06-06 DIAGNOSIS — M79675 Pain in left toe(s): Secondary | ICD-10-CM

## 2014-06-06 DIAGNOSIS — Z1212 Encounter for screening for malignant neoplasm of rectum: Secondary | ICD-10-CM

## 2014-06-06 DIAGNOSIS — I481 Persistent atrial fibrillation: Secondary | ICD-10-CM

## 2014-06-06 DIAGNOSIS — E785 Hyperlipidemia, unspecified: Secondary | ICD-10-CM

## 2014-06-06 DIAGNOSIS — E559 Vitamin D deficiency, unspecified: Secondary | ICD-10-CM

## 2014-06-06 DIAGNOSIS — Z79899 Other long term (current) drug therapy: Secondary | ICD-10-CM

## 2014-06-06 DIAGNOSIS — I4819 Other persistent atrial fibrillation: Secondary | ICD-10-CM

## 2014-06-06 LAB — POCT CBC
Granulocyte percent: 62.5 %G (ref 37–80)
HCT, POC: 39.6 % (ref 37.7–47.9)
Hemoglobin: 12.7 g/dL (ref 12.2–16.2)
LYMPH, POC: 1.9 (ref 0.6–3.4)
MCH: 29.7 pg (ref 27–31.2)
MCHC: 32 g/dL (ref 31.8–35.4)
MCV: 92.9 fL (ref 80–97)
MPV: 7.6 fL (ref 0–99.8)
PLATELET COUNT, POC: 244 10*3/uL (ref 142–424)
POC Granulocyte: 3.8 (ref 2–6.9)
POC LYMPH PERCENT: 30.4 %L (ref 10–50)
RBC: 4.3 M/uL (ref 4.04–5.48)
RDW, POC: 13.7 %
WBC: 6.1 10*3/uL (ref 4.6–10.2)

## 2014-06-06 NOTE — Progress Notes (Signed)
Lab only 

## 2014-06-07 ENCOUNTER — Telehealth: Payer: Self-pay | Admitting: Family Medicine

## 2014-06-07 LAB — BMP8+EGFR
BUN / CREAT RATIO: 14 (ref 11–26)
BUN: 15 mg/dL (ref 8–27)
CHLORIDE: 103 mmol/L (ref 97–108)
CO2: 24 mmol/L (ref 18–29)
Calcium: 9.4 mg/dL (ref 8.7–10.3)
Creatinine, Ser: 1.07 mg/dL — ABNORMAL HIGH (ref 0.57–1.00)
GFR calc Af Amer: 56 mL/min/{1.73_m2} — ABNORMAL LOW (ref >59)
GFR calc non Af Amer: 49 mL/min/{1.73_m2} — ABNORMAL LOW (ref >59)
Glucose: 89 mg/dL (ref 65–99)
Potassium: 4.8 mmol/L (ref 3.5–5.2)
SODIUM: 141 mmol/L (ref 134–144)

## 2014-06-07 LAB — NMR, LIPOPROFILE
Cholesterol: 149 mg/dL (ref 100–199)
HDL Cholesterol by NMR: 64 mg/dL (ref >39)
HDL PARTICLE NUMBER: 37.4 umol/L (ref >=30.5)
LDL Particle Number: 775 nmol/L (ref <1000)
LDL SIZE: 21 nm (ref >20.5)
LDL-C: 71 mg/dL (ref 0–99)
LP-IR SCORE: 32 (ref <=45)
Small LDL Particle Number: 336 nmol/L (ref <=527)
Triglycerides by NMR: 68 mg/dL (ref 0–149)

## 2014-06-07 LAB — URIC ACID: Uric Acid: 4.8 mg/dL (ref 2.5–7.1)

## 2014-06-07 LAB — HEPATIC FUNCTION PANEL
ALK PHOS: 73 IU/L (ref 39–117)
ALT: 12 IU/L (ref 0–32)
AST: 17 IU/L (ref 0–40)
Albumin: 3.8 g/dL (ref 3.5–4.7)
BILIRUBIN TOTAL: 0.4 mg/dL (ref 0.0–1.2)
Bilirubin, Direct: 0.15 mg/dL (ref 0.00–0.40)
Total Protein: 6.7 g/dL (ref 6.0–8.5)

## 2014-06-07 LAB — FECAL OCCULT BLOOD, IMMUNOCHEMICAL: Fecal Occult Bld: NEGATIVE

## 2014-06-07 LAB — VITAMIN D 25 HYDROXY (VIT D DEFICIENCY, FRACTURES): VIT D 25 HYDROXY: 49.3 ng/mL (ref 30.0–100.0)

## 2014-06-07 NOTE — Telephone Encounter (Signed)
Patient aware.

## 2014-06-08 ENCOUNTER — Encounter: Payer: Self-pay | Admitting: *Deleted

## 2014-06-22 ENCOUNTER — Ambulatory Visit: Payer: Medicare PPO

## 2014-06-28 ENCOUNTER — Ambulatory Visit
Admission: RE | Admit: 2014-06-28 | Discharge: 2014-06-28 | Disposition: A | Discharge status: Home / Self Care | Payer: Medicare PPO | Source: Ambulatory Visit

## 2014-06-28 DIAGNOSIS — Z1231 Encounter for screening mammogram for malignant neoplasm of breast: Secondary | ICD-10-CM

## 2014-08-08 ENCOUNTER — Other Ambulatory Visit: Payer: Self-pay | Admitting: Family Medicine

## 2014-09-30 ENCOUNTER — Ambulatory Visit (INDEPENDENT_AMBULATORY_CARE_PROVIDER_SITE_OTHER): Payer: Medicare PPO | Admitting: Family Medicine

## 2014-09-30 ENCOUNTER — Encounter: Payer: Self-pay | Admitting: Family Medicine

## 2014-09-30 ENCOUNTER — Encounter (INDEPENDENT_AMBULATORY_CARE_PROVIDER_SITE_OTHER): Payer: Self-pay

## 2014-09-30 VITALS — BP 120/77 | HR 83 | Temp 97.1°F | Ht 61.5 in | Wt 127.0 lb

## 2014-09-30 DIAGNOSIS — E785 Hyperlipidemia, unspecified: Secondary | ICD-10-CM

## 2014-09-30 DIAGNOSIS — I481 Persistent atrial fibrillation: Secondary | ICD-10-CM

## 2014-09-30 DIAGNOSIS — E559 Vitamin D deficiency, unspecified: Secondary | ICD-10-CM

## 2014-09-30 DIAGNOSIS — Z79899 Other long term (current) drug therapy: Secondary | ICD-10-CM

## 2014-09-30 DIAGNOSIS — Z23 Encounter for immunization: Secondary | ICD-10-CM

## 2014-09-30 DIAGNOSIS — E079 Disorder of thyroid, unspecified: Secondary | ICD-10-CM

## 2014-09-30 DIAGNOSIS — I4819 Other persistent atrial fibrillation: Secondary | ICD-10-CM

## 2014-09-30 LAB — POCT CBC
Granulocyte percent: 62.8 %G (ref 37–80)
HCT, POC: 42.8 % (ref 37.7–47.9)
Hemoglobin: 12.8 g/dL (ref 12.2–16.2)
Lymph, poc: 2.7 (ref 0.6–3.4)
MCH, POC: 28.3 pg (ref 27–31.2)
MCHC: 29.9 g/dL — AB (ref 31.8–35.4)
MCV: 94.6 fL (ref 80–97)
MPV: 7.9 fL (ref 0–99.8)
POC Granulocyte: 4.9 (ref 2–6.9)
POC LYMPH PERCENT: 34.1 %L (ref 10–50)
Platelet Count, POC: 275 10*3/uL (ref 142–424)
RBC: 4.52 M/uL (ref 4.04–5.48)
RDW, POC: 13.6 %
WBC: 7.8 10*3/uL (ref 4.6–10.2)

## 2014-09-30 NOTE — Patient Instructions (Addendum)
Medicare Annual Wellness Visit  [facility] and the medical providers at [facility] strive to bring you the best medical care.  In doing so we not only want to address your current medical conditions and concerns but also to detect new conditions early and prevent illness, disease and health-related problems.    Medicare offers a yearly Wellness Visit which allows our clinical staff to assess your need for preventative services including immunizations, lifestyle education, counseling to decrease risk of preventable diseases and screening for fall risk and other medical concerns.    This visit is provided free of charge (no copay) for all Medicare recipients. The clinical pharmacists at [facility] have begun to conduct these Wellness Visits which will also include a thorough review of all your medications.    As you primary medical provider recommend that you make an appointment for your Annual Wellness Visit if you have not done so already this year.  You may set up this appointment before you leave today or you may call back ([phone]) and schedule an appointment.  Please make sure when you call that you mention that you are scheduling your Annual Wellness Visit with the clinical pharmacist so that the appointment may be made for the proper length of time.     Continue current medications. Continue good therapeutic lifestyle changes which include good diet and exercise. Fall precautions discussed with patient. If an FOBT was given today- please return it to our front desk. If you are over 3 years old - you may need Prevnar 39 or the adult Pneumonia vaccine.  Flu Shots are still available at our office. If you still haven't had one please call to set up a nurse visit to get one.   After your visit with [place] today you will receive a survey in the mail or online from [employer] regarding your care with [place]. Please take a moment to  fill this out. Your feedback is very important to [place] as you can help [place] better understand your patient needs as well as improve your experience and satisfaction. WE CARE ABOUT YOU!!!   Keep follow-up with cardiology Do not forget to get your pelvic exam in July or August Continue with exercise regimen For now, continue with Lipitor

## 2014-09-30 NOTE — Progress Notes (Signed)
Subjective:    Patient ID: [name], female    DOB: February 04, 1931, 81 y.o.   MRN: [id number]  HPI Pt here for follow up and management of chronic medical problems which includes atrial fibrillation and hyperlipidemia. She is taking medications regularly. The patient is doing well today with minimal complaints. She will get fasting lab work today. She also has questions about the shingles shot. She continues to follow-up with her cardiologist regarding her atrial fibrillation. The patient stays active physically she feels tired in the evening but this is normal for her. She will get lab work done today and she has a follow-up plan with her cardiologist in March. She also is considering getting the shingles shot.         Patient Active Problem List   Diagnosis Date Noted  . High risk medication use 06/21/2013  . Dizziness   . Restless leg syndrome 03/01/2013  . ATRIAL FIBRILLATION 11/03/2008  . Hyperlipidemia 10/24/2008  . ANEMIA, IRON DEFICIENCY, MICROCYTIC 10/24/2008  . SINUS BRADYCARDIA 10/24/2008  . CVA 10/24/2008  . OSTEOPENIA 10/24/2008   Outpatient Encounter Prescriptions as of 09/30/2014  Medication Sig  . atorvastatin (LIPITOR) 10 MG tablet TAKE 1 TABLET BY MOUTH ONCE A DAY OR AS INSTRUCTED BY PHYSICIAN  . Calcium Carbonate-Vit D-Min 1200-1000 MG-UNIT CHEW Chew 1 tablet by mouth daily.  . chlorhexidine (PERIDEX) 0.12 % solution Mouthwash - Use as dierected  . Cholecalciferol (VITAMIN D3) 1000 UNITS CAPS Take 1 capsule by mouth daily.  Marland Kitchen diltiazem (CARDIZEM CD) 180 MG 24 hr capsule Take 1 capsule (180 mg total) by mouth daily.  . Magnesium 500 MG TABS Take 1 tablet by mouth daily.  . Multiple Vitamin (MULTIVITAMIN) capsule Take 1 capsule by mouth daily.    Alveda Reasons 15 MG TABS tablet TAKE 1 TABLET (15 MG TOTAL) BY MOUTH DAILY.  . [DISCONTINUED] ipratropium (ATROVENT) 0.06 % nasal spray PLACE 1 SPRAY INTO EACH NOSTRIL TWICE DAILY AS NEEDED FOR ALLERGIES      Review of Systems  Constitutional: Negative.   HENT: Negative.   Eyes: Negative.   Respiratory: Negative.   Cardiovascular: Negative.   Gastrointestinal: Negative.   Endocrine: Negative.   Genitourinary: Negative.   Musculoskeletal: Negative.   Skin: Negative.   Allergic/Immunologic: Negative.   Neurological: Negative.   Hematological: Negative.   Psychiatric/Behavioral: Negative.        Objective:   Physical Exam  Constitutional: She is oriented to person, place, and time. She appears well-developed and well-nourished. No distress.  The patient is alert and pleasant.  HENT:  Head: Normocephalic and atraumatic.  Right Ear: External ear normal.  Left Ear: External ear normal.  Nose: Nose normal.  Mouth/Throat: Oropharynx is clear and moist.  Eyes: Conjunctivae and EOM are normal. Pupils are equal, round, and reactive to light. Right eye exhibits no discharge. Left eye exhibits no discharge. No scleral icterus.  Neck: Normal range of motion. Neck supple. No thyromegaly present.  No carotid bruits or anterior cervical adenopathy  Cardiovascular: Normal rate, normal heart sounds and intact distal pulses.   No murmur heard. The rhythm is irregular irregular at 72/m  Pulmonary/Chest: Effort normal and breath sounds normal. No respiratory distress. She has no wheezes. She has no rales. She exhibits no tenderness.  Abdominal: Soft. Bowel sounds are normal. She exhibits no mass. There is no tenderness. There is no rebound and no guarding.  Musculoskeletal: Normal range of motion. She exhibits no edema or tenderness.  Lymphadenopathy:  She has no cervical adenopathy.  Neurological: She is alert and oriented to person, place, and time. She has normal reflexes. No cranial nerve deficit.  Skin: Skin is warm and dry. No rash noted.  Psychiatric: She has a normal mood and affect. Her behavior is normal. Judgment and thought content normal.  Nursing note and vitals reviewed.  BP  120/77 mmHg  Pulse 83  Temp(Src) 97.1 F (36.2 C) (Oral)  Ht 5' 1.5" (1.562 m)  Wt 127 lb (57.607 kg)  BMI 23.61 kg/m2        Assessment & Plan:  1. Hyperlipidemia -You know the patient is on a low dose of atorvastatin, she should continue this dose until further lab work is returned - POCT CBC - NMR, lipoprofile  2. Persistent atrial fibrillation -Continue follow-up with cardiology and continued Xarelto - POCT CBC - BMP8+EGFR - Hepatic function panel  3. High risk medication use -Because of atrial fibrillation she should continue with her Xarelto per direction of cardiology - POCT CBC - BMP8+EGFR - Hepatic function panel  4. Vitamin D deficiency -Continue with vitamin D dose and any changes will be made once lab work is returned - POCT CBC - Vit D  25 hydroxy (rtn osteoporosis monitoring)  5. Thyroid disease -Because of your fatigue we'll make sure that your thyroid tests are normal. - POCT CBC  No orders of the defined types were placed in this encounter.   Patient Instructions                       Medicare Annual Wellness Visit  [facility] and the medical providers at [facility] strive to bring you the best medical care.  In doing so we not only want to address your current medical conditions and concerns but also to detect new conditions early and prevent illness, disease and health-related problems.    Medicare offers a yearly Wellness Visit which allows our clinical staff to assess your need for preventative services including immunizations, lifestyle education, counseling to decrease risk of preventable diseases and screening for fall risk and other medical concerns.    This visit is provided free of charge (no copay) for all Medicare recipients. The clinical pharmacists at [facility] have begun to conduct these Wellness Visits which will also include a thorough review of all your medications.    As you primary  medical provider recommend that you make an appointment for your Annual Wellness Visit if you have not done so already this year.  You may set up this appointment before you leave today or you may call back ([phone]) and schedule an appointment.  Please make sure when you call that you mention that you are scheduling your Annual Wellness Visit with the clinical pharmacist so that the appointment may be made for the proper length of time.     Continue current medications. Continue good therapeutic lifestyle changes which include good diet and exercise. Fall precautions discussed with patient. If an FOBT was given today- please return it to our front desk. If you are over 85 years old - you may need Prevnar 91 or the adult Pneumonia vaccine.  Flu Shots are still available at our office. If you still haven't had one please call to set up a nurse visit to get one.   After your visit with [place] today you will receive a survey in the mail or online from [employer] regarding your care with [place].  Please take a moment to fill this out. Your feedback is very important to [place] as you can help [place] better understand your patient needs as well as improve your experience and satisfaction. WE CARE ABOUT YOU!!!   Keep follow-up with cardiology Do not forget to get your pelvic exam in July or August Continue with exercise regimen For now, continue with Lipitor   Arrie Senate MD

## 2014-10-01 LAB — BMP8+EGFR
BUN/Creatinine Ratio: 14 (ref 11–26)
BUN: 14 mg/dL (ref 8–27)
CALCIUM: 8.4 mg/dL — AB (ref 8.7–10.3)
CO2: 23 mmol/L (ref 18–29)
Chloride: 102 mmol/L (ref 97–108)
Creatinine, Ser: 0.98 mg/dL (ref 0.57–1.00)
GFR calc Af Amer: 63 mL/min/{1.73_m2} (ref >59)
GFR calc non Af Amer: 54 mL/min/{1.73_m2} — ABNORMAL LOW (ref >59)
Glucose: 85 mg/dL (ref 65–99)
Potassium: 4.3 mmol/L (ref 3.5–5.2)
SODIUM: 141 mmol/L (ref 134–144)

## 2014-10-01 LAB — NMR, LIPOPROFILE
Cholesterol: 159 mg/dL (ref 100–199)
HDL CHOLESTEROL BY NMR: 69 mg/dL (ref >39)
HDL PARTICLE NUMBER: 37.6 umol/L (ref >=30.5)
LDL Particle Number: 736 nmol/L (ref <1000)
LDL Size: 21.2 nm (ref >20.5)
LDL-C: 72 mg/dL (ref 0–99)
Small LDL Particle Number: 163 nmol/L (ref <=527)
Triglycerides by NMR: 88 mg/dL (ref 0–149)

## 2014-10-01 LAB — HEPATIC FUNCTION PANEL
ALBUMIN: 4.2 g/dL (ref 3.5–4.7)
ALK PHOS: 77 IU/L (ref 39–117)
ALT: 11 IU/L (ref 0–32)
AST: 22 IU/L (ref 0–40)
Bilirubin Total: 0.4 mg/dL (ref 0.0–1.2)
Bilirubin, Direct: 0.12 mg/dL (ref 0.00–0.40)
TOTAL PROTEIN: 6.9 g/dL (ref 6.0–8.5)

## 2014-10-01 LAB — VITAMIN D 25 HYDROXY (VIT D DEFICIENCY, FRACTURES): Vit D, 25-Hydroxy: 40.8 ng/mL (ref 30.0–100.0)

## 2014-10-12 ENCOUNTER — Ambulatory Visit (INDEPENDENT_AMBULATORY_CARE_PROVIDER_SITE_OTHER): Payer: Medicare PPO | Admitting: Nurse Practitioner

## 2014-10-12 ENCOUNTER — Encounter: Payer: Self-pay | Admitting: Nurse Practitioner

## 2014-10-12 VITALS — BP 148/86 | HR 106 | Ht 62.5 in | Wt 130.4 lb

## 2014-10-12 DIAGNOSIS — I481 Persistent atrial fibrillation: Secondary | ICD-10-CM

## 2014-10-12 DIAGNOSIS — I4819 Other persistent atrial fibrillation: Secondary | ICD-10-CM

## 2014-10-12 NOTE — Progress Notes (Signed)
PCP:  [name], MD  The patient presents today for routine electrophysiology followup.  She appears to be doing quite well with rate control alone.  She is unable to tell that she is in afib most of the time.  Her energy is preserved. She continues to line dance one hour three times a week and  goes to the gym 3x a week. All activity can be without shortness of breath or fatigue. Tolerating blood thinner without bleeding issues.  Today, she denies symptoms of chest pain, orthopnea, PND, lower extremity edema, dizziness, presyncope, syncope, or neurologic sequela.  The patient feels that she is tolerating medications without difficulties and is otherwise without complaint today.   Past Medical History  Diagnosis Date  . Hyperlipidemia   . Microcytic anemia   . Osteopenia   . 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 Persistent atrial fibrillation     s/p PVI 04/2010  . Sinus bradycardia   . CVA (cerebral vascular accident) 2007  . Arthritis   . Dizziness   . Breast mass, right   . Cataract   . RLS (restless legs syndrome)    Past Surgical History  Procedure Laterality Date  . Breast mass resected    . Afib ablation  04/2010    PVI with CTI ablation performed by [facility]  . Cardioversion      for afib  . Cataract extraction w/phaco  11/11/2011    Procedure: CATARACT EXTRACTION PHACO AND INTRAOCULAR LENS PLACEMENT (IOC);  Surgeon: Tonny Branch, MD;  Location: [facility];  Service: Ophthalmology;  Laterality: Left;  CDE:12.62  . Cataract extraction w/phaco  11/25/2011    Procedure: CATARACT EXTRACTION PHACO AND INTRAOCULAR LENS PLACEMENT (IOC);  Surgeon: Tonny Branch, MD;  Location: [facility];  Service: Ophthalmology;  Laterality: Right;  CDE 16.14  . Dental inplants      Current Outpatient Prescriptions  Medication Sig Dispense Refill  . atorvastatin (LIPITOR) 10 MG tablet TAKE 1 TABLET BY MOUTH ONCE A DAY OR AS INSTRUCTED BY PHYSICIAN (Patient taking differently: 0.5 tablet  (5mg ) once daily by mouth) 30 tablet 4  . Calcium Carbonate-Vit D-Min 1200-1000 MG-UNIT CHEW Chew 1 tablet by mouth daily.    . chlorhexidine (PERIDEX) 0.12 % solution Mouthwash - Use as dierected    . Cholecalciferol (VITAMIN D3) 1000 UNITS CAPS Take 1 capsule by mouth daily.    Marland Kitchen diltiazem (CARDIZEM CD) 180 MG 24 hr capsule Take 1 capsule (180 mg total) by mouth daily. 90 capsule 3  . folic acid (FOLVITE) 1 MG tablet Take 1 mg by mouth daily.    . Magnesium 500 MG TABS Take 1 tablet by mouth daily.    . Multiple Vitamin (MULTIVITAMIN) capsule Take 1 capsule by mouth daily.      Alveda Reasons 15 MG TABS tablet TAKE 1 TABLET (15 MG TOTAL) BY MOUTH DAILY. 30 tablet 5   No current facility-administered medications for this visit.   ROS- all systems are reviewed and negative except as per HPI above  No Known Allergies  History   Social History  . Marital Status: Widowed    Spouse Name: N/A  . Number of Children: 0  . Years of Education: college   Occupational History  . retired    Social History Main Topics  . Smoking status: Former Smoker -- 1.00 packs/day for 30 years    Types: Cigarettes  . Smokeless tobacco: Never Used     Comment: quit  in 99s  . Alcohol Use: No  . Drug Use: No  . Sexual Activity: No   Other Topics Concern  . Not on file   Social History Narrative   Patient is retired and lives at home alone. Patient has business college education.    Right handed.   Caffeine- some times - soda     Family History  Problem Relation Age of Onset  . Stroke Mother 10  . Hip fracture Mother   . Anesthesia problems Neg Hx   . Hypotension Neg Hx   . Malignant hyperthermia Neg Hx   . Pseudochol deficiency Neg Hx   . Lung cancer Father 72    ROS-  All systems are reviewed and are negative except as outlined in the HPI above  Physical Exam: Filed Vitals:   10/12/14 1103  BP: 148/86  Pulse: 106    GEN- The patient is well appearing, alert and oriented x 3 today.     Head- normocephalic, atraumatic Eyes-  Sclera clear, conjunctiva pink Ears- hearing intact Oropharynx- clear Neck- supple, no JVP  Lungs- Clear to ausculation bilaterally, normal work of breathing Heart-irrr GI- soft, NT, ND, + BS Extremities- no clubbing, cyanosis, or edema Neuro- strength and sensation are intact  ekg today reveals afib, V rate 108 bpm, HR rechecked by me 88. Hear rate at home usually in th 80's.  Assessment and Plan:  1. afib Doing well with rate control  If her afib symptoms progress then our options would be tikosyn (prefered), return to amiodarone, or repeat ablation.  Given her advanced age and paucity of symptoms, I would prefer rate control at this time. Continue anticoagulation with xarelto.   2. Symptomatic sinus bradycardia V rates are presently stable in afib  Return to Dr. Rayann Heman in 6 months.

## 2014-10-12 NOTE — Patient Instructions (Signed)
Your physician wants you to follow-up in: 6 months with Dr. Allred. You will receive a reminder letter in the mail two months in advance. If you don't receive a letter, please call our office to schedule the follow-up appointment.  

## 2014-10-17 ENCOUNTER — Ambulatory Visit: Payer: Medicare PPO | Admitting: Nurse Practitioner

## 2014-11-07 ENCOUNTER — Other Ambulatory Visit: Payer: Self-pay | Admitting: Family Medicine

## 2014-11-08 ENCOUNTER — Ambulatory Visit (HOSPITAL_COMMUNITY): Payer: Medicare PPO | Admitting: Nurse Practitioner

## 2014-12-19 ENCOUNTER — Other Ambulatory Visit: Payer: Self-pay | Admitting: Internal Medicine

## 2015-01-28 ENCOUNTER — Other Ambulatory Visit: Payer: Self-pay | Admitting: Internal Medicine

## 2015-01-31 NOTE — Telephone Encounter (Signed)
Per note 3.9.16

## 2015-02-02 ENCOUNTER — Other Ambulatory Visit: Payer: Self-pay | Admitting: Internal Medicine

## 2015-02-07 ENCOUNTER — Encounter: Payer: Self-pay | Admitting: Family Medicine

## 2015-02-07 ENCOUNTER — Ambulatory Visit (INDEPENDENT_AMBULATORY_CARE_PROVIDER_SITE_OTHER): Payer: Medicare PPO

## 2015-02-07 ENCOUNTER — Ambulatory Visit (INDEPENDENT_AMBULATORY_CARE_PROVIDER_SITE_OTHER): Payer: Medicare PPO | Admitting: Family Medicine

## 2015-02-07 VITALS — BP 106/73 | HR 51 | Temp 97.0°F | Ht 62.5 in | Wt 126.0 lb

## 2015-02-07 DIAGNOSIS — I4819 Other persistent atrial fibrillation: Secondary | ICD-10-CM

## 2015-02-07 DIAGNOSIS — I481 Persistent atrial fibrillation: Secondary | ICD-10-CM

## 2015-02-07 DIAGNOSIS — M150 Primary generalized (osteo)arthritis: Secondary | ICD-10-CM

## 2015-02-07 DIAGNOSIS — E079 Disorder of thyroid, unspecified: Secondary | ICD-10-CM

## 2015-02-07 DIAGNOSIS — M2550 Pain in unspecified joint: Secondary | ICD-10-CM | POA: Diagnosis not present

## 2015-02-07 DIAGNOSIS — E785 Hyperlipidemia, unspecified: Secondary | ICD-10-CM

## 2015-02-07 DIAGNOSIS — E559 Vitamin D deficiency, unspecified: Secondary | ICD-10-CM | POA: Diagnosis not present

## 2015-02-07 DIAGNOSIS — Z0000 Encounter for general adult medical examination without abnormal findings: Secondary | ICD-10-CM

## 2015-02-07 DIAGNOSIS — M159 Polyosteoarthritis, unspecified: Secondary | ICD-10-CM | POA: Insufficient documentation

## 2015-02-07 LAB — POCT CBC
Granulocyte percent: 59.3 %G (ref 37–80)
HCT, POC: 40.3 % (ref 37.7–47.9)
Hemoglobin: 12.9 g/dL (ref 12.2–16.2)
LYMPH, POC: 2.8 (ref 0.6–3.4)
MCH: 29.5 pg (ref 27–31.2)
MCHC: 32 g/dL (ref 31.8–35.4)
MCV: 92 fL (ref 80–97)
MPV: 7.7 fL (ref 0–99.8)
PLATELET COUNT, POC: 243 10*3/uL (ref 142–424)
POC Granulocyte: 4.9 (ref 2–6.9)
POC LYMPH PERCENT: 34.1 %L (ref 10–50)
RBC: 4.38 M/uL (ref 4.04–5.48)
RDW, POC: 13.4 %
WBC: 8.2 10*3/uL (ref 4.6–10.2)

## 2015-02-07 NOTE — Patient Instructions (Addendum)
Medicare Annual Wellness Visit  [facility] and the medical providers at [facility] strive to bring you the best medical care.  In doing so we not only want to address your current medical conditions and concerns but also to detect new conditions early and prevent illness, disease and health-related problems.    Medicare offers a yearly Wellness Visit which allows our clinical staff to assess your need for preventative services including immunizations, lifestyle education, counseling to decrease risk of preventable diseases and screening for fall risk and other medical concerns.    This visit is provided free of charge (no copay) for all Medicare recipients. The clinical pharmacists at [facility] have begun to conduct these Wellness Visits which will also include a thorough review of all your medications.    As you primary medical provider recommend that you make an appointment for your Annual Wellness Visit if you have not done so already this year.  You may set up this appointment before you leave today or you may call back ([phone]) and schedule an appointment.  Please make sure when you call that you mention that you are scheduling your Annual Wellness Visit with the clinical pharmacist so that the appointment may be made for the proper length of time.     Continue current medications. Continue good therapeutic lifestyle changes which include good diet and exercise. Fall precautions discussed with patient. If an FOBT was given today- please return it to our front desk. If you are over 29 years old - you may need Prevnar 69 or the adult Pneumonia vaccine.  Flu Shots are still available at our office. If you still haven't had one please call to set up a nurse visit to get one.   After your visit with [place] today you will receive a survey in the mail or online from [employer] regarding your care with [place]. Please take a moment to  fill this out. Your feedback is very important to [place] as you can help [place] better understand your patient needs as well as improve your experience and satisfaction. WE CARE ABOUT YOU!!!   Continue regular follow-up with cardiology Stay active and always do not put yourself at risk for falling Drink plenty of fluids especially this summer to stay well hydrated

## 2015-02-07 NOTE — Progress Notes (Signed)
Subjective:    Patient ID: [name], female    DOB: 01-Jul-1931, 81 y.o.   MRN: [id number]  HPI Pt here for follow up and management of chronic medical problems which includes hyperlipidemia, Thyroid disease and atrial fibrillation. She is taking medications regularly. The patient today does complain of some back pain and arthralgias in both hands. She is due to get a chest x-ray and routine lab work today. She is also due due to get her pelvic exam this month. The patient sees a cardiologist regularly every 6 months regarding her atrial fibrillation. She denies chest pain or shortness of breath, any problems with her GI tract or urinary tract. She does complain of joint pain and low back pain especially with arising from sitting and some pain in her distal fingers where she has swelling.    Patient Active Problem List   Diagnosis Date Noted  . High risk medication use 06/21/2013  . Dizziness   . Restless leg syndrome 03/01/2013  . ATRIAL FIBRILLATION 11/03/2008  . Hyperlipidemia 10/24/2008  . ANEMIA, IRON DEFICIENCY, MICROCYTIC 10/24/2008  . SINUS BRADYCARDIA 10/24/2008  . CVA 10/24/2008  . OSTEOPENIA 10/24/2008   Outpatient Encounter Prescriptions as of 02/07/2015  Medication Sig  . atorvastatin (LIPITOR) 10 MG tablet TAKE 1 TABLET BY MOUTH ONCE A DAY OR AS INSTRUCTED BY PHYSICIAN (Patient taking differently: 0.5 tablet (22m) once daily by mouth)  . Calcium Carbonate-Vit D-Min 1200-1000 MG-UNIT CHEW Chew 1 tablet by mouth daily.  . chlorhexidine (PERIDEX) 0.12 % solution Mouthwash - Use as dierected  . Cholecalciferol (VITAMIN D3) 1000 UNITS CAPS Take 1 capsule by mouth daily.  .Marland Kitchendiltiazem (CARDIZEM CD) 180 MG 24 hr capsule TAKE 1 CAPSULE (180 MG TOTAL) BY MOUTH DAILY.  . folic acid (FOLVITE) 1 MG tablet Take 1 mg by mouth daily.  .Marland Kitchenipratropium (ATROVENT) 0.06 % nasal spray PLACE 1 SPRAY INTO EACH NOSTRIL TWICE A DAY AS NEEDED  . Magnesium 500 MG TABS Take 1 tablet by  mouth daily.  . Multiple Vitamin (MULTIVITAMIN) capsule Take 1 capsule by mouth daily.    .Alveda Reasons15 MG TABS tablet TAKE 1 TABLET (15 MG TOTAL) BY MOUTH DAILY.   No facility-administered encounter medications on file as of 02/07/2015.     Review of Systems  Constitutional: Negative.   HENT: Negative.   Eyes: Negative.   Respiratory: Negative.   Cardiovascular: Negative.   Gastrointestinal: Negative.   Endocrine: Negative.   Genitourinary: Negative.   Musculoskeletal: Positive for back pain (some discomfort) and arthralgias (bilateral hands).  Skin: Negative.   Allergic/Immunologic: Negative.   Neurological: Negative.   Hematological: Negative.   Psychiatric/Behavioral: Negative.        Objective:   Physical Exam  Constitutional: She is oriented to person, place, and time. She appears well-developed and well-nourished. No distress.  Pleasant and alert and in good spirits and much younger appearing than her stated age  HENT:  Head: Normocephalic and atraumatic.  Right Ear: External ear normal.  Left Ear: External ear normal.  Nose: Nose normal.  Mouth/Throat: Oropharynx is clear and moist.  Eyes: Conjunctivae and EOM are normal. Pupils are equal, round, and reactive to light. Right eye exhibits no discharge. Left eye exhibits no discharge. No scleral icterus.  Neck: Normal range of motion. Neck supple. No thyromegaly present.  No carotid bruits  Cardiovascular: Normal rate, normal heart sounds and intact distal pulses.   No murmur heard. The heart is irregular irregular at 72/m  Pulmonary/Chest: Effort normal and breath sounds normal. No respiratory distress. She has no wheezes. She has no rales. She exhibits no tenderness.  No axillary adenopathy Clear anteriorly and posteriorly without wheezes  Abdominal: Soft. Bowel sounds are normal. She exhibits no mass. There is no tenderness. There is no rebound and no guarding.  No inguinal adenopathy  Musculoskeletal: Normal range  of motion. She exhibits no edema or tenderness.  The patient has DIP joint swelling and the fingers of both hands.  Lymphadenopathy:    She has no cervical adenopathy.  Neurological: She is alert and oriented to person, place, and time. She has normal reflexes. No cranial nerve deficit.  Skin: Skin is warm and dry. No rash noted.  Psychiatric: She has a normal mood and affect. Her behavior is normal. Judgment and thought content normal.  The patient is alert and has a positive affect  Nursing note and vitals reviewed.  BP 106/73 mmHg  Pulse 51  Temp(Src) 97 F (36.1 C) (Oral)  Ht 5' 2.5" (1.588 m)  Wt 126 lb (57.153 kg)  BMI 22.66 kg/m2        Assessment & Plan:  1. Hyperlipidemia -Continue atorvastatin pending results of lab work - [facility]; Future - POCT CBC - BMP8+EGFR - Hepatic function panel - NMR, lipoprofile  2. Persistent atrial fibrillation -Follow-up with cardiology as planned and continue with current treatment and blood thinner - DG Chest 2 View; Future - POCT CBC  3. Vitamin D deficiency -Continue current treatment pending results of lab work - POCT CBC - Vit D  25 hydroxy (rtn osteoporosis monitoring)  4. Thyroid disease - DG Chest 2 View; Future - POCT CBC - Thyroid Panel With TSH  5. Health care maintenance -Do not forget to get your pelvic exam this month - DG Chest 2 View; Future - POCT CBC - BMP8+EGFR - Hepatic function panel  6. Arthralgia -Continue with Tylenol as needed - Sedimentation rate - Uric acid  7. Primary osteoarthritis involving multiple joints -Tylenol as needed and stay active  Patient Instructions                       Medicare Annual Wellness Visit  [facility] and the medical providers at [facility] strive to bring you the best medical care.  In doing so we not only want to address your current medical conditions and concerns but also to detect new conditions early and prevent illness,  disease and health-related problems.    Medicare offers a yearly Wellness Visit which allows our clinical staff to assess your need for preventative services including immunizations, lifestyle education, counseling to decrease risk of preventable diseases and screening for fall risk and other medical concerns.    This visit is provided free of charge (no copay) for all Medicare recipients. The clinical pharmacists at [facility] have begun to conduct these Wellness Visits which will also include a thorough review of all your medications.    As you primary medical provider recommend that you make an appointment for your Annual Wellness Visit if you have not done so already this year.  You may set up this appointment before you leave today or you may call back ([phone]) and schedule an appointment.  Please make sure when you call that you mention that you are scheduling your Annual Wellness Visit with the clinical pharmacist so that the appointment may be made for the proper length of time.  Continue current medications. Continue good therapeutic lifestyle changes which include good diet and exercise. Fall precautions discussed with patient. If an FOBT was given today- please return it to our front desk. If you are over 32 years old - you may need Prevnar 87 or the adult Pneumonia vaccine.  Flu Shots are still available at our office. If you still haven't had one please call to set up a nurse visit to get one.   After your visit with [place] today you will receive a survey in the mail or online from [employer] regarding your care with [place]. Please take a moment to fill this out. Your feedback is very important to [place] as you can help [place] better understand your patient needs as well as improve your experience and satisfaction. WE CARE ABOUT YOU!!!   Continue regular follow-up with cardiology Stay active and always do not put yourself at risk for falling Drink plenty of fluids  especially this summer to stay well hydrated   Arrie Senate MD

## 2015-02-08 LAB — HEPATIC FUNCTION PANEL
ALBUMIN: 4 g/dL (ref 3.5–4.7)
ALT: 9 IU/L (ref 0–32)
AST: 19 IU/L (ref 0–40)
Alkaline Phosphatase: 76 IU/L (ref 39–117)
Bilirubin Total: 0.4 mg/dL (ref 0.0–1.2)
Bilirubin, Direct: 0.14 mg/dL (ref 0.00–0.40)
Total Protein: 6.9 g/dL (ref 6.0–8.5)

## 2015-02-08 LAB — BMP8+EGFR
BUN/Creatinine Ratio: 15 (ref 11–26)
BUN: 13 mg/dL (ref 8–27)
CO2: 27 mmol/L (ref 18–29)
Calcium: 9.3 mg/dL (ref 8.7–10.3)
Chloride: 101 mmol/L (ref 97–108)
Creatinine, Ser: 0.88 mg/dL (ref 0.57–1.00)
GFR calc non Af Amer: 61 mL/min/{1.73_m2} (ref >59)
GFR, EST AFRICAN AMERICAN: 71 mL/min/{1.73_m2} (ref >59)
GLUCOSE: 87 mg/dL (ref 65–99)
Potassium: 4.8 mmol/L (ref 3.5–5.2)
SODIUM: 140 mmol/L (ref 134–144)

## 2015-02-08 LAB — SEDIMENTATION RATE: Sed Rate: 10 mm/hr (ref 0–40)

## 2015-02-08 LAB — NMR, LIPOPROFILE
Cholesterol: 152 mg/dL (ref 100–199)
HDL Cholesterol by NMR: 67 mg/dL (ref >39)
HDL Particle Number: 37.6 umol/L (ref >=30.5)
LDL Particle Number: 737 nmol/L (ref <1000)
LDL SIZE: 20.8 nm (ref >20.5)
LDL-C: 72 mg/dL (ref 0–99)
LP-IR Score: <25 (ref <=45)
SMALL LDL PARTICLE NUMBER: 276 nmol/L (ref <=527)
TRIGLYCERIDES BY NMR: 67 mg/dL (ref 0–149)

## 2015-02-08 LAB — VITAMIN D 25 HYDROXY (VIT D DEFICIENCY, FRACTURES): Vit D, 25-Hydroxy: 55.4 ng/mL (ref 30.0–100.0)

## 2015-02-08 LAB — URIC ACID: URIC ACID: 4.9 mg/dL (ref 2.5–7.1)

## 2015-02-08 LAB — THYROID PANEL WITH TSH
Free Thyroxine Index: 2.5 (ref 1.2–4.9)
T3 Uptake Ratio: 30 % (ref 24–39)
T4, Total: 8.3 ug/dL (ref 4.5–12.0)
TSH: 2.57 u[IU]/mL (ref 0.450–4.500)

## 2015-02-20 ENCOUNTER — Encounter: Payer: Self-pay | Admitting: *Deleted

## 2015-02-24 DIAGNOSIS — L570 Actinic keratosis: Secondary | ICD-10-CM | POA: Diagnosis not present

## 2015-02-24 DIAGNOSIS — L814 Other melanin hyperpigmentation: Secondary | ICD-10-CM | POA: Diagnosis not present

## 2015-02-24 DIAGNOSIS — L720 Epidermal cyst: Secondary | ICD-10-CM | POA: Diagnosis not present

## 2015-02-24 DIAGNOSIS — D1801 Hemangioma of skin and subcutaneous tissue: Secondary | ICD-10-CM | POA: Diagnosis not present

## 2015-02-24 DIAGNOSIS — L718 Other rosacea: Secondary | ICD-10-CM | POA: Diagnosis not present

## 2015-02-24 DIAGNOSIS — L821 Other seborrheic keratosis: Secondary | ICD-10-CM | POA: Diagnosis not present

## 2015-03-27 ENCOUNTER — Ambulatory Visit (INDEPENDENT_AMBULATORY_CARE_PROVIDER_SITE_OTHER): Payer: Medicare PPO | Admitting: Family

## 2015-03-27 ENCOUNTER — Encounter: Payer: Self-pay | Admitting: Family

## 2015-03-27 VITALS — BP 119/80 | HR 87 | Temp 97.9°F | Ht 62.5 in | Wt 126.6 lb

## 2015-03-27 DIAGNOSIS — J069 Acute upper respiratory infection, unspecified: Secondary | ICD-10-CM

## 2015-03-27 MED ORDER — BENZONATATE 200 MG PO CAPS: 200.0000 mg | ORAL_CAPSULE | Freq: Three times a day (TID) | ORAL | Status: DC | PRN

## 2015-03-27 MED ORDER — METHYLPREDNISOLONE 4 MG PO TBPK: ORAL_TABLET | ORAL | Status: DC

## 2015-03-27 NOTE — Patient Instructions (Signed)
Upper Respiratory Infection, Adult An upper respiratory infection (URI) is also sometimes known as the common cold. The upper respiratory tract includes the nose, sinuses, throat, trachea, and bronchi. Bronchi are the airways leading to the lungs. Most people improve within 1 week, but symptoms can last up to 2 weeks. A residual cough may last even longer.  CAUSES Many different viruses can infect the tissues lining the upper respiratory tract. The tissues become irritated and inflamed and often become very moist. Mucus production is also common. A cold is contagious. You can easily spread the virus to others by oral contact. This includes kissing, sharing a glass, coughing, or sneezing. Touching your mouth or nose and then touching a surface, which is then touched by another person, can also spread the virus. SYMPTOMS  Symptoms typically develop 1 to 3 days after you come in contact with a cold virus. Symptoms vary from person to person. They may include:  Runny nose.  Sneezing.  Nasal congestion.  Sinus irritation.  Sore throat.  Loss of voice (laryngitis).  Cough.  Fatigue.  Muscle aches.  Loss of appetite.  Headache.  Low-grade fever. DIAGNOSIS  You might diagnose your own cold based on familiar symptoms, since most people get a cold 2 to 3 times a year. Your caregiver can confirm this based on your exam. Most importantly, your caregiver can check that your symptoms are not due to another disease such as strep throat, sinusitis, pneumonia, asthma, or epiglottitis. Blood tests, throat tests, and X-rays are not necessary to diagnose a common cold, but they may sometimes be helpful in excluding other more serious diseases. Your caregiver will decide if any further tests are required. RISKS AND COMPLICATIONS  You may be at risk for a more severe case of the common cold if you smoke cigarettes, have chronic heart disease (such as heart failure) or lung disease (such as asthma), or if  you have a weakened immune system. The very young and very old are also at risk for more serious infections. Bacterial sinusitis, middle ear infections, and bacterial pneumonia can complicate the common cold. The common cold can worsen asthma and chronic obstructive pulmonary disease (COPD). Sometimes, these complications can require emergency medical care and may be life-threatening. PREVENTION  The best way to protect against getting a cold is to practice good hygiene. Avoid oral or hand contact with people with cold symptoms. Wash your hands often if contact occurs. There is no clear evidence that vitamin C, vitamin E, echinacea, or exercise reduces the chance of developing a cold. However, it is always recommended to get plenty of rest and practice good nutrition. TREATMENT  Treatment is directed at relieving symptoms. There is no cure. Antibiotics are not effective, because the infection is caused by a virus, not by bacteria. Treatment may include:  Increased fluid intake. Sports drinks offer valuable electrolytes, sugars, and fluids.  Breathing heated mist or steam (vaporizer or shower).  Eating chicken soup or other clear broths, and maintaining good nutrition.  Getting plenty of rest.  Using gargles or lozenges for comfort.  Controlling fevers with ibuprofen or acetaminophen as directed by your caregiver.  Increasing usage of your inhaler if you have asthma. Zinc gel and zinc lozenges, taken in the first 24 hours of the common cold, can shorten the duration and lessen the severity of symptoms. Pain medicines may help with fever, muscle aches, and throat pain. A variety of non-prescription medicines are available to treat congestion and runny nose. Your caregiver   can make recommendations and may suggest nasal or lung inhalers for other symptoms.  HOME CARE INSTRUCTIONS   Only take over-the-counter or prescription medicines for pain, discomfort, or fever as directed by your  caregiver.  Use a warm mist humidifier or inhale steam from a shower to increase air moisture. This may keep secretions moist and make it easier to breathe.  Drink enough water and fluids to keep your urine clear or pale yellow.  Rest as needed.  Return to work when your temperature has returned to normal or as your caregiver advises. You may need to stay home longer to avoid infecting others. You can also use a face mask and careful hand washing to prevent spread of the virus. SEEK MEDICAL CARE IF:   After the first few days, you feel you are getting worse rather than better.  You need your caregiver's advice about medicines to control symptoms.  You develop chills, worsening shortness of breath, or brown or red sputum. These may be signs of pneumonia.  You develop yellow or brown nasal discharge or pain in the face, especially when you bend forward. These may be signs of sinusitis.  You develop a fever, swollen neck glands, pain with swallowing, or white areas in the back of your throat. These may be signs of strep throat. SEEK IMMEDIATE MEDICAL CARE IF:   You have a fever.  You develop severe or persistent headache, ear pain, sinus pain, or chest pain.  You develop wheezing, a prolonged cough, cough up blood, or have a change in your usual mucus (if you have chronic lung disease).  You develop sore muscles or a stiff neck. Document Released: 01/15/2001 Document Revised: 10/14/2011 Document Reviewed: 10/27/2013 Exit[employer] Patient Information 2015 [employer]. This information is not intended to replace advice given to you by your health care provider. Make sure you discuss any questions you have with your health care provider.  - Take meds as prescribed - Use a cool mist humidifier  -Use saline nose sprays frequently -Saline irrigations of the nose can be very helpful if done frequently.  * 4X daily for 1 week*  * Use of a nettie pot can be helpful with this. Follow  directions with this* -Force fluids -For any cough or congestion  Use plain Mucinex- regular strength or max strength is fine   * Children- consult with Pharmacist for dosing -For fever or aces or pains- take tylenol or ibuprofen appropriate for age and weight.  * for fevers greater than 101 orally you may alternate ibuprofen and tylenol every  3 hours. -Throat lozenges if help   Alfonsa Vaile, FNP   

## 2015-03-27 NOTE — Progress Notes (Signed)
Subjective:    Patient ID: [name], female    DOB: 1931/06/16, 81 y.o.   MRN: [id number]  Cough This is a new problem. The current episode started in the past 7 days (last Thursday). The problem has been waxing and waning. The problem occurs constantly. The cough is non-productive. Associated symptoms include nasal congestion, postnasal drip, rhinorrhea, a sore throat, shortness of breath and wheezing. Pertinent negatives include no chills, ear congestion, ear pain, fever, headaches or myalgias. The symptoms are aggravated by lying down. She has tried rest (benadryl) for the symptoms. The treatment provided mild relief. There is no history of asthma or COPD.      Review of Systems  Constitutional: Negative.  Negative for fever and chills.  HENT: Positive for postnasal drip, rhinorrhea and sore throat. Negative for ear pain.   Eyes: Negative.   Respiratory: Positive for cough, shortness of breath and wheezing.   Cardiovascular: Negative.  Negative for palpitations.  Gastrointestinal: Negative.   Endocrine: Negative.   Genitourinary: Negative.   Musculoskeletal: Negative.  Negative for myalgias.  Neurological: Negative.  Negative for headaches.  Hematological: Negative.   Psychiatric/Behavioral: Negative.   All other systems reviewed and are negative.      Objective:   Physical Exam  Constitutional: She is oriented to person, place, and time. She appears well-developed and well-nourished. No distress.  HENT:  Head: Normocephalic and atraumatic.  Right Ear: External ear normal.  Left Ear: External ear normal.  Oropharynx  Mildly erythemas   Eyes: Pupils are equal, round, and reactive to light.  Neck: Normal range of motion. Neck supple. No thyromegaly present.  Cardiovascular: Normal rate, regular rhythm, normal heart sounds and intact distal pulses.   No murmur heard. Pulmonary/Chest: Effort normal and breath sounds normal. No respiratory distress. She has no  wheezes.  Abdominal: Soft. Bowel sounds are normal. She exhibits no distension. There is no tenderness.  Musculoskeletal: Normal range of motion. She exhibits no edema or tenderness.  Neurological: She is alert and oriented to person, place, and time. She has normal reflexes. No cranial nerve deficit.  Skin: Skin is warm and dry.  Psychiatric: She has a normal mood and affect. Her behavior is normal. Judgment and thought content normal.  Vitals reviewed.     BP 119/80 mmHg  Pulse 87  Temp(Src) 97.9 F (36.6 C) (Oral)  Ht 5' 2.5" (1.588 m)  Wt 126 lb 9.6 oz (57.425 kg)  BMI 22.77 kg/m2     Assessment & Plan:  1. Acute upper respiratory infection -- Take meds as prescribed - Use a cool mist humidifier  -Use saline nose sprays frequently -Saline irrigations of the nose can be very helpful if done frequently.  * 4X daily for 1 week*  * Use of a nettie pot can be helpful with this. Follow directions with this* -Force fluids -For any cough or congestion  Use plain Mucinex- regular strength or max strength is fine   * Children- consult with Pharmacist for dosing -For fever or aces or pains- take tylenol or ibuprofen appropriate for age and weight.  * for fevers greater than 101 orally you may alternate ibuprofen and tylenol every  3 hours. -Throat lozenges if help - methylPREDNISolone (MEDROL DOSEPAK) 4 MG TBPK tablet; Use as directed  Dispense: 21 tablet; Refill: 0 - benzonatate (TESSALON) 200 MG capsule; Take 1 capsule (200 mg total) by mouth 3 (three) times daily as needed.  Dispense: 30 capsule; Refill: [place], FNP

## 2015-04-06 ENCOUNTER — Ambulatory Visit (INDEPENDENT_AMBULATORY_CARE_PROVIDER_SITE_OTHER): Payer: Medicare PPO

## 2015-04-06 ENCOUNTER — Ambulatory Visit (INDEPENDENT_AMBULATORY_CARE_PROVIDER_SITE_OTHER): Payer: Medicare PPO | Admitting: Family Medicine

## 2015-04-06 ENCOUNTER — Encounter: Payer: Self-pay | Admitting: Family Medicine

## 2015-04-06 VITALS — BP 99/67 | HR 90 | Temp 98.0°F | Ht 62.5 in | Wt 125.8 lb

## 2015-04-06 DIAGNOSIS — J209 Acute bronchitis, unspecified: Secondary | ICD-10-CM

## 2015-04-06 DIAGNOSIS — Z961 Presence of intraocular lens: Secondary | ICD-10-CM | POA: Diagnosis not present

## 2015-04-06 DIAGNOSIS — H26493 Other secondary cataract, bilateral: Secondary | ICD-10-CM | POA: Diagnosis not present

## 2015-04-06 DIAGNOSIS — H40013 Open angle with borderline findings, low risk, bilateral: Secondary | ICD-10-CM | POA: Diagnosis not present

## 2015-04-06 MED ORDER — ALBUTEROL SULFATE (2.5 MG/3ML) 0.083% IN NEBU: 2.5000 mg | INHALATION_SOLUTION | Freq: Four times a day (QID) | RESPIRATORY_TRACT | Status: DC | PRN

## 2015-04-06 MED ORDER — AMOXICILLIN-POT CLAVULANATE 875-125 MG PO TABS: 1.0000 | ORAL_TABLET | Freq: Two times a day (BID) | ORAL | Status: DC

## 2015-04-06 NOTE — Assessment & Plan Note (Signed)
Patient has been having worsening cough for the past week. Performed a chest x-ray today which did not show any acute cardiopulmonary processes but did show the COPD changes that of been there before. We'll send her with the rescue inhaler and Augmentin with instructions to return if not improved or worsens. She can continue to take her cough suppressants and Mucinex, also recommended for her to get Flonase

## 2015-04-06 NOTE — Progress Notes (Signed)
BP 99/67 mmHg  Pulse 90  Temp(Src) 98 F (36.7 C) (Oral)  Ht 5' 2.5" (1.588 m)  Wt 125 lb 12.8 oz (57.063 kg)  BMI 22.63 kg/m2   Subjective:    Patient ID: [name], female    DOB: 12-08-1930, 81 y.o.   MRN: [id number]  HPI: [name] is a 81 y.o. female presenting on 04/06/2015 for Cough   HPI Cough Patient presents today with a persistent and worsening cough. The cough is been present for over a week. She was seen here by one of my partners and started on a Medrol Dosepak, Tessalon Perles, and Mucinex. Patient has not received any resolution from these. She is coughing up whitish sputum. She is having frequent coughing spells throughout the day, but denies any issues at night. She denies any fevers or chills and she has never had an episode quite this bad previously.  Relevant past medical, surgical, family and social history reviewed and updated as indicated. Interim medical history since our last visit reviewed. Allergies and medications reviewed and updated.  Review of Systems  Constitutional: Negative for fever, chills and unexpected weight change.  HENT: Positive for congestion and sore throat. Negative for ear discharge and ear pain.   Eyes: Negative for redness and visual disturbance.  Respiratory: Positive for cough. Negative for chest tightness, shortness of breath and wheezing.   Cardiovascular: Negative for chest pain and leg swelling.  Endocrine: Negative for cold intolerance and heat intolerance.  Genitourinary: Negative for dysuria and difficulty urinating.  Musculoskeletal: Negative for back pain and gait problem.  Skin: Negative for rash.  Neurological: Negative for dizziness, light-headedness and headaches.  Psychiatric/Behavioral: Negative for behavioral problems and agitation.  All other systems reviewed and are negative.   Per HPI unless specifically indicated above     Medication List       This list is accurate as of:  04/06/15 11:16 AM.  Always use your most recent med list.               albuterol (2.5 MG/3ML) 0.083% nebulizer solution  Commonly known as:  PROVENTIL  Take 3 mLs (2.5 mg total) by nebulization every 6 (six) hours as needed for wheezing or shortness of breath.     amoxicillin-clavulanate 875-125 MG per tablet  Commonly known as:  AUGMENTIN  Take 1 tablet by mouth 2 (two) times daily.     atorvastatin 10 MG tablet  Commonly known as:  LIPITOR  TAKE 1 TABLET BY MOUTH ONCE A DAY OR AS INSTRUCTED BY PHYSICIAN     BENADRYL ALLERGY PO  Take by mouth. OTC     benzonatate 200 MG capsule  Commonly known as:  TESSALON  Take 1 capsule (200 mg total) by mouth 3 (three) times daily as needed.     Calcium Carbonate-Vit D-Min 1200-1000 MG-UNIT Chew  Chew 1 tablet by mouth daily.     chlorhexidine 0.12 % solution  Commonly known as:  PERIDEX  Mouthwash - Use as dierected     diltiazem 180 MG 24 hr capsule  Commonly known as:  CARDIZEM CD  TAKE 1 CAPSULE (180 MG TOTAL) BY MOUTH DAILY.     folic acid 1 MG tablet  Commonly known as:  FOLVITE  Take 1 mg by mouth daily.     ipratropium 0.06 % nasal spray  Commonly known as:  ATROVENT  PLACE 1 SPRAY INTO EACH NOSTRIL TWICE A DAY AS NEEDED     Magnesium 500  MG Tabs  Take 1 tablet by mouth daily.     methylPREDNISolone 4 MG Tbpk tablet  Commonly known as:  MEDROL DOSEPAK  Use as directed     multivitamin capsule  Take 1 capsule by mouth daily.     Vitamin D3 1000 UNITS Caps  Take 1 capsule by mouth daily.     XARELTO 15 MG Tabs tablet  Generic drug:  Rivaroxaban  TAKE 1 TABLET (15 MG TOTAL) BY MOUTH DAILY.           Objective:    BP 99/67 mmHg  Pulse 90  Temp(Src) 98 F (36.7 C) (Oral)  Ht 5' 2.5" (1.588 m)  Wt 125 lb 12.8 oz (57.063 kg)  BMI 22.63 kg/m2  Wt Readings from Last 3 Encounters:  04/06/15 125 lb 12.8 oz (57.063 kg)  03/27/15 126 lb 9.6 oz (57.425 kg)  02/07/15 126 lb (57.153 kg)    Physical Exam    Constitutional: She is oriented to person, place, and time. She appears well-developed and well-nourished. No distress.  HENT:  Right Ear: Tympanic membrane, external ear and ear canal normal.  Left Ear: Tympanic membrane, external ear and ear canal normal.  Nose: Nose normal. Right sinus exhibits no maxillary sinus tenderness and no frontal sinus tenderness. Left sinus exhibits no maxillary sinus tenderness and no frontal sinus tenderness.  Mouth/Throat: Uvula is midline and mucous membranes are normal. Posterior oropharyngeal edema present. No oropharyngeal exudate or posterior oropharyngeal erythema.  Eyes: Conjunctivae and EOM are normal. Pupils are equal, round, and reactive to light.  Cardiovascular: Normal rate, regular rhythm and normal heart sounds.   No murmur heard. Pulmonary/Chest: Effort normal and breath sounds normal. No respiratory distress. She has no wheezes.  Musculoskeletal: Normal range of motion. She exhibits no edema or tenderness.  Neurological: She is alert and oriented to person, place, and time. Coordination normal.  Skin: Skin is warm and dry. No rash noted. She is not diaphoretic.  Psychiatric: She has a normal mood and affect. Her behavior is normal.  Vitals reviewed.  Chest x-ray: Preliminary read by Dr. Warrick Parisian shows that the chest x-ray has COPD changes as her previous chest x-ray dated but there are no acute processes going on.  Results for orders placed or performed in visit on 02/07/15  [facility]  Result Value Ref Range   Glucose 87 65 - 99 mg/dL   BUN 13 8 - 27 mg/dL   Creatinine, Ser 0.88 0.57 - 1.00 mg/dL   GFR calc non Af Amer 61 >59 mL/min/1.73   GFR calc Af Amer 71 >59 mL/min/1.73   BUN/Creatinine Ratio 15 11 - 26   Sodium 140 134 - 144 mmol/L   Potassium 4.8 3.5 - 5.2 mmol/L   Chloride 101 97 - 108 mmol/L   CO2 27 18 - 29 mmol/L   Calcium 9.3 8.7 - 10.3 mg/dL  Hepatic function panel  Result Value Ref Range   Total Protein 6.9 6.0 - 8.5  g/dL   Albumin 4.0 3.5 - 4.7 g/dL   Bilirubin Total 0.4 0.0 - 1.2 mg/dL   Bilirubin, Direct 0.14 0.00 - 0.40 mg/dL   Alkaline Phosphatase 76 39 - 117 IU/L   AST 19 0 - 40 IU/L   ALT 9 0 - 32 IU/L  NMR, lipoprofile  Result Value Ref Range   LDL Particle Number 737 <1000 nmol/L   LDL-C 72 0 - 99 mg/dL   HDL Cholesterol by NMR 67 >39 mg/dL   Triglycerides by  NMR 67 0 - 149 mg/dL   Cholesterol 152 100 - 199 mg/dL   HDL Particle Number 37.6 >=30.5 umol/L   Small LDL Particle Number 276 <=527 nmol/L   LDL Size 20.8 >20.5 nm   LP-IR Score <25 <=45  Thyroid Panel With TSH  Result Value Ref Range   TSH 2.570 0.450 - 4.500 uIU/mL   T4, Total 8.3 4.5 - 12.0 ug/dL   T3 Uptake Ratio 30 24 - 39 %   Free Thyroxine Index 2.5 1.2 - 4.9  Vit D  25 hydroxy (rtn osteoporosis monitoring)  Result Value Ref Range   Vit D, 25-Hydroxy 55.4 30.0 - 100.0 ng/mL  Uric acid  Result Value Ref Range   Uric Acid 4.9 2.5 - 7.1 mg/dL  Sedimentation rate  Result Value Ref Range   Sed Rate 10 0 - 40 mm/hr  POCT CBC  Result Value Ref Range   WBC 8.2 4.6 - 10.2 K/uL   Lymph, poc 2.8 0.6 - 3.4   POC LYMPH PERCENT 34.1 10 - 50 %L   POC Granulocyte 4.9 2 - 6.9   Granulocyte percent 59.3 37 - 80 %G   RBC 4.38 4.04 - 5.48 M/uL   Hemoglobin 12.9 12.2 - 16.2 g/dL   HCT, POC 40.3 37.7 - 47.9 %   MCV 92.0 80 - 97 fL   MCH, POC 29.5 27 - 31.2 pg   MCHC 32.0 31.8 - 35.4 g/dL   RDW, POC 13.4 %   Platelet Count, POC 243.0 142 - 424 K/uL   MPV 7.7 0 - 99.8 fL      Assessment & Plan:   Problem List Items Addressed This Visit      Respiratory   Acute bronchitis - Primary    Patient has been having worsening cough for the past week. Performed a chest x-ray today which did not show any acute cardiopulmonary processes but did show the COPD changes that of been there before. We'll send her with the rescue inhaler and Augmentin with instructions to return if not improved or worsens. She can continue to take her cough  suppressants and Mucinex, also recommended for her to get Flonase      Relevant Medications   albuterol (PROVENTIL) (2.5 MG/3ML) 0.083% nebulizer solution   amoxicillin-clavulanate (AUGMENTIN) 875-125 MG per tablet   Other Relevant Orders   DG Chest 2 View       Follow up plan: Return if symptoms worsen or fail to improve.  Caryl Pina, MD [facility] 04/06/2015, 11:16 AM

## 2015-04-14 ENCOUNTER — Ambulatory Visit (INDEPENDENT_AMBULATORY_CARE_PROVIDER_SITE_OTHER): Payer: Medicare PPO | Admitting: Nurse Practitioner

## 2015-04-14 ENCOUNTER — Encounter: Payer: Self-pay | Admitting: Nurse Practitioner

## 2015-04-14 VITALS — BP 110/76 | HR 82 | Temp 97.1°F | Ht 62.0 in | Wt 125.0 lb

## 2015-04-14 DIAGNOSIS — J441 Chronic obstructive pulmonary disease with (acute) exacerbation: Secondary | ICD-10-CM | POA: Diagnosis not present

## 2015-04-14 DIAGNOSIS — Z01419 Encounter for gynecological examination (general) (routine) without abnormal findings: Secondary | ICD-10-CM

## 2015-04-14 DIAGNOSIS — R87615 Unsatisfactory cytologic smear of cervix: Secondary | ICD-10-CM | POA: Diagnosis not present

## 2015-04-14 DIAGNOSIS — J209 Acute bronchitis, unspecified: Secondary | ICD-10-CM | POA: Diagnosis not present

## 2015-04-14 LAB — POCT UA - MICROSCOPIC ONLY
Bacteria, U Microscopic: NEGATIVE
Casts, Ur, LPF, POC: NEGATIVE
Crystals, Ur, HPF, POC: NEGATIVE
Mucus, UA: NEGATIVE
RBC, URINE, MICROSCOPIC: NEGATIVE
YEAST UA: NEGATIVE

## 2015-04-14 LAB — POCT URINALYSIS DIPSTICK
Bilirubin, UA: NEGATIVE
Blood, UA: NEGATIVE
Glucose, UA: NEGATIVE
KETONES UA: NEGATIVE
Nitrite, UA: NEGATIVE
PH UA: 5
PROTEIN UA: NEGATIVE
Spec Grav, UA: 1.015
UROBILINOGEN UA: NEGATIVE

## 2015-04-14 MED ORDER — PREDNISONE 20 MG PO TABS: ORAL_TABLET | ORAL | Status: DC

## 2015-04-14 MED ORDER — METHYLPREDNISOLONE ACETATE 80 MG/ML IJ SUSP
80.0000 mg | Freq: Once | INTRAMUSCULAR | Status: AC
  Administered 2015-04-14: 80 mg via INTRAMUSCULAR

## 2015-04-14 NOTE — Addendum Note (Signed)
Addended by: Earlene Plater on: 04/14/2015 09:58 AM   Modules accepted: Miquel Dunn

## 2015-04-14 NOTE — Progress Notes (Signed)
   Subjective:    Patient ID: [name], female    DOB: 1931-06-11, 81 y.o.   MRN: [id number]  HPI Regular patient of Dr. Laurance Flatten that is here today for Pelvic exam- She is complaining of a cough that has been going on for over a month. SHe saw Dr. Warrick Parisian and was given amugmentin and albuterol. Patient still coughing. Not much change with meds.    Review of Systems  Constitutional: Negative.   Respiratory: Positive for cough and shortness of breath. Negative for wheezing and stridor.   Cardiovascular: Negative.   Gastrointestinal: Negative.   Genitourinary: Negative.   Musculoskeletal: Negative.   Neurological: Negative.   Psychiatric/Behavioral: Negative.   All other systems reviewed and are negative.      Objective:   Physical Exam  Constitutional: She is oriented to person, place, and time. She appears well-developed and well-nourished.  HENT:  Head: Normocephalic.  Right Ear: Hearing, tympanic membrane, external ear and ear canal normal.  Left Ear: Hearing, tympanic membrane, external ear and ear canal normal.  Nose: Nose normal.  Mouth/Throat: Uvula is midline and oropharynx is clear and moist.  Eyes: Conjunctivae and EOM are normal. Pupils are equal, round, and reactive to light.  Neck: Normal range of motion and full passive range of motion without pain. Neck supple. No JVD present. Carotid bruit is not present. No thyroid mass and no thyromegaly present.  Cardiovascular: Normal rate, normal heart sounds and intact distal pulses.   No murmur heard. Pulmonary/Chest: Effort normal and breath sounds normal. Right breast exhibits no inverted nipple, no mass, no nipple discharge, no skin change and no tenderness. Left breast exhibits no inverted nipple, no mass, no nipple discharge, no skin change and no tenderness.  Abdominal: Soft. Bowel sounds are normal. She exhibits no mass. There is no tenderness.  Genitourinary: Vagina normal and uterus normal. No breast  swelling, tenderness, discharge or bleeding.  bimanual exam-No adnexal masses or tenderness. Cervical stenosis- no vaginal discharge  Musculoskeletal: Normal range of motion.  Lymphadenopathy:    She has no cervical adenopathy.  Neurological: She is alert and oriented to person, place, and time.  Skin: Skin is warm and dry.  Psychiatric: She has a normal mood and affect. Her behavior is normal. Judgment and thought content normal.    BP 110/76 mmHg  Pulse 82  Temp(Src) 97.1 F (36.2 C) (Oral)  Ht 5\' 2"  (1.575 m)  Wt 125 lb (56.7 kg)  BMI 22.86 kg/m2       Assessment & Plan:  1. Encounter for routine gynecological examination - POCT UA - Microscopic Only - POCT urinalysis dipstick - Pap IG (Image Guided)  2. COPD exacerbation Force fluids - predniSONE (DELTASONE) 20 MG tablet; 2 po at sametime daily for 5 days- start tomorrow  Dispense: 10 tablet; Refill: 0 - methylPREDNISolone acetate (DEPO-MEDROL) injection 80 mg; Inject 1 mL (80 mg total) into the muscle once.  Mary-Margaret Hassell Done, FNP

## 2015-04-14 NOTE — Addendum Note (Signed)
Addended by: Chevis Pretty on: 04/14/2015 10:01 AM   Modules accepted: Miquel Dunn

## 2015-04-14 NOTE — Patient Instructions (Signed)
Pap Test A Pap test checks the cells on the surface of your cervix. Your doctor will look for cell changes that are not normal, an infection, or cancer. If the cells no longer look normal, it is called dysplasia. Dysplasia can turn into cancer. Regular Pap tests are important to stop cancer from developing. BEFORE THE PROCEDURE  Ask your doctor when to schedule your Pap test. Timing the test around your period may be important.  Do not douche or have sex (intercourse) for 24 hours before the test.  Do not put creams on your vagina or use tampons for 24 hours before the test.  Go pee (urinate) just before the test. PROCEDURE  You will lie on an exam table with your feet in stirrups.  A warm metal or plastic tool (speculum) will be put in your vagina to open it up.  Your doctor will use a small, plastic brush or wooden spatula to take cells from your cervix.  The cells will be put in a lab container.  The cells will be checked under a microscope to see if they are normal or not. AFTER THE PROCEDURE Get your test results. If they are abnormal, you may need more tests. Document Released: 08/24/2010 Document Revised: 10/14/2011 Document Reviewed: 07/18/2011 Exit[employer] Patient Information 2015 [employer]. This information is not intended to replace advice given to you by your health care provider. Make sure you discuss any questions you have with your health care provider.  

## 2015-04-19 LAB — PAP IG (IMAGE GUIDED): PAP SMEAR COMMENT: 0

## 2015-04-26 ENCOUNTER — Other Ambulatory Visit: Payer: Self-pay

## 2015-04-26 ENCOUNTER — Encounter: Payer: Self-pay | Admitting: Internal Medicine

## 2015-04-26 ENCOUNTER — Ambulatory Visit (INDEPENDENT_AMBULATORY_CARE_PROVIDER_SITE_OTHER): Payer: Medicare PPO | Admitting: Internal Medicine

## 2015-04-26 VITALS — BP 122/76 | HR 91 | Ht 62.5 in | Wt 127.8 lb

## 2015-04-26 DIAGNOSIS — I481 Persistent atrial fibrillation: Secondary | ICD-10-CM | POA: Diagnosis not present

## 2015-04-26 DIAGNOSIS — I4819 Other persistent atrial fibrillation: Secondary | ICD-10-CM

## 2015-04-26 DIAGNOSIS — Q2733 Arteriovenous malformation of digestive system vessel: Secondary | ICD-10-CM | POA: Diagnosis not present

## 2015-04-26 DIAGNOSIS — I495 Sick sinus syndrome: Secondary | ICD-10-CM | POA: Diagnosis not present

## 2015-04-26 DIAGNOSIS — K5520 Angiodysplasia of colon without hemorrhage: Secondary | ICD-10-CM | POA: Insufficient documentation

## 2015-04-26 NOTE — Progress Notes (Signed)
PCP:  [name], MD  Electrophysiologist: Dr. Rayann Heman  [name] is a 81 y/o female, returns today for routine electrophysiology followup.  She continues to be doing very well with rate control alone.  She thinks her rhythm is normal at times based on her HR by her BP cuff at home, not due to any symptoms or lack of them.  Today iin AF she is surprised to hear she is out of rhythm, feeling no palpitations or symptoms of any kind.  She continues to line dance and goes to the gym 3x week without changes in her exertional capacity which sounds very good.  She is traveling and doing all the things she would like to without difficulty. She continues to tolerate her Xarelto without symptoms, no bleeding or signs of bleeding.  Today, she denies symptoms of chest pain, orthopnea, PND, lower extremity edema, dizziness, presyncope, syncope, or neurologic sequela.  The patient feels that she is tolerating medications without difficulties and is otherwise without complaint today.   Past Medical History  Diagnosis Date  . Hyperlipidemia   . Microcytic anemia   . Osteopenia   . AV malformation of gastrointestinal tract     in cecum she denies history of GIB  . Persistent atrial fibrillation     s/p PVI 04/2010  CHADS2vasc at least 5  . Sinus bradycardia   . CVA (cerebral vascular accident) 2007  . Arthritis   . Dizziness     denies this as an ongoing problem  . Breast mass, right   . Cataract   . RLS (restless legs syndrome)    Past Surgical History  Procedure Laterality Date  . Breast mass resected    . Afib ablation  04/2010    PVI with CTI ablation performed by [facility]  . Cardioversion      for afib  . Cataract extraction w/phaco  11/11/2011    Procedure: CATARACT EXTRACTION PHACO AND INTRAOCULAR LENS PLACEMENT (IOC);  Surgeon: Tonny Branch, MD;  Location: [facility];  Service: Ophthalmology;  Laterality: Left;  CDE:12.62  . Cataract extraction w/phaco  11/25/2011    Procedure: CATARACT  EXTRACTION PHACO AND INTRAOCULAR LENS PLACEMENT (IOC);  Surgeon: Tonny Branch, MD;  Location: [facility];  Service: Ophthalmology;  Laterality: Right;  CDE 16.14  . Dental inplants      Current Outpatient Prescriptions  Medication Sig Dispense Refill  . atorvastatin (LIPITOR) 10 MG tablet Take 5 mg by mouth daily.     . Calcium Carbonate-Vit D-Min 1200-1000 MG-UNIT CHEW Chew 1 tablet by mouth daily.    . chlorhexidine (PERIDEX) 0.12 % solution Mouthwash - Use as directed    . Cholecalciferol (VITAMIN D3) 1000 UNITS CAPS Take 1 capsule by mouth daily.    Marland Kitchen diltiazem (CARDIZEM CD) 180 MG 24 hr capsule TAKE 1 CAPSULE (180 MG TOTAL) BY MOUTH DAILY. 90 capsule 2  . folic acid (FOLVITE) 1 MG tablet Take 1 mg by mouth daily.    . Magnesium 500 MG TABS Take 1 tablet by mouth daily.    . Multiple Vitamin (MULTIVITAMIN) capsule Take 1 capsule by mouth daily.      Alveda Reasons 15 MG TABS tablet TAKE 1 TABLET (15 MG TOTAL) BY MOUTH DAILY. 30 tablet 4   No current facility-administered medications for this visit.   ROS- all systems are reviewed and negative except as per HPI above  No Known Allergies  Social History   Social History  . Marital Status: Widowed  Spouse Name: N/A  . Number of Children: 0  . Years of Education: college   Occupational History  . retired    Social History Main Topics  . Smoking status: Former Smoker -- 1.00 packs/day for 30 years    Types: Cigarettes  . Smokeless tobacco: Never Used     Comment: quit in 1980s  . Alcohol Use: No  . Drug Use: No  . Sexual Activity: No   Other Topics Concern  . Not on file   Social History Narrative   Patient is retired and lives at home alone. Patient has business college education.    Right handed.   Caffeine- some times - soda     Family History  Problem Relation Age of Onset  . Stroke Mother 7  . Hip fracture Mother   . Anesthesia problems Neg Hx   . Hypotension Neg Hx   . Malignant hyperthermia Neg Hx   .  Pseudochol deficiency Neg Hx   . Lung cancer Father 46    ROS-  All systems are reviewed and are negative except as outlined in the HPI above  Physical Exam: Filed Vitals:   04/26/15 1114  BP: 122/76  Pulse: 91    GEN- The patient is well appearing, alert and oriented x 3 today.   Head- normocephalic, atraumatic Eyes-  Sclera clear, conjunctiva pink Ears- hearing intact Oropharynx- clear Neck- supple, no bruits Lungs- Clear to ausculation bilaterally, normal work of breathing Heart-irrr GI- soft, NT, ND, + BS Extremities- no clubbing, cyanosis, or edema Neuro- strength and sensation are intact  EKG today reveals afib, V rate 91bpm 02/07/15; BUN/Creat 13/0.88 (Calc Cr. Cl = 44.82), H/H 12/40 11/10/09: Echo with EF 50-55%, mild MR, LA 4.5cm  Assessment and Plan:  1. afib Doing very well with rate control  If her afib symptoms progress then our options would be tikosyn (prefered), return to amiodarone, or repeat ablation.  Given her advanced age and lack of symptoms, I would prefer rate control at this time. Continue anticoagulation with xarelto.   2. Symptomatic sinus bradycardia V rates are remain stable in afib  3. HTN     Appears well controlled  Follow-up with Roderic Palau NP in the AF clinic every 6 months I will see when needed.

## 2015-04-26 NOTE — Patient Instructions (Addendum)
Medication Instructions:  Your physician recommends that you continue on your current medications as directed. Please refer to the Current Medication list given to you today.   Labwork: None ordered  Testing/Procedures: None ordered  Follow-Up: Your physician wants you to follow-up in: 6 months with Donna Carroll, NP You will receive a reminder letter in the mail two months in advance. If you don't receive a letter, please call our office to schedule the follow-up appointment.   Any Other Special Instructions Will Be Listed Below (If Applicable).   

## 2015-04-28 NOTE — Addendum Note (Signed)
Addended by: Freada Bergeron on: 04/28/2015 06:04 PM   Modules accepted: Orders

## 2015-05-26 ENCOUNTER — Other Ambulatory Visit: Payer: Self-pay | Admitting: *Deleted

## 2015-05-26 MED ORDER — RIVAROXABAN 15 MG PO TABS: ORAL_TABLET | ORAL | Status: DC

## 2015-05-27 ENCOUNTER — Ambulatory Visit (INDEPENDENT_AMBULATORY_CARE_PROVIDER_SITE_OTHER): Payer: Medicare PPO | Admitting: Family Medicine

## 2015-05-27 VITALS — BP 112/71 | HR 100 | Temp 98.2°F | Ht 62.5 in | Wt 127.8 lb

## 2015-05-27 DIAGNOSIS — J40 Bronchitis, not specified as acute or chronic: Secondary | ICD-10-CM | POA: Diagnosis not present

## 2015-05-27 DIAGNOSIS — J329 Chronic sinusitis, unspecified: Secondary | ICD-10-CM | POA: Diagnosis not present

## 2015-05-27 MED ORDER — LEVOFLOXACIN 500 MG PO TABS: 500.0000 mg | ORAL_TABLET | Freq: Every day | ORAL | Status: DC

## 2015-05-27 MED ORDER — BETAMETHASONE SOD PHOS & ACET 6 (3-3) MG/ML IJ SUSP
6.0000 mg | Freq: Once | INTRAMUSCULAR | Status: AC
  Administered 2015-05-27: 6 mg via INTRAMUSCULAR

## 2015-05-27 MED ORDER — HYDROCODONE-HOMATROPINE 5-1.5 MG/5ML PO SYRP: 5.0000 mL | ORAL_SOLUTION | Freq: Four times a day (QID) | ORAL | Status: DC | PRN

## 2015-05-27 NOTE — Progress Notes (Signed)
Subjective:  Patient ID: [name], female    DOB: 1930-10-03  Age: 81 y.o. MRN: [id number]  CC: URI   HPI REYNALDA CANNY presents for congestion in the head and chest. Cough is productive of green sputum. She is having postnasal drainage and clear rhinirrhea. Denies shortness of breath. Onset 2-4 days ago, but has been off and on for 3 months. Has been treated for it 3 times  since early August. Woke up in a sweat last night. Intermittent subjective fever.  History Adamae has a past medical history of Hyperlipidemia; Microcytic anemia; Osteopenia; AV malformation of gastrointestinal tract; Persistent atrial fibrillation; Sinus bradycardia; CVA (cerebral vascular accident) (2007); Arthritis; Dizziness; Breast mass, right; Cataract; and RLS (restless legs syndrome).   She has past surgical history that includes breast mass resected; afib ablation (04/2010); Cardioversion; Cataract extraction w/PHACO (11/11/2011); Cataract extraction w/PHACO (11/25/2011); and dental inplants.   Her family history includes Hip fracture in her mother; Lung cancer (age of onset: 18) in her father; Stroke (age of onset: 73) in her mother. There is no history of Anesthesia problems, Hypotension, Malignant hyperthermia, or Pseudochol deficiency.She reports that she has quit smoking. Her smoking use included Cigarettes. She has a 30 pack-year smoking history. She has never used smokeless tobacco. She reports that she does not drink alcohol or use illicit drugs.  Outpatient Prescriptions Prior to Visit  Medication Sig Dispense Refill  . atorvastatin (LIPITOR) 10 MG tablet Take 5 mg by mouth daily.     . Calcium Carbonate-Vit D-Min 1200-1000 MG-UNIT CHEW Chew 1 tablet by mouth daily.    . chlorhexidine (PERIDEX) 0.12 % solution Mouthwash - Use as directed    . Cholecalciferol (VITAMIN D3) 1000 UNITS CAPS Take 1 capsule by mouth daily.    Marland Kitchen diltiazem (CARDIZEM CD) 180 MG 24 hr capsule TAKE 1 CAPSULE  (180 MG TOTAL) BY MOUTH DAILY. 90 capsule 2  . folic acid (FOLVITE) 1 MG tablet Take 1 mg by mouth daily.    . Magnesium 500 MG TABS Take 1 tablet by mouth daily.    . Multiple Vitamin (MULTIVITAMIN) capsule Take 1 capsule by mouth daily.      . Rivaroxaban (XARELTO) 15 MG TABS tablet TAKE 1 TABLET (15 MG TOTAL) BY MOUTH DAILY. 30 tablet 5   No facility-administered medications prior to visit.    ROS Review of Systems  Constitutional: Negative for fever, chills, activity change and appetite change.  HENT: Positive for congestion, postnasal drip and rhinorrhea. Negative for ear discharge, ear pain, hearing loss, nosebleeds, sinus pressure, sneezing and trouble swallowing.   Respiratory: Positive for cough. Negative for chest tightness and shortness of breath.   Cardiovascular: Negative for chest pain and palpitations.  Skin: Negative for rash.    Objective:  BP 112/71 mmHg  Pulse 100  Temp(Src) 98.2 F (36.8 C) (Oral)  Ht 5' 2.5" (1.588 m)  Wt 127 lb 12.8 oz (57.97 kg)  BMI 22.99 kg/m2  BP Readings from Last 3 Encounters:  05/27/15 112/71  04/26/15 122/76  04/14/15 110/76    Wt Readings from Last 3 Encounters:  05/27/15 127 lb 12.8 oz (57.97 kg)  04/26/15 127 lb 12.8 oz (57.97 kg)  04/14/15 125 lb (56.7 kg)     Physical Exam  Constitutional: She is oriented to person, place, and time. She appears well-developed and well-nourished. No distress.  HENT:  Head: Normocephalic and atraumatic.  Right Ear: External ear normal. No middle ear effusion.  Left Ear: External ear  normal. A middle ear effusion is present.  Nose: Right sinus exhibits maxillary sinus tenderness. Right sinus exhibits no frontal sinus tenderness. Left sinus exhibits no maxillary sinus tenderness and no frontal sinus tenderness.  Mouth/Throat: Oropharynx is clear and moist. No oropharyngeal exudate or posterior oropharyngeal erythema.  Eyes: Conjunctivae and EOM are normal. Pupils are equal, round, and  reactive to light.  Neck: Normal range of motion. Neck supple. No thyromegaly present.  Cardiovascular: Normal rate, regular rhythm and normal heart sounds.   No murmur heard. Pulmonary/Chest: Effort normal and breath sounds normal. No respiratory distress. She has no wheezes. She has no rales.  Musculoskeletal: Normal range of motion.  Lymphadenopathy:    She has no cervical adenopathy.  Neurological: She is alert and oriented to person, place, and time. She has normal reflexes.  Skin: Skin is warm and dry.  Psychiatric: She has a normal mood and affect. Her behavior is normal. Judgment and thought content normal.    No results found for: HGBA1C  Lab Results  Component Value Date   WBC 8.2 02/07/2015   HGB 12.9 02/07/2015   HCT 40.3 02/07/2015   PLT 238 07/02/2013   GLUCOSE 87 02/07/2015   CHOL 152 02/07/2015   TRIG 67 02/07/2015   HDL 67 02/07/2015   LDLCALC 76 01/17/2014   ALT 9 02/07/2015   AST 19 02/07/2015   NA 140 02/07/2015   K 4.8 02/07/2015   CL 101 02/07/2015   CREATININE 0.88 02/07/2015   BUN 13 02/07/2015   CO2 27 02/07/2015   TSH 2.570 02/07/2015   INR 3.01* 04/18/2010    Mm Screening Breast Tomo Bilateral  06/28/2014  CLINICAL DATA:  Screening. EXAM: DIGITAL SCREENING BILATERAL MAMMOGRAM WITH 3D TOMO WITH CAD COMPARISON:  Previous exam(s). ACR Breast Density Category c: The breast tissue is heterogeneously dense, which may obscure small masses. FINDINGS: There are no findings suspicious for malignancy. Images were processed with CAD. IMPRESSION: No mammographic evidence of malignancy. A result letter of this screening mammogram will be mailed directly to the patient. RECOMMENDATION: Screening mammogram in one year. (Code:SM-B-01Y) BI-RADS CATEGORY  1: Negative. Electronically Signed   By: Luberta Robertson M.D.   On: 06/28/2014 15:38    Assessment & Plan:   Mira was seen today for uri.  Diagnoses and all orders for this visit:  Sinobronchitis -      betamethasone acetate-betamethasone sodium phosphate (CELESTONE) injection 6 mg; Inject 1 mL (6 mg total) into the muscle once.  Other orders -     levofloxacin (LEVAQUIN) 500 MG tablet; Take 1 tablet (500 mg total) by mouth daily. -     HYDROcodone-homatropine (HYCODAN) 5-1.5 MG/5ML syrup; Take 5 mLs by mouth every 6 (six) hours as needed for cough.   I am having Ms. [name] start on levofloxacin and HYDROcodone-homatropine. I am also having her maintain her multivitamin, Calcium Carbonate-Vit D-Min, Magnesium, Vitamin D3, chlorhexidine, folic acid, diltiazem, atorvastatin, and Rivaroxaban. We administered betamethasone acetate-betamethasone sodium phosphate.  Meds ordered this encounter  Medications  . betamethasone acetate-betamethasone sodium phosphate (CELESTONE) injection 6 mg    Sig:   . levofloxacin (LEVAQUIN) 500 MG tablet    Sig: Take 1 tablet (500 mg total) by mouth daily.    Dispense:  7 tablet    Refill:  0  . HYDROcodone-homatropine (HYCODAN) 5-1.5 MG/5ML syrup    Sig: Take 5 mLs by mouth every 6 (six) hours as needed for cough.    Dispense:  120 mL  Refill:  0     Follow-up: Return if symptoms worsen or fail to improve.  Claretta Fraise, M.D.

## 2015-06-01 ENCOUNTER — Other Ambulatory Visit: Payer: Self-pay | Admitting: Family Medicine

## 2015-06-21 ENCOUNTER — Other Ambulatory Visit: Payer: Self-pay

## 2015-06-21 DIAGNOSIS — Z1231 Encounter for screening mammogram for malignant neoplasm of breast: Secondary | ICD-10-CM

## 2015-06-22 ENCOUNTER — Encounter: Payer: Self-pay | Admitting: Family Medicine

## 2015-06-22 ENCOUNTER — Ambulatory Visit (INDEPENDENT_AMBULATORY_CARE_PROVIDER_SITE_OTHER): Payer: Medicare PPO | Admitting: Family Medicine

## 2015-06-22 VITALS — BP 120/85 | HR 52 | Temp 97.0°F | Ht 62.5 in | Wt 126.0 lb

## 2015-06-22 DIAGNOSIS — R059 Cough, unspecified: Secondary | ICD-10-CM

## 2015-06-22 DIAGNOSIS — I4819 Other persistent atrial fibrillation: Secondary | ICD-10-CM

## 2015-06-22 DIAGNOSIS — I481 Persistent atrial fibrillation: Secondary | ICD-10-CM | POA: Diagnosis not present

## 2015-06-22 DIAGNOSIS — J9801 Acute bronchospasm: Secondary | ICD-10-CM

## 2015-06-22 DIAGNOSIS — E559 Vitamin D deficiency, unspecified: Secondary | ICD-10-CM

## 2015-06-22 DIAGNOSIS — E079 Disorder of thyroid, unspecified: Secondary | ICD-10-CM | POA: Diagnosis not present

## 2015-06-22 DIAGNOSIS — E785 Hyperlipidemia, unspecified: Secondary | ICD-10-CM | POA: Diagnosis not present

## 2015-06-22 DIAGNOSIS — R05 Cough: Secondary | ICD-10-CM | POA: Diagnosis not present

## 2015-06-22 NOTE — Progress Notes (Signed)
Subjective:    Patient ID: [name], female    DOB: 08-Jun-1931, 81 y.o.   MRN: [id number]  HPI Pt here for follow up and management of chronic medical problems which includes hyperlipidemia and atrial fibrillation. She is taking medications regularly. The patient sees the cardiologist regularly for her atrial fibrillation. She has no specific complaints other than some cough and congestion today. The patient has had several rounds of antibiotics and a round of prednisone. She still has somewhat of a upper airway cough. She's not coughing up any sputum presently. She keeps the house as cool as possible. She is trying to drink plenty of fluids. She denies chest pain or shortness of breath. She does have some shortness of breath with exertion and she attributes this to her slower heart rate at times. She does see the cardiologist regularly. She has an appointment with him again in the spring. She denies any trouble swallowing heartburn indigestion and nausea vomiting diarrhea or blood in the stool is passing her water without problems.     Patient Active Problem List   Diagnosis Date Noted  . AV malformation of gastrointestinal tract   . Acute bronchitis 04/06/2015  . Primary osteoarthritis involving multiple joints 02/07/2015  . High risk medication use 06/21/2013  . Dizziness   . Restless leg syndrome 03/01/2013  . ATRIAL FIBRILLATION 11/03/2008  . Hyperlipidemia 10/24/2008  . ANEMIA, IRON DEFICIENCY, MICROCYTIC 10/24/2008  . SINUS BRADYCARDIA 10/24/2008  . CVA 10/24/2008  . OSTEOPENIA 10/24/2008   Outpatient Encounter Prescriptions as of 06/22/2015  Medication Sig  . atorvastatin (LIPITOR) 10 MG tablet TAKE 1 TABLET BY MOUTH ONCE A DAY OR AS INSTRUCTED BY PHYSICIAN  . Calcium Carbonate-Vit D-Min 1200-1000 MG-UNIT CHEW Chew 1 tablet by mouth daily.  . chlorhexidine (PERIDEX) 0.12 % solution Mouthwash - Use as directed  . Cholecalciferol (VITAMIN D3) 1000 UNITS CAPS Take  1 capsule by mouth daily.  Marland Kitchen diltiazem (CARDIZEM CD) 180 MG 24 hr capsule TAKE 1 CAPSULE (180 MG TOTAL) BY MOUTH DAILY.  . folic acid (FOLVITE) 1 MG tablet Take 1 mg by mouth daily.  Marland Kitchen HYDROcodone-homatropine (HYCODAN) 5-1.5 MG/5ML syrup Take 5 mLs by mouth every 6 (six) hours as needed for cough.  Marland Kitchen levofloxacin (LEVAQUIN) 500 MG tablet Take 1 tablet (500 mg total) by mouth daily.  . Magnesium 500 MG TABS Take 1 tablet by mouth daily.  . Multiple Vitamin (MULTIVITAMIN) capsule Take 1 capsule by mouth daily.    . Rivaroxaban (XARELTO) 15 MG TABS tablet TAKE 1 TABLET (15 MG TOTAL) BY MOUTH DAILY.   No facility-administered encounter medications on file as of 06/22/2015.      Review of Systems  Constitutional: Negative.   HENT: Positive for congestion.   Eyes: Negative.   Respiratory: Positive for cough.   Cardiovascular: Negative.   Gastrointestinal: Negative.   Endocrine: Negative.   Genitourinary: Negative.   Musculoskeletal: Negative.   Skin: Negative.   Allergic/Immunologic: Negative.   Neurological: Negative.   Hematological: Negative.   Psychiatric/Behavioral: Negative.        Objective:   Physical Exam  Constitutional: She is oriented to person, place, and time. She appears well-developed and well-nourished.  Small framed but healthy and alert  HENT:  Head: Normocephalic and atraumatic.  Right Ear: External ear normal.  Left Ear: External ear normal.  Mouth/Throat: Oropharynx is clear and moist.  Slight nasal congestion bilaterally  Eyes: Conjunctivae and EOM are normal. Pupils are equal, round, and reactive to  light. Right eye exhibits no discharge. Left eye exhibits no discharge. No scleral icterus.  Neck: Normal range of motion. Neck supple. No JVD present. No thyromegaly present.  Neck without bruits or thyromegaly  Cardiovascular: Normal rate and normal heart sounds.   No murmur heard. Distal pulses were difficult to palpate The heart is irregular irregular at  84/m  Pulmonary/Chest: Effort normal and breath sounds normal. No respiratory distress. She has no wheezes. She has no rales. She exhibits no tenderness.  Dry cough  Abdominal: Soft. Bowel sounds are normal. She exhibits no mass. There is no tenderness. There is no rebound and no guarding.  Musculoskeletal: Normal range of motion. She exhibits no edema or tenderness.  Lymphadenopathy:    She has no cervical adenopathy.  Neurological: She is alert and oriented to person, place, and time. She has normal reflexes. No cranial nerve deficit.  Skin: Skin is warm and dry. No rash noted.  Psychiatric: She has a normal mood and affect. Her behavior is normal. Judgment and thought content normal.  Nursing note and vitals reviewed.   BP 120/85 mmHg  Pulse 52  Temp(Src) 97 F (36.1 C) (Oral)  Ht 5' 2.5" (1.588 m)  Wt 126 lb (57.153 kg)  BMI 22.66 kg/m2       Assessment & Plan:  1. Hyperlipidemia -Continue current treatment pending results of lab work - BMP8+EGFR - CBC with Differential/Platelet - Hepatic function panel - NMR, lipoprofile  2. Vitamin D deficiency -Continue current treatment pending results of lab work - CBC with Differential/Platelet - VITAMIN D 25 Hydroxy (Vit-D Deficiency, Fractures)  3. Thyroid disease -The patient does complain of fatigue and we will make sure her thyroid profile is okay. - CBC with Differential/Platelet - Thyroid Panel With TSH  4. Persistent atrial fibrillation (HCC) -Continue to follow-up with cardiology - BMP8+EGFR - CBC with Differential/Platelet - Hepatic function panel  5. Cough -Take Mucinex twice daily with a large glass of water and use nasal saline  6. Bronchospasm -Use Brio inhaler 1 puff once daily and rinse mouth after use  Patient Instructions                       Medicare Annual Wellness Visit  [facility] and the medical providers at [facility] strive to bring you the best medical care.  In  doing so we not only want to address your current medical conditions and concerns but also to detect new conditions early and prevent illness, disease and health-related problems.    Medicare offers a yearly Wellness Visit which allows our clinical staff to assess your need for preventative services including immunizations, lifestyle education, counseling to decrease risk of preventable diseases and screening for fall risk and other medical concerns.    This visit is provided free of charge (no copay) for all Medicare recipients. The clinical pharmacists at [facility] have begun to conduct these Wellness Visits which will also include a thorough review of all your medications.    As you primary medical provider recommend that you make an appointment for your Annual Wellness Visit if you have not done so already this year.  You may set up this appointment before you leave today or you may call back [phone]) and schedule an appointment.  Please make sure when you call that you mention that you are scheduling your Annual Wellness Visit with the clinical pharmacist so that the appointment may be made for the proper  length of time.     Continue current medications. Continue good therapeutic lifestyle changes which include good diet and exercise. Fall precautions discussed with patient. If an FOBT was given today- please return it to our front desk. If you are over 28 years old - you may need Prevnar 61 or the adult Pneumonia vaccine.  **Flu shots are available--- please call and schedule a FLU-CLINIC appointment**  After your visit with [place] today you will receive a survey in the mail or online from [employer] regarding your care with [place]. Please take a moment to fill this out. Your feedback is very important to [place] as you can help [place] better understand your patient needs as well as improve your experience and satisfaction. WE CARE ABOUT YOU!!!   Continue to drink plenty of fluids  and stay well hydrated Use nasal saline 3 or 4 times daily each nostril Use Mucinex, maximum strength, blue and white in color, 1 twice daily for cough and congestion and take this with a large glass of water Keep the house as cool as possible We will call with the lab work results as soon as those results become available Return the FOBT card Wait a couple weeks and come back and get your flu shot   Arrie Senate MD

## 2015-06-22 NOTE — Addendum Note (Signed)
Addended by: Earlene Plater on: 06/22/2015 10:46 AM   Modules accepted: Miquel Dunn

## 2015-06-22 NOTE — Patient Instructions (Addendum)
Medicare Annual Wellness Visit  [facility] and the medical providers at [facility] strive to bring you the best medical care.  In doing so we not only want to address your current medical conditions and concerns but also to detect new conditions early and prevent illness, disease and health-related problems.    Medicare offers a yearly Wellness Visit which allows our clinical staff to assess your need for preventative services including immunizations, lifestyle education, counseling to decrease risk of preventable diseases and screening for fall risk and other medical concerns.    This visit is provided free of charge (no copay) for all Medicare recipients. The clinical pharmacists at [facility] have begun to conduct these Wellness Visits which will also include a thorough review of all your medications.    As you primary medical provider recommend that you make an appointment for your Annual Wellness Visit if you have not done so already this year.  You may set up this appointment before you leave today or you may call back [phone] and schedule an appointment.  Please make sure when you call that you mention that you are scheduling your Annual Wellness Visit with the clinical pharmacist so that the appointment may be made for the proper length of time.     Continue current medications. Continue good therapeutic lifestyle changes which include good diet and exercise. Fall precautions discussed with patient. If an FOBT was given today- please return it to our front desk. If you are over 4 years old - you may need Prevnar 27 or the adult Pneumonia vaccine.  **Flu shots are available--- please call and schedule a FLU-CLINIC appointment**  After your visit with [place] today you will receive a survey in the mail or online from [employer] regarding your care with [place]. Please take a moment to fill this out. Your feedback is very  important to [place] as you can help [place] better understand your patient needs as well as improve your experience and satisfaction. WE CARE ABOUT YOU!!!   Continue to drink plenty of fluids and stay well hydrated Use nasal saline 3 or 4 times daily each nostril Use Mucinex, maximum strength, blue and white in color, 1 twice daily for cough and congestion and take this with a large glass of water Keep the house as cool as possible We will call with the lab work results as soon as those results become available Return the FOBT card Wait a couple weeks and come back and get your flu shot

## 2015-06-23 LAB — NMR, LIPOPROFILE
Cholesterol: 177 mg/dL (ref 100–199)
HDL CHOLESTEROL BY NMR: 79 mg/dL (ref >39)
HDL PARTICLE NUMBER: 41.9 umol/L (ref >=30.5)
LDL PARTICLE NUMBER: 760 nmol/L (ref <1000)
LDL Size: 21 nm (ref >20.5)
LDL-C: 80 mg/dL (ref 0–99)
LP-IR SCORE: 25 (ref <=45)
SMALL LDL PARTICLE NUMBER: 209 nmol/L (ref <=527)
Triglycerides by NMR: 91 mg/dL (ref 0–149)

## 2015-06-23 LAB — BMP8+EGFR
BUN / CREAT RATIO: 13 (ref 11–26)
BUN: 13 mg/dL (ref 8–27)
CO2: 25 mmol/L (ref 18–29)
Calcium: 10 mg/dL (ref 8.7–10.3)
Chloride: 102 mmol/L (ref 97–106)
Creatinine, Ser: 1.03 mg/dL — ABNORMAL HIGH (ref 0.57–1.00)
GFR, EST AFRICAN AMERICAN: 58 mL/min/{1.73_m2} — AB (ref >59)
GFR, EST NON AFRICAN AMERICAN: 51 mL/min/{1.73_m2} — AB (ref >59)
Glucose: 88 mg/dL (ref 65–99)
POTASSIUM: 4.9 mmol/L (ref 3.5–5.2)
SODIUM: 143 mmol/L (ref 136–144)

## 2015-06-23 LAB — CBC WITH DIFFERENTIAL/PLATELET
BASOS ABS: 0 10*3/uL (ref 0.0–0.2)
BASOS: 1 %
EOS (ABSOLUTE): 0.1 10*3/uL (ref 0.0–0.4)
Eos: 1 %
HEMOGLOBIN: 13.7 g/dL (ref 11.1–15.9)
Hematocrit: 41.3 % (ref 34.0–46.6)
Immature Grans (Abs): 0 10*3/uL (ref 0.0–0.1)
Immature Granulocytes: 0 %
LYMPHS ABS: 2.1 10*3/uL (ref 0.7–3.1)
Lymphs: 26 %
MCH: 31.5 pg (ref 26.6–33.0)
MCHC: 33.2 g/dL (ref 31.5–35.7)
MCV: 95 fL (ref 79–97)
Monocytes Absolute: 0.9 10*3/uL (ref 0.1–0.9)
Monocytes: 12 %
NEUTROS ABS: 5 10*3/uL (ref 1.4–7.0)
Neutrophils: 60 %
PLATELETS: 330 10*3/uL (ref 150–379)
RBC: 4.35 x10E6/uL (ref 3.77–5.28)
RDW: 14.9 % (ref 12.3–15.4)
WBC: 8.1 10*3/uL (ref 3.4–10.8)

## 2015-06-23 LAB — THYROID PANEL WITH TSH
FREE THYROXINE INDEX: 2.4 (ref 1.2–4.9)
T3 Uptake Ratio: 29 % (ref 24–39)
T4 TOTAL: 8.2 ug/dL (ref 4.5–12.0)
TSH: 2.83 u[IU]/mL (ref 0.450–4.500)

## 2015-06-23 LAB — HEPATIC FUNCTION PANEL
ALK PHOS: 80 IU/L (ref 39–117)
ALT: 16 IU/L (ref 0–32)
AST: 26 IU/L (ref 0–40)
Albumin: 4.2 g/dL (ref 3.5–4.7)
Bilirubin Total: 0.7 mg/dL (ref 0.0–1.2)
Bilirubin, Direct: 0.19 mg/dL (ref 0.00–0.40)
TOTAL PROTEIN: 7.2 g/dL (ref 6.0–8.5)

## 2015-06-23 LAB — VITAMIN D 25 HYDROXY (VIT D DEFICIENCY, FRACTURES): VIT D 25 HYDROXY: 64.5 ng/mL (ref 30.0–100.0)

## 2015-06-26 ENCOUNTER — Other Ambulatory Visit: Payer: Medicare PPO

## 2015-06-26 DIAGNOSIS — Z1212 Encounter for screening for malignant neoplasm of rectum: Secondary | ICD-10-CM

## 2015-06-26 NOTE — Progress Notes (Signed)
Lab only 

## 2015-07-02 LAB — FECAL OCCULT BLOOD, IMMUNOCHEMICAL: Fecal Occult Bld: NEGATIVE

## 2015-07-24 ENCOUNTER — Ambulatory Visit: Payer: Medicare PPO

## 2015-07-26 ENCOUNTER — Ambulatory Visit: Payer: Medicare PPO

## 2015-08-01 ENCOUNTER — Ambulatory Visit
Admission: RE | Admit: 2015-08-01 | Discharge: 2015-08-01 | Disposition: A | Discharge status: Home / Self Care | Payer: Medicare PPO | Source: Ambulatory Visit

## 2015-08-01 DIAGNOSIS — Z1231 Encounter for screening mammogram for malignant neoplasm of breast: Secondary | ICD-10-CM | POA: Diagnosis not present

## 2015-08-08 ENCOUNTER — Telehealth: Payer: Self-pay | Admitting: Family Medicine

## 2015-08-08 NOTE — Telephone Encounter (Signed)
scheduled

## 2015-08-14 ENCOUNTER — Telehealth: Payer: Self-pay | Admitting: Internal Medicine

## 2015-08-14 NOTE — Telephone Encounter (Signed)
New message      Need prior approval for xarelto 15mg .  Please call [phone]

## 2015-08-15 ENCOUNTER — Telehealth: Payer: Self-pay

## 2015-08-15 ENCOUNTER — Ambulatory Visit: Payer: Medicare PPO

## 2015-08-15 NOTE — Telephone Encounter (Signed)
Spoke with patient today. She has changed Insurance and I don't have a copy of her card. Will wait for pharmacy to contact us.

## 2015-08-21 ENCOUNTER — Ambulatory Visit: Payer: Medicare PPO

## 2015-08-24 ENCOUNTER — Ambulatory Visit (INDEPENDENT_AMBULATORY_CARE_PROVIDER_SITE_OTHER): Payer: Medicare Other | Admitting: *Deleted

## 2015-08-24 DIAGNOSIS — Z23 Encounter for immunization: Secondary | ICD-10-CM

## 2015-08-25 ENCOUNTER — Telehealth: Payer: Self-pay

## 2015-08-25 NOTE — Telephone Encounter (Signed)
Prior auth for Xarelto 15mg sent to [facility]. 

## 2015-08-28 ENCOUNTER — Telehealth: Payer: Self-pay

## 2015-08-28 NOTE — Telephone Encounter (Signed)
Xarelto approved through 08/04/2016. [place] [id number].

## 2015-08-28 NOTE — Telephone Encounter (Signed)
PA done for Xarelto

## 2015-09-12 ENCOUNTER — Ambulatory Visit (INDEPENDENT_AMBULATORY_CARE_PROVIDER_SITE_OTHER): Payer: Medicare Other | Admitting: Family Medicine

## 2015-09-12 ENCOUNTER — Encounter: Payer: Self-pay | Admitting: Family Medicine

## 2015-09-12 ENCOUNTER — Ambulatory Visit (INDEPENDENT_AMBULATORY_CARE_PROVIDER_SITE_OTHER): Payer: Medicare Other

## 2015-09-12 VITALS — BP 134/77 | HR 106 | Temp 97.1°F | Ht 62.5 in | Wt 131.0 lb

## 2015-09-12 DIAGNOSIS — M25561 Pain in right knee: Secondary | ICD-10-CM | POA: Diagnosis not present

## 2015-09-12 NOTE — Progress Notes (Signed)
Subjective:    Patient ID: [name], female    DOB: [birth year], 82 y.o.   MRN: [id number]  HPI Patient here today for right knee pain. There has been no injury. This knee has been painful for the last 3-4 weeks. Patient does dancing and exercise class. There is been no swelling no locking or giving way. Climbing stairs seems to be a little more troublesome. She has found [place] and narrow that if she goes up with her good leg and down with the bad leg that it is easier.      Patient Active Problem List   Diagnosis Date Noted  . AV malformation of gastrointestinal tract   . Acute bronchitis 04/06/2015  . Primary osteoarthritis involving multiple joints 02/07/2015  . High risk medication use 06/21/2013  . Dizziness   . Restless leg syndrome 03/01/2013  . ATRIAL FIBRILLATION 11/03/2008  . Hyperlipidemia 10/24/2008  . ANEMIA, IRON DEFICIENCY, MICROCYTIC 10/24/2008  . SINUS BRADYCARDIA 10/24/2008  . CVA 10/24/2008  . OSTEOPENIA 10/24/2008   Outpatient Encounter Prescriptions as of 09/12/2015  Medication Sig  . atorvastatin (LIPITOR) 10 MG tablet TAKE 1 TABLET BY MOUTH ONCE A DAY OR AS INSTRUCTED BY PHYSICIAN  . Calcium Carbonate-Vit D-Min 1200-1000 MG-UNIT CHEW Chew 1 tablet by mouth daily.  . chlorhexidine (PERIDEX) 0.12 % solution Mouthwash - Use as directed  . Cholecalciferol (VITAMIN D3) 1000 UNITS CAPS Take 1 capsule by mouth daily.  Marland Kitchen diltiazem (CARDIZEM CD) 180 MG 24 hr capsule TAKE 1 CAPSULE (180 MG TOTAL) BY MOUTH DAILY.  . folic acid (FOLVITE) 1 MG tablet Take 1 mg by mouth daily.  . Magnesium 500 MG TABS Take 1 tablet by mouth daily.  . Multiple Vitamin (MULTIVITAMIN) capsule Take 1 capsule by mouth daily.    . Rivaroxaban (XARELTO) 15 MG TABS tablet TAKE 1 TABLET (15 MG TOTAL) BY MOUTH DAILY.   No facility-administered encounter medications on file as of 09/12/2015.      Review of Systems  Constitutional: Negative.   HENT: Negative.   Eyes: Negative.     Respiratory: Negative.   Cardiovascular: Negative.   Gastrointestinal: Negative.   Endocrine: Negative.   Genitourinary: Negative.   Musculoskeletal: Positive for arthralgias (right knee pain).  Skin: Negative.   Allergic/Immunologic: Negative.   Neurological: Negative.   Hematological: Negative.   Psychiatric/Behavioral: Negative.        Objective:   Physical Exam  Constitutional: She appears well-developed and well-nourished.  Musculoskeletal:  Right knee there is a little more swelling on the right knee when compared to the left. I can detect no crepitance. The knee is stable to stress testing. There is some tenderness laterally along the lateral joint line. X-ray shows normal space between the tibia and fit and femur and I do not see any arthritis in general.    BP 134/77 mmHg  Pulse 106  Temp(Src) 97.1 F (36.2 C) (Oral)  Ht 5' 2.5" (1.588 m)  Wt 131 lb (59.421 kg)  BMI 23.56 kg/m2       Assessment & Plan:  1. Right knee pain I think the pain is caused by overuse. When she does tap dancing there is a fair amount of pressure on the joint. I cannot make a good argument for strain tear or arthritis. I have suggested she try nonweightbearing exercise like recumbent bike her walking in the pool if accessible. If not continued dancing but without the tap. She is on Xarelto for her atrial fib so we  cannot use anti-inflammatories. I think extra strength Tylenol is medicine of choice for discomfort.  Wardell Honour MD - DG Knee 1-2 Views Right; Future

## 2015-10-24 ENCOUNTER — Encounter (HOSPITAL_COMMUNITY): Payer: Self-pay | Admitting: Nurse Practitioner

## 2015-10-24 ENCOUNTER — Ambulatory Visit (HOSPITAL_COMMUNITY)
Admission: RE | Admit: 2015-10-24 | Discharge: 2015-10-24 | Disposition: A | Discharge status: Home / Self Care | Payer: Medicare Other | Source: Ambulatory Visit | Attending: Nurse Practitioner | Admitting: Nurse Practitioner

## 2015-10-24 VITALS — BP 110/72 | HR 85 | Ht 62.5 in | Wt 130.6 lb

## 2015-10-24 DIAGNOSIS — I4891 Unspecified atrial fibrillation: Secondary | ICD-10-CM | POA: Insufficient documentation

## 2015-10-24 DIAGNOSIS — I482 Chronic atrial fibrillation, unspecified: Secondary | ICD-10-CM

## 2015-10-24 MED ORDER — DILTIAZEM HCL ER COATED BEADS 180 MG PO CP24: ORAL_CAPSULE | ORAL | Status: DC

## 2015-10-24 MED ORDER — RIVAROXABAN 15 MG PO TABS: ORAL_TABLET | ORAL | Status: DC

## 2015-10-24 NOTE — Progress Notes (Signed)
Patient ID: [name], female   DOB: 1931/07/28, 82 y.o.   MRN: [id number]     Primary Care Physician: Lisa Gainer, MD Referring Physician: Dr. Geraldo Docker [name] is a 82 y.o. female with a h/o chronic afib. She is in the afib clinic for f/u and reports that she is doing the activities that she enjoys and does not notice any afib. She currently is not line dancing due to rt knee soreness but plans to get back to it soon. She is on xarelto without bleeding issues.  Today, she denies symptoms of palpitations, chest pain, shortness of breath, orthopnea, PND, lower extremity edema, dizziness, presyncope, syncope, or neurologic sequela. The patient is tolerating medications without difficulties and is otherwise without complaint today.   Past Medical History  Diagnosis Date  . Hyperlipidemia   . Microcytic anemia   . Osteopenia   . AV malformation of gastrointestinal tract     in cecum she denies history of GIB  . Persistent atrial fibrillation ([name])     s/p PVI 04/2010  CHADS2vasc at least 5  . Sinus bradycardia   . CVA (cerebral vascular accident) ([name]) 2007  . Arthritis   . Dizziness     denies this as an ongoing problem  . Breast mass, right   . Cataract   . RLS (restless legs syndrome)    Past Surgical History  Procedure Laterality Date  . Breast mass resected    . Afib ablation  04/2010    PVI with CTI ablation performed by [facility]  . Cardioversion      for afib  . Cataract extraction w/phaco  11/11/2011    Procedure: CATARACT EXTRACTION PHACO AND INTRAOCULAR LENS PLACEMENT (IOC);  Surgeon: Tonny Branch, MD;  Location: [facility];  Service: Ophthalmology;  Laterality: Left;  CDE:12.62  . Cataract extraction w/phaco  11/25/2011    Procedure: CATARACT EXTRACTION PHACO AND INTRAOCULAR LENS PLACEMENT (IOC);  Surgeon: Tonny Branch, MD;  Location: [facility];  Service: Ophthalmology;  Laterality: Right;  CDE 16.14  . Dental inplants      Current Outpatient Prescriptions    Medication Sig Dispense Refill  . atorvastatin (LIPITOR) 10 MG tablet TAKE 1 TABLET BY MOUTH ONCE A DAY OR AS INSTRUCTED BY PHYSICIAN 30 tablet 2  . Calcium Carbonate-Vit D-Min 1200-1000 MG-UNIT CHEW Chew 1 tablet by mouth daily.    . chlorhexidine (PERIDEX) 0.12 % solution Mouthwash - Use as directed    . Cholecalciferol (VITAMIN D3) 1000 UNITS CAPS Take 1 capsule by mouth daily.    Marland Kitchen diltiazem (CARDIZEM CD) 180 MG 24 hr capsule TAKE 1 CAPSULE (180 MG TOTAL) BY MOUTH DAILY. 90 capsule 3  . folic acid (FOLVITE) 1 MG tablet Take 1 mg by mouth daily.    . Magnesium 500 MG TABS Take 1 tablet by mouth daily.    . Multiple Vitamin (MULTIVITAMIN) capsule Take 1 capsule by mouth daily.      . Rivaroxaban (XARELTO) 15 MG TABS tablet TAKE 1 TABLET (15 MG TOTAL) BY MOUTH DAILY. 30 tablet 6   No current facility-administered medications for this encounter.    No Known Allergies  Social History   Social History  . Marital Status: Widowed    Spouse Name: N/A  . Number of Children: 0  . Years of Education: college   Occupational History  . retired    Social History Main Topics  . Smoking status: Former Smoker -- 1.00 packs/day for 30 years  Types: Cigarettes  . Smokeless tobacco: Never Used     Comment: quit in 1980s  . Alcohol Use: No  . Drug Use: No  . Sexual Activity: No   Other Topics Concern  . Not on file   Social History Narrative   Patient is retired and lives at home alone. Patient has business college education.    Right handed.   Caffeine- some times - soda     Family History  Problem Relation Age of Onset  . Stroke Mother 41  . Hip fracture Mother   . Anesthesia problems Neg Hx   . Hypotension Neg Hx   . Malignant hyperthermia Neg Hx   . Pseudochol deficiency Neg Hx   . Lung cancer Father 20    ROS- All systems are reviewed and negative except as per the HPI above  Physical Exam: Filed Vitals:   10/24/15 1402  BP: 110/72  Pulse: 85  Height: 5' 2.5"  (1.588 m)  Weight: 130 lb 9.6 oz (59.24 kg)    GEN- The patient is well appearing, alert and oriented x 3 today.   Head- normocephalic, atraumatic Eyes-  Sclera clear, conjunctiva pink Ears- hearing intact Oropharynx- clear Neck- supple, no JVP Lymph- no cervical lymphadenopathy Lungs- Clear to ausculation bilaterally, normal work of breathing Heart- Regular rate and rhythm, no murmurs, rubs or gallops, PMI not laterally displaced GI- soft, NT, ND, + BS Extremities- no clubbing, cyanosis, or edema MS- no significant deformity or atrophy Skin- no rash or lesion Psych- euthymic mood, full affect Neuro- strength and sensation are intact  EKG- afib at 85 bpm, qrs int 72 ms, qtc 433 ms Epic records reviewed  Assessment and Plan: 1. afib Chronic and rate controlled Doing well with minimal symptoms Continue diltiazem Continue xarelto   F/u in afib clinic q 6 months  Butch Penny C. Erendida Wrenn, [facility] [address] [place],  [address] [phone]

## 2015-11-08 ENCOUNTER — Ambulatory Visit (INDEPENDENT_AMBULATORY_CARE_PROVIDER_SITE_OTHER): Payer: Medicare Other | Admitting: Family Medicine

## 2015-11-08 ENCOUNTER — Encounter: Payer: Self-pay | Admitting: Family Medicine

## 2015-11-08 ENCOUNTER — Encounter (INDEPENDENT_AMBULATORY_CARE_PROVIDER_SITE_OTHER): Payer: Self-pay

## 2015-11-08 VITALS — BP 105/71 | HR 95 | Temp 97.0°F | Ht 62.5 in | Wt 129.0 lb

## 2015-11-08 DIAGNOSIS — E079 Disorder of thyroid, unspecified: Secondary | ICD-10-CM | POA: Diagnosis not present

## 2015-11-08 DIAGNOSIS — I480 Paroxysmal atrial fibrillation: Secondary | ICD-10-CM

## 2015-11-08 DIAGNOSIS — M25561 Pain in right knee: Secondary | ICD-10-CM

## 2015-11-08 DIAGNOSIS — W57XXXA Bitten or stung by nonvenomous insect and other nonvenomous arthropods, initial encounter: Secondary | ICD-10-CM

## 2015-11-08 DIAGNOSIS — I481 Persistent atrial fibrillation: Secondary | ICD-10-CM | POA: Diagnosis not present

## 2015-11-08 DIAGNOSIS — M25511 Pain in right shoulder: Secondary | ICD-10-CM

## 2015-11-08 DIAGNOSIS — E785 Hyperlipidemia, unspecified: Secondary | ICD-10-CM

## 2015-11-08 DIAGNOSIS — M19019 Primary osteoarthritis, unspecified shoulder: Secondary | ICD-10-CM

## 2015-11-08 DIAGNOSIS — E559 Vitamin D deficiency, unspecified: Secondary | ICD-10-CM

## 2015-11-08 DIAGNOSIS — S80862A Insect bite (nonvenomous), left lower leg, initial encounter: Secondary | ICD-10-CM | POA: Diagnosis not present

## 2015-11-08 DIAGNOSIS — M159 Polyosteoarthritis, unspecified: Secondary | ICD-10-CM

## 2015-11-08 DIAGNOSIS — M150 Primary generalized (osteo)arthritis: Secondary | ICD-10-CM

## 2015-11-08 DIAGNOSIS — I4819 Other persistent atrial fibrillation: Secondary | ICD-10-CM

## 2015-11-08 MED ORDER — DOXYCYCLINE HYCLATE 100 MG PO TABS: 100.0000 mg | ORAL_TABLET | Freq: Two times a day (BID) | ORAL | Status: DC

## 2015-11-08 NOTE — Patient Instructions (Addendum)
Medicare Annual Wellness Visit  [facility] and the medical providers at [facility] strive to bring you the best medical care.  In doing so we not only want to address your current medical conditions and concerns but also to detect new conditions early and prevent illness, disease and health-related problems.    Medicare offers a yearly Wellness Visit which allows our clinical staff to assess your need for preventative services including immunizations, lifestyle education, counseling to decrease risk of preventable diseases and screening for fall risk and other medical concerns.    This visit is provided free of charge (no copay) for all Medicare recipients. The clinical pharmacists at [facility] have begun to conduct these Wellness Visits which will also include a thorough review of all your medications.    As you primary medical provider recommend that you make an appointment for your Annual Wellness Visit if you have not done so already this year.  You may set up this appointment before you leave today or you may call back [phone] and schedule an appointment.  Please make sure when you call that you mention that you are scheduling your Annual Wellness Visit with the clinical pharmacist so that the appointment may be made for the proper length of time.     Continue current medications. Continue good therapeutic lifestyle changes which include good diet and exercise. Fall precautions discussed with patient. If an FOBT was given today- please return it to our front desk. If you are over 72 years old - you may need Prevnar 75 or the adult Pneumonia vaccine.  **Flu shots are available--- please call and schedule a FLU-CLINIC appointment**  After your visit with [place] today you will receive a survey in the mail or online from [employer] regarding your care with [place]. Please take a moment to fill this out. Your feedback is very  important to [place] as you can help [place] better understand your patient needs as well as improve your experience and satisfaction. WE CARE ABOUT YOU!!!   Use warm wet compresses to the right shoulder and gentle range of motion exercises Avoid heavy lifting pushing pulling for a few days We will make an appointment for you to see the orthopedic specialist regarding your knee Continue to be careful and do not put yourself at risk for falling Check yourself daily for tick bites and consider using deep Sherral Hammers off on your lower extremities especially if out working in the yard Follow-up as planned with cardiology

## 2015-11-08 NOTE — Progress Notes (Signed)
Subjective:    Patient ID: [name], female    DOB: 1930-10-09, 82 y.o.   MRN: [id number]  HPI Pt here for follow up and management of chronic medical problems which includes thyroid disease and a fib. She is taking medications regularly.The patient has had an right knee pain for about 3 months. She does not recall any injury. She is also had shoulder pain and this is been going on for 2-3 weeks. She does not recall any injury for this either. She has had x-rays of the knee and nothing significant was found. She denies any chest pain or shortness of breath. She sees a cardiologist regularly because of her atrophia. She's not having any problems with her GI tract including swallowing heartburn indigestion nausea vomiting diarrhea or blood in the stool. She is passing her water without problems other than she has nocturia 2 but no burning. She did remove a small deer tick from her left calf and popliteal area. This was about a week ago.     Patient Active Problem List   Diagnosis Date Noted  . AV malformation of gastrointestinal tract   . Acute bronchitis 04/06/2015  . Primary osteoarthritis involving multiple joints 02/07/2015  . High risk medication use 06/21/2013  . Dizziness   . Restless leg syndrome 03/01/2013  . ATRIAL FIBRILLATION 11/03/2008  . Hyperlipidemia 10/24/2008  . ANEMIA, IRON DEFICIENCY, MICROCYTIC 10/24/2008  . SINUS BRADYCARDIA 10/24/2008  . CVA 10/24/2008  . OSTEOPENIA 10/24/2008   Outpatient Encounter Prescriptions as of 11/08/2015  Medication Sig  . atorvastatin (LIPITOR) 10 MG tablet TAKE 1 TABLET BY MOUTH ONCE A DAY OR AS INSTRUCTED BY PHYSICIAN  . Calcium Carbonate-Vit D-Min 1200-1000 MG-UNIT CHEW Chew 1 tablet by mouth daily.  . chlorhexidine (PERIDEX) 0.12 % solution Mouthwash - Use as directed  . Cholecalciferol (VITAMIN D3) 1000 UNITS CAPS Take 1 capsule by mouth daily.  Marland Kitchen diltiazem (CARDIZEM CD) 180 MG 24 hr capsule TAKE 1 CAPSULE (180 MG  TOTAL) BY MOUTH DAILY.  . folic acid (FOLVITE) 1 MG tablet Take 1 mg by mouth daily.  . Magnesium 500 MG TABS Take 1 tablet by mouth daily.  . Multiple Vitamin (MULTIVITAMIN) capsule Take 1 capsule by mouth daily.    . Rivaroxaban (XARELTO) 15 MG TABS tablet TAKE 1 TABLET (15 MG TOTAL) BY MOUTH DAILY.   No facility-administered encounter medications on file as of 11/08/2015.      Review of Systems  Constitutional: Negative.   HENT: Negative.   Eyes: Negative.   Respiratory: Negative.   Cardiovascular: Negative.   Gastrointestinal: Negative.   Endocrine: Negative.   Genitourinary: Negative.   Musculoskeletal: Positive for arthralgias (right knee and shoulder).  Skin: Negative.   Allergic/Immunologic: Negative.   Neurological: Negative.   Hematological: Negative.   Psychiatric/Behavioral: Negative.        Objective:   Physical Exam  Constitutional: She is oriented to person, place, and time. She appears well-developed and well-nourished.  Alert and pleasant  HENT:  Head: Normocephalic and atraumatic.  Right Ear: External ear normal.  Left Ear: External ear normal.  Nose: Nose normal.  Mouth/Throat: Oropharynx is clear and moist. No oropharyngeal exudate.  Eyes: Conjunctivae and EOM are normal. Pupils are equal, round, and reactive to light. Right eye exhibits no discharge. Left eye exhibits no discharge. No scleral icterus.  Neck: Normal range of motion. Neck supple. No thyromegaly present.  Cardiovascular: Normal rate, normal heart sounds and intact distal pulses.   No murmur  heard. Irregular irregular at 96/m  Pulmonary/Chest: Effort normal and breath sounds normal. No respiratory distress. She has no wheezes. She has no rales. She exhibits no tenderness.  Clear anteriorly and posteriorly  Abdominal: Soft. Bowel sounds are normal. She exhibits no mass. There is no tenderness. There is no rebound and no guarding.  Nontender without masses or organ enlargement    Musculoskeletal: Normal range of motion. She exhibits edema and tenderness.  There is tenderness at the right before meals joint area. There is also tenderness at the bilateral joint line of the right knee and may be slightly more swelling in the right knee than the left knee.  Lymphadenopathy:    She has no cervical adenopathy.  Neurological: She is alert and oriented to person, place, and time. She has normal reflexes. No cranial nerve deficit.  Skin: Skin is warm and dry. No rash noted.  There is a small papular area at the site of the tick removal without any rash.  Psychiatric: She has a normal mood and affect. Her behavior is normal. Judgment and thought content normal.  Nursing note and vitals reviewed.  BP 105/71 mmHg  Pulse 95  Temp(Src) 97 F (36.1 C) (Oral)  Ht 5' 2.5" (1.588 m)  Wt 129 lb (58.514 kg)  BMI 23.20 kg/m2        Assessment & Plan:  1. Hyperlipidemia -Continue current treatment pending results of lab work - BMP8+EGFR; Future - CBC with Differential/Platelet; Future - Hepatic function panel; Future - NMR, lipoprofile; Future  2. Thyroid disease -Continue current treatment pending results of lab work - CBC with Differential/Platelet; Future - Thyroid Panel With TSH; Future  3. Vitamin D deficiency -Continue current treatment - CBC with Differential/Platelet; Future - VITAMIN D 25 Hydroxy (Vit-D Deficiency, Fractures); Future  4. Persistent atrial fibrillation ([place]) -Continue follow-up with cardiology - CBC with Differential/Platelet; Future  5. Primary osteoarthritis involving multiple joints -Because of the persistent knee pain we will arrange for a visit with the orthopedic surgeon when he comes to our office  6. Right knee pain - Ambulatory referral to Orthopedic Surgery  7. Tick bite of calf, left, initial encounter -Antibiotics take twice daily with food for 3 weeks  8. Acromioclavicular joint arthritis -There is tenderness at the  right acromioclavicular joint. 1/2 mL of Kenalog and 1/2 mL of Marcaine were injected into the right acromioclavicular joint area. Is was done without problems under sterile technique and the patient tolerated the procedure well. The arm felt better after the procedure.  9. Paroxysmal atrial fibrillation ([place]) -Follow-up with cardiology as planned   Meds ordered this encounter  Medications  . doxycycline (VIBRA-TABS) 100 MG tablet    Sig: Take 1 tablet (100 mg total) by mouth 2 (two) times daily.    Dispense:  42 tablet    Refill:  0   Patient Instructions                       Medicare Annual Wellness Visit  [facility] and the medical providers at [facility] strive to bring you the best medical care.  In doing so we not only want to address your current medical conditions and concerns but also to detect new conditions early and prevent illness, disease and health-related problems.    Medicare offers a yearly Wellness Visit which allows our clinical staff to assess your need for preventative services including immunizations, lifestyle education, counseling to decrease risk of preventable diseases  and screening for fall risk and other medical concerns.    This visit is provided free of charge (no copay) for all Medicare recipients. The clinical pharmacists at [facility] have begun to conduct these Wellness Visits which will also include a thorough review of all your medications.    As you primary medical provider recommend that you make an appointment for your Annual Wellness Visit if you have not done so already this year.  You may set up this appointment before you leave today or you may call back [phone]) and schedule an appointment.  Please make sure when you call that you mention that you are scheduling your Annual Wellness Visit with the clinical pharmacist so that the appointment may be made for the proper length of time.     Continue  current medications. Continue good therapeutic lifestyle changes which include good diet and exercise. Fall precautions discussed with patient. If an FOBT was given today- please return it to our front desk. If you are over 90 years old - you may need Prevnar 62 or the adult Pneumonia vaccine.  **Flu shots are available--- please call and schedule a FLU-CLINIC appointment**  After your visit with [place] today you will receive a survey in the mail or online from [employer] regarding your care with [place]. Please take a moment to fill this out. Your feedback is very important to [place] as you can help [place] better understand your patient needs as well as improve your experience and satisfaction. WE CARE ABOUT YOU!!!   Use warm wet compresses to the right shoulder and gentle range of motion exercises Avoid heavy lifting pushing pulling for a few days We will make an appointment for you to see the orthopedic specialist regarding your knee Continue to be careful and do not put yourself at risk for falling Check yourself daily for tick bites and consider using deep Sherral Hammers off on your lower extremities especially if out working in the yard Follow-up as planned with cardiology   Arrie Senate MD

## 2015-11-09 ENCOUNTER — Other Ambulatory Visit: Payer: Medicare Other

## 2015-11-09 DIAGNOSIS — I4819 Other persistent atrial fibrillation: Secondary | ICD-10-CM

## 2015-11-09 DIAGNOSIS — E785 Hyperlipidemia, unspecified: Secondary | ICD-10-CM

## 2015-11-09 DIAGNOSIS — E559 Vitamin D deficiency, unspecified: Secondary | ICD-10-CM

## 2015-11-09 DIAGNOSIS — E079 Disorder of thyroid, unspecified: Secondary | ICD-10-CM

## 2015-11-10 LAB — CBC WITH DIFFERENTIAL/PLATELET
BASOS: 1 %
Basophils Absolute: 0 10*3/uL (ref 0.0–0.2)
EOS (ABSOLUTE): 0.1 10*3/uL (ref 0.0–0.4)
Eos: 1 %
Hematocrit: 37.6 % (ref 34.0–46.6)
Hemoglobin: 12.6 g/dL (ref 11.1–15.9)
IMMATURE GRANULOCYTES: 0 %
Immature Grans (Abs): 0 10*3/uL (ref 0.0–0.1)
LYMPHS ABS: 1.8 10*3/uL (ref 0.7–3.1)
Lymphs: 26 %
MCH: 31 pg (ref 26.6–33.0)
MCHC: 33.5 g/dL (ref 31.5–35.7)
MCV: 92 fL (ref 79–97)
MONOS ABS: 0.8 10*3/uL (ref 0.1–0.9)
Monocytes: 12 %
NEUTROS PCT: 60 %
Neutrophils Absolute: 4.4 10*3/uL (ref 1.4–7.0)
PLATELETS: 268 10*3/uL (ref 150–379)
RBC: 4.07 x10E6/uL (ref 3.77–5.28)
RDW: 13.4 % (ref 12.3–15.4)
WBC: 7.2 10*3/uL (ref 3.4–10.8)

## 2015-11-10 LAB — NMR, LIPOPROFILE
Cholesterol: 136 mg/dL (ref 100–199)
HDL Cholesterol by NMR: 68 mg/dL (ref >39)
HDL PARTICLE NUMBER: 38.2 umol/L (ref >=30.5)
LDL PARTICLE NUMBER: 645 nmol/L (ref <1000)
LDL Size: 20.8 nm (ref >20.5)
LDL-C: 54 mg/dL (ref 0–99)
SMALL LDL PARTICLE NUMBER: 237 nmol/L (ref <=527)
Triglycerides by NMR: 69 mg/dL (ref 0–149)

## 2015-11-10 LAB — BMP8+EGFR
BUN / CREAT RATIO: 17 (ref 12–28)
BUN: 15 mg/dL (ref 8–27)
CO2: 24 mmol/L (ref 18–29)
Calcium: 9.4 mg/dL (ref 8.7–10.3)
Chloride: 102 mmol/L (ref 96–106)
Creatinine, Ser: 0.9 mg/dL (ref 0.57–1.00)
GFR calc Af Amer: 69 mL/min/{1.73_m2} (ref >59)
GFR, EST NON AFRICAN AMERICAN: 60 mL/min/{1.73_m2} (ref >59)
GLUCOSE: 87 mg/dL (ref 65–99)
POTASSIUM: 4.5 mmol/L (ref 3.5–5.2)
SODIUM: 141 mmol/L (ref 134–144)

## 2015-11-10 LAB — THYROID PANEL WITH TSH
Free Thyroxine Index: 2.4 (ref 1.2–4.9)
T3 Uptake Ratio: 31 % (ref 24–39)
T4, Total: 7.8 ug/dL (ref 4.5–12.0)
TSH: 2.92 u[IU]/mL (ref 0.450–4.500)

## 2015-11-10 LAB — HEPATIC FUNCTION PANEL
ALT: 11 IU/L (ref 0–32)
AST: 17 IU/L (ref 0–40)
Albumin: 4.2 g/dL (ref 3.5–4.7)
Alkaline Phosphatase: 69 IU/L (ref 39–117)
BILIRUBIN TOTAL: 0.5 mg/dL (ref 0.0–1.2)
Bilirubin, Direct: 0.17 mg/dL (ref 0.00–0.40)
Total Protein: 6.9 g/dL (ref 6.0–8.5)

## 2015-11-10 LAB — VITAMIN D 25 HYDROXY (VIT D DEFICIENCY, FRACTURES): VIT D 25 HYDROXY: 54.3 ng/mL (ref 30.0–100.0)

## 2015-11-24 ENCOUNTER — Other Ambulatory Visit: Payer: Self-pay | Admitting: Family Medicine

## 2015-12-07 ENCOUNTER — Other Ambulatory Visit: Payer: Self-pay | Admitting: Family Medicine

## 2016-02-21 IMAGING — CR DG FOOT COMPLETE 3+V*L*
3 series · 3 of 3 positions shown · non-contrast
Comparison: None.

CLINICAL DATA: Left foot and toe pain

EXAM:
LEFT FOOT - COMPLETE 3+ VIEW

[view not recorded (1 of 3)]
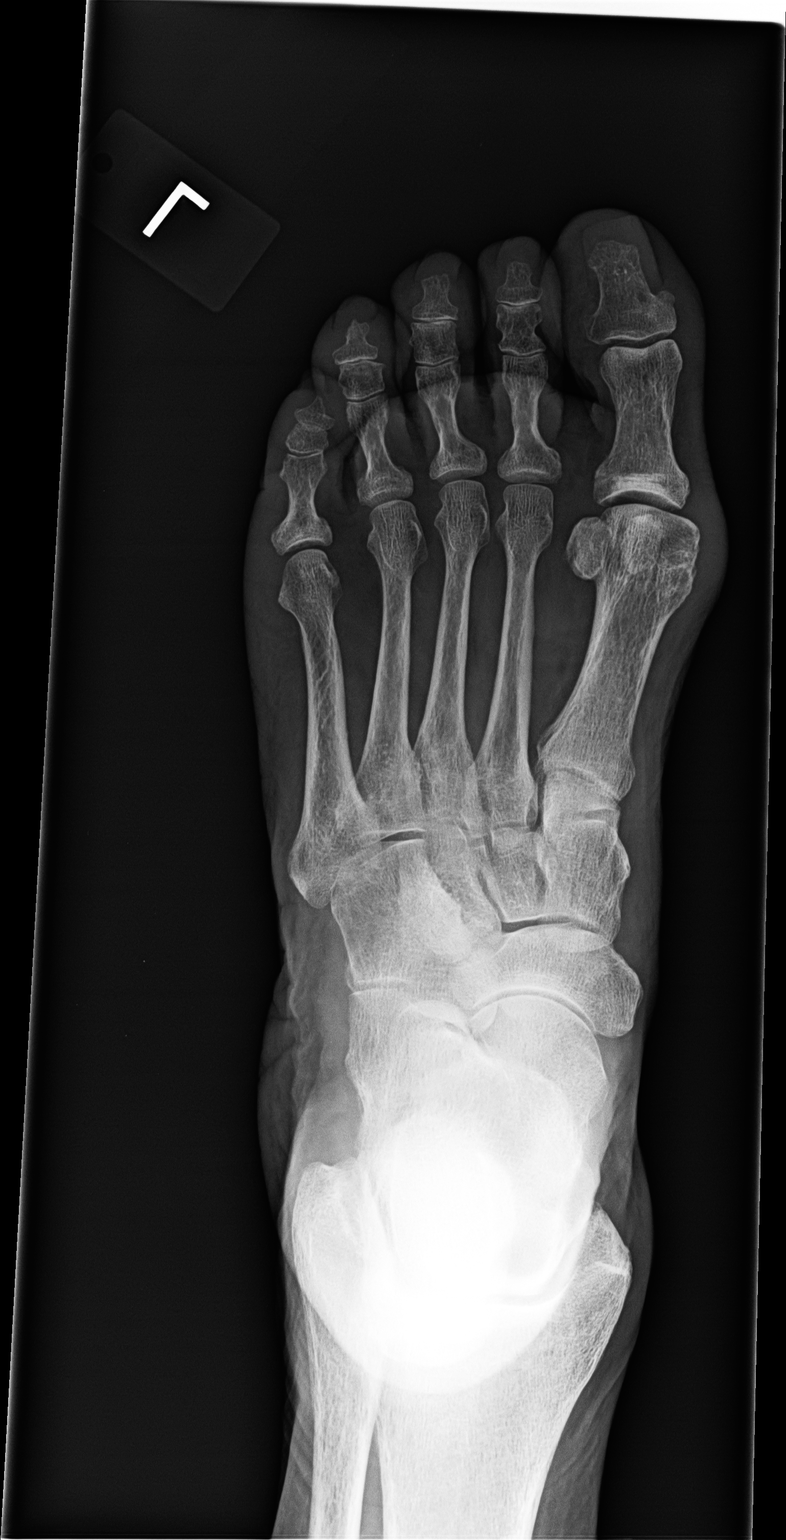

[view not recorded (2 of 3)]
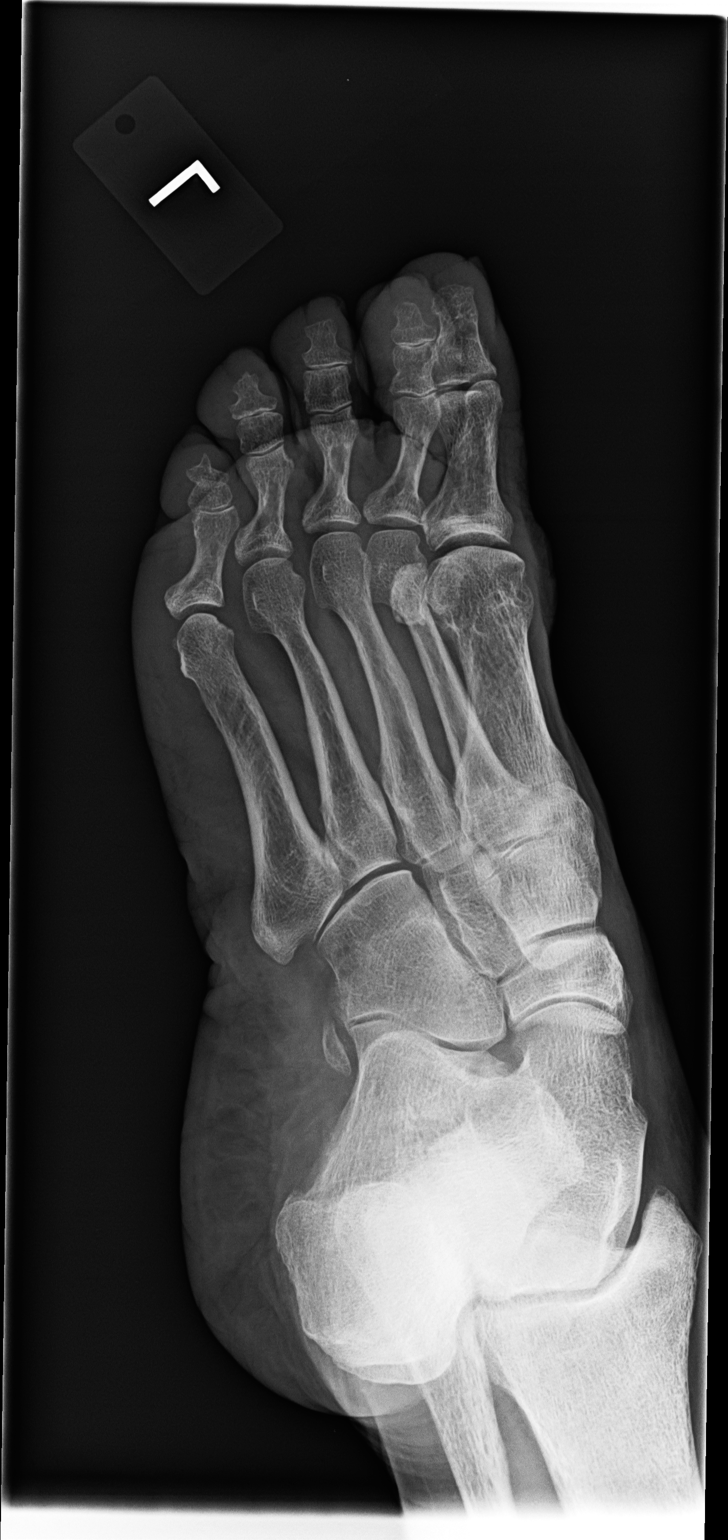

[view not recorded (3 of 3)]
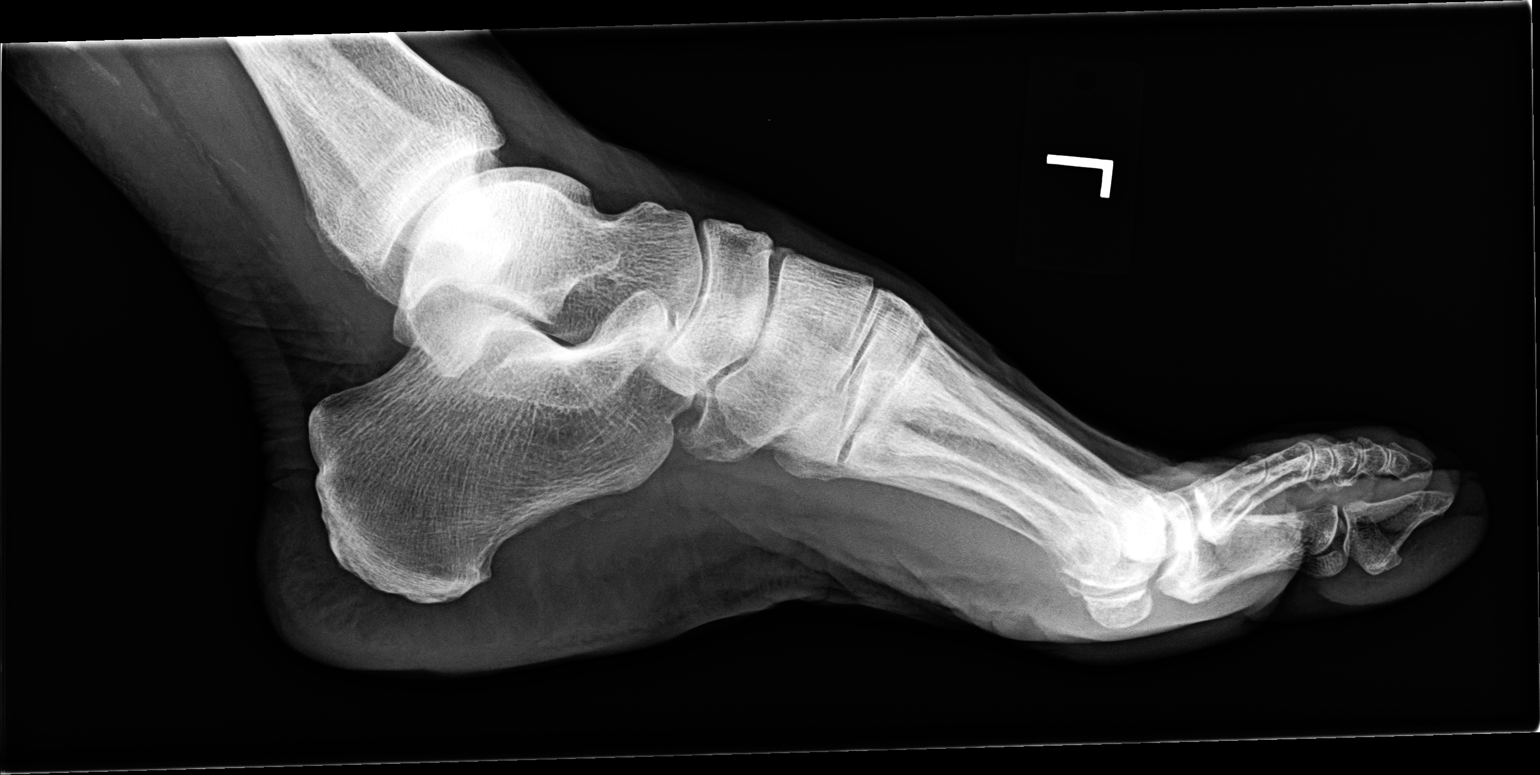

[3 of 3 positions shown; findings below may reference images not displayed]

FINDINGS: Tarsal-metatarsal alignment is normal. Very little degenerative
change is present for age. No fracture is noted. No erosion is seen.
IMPRESSION: Negative.  Very little degenerative change for age.

## 2016-02-22 ENCOUNTER — Encounter: Payer: Self-pay | Admitting: Pediatrics

## 2016-02-22 ENCOUNTER — Ambulatory Visit (INDEPENDENT_AMBULATORY_CARE_PROVIDER_SITE_OTHER): Payer: Medicare Other | Admitting: Pediatrics

## 2016-02-22 VITALS — BP 120/80 | HR 74 | Temp 98.0°F | Ht 62.5 in | Wt 128.6 lb

## 2016-02-22 DIAGNOSIS — D485 Neoplasm of uncertain behavior of skin: Secondary | ICD-10-CM | POA: Diagnosis not present

## 2016-02-22 NOTE — Progress Notes (Signed)
    Subjective:    Patient ID: [name], female    DOB: [birth year], 82 y.o.   MRN: [id number]  CC: Lesion on hand   HPI: [name] is a 82 y.o. female presenting for Lesion on hand  Has had rough spot on back of L hand for weeks hasnt been paying attention to it but now growing Is red and irritated Hard protrusion in the middle Not itchy, not bleeding Has been growing No other similar lesions Feeling well otherwise Is on blood thinner for afib   Depression screen [facility] 2/9 02/22/2016 11/08/2015 09/12/2015 06/22/2015 04/06/2015  Decreased Interest 0 0 0 0 0  Down, Depressed, Hopeless 0 0 0 0 0  PHQ - 2 Score 0 0 0 0 0     Relevant past medical, surgical, family and social history reviewed and updated as indicated.  Interim medical history since our last visit reviewed. Allergies and medications reviewed and updated.  ROS: Per HPI unless specifically indicated above  History  Smoking status  . Former Smoker -- 1.00 packs/day for 30 years  . Types: Cigarettes  Smokeless tobacco  . Never Used    Comment: quit in 1980s       Objective:    BP 120/80 mmHg  Pulse 74  Temp(Src) 98 F (36.7 C) (Oral)  Ht 5' 2.5" (1.588 m)  Wt 128 lb 9.6 oz (58.333 kg)  BMI 23.13 kg/m2  Wt Readings from Last 3 Encounters:  02/22/16 128 lb 9.6 oz (58.333 kg)  11/08/15 129 lb (58.514 kg)  10/24/15 130 lb 9.6 oz (59.24 kg)     Gen: NAD, alert, cooperative with exam, NCAT EYES: EOMI, no scleral injection or icterus CV: WWP, distal pulses 2+ b/l Resp:normal WOB Neuro: Alert and oriented Skin: L hand dorsum over 5th metacarpal with 61mm papule, isolated yellow horn in center, surounded by erythematous base     Assessment & Plan:    [name] was seen today for lesion on hand. Biopsied as below.  Diagnoses and all orders for this visit:  Neoplasm of uncertain behavior of skin -     Pathology  PROCEDURE: After risks and benefits and alternatives and  procedure itself discussed in detail, pt agreed to proceed with biopsy of L dorsum of hand. Area was thoroughly cleaned and prepped with betadine. 0.34mL of 1% lidocaine with epi was injected under the the lesion. Using a dermablade, the lesion was removed, hemostasis achieved with silver nitrate stick x 2. Wound dressed, wound care discussed with pt.    Follow up plan: As needed  Assunta Found, MD [facility] 02/22/2016, 3:24 PM

## 2016-02-23 NOTE — Addendum Note (Signed)
Addended by: Michaela Corner on: 02/23/2016 09:47 AM   Modules accepted: Orders

## 2016-02-27 LAB — PATHOLOGY

## 2016-04-02 ENCOUNTER — Ambulatory Visit: Payer: Medicare Other | Admitting: Family Medicine

## 2016-04-16 ENCOUNTER — Encounter: Payer: Self-pay | Admitting: Family Medicine

## 2016-04-16 ENCOUNTER — Ambulatory Visit (INDEPENDENT_AMBULATORY_CARE_PROVIDER_SITE_OTHER): Payer: Medicare Other | Admitting: Family Medicine

## 2016-04-16 VITALS — BP 146/100 | HR 76 | Temp 98.3°F | Ht 62.5 in | Wt 132.0 lb

## 2016-04-16 DIAGNOSIS — E079 Disorder of thyroid, unspecified: Secondary | ICD-10-CM | POA: Diagnosis not present

## 2016-04-16 DIAGNOSIS — I481 Persistent atrial fibrillation: Secondary | ICD-10-CM | POA: Diagnosis not present

## 2016-04-16 DIAGNOSIS — E785 Hyperlipidemia, unspecified: Secondary | ICD-10-CM | POA: Diagnosis not present

## 2016-04-16 DIAGNOSIS — R5383 Other fatigue: Secondary | ICD-10-CM

## 2016-04-16 DIAGNOSIS — E559 Vitamin D deficiency, unspecified: Secondary | ICD-10-CM | POA: Diagnosis not present

## 2016-04-16 DIAGNOSIS — I4819 Other persistent atrial fibrillation: Secondary | ICD-10-CM

## 2016-04-16 MED ORDER — ATORVASTATIN CALCIUM 10 MG PO TABS: ORAL_TABLET | ORAL | 3 refills | Status: DC

## 2016-04-16 NOTE — Progress Notes (Signed)
Subjective:    Patient ID: [name], female    DOB: May 15, 1931, 82 y.o.   MRN: [id number]  HPI Pt here for follow up and management of chronic medical problems which includes hyperlipidemia. She is taking medications regularly.The patient complains of some fatigue which seems to be worse at night and also has some concerns about her right great toenail which seems to break more easily. She is due to get lab work. She is also due to get a DEXA scan and will be scheduled for this. The patient stays active during the day and feels fatigued at night. She is doing exercise classes and stays busy. She does not do as much around the house because she is only one that is there. She does see the cardiologist or the nurse practitioner on a fairly regular basis. She is concerned that some of her medicine might be causing her heart rate to be slow. She does not know what her heart rate is running at home and she will try to record this more often as specimen before the next visit to the nurse practitioner. She says that her blood pressures at home run somewhere around the 109 her 110 over the 70s. We got her higher reading here initially today and we will recheck that before she leaves. She is due to get lab work and will be scheduled for a DEXA scan soon. She denies any chest pain or shortness of breath. She denies any problems with her GI tract including nausea vomiting diarrhea blood in the stool or black tarry bowel movements. She is passing her water without problems.    Patient Active Problem List   Diagnosis Date Noted  . AV malformation of gastrointestinal tract   . Acute bronchitis 04/06/2015  . Primary osteoarthritis involving multiple joints 02/07/2015  . High risk medication use 06/21/2013  . Dizziness   . Restless leg syndrome 03/01/2013  . ATRIAL FIBRILLATION 11/03/2008  . Hyperlipidemia 10/24/2008  . ANEMIA, IRON DEFICIENCY, MICROCYTIC 10/24/2008  . SINUS BRADYCARDIA  10/24/2008  . CVA 10/24/2008  . OSTEOPENIA 10/24/2008   Outpatient Encounter Prescriptions as of 04/16/2016  Medication Sig  . atorvastatin (LIPITOR) 10 MG tablet TAKE 1 TABLET BY MOUTH ONCE A DAY OR AS INSTRUCTED BY PHYSICIAN  . Calcium Carbonate-Vit D-Min 1200-1000 MG-UNIT CHEW Chew 1 tablet by mouth daily.  . chlorhexidine (PERIDEX) 0.12 % solution Mouthwash - Use as directed  . Cholecalciferol (VITAMIN D3) 1000 UNITS CAPS Take 1 capsule by mouth daily.  Marland Kitchen diltiazem (CARDIZEM CD) 180 MG 24 hr capsule TAKE 1 CAPSULE (180 MG TOTAL) BY MOUTH DAILY.  . folic acid (FOLVITE) 1 MG tablet Take 1 mg by mouth daily.  Marland Kitchen ipratropium (ATROVENT) 0.06 % nasal spray PLACE 1 SPRAY INTO EACH NOSTRIL TWICE A DAY AS NEEDED  . Magnesium 500 MG TABS Take 1 tablet by mouth daily.  . Multiple Vitamin (MULTIVITAMIN) capsule Take 1 capsule by mouth daily.    . Rivaroxaban (XARELTO) 15 MG TABS tablet TAKE 1 TABLET (15 MG TOTAL) BY MOUTH DAILY.   No facility-administered encounter medications on file as of 04/16/2016.       Review of Systems  Constitutional: Positive for fatigue (worse at night).  HENT: Negative.   Eyes: Negative.   Respiratory: Negative.   Cardiovascular: Negative.   Gastrointestinal: Negative.   Endocrine: Negative.   Genitourinary: Negative.   Musculoskeletal: Negative.   Skin: Negative.        Toenail - great toe right  foot -  Weak nail/ breaks  Allergic/Immunologic: Negative.   Neurological: Negative.   Hematological: Negative.   Psychiatric/Behavioral: Negative.        Objective:   Physical Exam  Constitutional: She is oriented to person, place, and time. She appears well-developed and well-nourished. No distress.  Alert and pleasant and much younger looking than her stated age  HENT:  Head: Normocephalic and atraumatic.  Right Ear: External ear normal.  Left Ear: External ear normal.  Nose: Nose normal.  Mouth/Throat: Oropharynx is clear and moist. No oropharyngeal  exudate.  Eyes: Conjunctivae and EOM are normal. Pupils are equal, round, and reactive to light. Right eye exhibits no discharge. Left eye exhibits no discharge. No scleral icterus.  Neck: Normal range of motion. Neck supple. No thyromegaly present.  Cardiovascular: Normal rate, normal heart sounds and intact distal pulses.   No murmur heard. The heart is irregular irregular at 84/m  Pulmonary/Chest: Effort normal and breath sounds normal. No respiratory distress. She has no wheezes. She has no rales.  Abdominal: Soft. Bowel sounds are normal. She exhibits no mass. There is no tenderness. There is no rebound and no guarding.  No abdominal tenderness masses or organ enlargement  Musculoskeletal: Normal range of motion. She exhibits no edema.  Lymphadenopathy:    She has no cervical adenopathy.  Neurological: She is alert and oriented to person, place, and time. She has normal reflexes. No cranial nerve deficit.  Skin: Skin is warm and dry. No rash noted.  Dry skin  Psychiatric: She has a normal mood and affect. Her behavior is normal. Judgment and thought content normal.  Nursing note and vitals reviewed.  BP (!) 143/92 (BP Location: Left Arm)   Pulse 76   Temp 98.3 F (36.8 C) (Oral)   Ht 5' 2.5" (1.588 m)   Wt 132 lb (59.9 kg)   BMI 23.76 kg/m         Assessment & Plan:  1. Hyperlipidemia -Continue current treatment pending results of lab work - BMP8+EGFR; Future - CBC with Differential/Platelet; Future - Hepatic function panel; Future - NMR, lipoprofile; Future  2. Thyroid disease -Continue current treatment pending results of lab work - CBC with Differential/Platelet; Future  3. Vitamin D deficiency -Continue current treatment pending results of lab work - CBC with Differential/Platelet; Future - VITAMIN D 25 Hydroxy (Vit-D Deficiency, Fractures); Future  4. Persistent atrial fibrillation ([place]) -Follow-up with cardiology as planned - CBC with  Differential/Platelet; Future  5. Other fatigue -Check reasons for fatigue including thyroid profile - CBC with Differential/Platelet; Future - Vitamin B12; Future  Patient Instructions                       Medicare Annual Wellness Visit  [facility] and the medical providers at [facility] strive to bring you the best medical care.  In doing so we not only want to address your current medical conditions and concerns but also to detect new conditions early and prevent illness, disease and health-related problems.    Medicare offers a yearly Wellness Visit which allows our clinical staff to assess your need for preventative services including immunizations, lifestyle education, counseling to decrease risk of preventable diseases and screening for fall risk and other medical concerns.    This visit is provided free of charge (no copay) for all Medicare recipients. The clinical pharmacists at [facility] have begun to conduct these Wellness Visits which will also include a thorough  review of all your medications.    As you primary medical provider recommend that you make an appointment for your Annual Wellness Visit if you have not done so already this year.  You may set up this appointment before you leave today or you may call back [phone]) and schedule an appointment.  Please make sure when you call that you mention that you are scheduling your Annual Wellness Visit with the clinical pharmacist so that the appointment may be made for the proper length of time.     Continue current medications. Continue good therapeutic lifestyle changes which include good diet and exercise. Fall precautions discussed with patient. If an FOBT was given today- please return it to our front desk. If you are over 62 years old - you may need Prevnar 37 or the adult Pneumonia vaccine.  **Flu shots are available--- please call and schedule a FLU-CLINIC  appointment**  After your visit with [place] today you will receive a survey in the mail or online from [employer] regarding your care with [place]. Please take a moment to fill this out. Your feedback is very important to [place] as you can help [place] better understand your patient needs as well as improve your experience and satisfaction. WE CARE ABOUT YOU!!!   Drink plenty of fluids and stay well hydrated Eat less salt Check blood pressures at home and bring these readings by for review in 2-3 weeks and have the nurse check your blood pressure here Also right pulse rates down And 10 you to stay active physically We will call with lab work results as soon as those results become available Make sure that you get your DEXA scan done Keep a Band-Aid on the loose toenail Make sure that you use scent free fabric softeners and detergents that her dermatology approved like all    Arrie Senate MD

## 2016-04-16 NOTE — Addendum Note (Signed)
Addended by: Zannie Cove on: 04/16/2016 04:03 PM   Modules accepted: Orders

## 2016-04-16 NOTE — Patient Instructions (Addendum)
Medicare Annual Wellness Visit  [facility] and the medical providers at [facility] strive to bring you the best medical care.  In doing so we not only want to address your current medical conditions and concerns but also to detect new conditions early and prevent illness, disease and health-related problems.    Medicare offers a yearly Wellness Visit which allows our clinical staff to assess your need for preventative services including immunizations, lifestyle education, counseling to decrease risk of preventable diseases and screening for fall risk and other medical concerns.    This visit is provided free of charge (no copay) for all Medicare recipients. The clinical pharmacists at [facility] have begun to conduct these Wellness Visits which will also include a thorough review of all your medications.    As you primary medical provider recommend that you make an appointment for your Annual Wellness Visit if you have not done so already this year.  You may set up this appointment before you leave today or you may call back [phone] and schedule an appointment.  Please make sure when you call that you mention that you are scheduling your Annual Wellness Visit with the clinical pharmacist so that the appointment may be made for the proper length of time.     Continue current medications. Continue good therapeutic lifestyle changes which include good diet and exercise. Fall precautions discussed with patient. If an FOBT was given today- please return it to our front desk. If you are over 8 years old - you may need Prevnar 58 or the adult Pneumonia vaccine.  **Flu shots are available--- please call and schedule a FLU-CLINIC appointment**  After your visit with [place] today you will receive a survey in the mail or online from [employer] regarding your care with [place]. Please take a moment to fill this out. Your feedback is very  important to [place] as you can help [place] better understand your patient needs as well as improve your experience and satisfaction. WE CARE ABOUT YOU!!!   Drink plenty of fluids and stay well hydrated Eat less salt Check blood pressures at home and bring these readings by for review in 2-3 weeks and have the nurse check your blood pressure here Also right pulse rates down And 10 you to stay active physically We will call with lab work results as soon as those results become available Make sure that you get your DEXA scan done Keep a Band-Aid on the loose toenail Make sure that you use scent free fabric softeners and detergents that her dermatology approved like all

## 2016-04-19 ENCOUNTER — Other Ambulatory Visit: Payer: Medicare Other

## 2016-04-19 DIAGNOSIS — E785 Hyperlipidemia, unspecified: Secondary | ICD-10-CM

## 2016-04-19 DIAGNOSIS — I4819 Other persistent atrial fibrillation: Secondary | ICD-10-CM

## 2016-04-19 DIAGNOSIS — R5383 Other fatigue: Secondary | ICD-10-CM

## 2016-04-19 DIAGNOSIS — E559 Vitamin D deficiency, unspecified: Secondary | ICD-10-CM

## 2016-04-19 DIAGNOSIS — E079 Disorder of thyroid, unspecified: Secondary | ICD-10-CM

## 2016-04-20 LAB — NMR, LIPOPROFILE
Cholesterol: 133 mg/dL (ref 100–199)
HDL CHOLESTEROL BY NMR: 62 mg/dL (ref >39)
HDL Particle Number: 33.2 umol/L (ref >=30.5)
LDL PARTICLE NUMBER: 701 nmol/L (ref <1000)
LDL Size: 21.3 nm (ref >20.5)
LDL-C: 56 mg/dL (ref 0–99)
LP-IR Score: <25 (ref <=45)
Small LDL Particle Number: 214 nmol/L (ref <=527)
TRIGLYCERIDES BY NMR: 73 mg/dL (ref 0–149)

## 2016-04-23 ENCOUNTER — Ambulatory Visit (HOSPITAL_COMMUNITY)
Admission: RE | Admit: 2016-04-23 | Discharge: 2016-04-23 | Disposition: A | Discharge status: Home / Self Care | Payer: Medicare Other | Source: Ambulatory Visit | Attending: Nurse Practitioner | Admitting: Nurse Practitioner

## 2016-04-23 VITALS — BP 142/84 | HR 100 | Ht 62.5 in | Wt 128.6 lb

## 2016-04-23 DIAGNOSIS — E785 Hyperlipidemia, unspecified: Secondary | ICD-10-CM | POA: Insufficient documentation

## 2016-04-23 DIAGNOSIS — Z87891 Personal history of nicotine dependence: Secondary | ICD-10-CM | POA: Insufficient documentation

## 2016-04-23 DIAGNOSIS — D509 Iron deficiency anemia, unspecified: Secondary | ICD-10-CM | POA: Insufficient documentation

## 2016-04-23 DIAGNOSIS — I481 Persistent atrial fibrillation: Secondary | ICD-10-CM | POA: Insufficient documentation

## 2016-04-23 DIAGNOSIS — I4891 Unspecified atrial fibrillation: Secondary | ICD-10-CM | POA: Diagnosis present

## 2016-04-23 DIAGNOSIS — Z8673 Personal history of transient ischemic attack (TIA), and cerebral infarction without residual deficits: Secondary | ICD-10-CM | POA: Diagnosis not present

## 2016-04-23 DIAGNOSIS — M8580 Other specified disorders of bone density and structure, unspecified site: Secondary | ICD-10-CM | POA: Diagnosis not present

## 2016-04-23 DIAGNOSIS — G2581 Restless legs syndrome: Secondary | ICD-10-CM | POA: Diagnosis not present

## 2016-04-23 DIAGNOSIS — I482 Chronic atrial fibrillation, unspecified: Secondary | ICD-10-CM

## 2016-04-23 DIAGNOSIS — M1990 Unspecified osteoarthritis, unspecified site: Secondary | ICD-10-CM | POA: Insufficient documentation

## 2016-04-23 DIAGNOSIS — Z7901 Long term (current) use of anticoagulants: Secondary | ICD-10-CM | POA: Insufficient documentation

## 2016-04-23 LAB — THYROID PANEL WITH TSH
FREE THYROXINE INDEX: 2.3 (ref 1.2–4.9)
T3 UPTAKE RATIO: 28 % (ref 24–39)
T4 TOTAL: 8.2 ug/dL (ref 4.5–12.0)
TSH: 2.69 u[IU]/mL (ref 0.450–4.500)

## 2016-04-23 LAB — CBC WITH DIFFERENTIAL/PLATELET
BASOS ABS: 0 10*3/uL (ref 0.0–0.2)
Basos: 1 %
EOS (ABSOLUTE): 0.1 10*3/uL (ref 0.0–0.4)
EOS: 2 %
HEMATOCRIT: 36.7 % (ref 34.0–46.6)
Hemoglobin: 12.2 g/dL (ref 11.1–15.9)
IMMATURE GRANULOCYTES: 0 %
Immature Grans (Abs): 0 10*3/uL (ref 0.0–0.1)
Lymphocytes Absolute: 2 10*3/uL (ref 0.7–3.1)
Lymphs: 32 %
MCH: 30.8 pg (ref 26.6–33.0)
MCHC: 33.2 g/dL (ref 31.5–35.7)
MCV: 93 fL (ref 79–97)
MONOCYTES: 12 %
MONOS ABS: 0.7 10*3/uL (ref 0.1–0.9)
NEUTROS PCT: 53 %
Neutrophils Absolute: 3.4 10*3/uL (ref 1.4–7.0)
Platelets: 250 10*3/uL (ref 150–379)
RBC: 3.96 x10E6/uL (ref 3.77–5.28)
RDW: 13.3 % (ref 12.3–15.4)
WBC: 6.4 10*3/uL (ref 3.4–10.8)

## 2016-04-23 LAB — VITAMIN B12: VITAMIN B 12: 930 pg/mL (ref 211–946)

## 2016-04-23 LAB — BMP8+EGFR
BUN/Creatinine Ratio: 14 (ref 12–28)
BUN: 13 mg/dL (ref 8–27)
CALCIUM: 9.7 mg/dL (ref 8.7–10.3)
CHLORIDE: 104 mmol/L (ref 96–106)
CO2: 25 mmol/L (ref 18–29)
CREATININE: 0.94 mg/dL (ref 0.57–1.00)
GFR calc Af Amer: 65 mL/min/{1.73_m2} (ref >59)
GFR, EST NON AFRICAN AMERICAN: 56 mL/min/{1.73_m2} — AB (ref >59)
Glucose: 80 mg/dL (ref 65–99)
Potassium: 4.8 mmol/L (ref 3.5–5.2)
Sodium: 141 mmol/L (ref 134–144)

## 2016-04-23 LAB — VITAMIN D 25 HYDROXY (VIT D DEFICIENCY, FRACTURES): Vit D, 25-Hydroxy: 45.8 ng/mL (ref 30.0–100.0)

## 2016-04-23 LAB — HEPATIC FUNCTION PANEL
ALBUMIN: 3.6 g/dL (ref 3.5–4.7)
ALT: 10 IU/L (ref 0–32)
AST: 18 IU/L (ref 0–40)
Alkaline Phosphatase: 68 IU/L (ref 39–117)
BILIRUBIN TOTAL: 0.4 mg/dL (ref 0.0–1.2)
Bilirubin, Direct: 0.13 mg/dL (ref 0.00–0.40)
TOTAL PROTEIN: 6.3 g/dL (ref 6.0–8.5)

## 2016-04-23 NOTE — Progress Notes (Signed)
Patient ID: [name], female   DOB: 02-24-1931, 82 y.o.   MRN: [id number]     Primary Care Physician: Lisa Gainer, MD Referring Physician: Dr. Geraldo Docker [name] is a 82 y.o. female with a h/o chronic afib. She is in the afib clinic for f/u and reports that she is doing the activities that she enjoys and does not notice any irregular heart rate. She is having RVR today but got caught up in traffic for like 30-40 mins due to construction and then got lost in the hospital. Her heart rate on presentation was 129 bpm, but then slowed to around 100 bpm. She is usually rate controlled and when she saw her PCP last week HR was in the 70's. She is line dancing at times now and is active in a [employer] group and just got back form [place] and meets at the [employer] in her area for Tuesday am Bingo.She is on xarelto without bleeding issues.  Today, she denies symptoms of palpitations, chest pain, shortness of breath, orthopnea, PND, lower extremity edema, dizziness, presyncope, syncope, or neurologic sequela. The patient is tolerating medications without difficulties and is otherwise without complaint today.   Past Medical History:  Diagnosis Date  . Arthritis   . AV malformation of gastrointestinal tract    in cecum she denies history of GIB  . Breast mass, right   . Cataract   . CVA (cerebral vascular accident) ([facility]) 2007  . Dizziness    denies this as an ongoing problem  . Hyperlipidemia   . Microcytic anemia   . Osteopenia   . Persistent atrial fibrillation ([place] [facility])    s/p PVI 04/2010  CHADS2vasc at least 5  . RLS (restless legs syndrome)   . Sinus bradycardia    Past Surgical History:  Procedure Laterality Date  . afib ablation  04/2010   PVI with CTI ablation performed by [facility]  . breast mass resected    . CARDIOVERSION     for afib  . CATARACT EXTRACTION W/PHACO  11/11/2011   Procedure: CATARACT EXTRACTION PHACO AND INTRAOCULAR LENS PLACEMENT (IOC);   Surgeon: Lisa Branch, MD;  Location: [facility];  Service: Ophthalmology;  Laterality: Left;  CDE:12.62  . CATARACT EXTRACTION W/PHACO  11/25/2011   Procedure: CATARACT EXTRACTION PHACO AND INTRAOCULAR LENS PLACEMENT (IOC);  Surgeon: Lisa Branch, MD;  Location: [facility];  Service: Ophthalmology;  Laterality: Right;  CDE 16.14  . dental inplants      Current Outpatient Prescriptions  Medication Sig Dispense Refill  . atorvastatin (LIPITOR) 10 MG tablet TAKE 1/2 TABLET BY MOUTH ONCE A DAY OR AS INSTRUCTED BY PHYSICIAN 45 tablet 3  . Calcium Carbonate-Vit D-Min 1200-1000 MG-UNIT CHEW Chew 1 tablet by mouth daily.    . chlorhexidine (PERIDEX) 0.12 % solution Mouthwash - Use as directed    . Cholecalciferol (VITAMIN D3) 1000 UNITS CAPS Take 1 capsule by mouth daily.    Marland Kitchen diltiazem (CARDIZEM CD) 180 MG 24 hr capsule TAKE 1 CAPSULE (180 MG TOTAL) BY MOUTH DAILY. 90 capsule 3  . folic acid (FOLVITE) 1 MG tablet Take 1 mg by mouth daily.    Marland Kitchen ipratropium (ATROVENT) 0.06 % nasal spray PLACE 1 SPRAY INTO EACH NOSTRIL TWICE A DAY AS NEEDED 15 mL 5  . Magnesium 500 MG TABS Take 1 tablet by mouth daily.    . Multiple Vitamin (MULTIVITAMIN) capsule Take 1 capsule by mouth daily.      . Rivaroxaban (XARELTO) 15 MG  TABS tablet TAKE 1 TABLET (15 MG TOTAL) BY MOUTH DAILY. 30 tablet 6   No current facility-administered medications for this encounter.     No Known Allergies  Social History   Social History  . Marital status: Widowed    Spouse name: N/A  . Number of children: 0  . Years of education: college   Occupational History  . retired    Social History Main Topics  . Smoking status: Former Smoker    Packs/day: 1.00    Years: 30.00    Types: Cigarettes  . Smokeless tobacco: Never Used     Comment: quit in 1980s  . Alcohol use No  . Drug use: No  . Sexual activity: No   Other Topics Concern  . Not on file   Social History Narrative   Patient is retired and lives at home alone. Patient has  business college education.    Right handed.   Caffeine- some times - soda     Family History  Problem Relation Age of Onset  . Stroke Mother 61  . Hip fracture Mother   . Lung cancer Father 11  . Anesthesia problems Neg Hx   . Hypotension Neg Hx   . Malignant hyperthermia Neg Hx   . Pseudochol deficiency Neg Hx     ROS- All systems are reviewed and negative except as per the HPI above  Physical Exam: Vitals:   04/23/16 1344 04/23/16 1423  BP: (!) 142/84   Pulse: (!) 129 100  Weight: 128 lb 9.6 oz (58.3 kg)   Height: 5' 2.5" (1.588 m)     GEN- The patient is well appearing, alert and oriented x 3 today.   Head- normocephalic, atraumatic Eyes-  Sclera clear, conjunctiva pink Ears- hearing intact Oropharynx- clear Neck- supple, no JVP Lymph- no cervical lymphadenopathy Lungs- Clear to ausculation bilaterally, normal work of breathing Heart- iregular rate and rhythm, no murmurs, rubs or gallops, PMI not laterally displaced GI- soft, NT, ND, + BS Extremities- no clubbing, cyanosis, or edema MS- no significant deformity or atrophy Skin- no rash or lesion Psych- euthymic mood, full affect Neuro- strength and sensation are intact  EKG- afib at 129 bpm, qrs int 66 ms, qtc 377 ms Epic records reviewed Labs just checked with PCP 9/15 with Creatinine at 0.94 creat cal at 41  Assessment and Plan: 1. afib Chronic and rate controlled, unusual presentation today with elevated HR, possible due to "nerves" due to traffic getting here Doing well with minimal symptoms Continue diltiazem Continue xarelto   F/u in afib clinic q 6 months  [name], [facility] [address] [place], [place] [address] [phone]

## 2016-05-07 ENCOUNTER — Ambulatory Visit (INDEPENDENT_AMBULATORY_CARE_PROVIDER_SITE_OTHER): Payer: Medicare Other | Admitting: Pharmacist

## 2016-05-07 ENCOUNTER — Encounter: Payer: Self-pay | Admitting: Pharmacist

## 2016-05-07 ENCOUNTER — Ambulatory Visit (INDEPENDENT_AMBULATORY_CARE_PROVIDER_SITE_OTHER): Payer: Medicare Other

## 2016-05-07 VITALS — BP 122/78 | HR 74 | Ht 62.5 in | Wt 133.5 lb

## 2016-05-07 DIAGNOSIS — Z23 Encounter for immunization: Secondary | ICD-10-CM | POA: Diagnosis not present

## 2016-05-07 DIAGNOSIS — M8589 Other specified disorders of bone density and structure, multiple sites: Secondary | ICD-10-CM | POA: Diagnosis not present

## 2016-05-07 DIAGNOSIS — M899 Disorder of bone, unspecified: Secondary | ICD-10-CM

## 2016-05-07 DIAGNOSIS — Z0000 Encounter for general adult medical examination without abnormal findings: Secondary | ICD-10-CM | POA: Diagnosis not present

## 2016-05-07 MED ORDER — ACETAMINOPHEN 500 MG PO TABS: 500.0000 mg | ORAL_TABLET | Freq: Three times a day (TID) | ORAL | 0 refills | Status: DC | PRN

## 2016-05-07 NOTE — Patient Instructions (Addendum)
[name] , Thank you for taking time to come for your Medicare Wellness Visit. I appreciate your ongoing commitment to your health goals. Please review the following plan we discussed and let me know if I can assist you in the future.   These are the goals we discussed:  Separate dose of Multivitamin and Calcium supplement to get maximum absorption.  Recommend take multivitamin in morning and calcium supplement in evening.  Try Claritin 10mg  - take 1 tablet at bedtime as needed for runny nose or can also try Flonase nasal spray - use 1 or 2 sprays in each nostril once a day.  Look for copy of [facility] (important to know where these are kept - you can also bring copy to our office to be placed in our file / electronic chart)  Continue to exercise regularly - great job with dancing and strength classes  Can try tylenol or acetaminophen 500mg  - take 1 to 2 tablets up to 3 times a day as needed for arthritis pain   This is a list of the screening recommended for you and due dates:  Health Maintenance  Topic Date Due  . Flu Shot  Done today  . Pneumonia vaccines (2 of 2 - PPSV23) Completed  . Mammogram  07/31/2016  . Tetanus Vaccine  05/06/2021  . DEXA scan (bone density measurement)  Completed  . Shingles Vaccine  Completed  *Topic was postponed. The date shown is not the original due date.   Fall Prevention in the Home  Falls can cause injuries and can affect people from all age groups. There are many simple things that you can do to make your home safe and to help prevent falls. WHAT CAN I DO ON THE OUTSIDE OF MY HOME?  Regularly repair the edges of walkways and driveways and fix any cracks.  Remove high doorway thresholds.  Trim any shrubbery on the main path into your home.  Use bright outdoor lighting.  Clear walkways of debris and clutter, including tools and rocks.  Regularly check that handrails are  securely fastened and in good repair. Both sides of any steps should have handrails.  Install guardrails along the edges of any raised decks or porches.  Have leaves, snow, and ice cleared regularly.  Use sand or salt on walkways during winter months.  In the garage, clean up any spills right away, including grease or oil spills. WHAT CAN I DO IN THE BATHROOM?  Use night lights.  Install grab bars by the toilet and in the tub and shower. Do not use towel bars as grab bars.  Use non-skid mats or decals on the floor of the tub or shower.  If you need to sit down while you are in the shower, use a plastic, non-slip stool.Marland Kitchen  Keep the floor dry. Immediately clean up any water that spills on the floor.  Remove soap buildup in the tub or shower on a regular basis.  Attach bath mats securely with double-sided non-slip rug tape.  Remove throw rugs and other tripping hazards from the floor. WHAT CAN I DO IN THE BEDROOM?  Use night lights.  Make sure that a bedside light is easy to reach.  Do not use oversized bedding that drapes onto the floor.  Have a firm chair that has side arms to use for getting dressed.  Remove throw rugs and other tripping hazards from the floor. WHAT CAN I DO IN THE  KITCHEN?   Clean up any spills right away.  Avoid walking on wet floors.  Place frequently used items in easy-to-reach places.  If you need to reach for something above you, use a sturdy step stool that has a grab bar.  Keep electrical cables out of the way.  Do not use floor polish or wax that makes floors slippery. If you have to use wax, make sure that it is non-skid floor wax.  Remove throw rugs and other tripping hazards from the floor. WHAT CAN I DO IN THE STAIRWAYS?  Do not leave any items on the stairs.  Make sure that there are handrails on both sides of the stairs. Fix handrails that are broken or loose. Make sure that handrails are as long as the stairways.  Check any  carpeting to make sure that it is firmly attached to the stairs. Fix any carpet that is loose or worn.  Avoid having throw rugs at the top or bottom of stairways, or secure the rugs with carpet tape to prevent them from moving.  Make sure that you have a light switch at the top of the stairs and the bottom of the stairs. If you do not have them, have them installed. WHAT ARE SOME OTHER FALL PREVENTION TIPS?  Wear closed-toe shoes that fit well and support your feet. Wear shoes that have rubber soles or low heels.  When you use a stepladder, make sure that it is completely opened and that the sides are firmly locked. Have someone hold the ladder while you are using it. Do not climb a closed stepladder.  Add color or contrast paint or tape to grab bars and handrails in your home. Place contrasting color strips on the first and last steps.  Use mobility aids as needed, such as canes, walkers, scooters, and crutches.  Turn on lights if it is dark. Replace any light bulbs that burn out.  Set up furniture so that there are clear paths. Keep the furniture in the same spot.  Fix any uneven floor surfaces.  Choose a carpet design that does not hide the edge of steps of a stairway.  Be aware of any and all pets.  Review your medicines with your healthcare provider. Some medicines can cause dizziness or changes in blood pressure, which increase your risk of falling. Talk with your health care provider about other ways that you can decrease your risk of falls. This may include working with a physical therapist or trainer to improve your strength, balance, and endurance.   This information is not intended to replace advice given to you by your health care provider. Make sure you discuss any questions you have with your health care provider.   Document Released: 07/12/2002 Document Revised: 12/06/2014 Document Reviewed: 08/26/2014 Elsevier Interactive Patient Education [employer].

## 2016-05-07 NOTE — Progress Notes (Signed)
Patient ID: [name], female   DOB: Aug 31, 1930, 82 y.o.   MRN: [id number]     Subjective:   [name] is a 82 y.o. female who presents for a subsequent Medicare Annual Wellness Visit.  [name] is widowed.  She lives alone.  She is very active with community and church. She exercises at [facility] or [employer] 4 days per week and take a tap dance class once per week.   Her only medical complaints today is report of arthritis related pain and runny nose.  She report pain about 3 days or less per week.  She currently does not take anything for this pain.  Runny nose occurs most monrings.  She is taking Atrovent NS but without much improvement  Current Medications (verified) Outpatient Encounter Prescriptions as of 05/07/2016  Medication Sig  . atorvastatin (LIPITOR) 10 MG tablet TAKE 1/2 TABLET BY MOUTH ONCE A DAY OR AS INSTRUCTED BY PHYSICIAN  . Calcium Carbonate-Vit D-Min 1200-1000 MG-UNIT CHEW Chew 1 tablet by mouth daily.  . chlorhexidine (PERIDEX) 0.12 % solution Mouthwash - Use as directed  . Cholecalciferol (VITAMIN D3) 1000 UNITS CAPS Take 1 capsule by mouth daily.  Marland Kitchen diltiazem (CARDIZEM CD) 180 MG 24 hr capsule TAKE 1 CAPSULE (180 MG TOTAL) BY MOUTH DAILY.  . folic acid (FOLVITE) 1 MG tablet Take 1 mg by mouth daily.  Marland Kitchen ipratropium (ATROVENT) 0.06 % nasal spray PLACE 1 SPRAY INTO EACH NOSTRIL TWICE A DAY AS NEEDED  . Magnesium 500 MG TABS Take 1 tablet by mouth daily.  . Multiple Vitamin (MULTIVITAMIN) capsule Take 1 capsule by mouth daily.    . Rivaroxaban (XARELTO) 15 MG TABS tablet TAKE 1 TABLET (15 MG TOTAL) BY MOUTH DAILY.   No facility-administered encounter medications on file as of 05/07/2016.     Allergies (verified) Review of patient's allergies indicates no known allergies.   History: Past Medical History:  Diagnosis Date  . Arthritis   . AV malformation of gastrointestinal tract    in cecum she denies history of GIB  . Breast  mass, right   . Cataract   . CVA (cerebral vascular accident) ([place]) 2007  . Dizziness    denies this as an ongoing problem  . Hyperlipidemia   . Microcytic anemia   . Osteopenia   . Persistent atrial fibrillation ([place])    s/p PVI 04/2010  CHADS2vasc at least 5  . RLS (restless legs syndrome)   . Sinus bradycardia    Past Surgical History:  Procedure Laterality Date  . afib ablation  04/2010   PVI with CTI ablation performed by [facility]  . breast mass resected    . CARDIOVERSION     for afib  . CATARACT EXTRACTION W/PHACO  11/11/2011   Procedure: CATARACT EXTRACTION PHACO AND INTRAOCULAR LENS PLACEMENT (IOC);  Surgeon: Tonny Branch, MD;  Location: [facility];  Service: Ophthalmology;  Laterality: Left;  CDE:12.62  . CATARACT EXTRACTION W/PHACO  11/25/2011   Procedure: CATARACT EXTRACTION PHACO AND INTRAOCULAR LENS PLACEMENT (IOC);  Surgeon: Tonny Branch, MD;  Location: [facility];  Service: Ophthalmology;  Laterality: Right;  CDE 16.14  . dental inplants     Family History  Problem Relation Age of Onset  . Stroke Mother 13  . Hip fracture Mother   . Lung cancer Father 22  . Anesthesia problems Neg Hx   . Hypotension Neg Hx   . Malignant hyperthermia Neg Hx   . Pseudochol deficiency Neg Hx  Social History   Occupational History  . retired    Social History Main Topics  . Smoking status: Former Smoker    Packs/day: 1.00    Years: 30.00    Types: Cigarettes    Quit date: 08/05/1978  . Smokeless tobacco: Never Used     Comment: quit in 1980s  . Alcohol use No  . Drug use: No  . Sexual activity: No    Do you feel safe at home?  Yes Are there smokers in your home (other than you)? No  Dietary issues and exercise activities: Current Exercise Habits: Structured exercise class, Type of exercise: strength training/weights;stretching;Other - see comments ([employer] and [place]; also takes dance 1 time per week), Time (Minutes): 45, Frequency (Times/Week): 4, Weekly Exercise (Minutes/Week): 180,  Intensity: Moderate  Current Dietary habits:  3 meals per day.  Rarely more than 1 snack per day.  Drinks water mostly.   Loves vegetables.   Objective:    Today's Vitals   05/07/16 1021  BP: 122/78  Pulse: 74  Weight: 133 lb 8 oz (60.6 kg)  Height: 5' 2.5" (1.588 m)  PainSc: 0-No pain   Body mass index is 24.03 kg/m.  Activities of Daily Living In your present state of health, do you have any difficulty performing the following activities: 05/07/2016  Hearing? N  Vision? N  Difficulty concentrating or making decisions? N  Walking or climbing stairs? N  Dressing or bathing? N  Doing errands, shopping? N  Preparing Food and eating ? N  Using the Toilet? N  In the past six months, have you accidently leaked urine? N  Do you have problems with loss of bowel control? N  Managing your Medications? N  Managing your Finances? N  Housekeeping or managing your Housekeeping? N  Some recent data might be hidden     Cardiac Risk Factors include: advanced age (>65men, >54 women);hypertension  Depression Screen PHQ 2/9 Scores 05/07/2016 04/16/2016 02/22/2016 11/08/2015  PHQ - 2 Score 0 0 0 0     Fall Risk Fall Risk  05/07/2016 04/16/2016 02/22/2016 11/08/2015 09/12/2015  Falls in the past year? No No No No No  Number falls in past yr: - - - - -  Injury with Fall? - - - - -   Fall Risk Assessment:  Timed Up and Down - less than 10 seconds.  [name] has the following risk factors that could increase falls: Age greater than 12yo, more than 4 medications, more than 3 medical disgnoses.  [name] continues to exercise and her lower body strength is above average for her age. Vision is not impaired, denies fall within the last 12 months, no incontinence, no cognitive impairment, no chronic pain (occasional mild arthritis pain in shoulder and knee).   Cognitive Function: No flowsheet data found.  Immunizations and Health Maintenance Immunization History  Administered  Date(s) Administered  . Influenza,inj,Quad PF,36+ Mos 07/27/2013, 05/19/2014, 08/24/2015  . Pneumococcal Conjugate-13 10/07/2013  . Zoster 09/30/2014   Health Maintenance Due  Topic Date Due  . INFLUENZA VACCINE  03/05/2016  . DEXA SCAN  04/20/2016    Patient Care Team: Chipper Herb, MD as PCP - General (Family Medicine) Thompson Grayer, MD as Consulting Physician (Cardiology)  Indicate any recent Medical Services you may have received from other than [facility] providers in the past year (date may be approximate).    Assessment:    Annual Wellness Visit  Fall Risk Assessment - currently patient has a low  fall risk   Screening Tests Health Maintenance  Topic Date Due  . INFLUENZA VACCINE  03/05/2016  . DEXA SCAN  04/20/2016  . PNA vac Low Risk Adult (2 of 2 - PPSV23) 11/14/2016 (Originally 10/08/2014)  . MAMMOGRAM  07/31/2016  . TETANUS/TDAP  05/06/2021  . ZOSTAVAX  Completed        Plan:   During the course of the visit [name] was educated and counseled about the following appropriate screening and preventive services:   Vaccines to include Pneumoccal, Influenza, Td, Zostavax - will received influenza vaccine today.  All other vaccines are UTD  Colorectal cancer screening - FOBT is UTD; no longer required to get colonoscopy  Cardiovascular disease screening - UTD, Sees Dr Rayann Heman or [place] regularly for afib checked  Lipids are UTD and patient is taking statin  BP controlled  Diabetes screening - UTD   Bone Denisty / Osteoporosis Screening - Dexa rechecked today. Slight but non significant decrease in BMD of hip and spine.  Patient has declined bisphosphonates due to concerns about ONJ / she has dental implants.  Evista is also contraindicated due to history of stroke.  Continue calcium and MVI but advised to separate - MVI in am and Calcium in pm.  Continue vitamin D 2000 iU daily  Mammogram - next due 07/2016  PAP - no longer required  Glaucoma screening / Eye Exam -  UTD  Nutrition counseling - continue to eat a variety of fruits and vegetable, lean proteins and whole grains  Advanced Directives - UTD, copy requested  Physical Activity - great job, continue to exercise and dance 4 days per week  Fall Prevention Discussed  Try claritin 10mg  qd for allergy / runny nose or Flonase NS 1-2 spray each nostril daily    Patient Instructions (the written plan) were given to the patient.   Cherre Robins, PharmD   05/07/2016

## 2016-06-17 ENCOUNTER — Other Ambulatory Visit (HOSPITAL_COMMUNITY): Payer: Self-pay | Admitting: Nurse Practitioner

## 2016-07-05 ENCOUNTER — Other Ambulatory Visit: Payer: Self-pay | Admitting: Family Medicine

## 2016-07-05 DIAGNOSIS — Z1231 Encounter for screening mammogram for malignant neoplasm of breast: Secondary | ICD-10-CM

## 2016-07-18 ENCOUNTER — Other Ambulatory Visit: Payer: Self-pay | Admitting: Family Medicine

## 2016-08-08 ENCOUNTER — Ambulatory Visit (INDEPENDENT_AMBULATORY_CARE_PROVIDER_SITE_OTHER): Payer: Medicare Other

## 2016-08-08 ENCOUNTER — Encounter: Payer: Self-pay | Admitting: Pediatrics

## 2016-08-08 ENCOUNTER — Ambulatory Visit (INDEPENDENT_AMBULATORY_CARE_PROVIDER_SITE_OTHER): Payer: Medicare Other | Admitting: Pediatrics

## 2016-08-08 VITALS — BP 138/89 | HR 110 | Temp 98.6°F | Resp 18 | Ht 62.5 in | Wt 134.6 lb

## 2016-08-08 DIAGNOSIS — J019 Acute sinusitis, unspecified: Secondary | ICD-10-CM | POA: Diagnosis not present

## 2016-08-08 DIAGNOSIS — R05 Cough: Secondary | ICD-10-CM

## 2016-08-08 DIAGNOSIS — I480 Paroxysmal atrial fibrillation: Secondary | ICD-10-CM | POA: Diagnosis not present

## 2016-08-08 DIAGNOSIS — R059 Cough, unspecified: Secondary | ICD-10-CM

## 2016-08-08 MED ORDER — GUAIFENESIN-CODEINE 100-10 MG/5ML PO SOLN: 5.0000 mL | Freq: Three times a day (TID) | ORAL | 0 refills | Status: DC | PRN

## 2016-08-08 MED ORDER — AMOXICILLIN 500 MG PO CAPS: 500.0000 mg | ORAL_CAPSULE | Freq: Two times a day (BID) | ORAL | 0 refills | Status: DC

## 2016-08-08 NOTE — Patient Instructions (Addendum)
Take one extra cap of diltiazem at home today  Let me know if blood pressure is less than 120 on top  Let me know if heart rate is over 110 or less than 60  Start antibiotic if sinus pressure is not improving over next few days  Take cough medicine as needed at night  Come back within next 2 weeks for [place] to recheck your heart   If you have any shortness of breath, trouble breathing, dizziness, chest pressure or pain you need to be seen right away  Can take acetaminophen twice a day

## 2016-08-08 NOTE — Progress Notes (Signed)
  Subjective:   Patient ID: [name], female    DOB: 1931/02/12, 83 y.o.   MRN: [id number] CC: Chest Congestion; Hoarse; and Cough  HPI: [name] is a 83 y.o. female presenting for Chest Congestion; Hoarse; and Cough  Says has been coughing, had runny nose No fevers, has been feeling hot and cold Chest hurts when she coughs a lot but otherwise not having any chest pain No heart palpitations No ear pain No sore throat Appetite has been fine  No lightheadedness, dizziness No shortness of breath, no SOB with exertion Is moving around fine Says she feels fine other than cough and runny nose Is very active regularly going to senior center for exercise classes 3-4 times a week  Relevant past medical, surgical, family and social history reviewed. Allergies and medications reviewed and updated. History  Smoking Status  . Former Smoker  . Packs/day: 1.00  . Years: 30.00  . Types: Cigarettes  . Quit date: 08/05/1978  Smokeless Tobacco  . Never Used    Comment: quit in 1980s   ROS: Per HPI   Objective:    BP 138/89   Pulse (!) 110 Comment: Manuel  Temp 98.6 F (37 C) (Oral)   Resp 18   Ht 5' 2.5" (1.588 m)   Wt 134 lb 9.6 oz (61.1 kg)   SpO2 97%   BMI 24.23 kg/m   Wt Readings from Last 3 Encounters:  08/08/16 134 lb 9.6 oz (61.1 kg)  05/07/16 133 lb 8 oz (60.6 kg)  04/23/16 128 lb 9.6 oz (58.3 kg)    Gen: NAD, alert, cooperative with exam, NCAT, congested EYES: EOMI, no conjunctival injection, or no icterus ENT:  TMs injected b/l, no effusion, OP without erythema LYMPH: no cervical LAD CV: irregular, tachycardic, normal S1/S2, no murmur, distal pulses 2+ b/l Resp: CTABL, no wheezes, normal WOB, moving air well Ext: No edema, warm Neuro: Alert and oriented, strength equal b/l UE and LE, coordination grossly normal, normal gait MSK: normal muscle bulk  Assessment & Plan:  Ryelee was seen today for chest congestion, hoarse and  cough.  Diagnoses and all orders for this visit:  Cough CXR without infiltrate Likely due to acute URI Has been on codeine in the past to help with cough, wants to try again Take at night, may cause sleepiness -     DG Chest 2 View; Future -     guaiFENesin-codeine 100-10 MG/5ML syrup; Take 5 mLs by mouth 3 (three) times daily as needed for cough.  Paroxysmal atrial fibrillation (HCC) Afib with RVR when vitals first checked Asymptomatic, euvolemic, no SOB, CP, DOE, dizziness HR improved to 110 with rest Pt taking 180mg  long acting diltiazem at home Take extra tab today Cont to follow HR over next few days Gave call back parameters Return for recheck next week If any symptoms of palpitations, SOB, swelling, feeling worse needs to be seen immediately  Acute sinusitis, recurrence not specified, unspecified location Discussed symptomatic care If congestion not improving in next few days start below If feeling worse needs to be seen as above -     amoxicillin (AMOXIL) 500 MG capsule; Take 1 capsule (500 mg total) by mouth 2 (two) times daily.  Follow up plan: Return in 1-2 weeks (around 08/22/2016). Assunta Found, MD [place]

## 2016-08-09 ENCOUNTER — Ambulatory Visit: Payer: Medicare Other

## 2016-08-13 ENCOUNTER — Encounter: Payer: Self-pay | Admitting: Family Medicine

## 2016-08-13 ENCOUNTER — Ambulatory Visit (INDEPENDENT_AMBULATORY_CARE_PROVIDER_SITE_OTHER): Payer: Medicare Other | Admitting: Family Medicine

## 2016-08-13 VITALS — BP 119/82 | HR 90 | Temp 98.5°F | Ht 62.5 in | Wt 130.5 lb

## 2016-08-13 DIAGNOSIS — J209 Acute bronchitis, unspecified: Secondary | ICD-10-CM

## 2016-08-13 MED ORDER — PREDNISONE 20 MG PO TABS: ORAL_TABLET | ORAL | 0 refills | Status: DC

## 2016-08-13 MED ORDER — ALBUTEROL SULFATE HFA 108 (90 BASE) MCG/ACT IN AERS: 2.0000 | INHALATION_SPRAY | Freq: Four times a day (QID) | RESPIRATORY_TRACT | 0 refills | Status: DC | PRN

## 2016-08-13 NOTE — Progress Notes (Signed)
BP 119/82   Pulse 90   Temp 98.5 F (36.9 C) (Oral)   Ht 5' 2.5" (1.588 m)   Wt 130 lb 8 oz (59.2 kg)   BMI 23.49 kg/m    Subjective:    Patient ID: [name], female    DOB: 04-Oct-1930, 83 y.o.   MRN: [id number]  HPI: [name] is a 83 y.o. female presenting on 08/13/2016 for Cough (productive; leaving to go to the beach on Friday; started Amoxicillin & guaifenesin cough syrup started by Dr. Evette Doffing on Saturday; patient is concerned and wants to make sure she is ok); Sinusitis (runny nose); Chest congestion; and Blood Pressure Check (Dr. Evette Doffing had asked her to monitor BP and pulse, patient has brought record with her)   HPI Cough and congestion Having increasing cough and congestion over the past week. She was seen 3 days ago in her office and given amoxicillin which she has been using but does not seem that it's improving. She is also given a cough suppressant with guaifenesin and codeine and has helped a little bit but not completely. She is having some sinus drainage and some chest congestion and a cough that is mostly nonproductive but feels wet. She feels like she just can't clear the phlegm out with her cough. She denies any shortness of breath or fevers or chills.  Relevant past medical, surgical, family and social history reviewed and updated as indicated. Interim medical history since our last visit reviewed. Allergies and medications reviewed and updated.  Review of Systems  Constitutional: Negative for chills and fever.  HENT: Positive for congestion, postnasal drip, rhinorrhea, sinus pressure and sore throat. Negative for ear discharge, ear pain and sneezing.   Eyes: Negative for pain, redness and visual disturbance.  Respiratory: Positive for cough. Negative for chest tightness and shortness of breath.   Cardiovascular: Negative for chest pain and leg swelling.  Genitourinary: Negative for difficulty urinating and dysuria.  Musculoskeletal:  Negative for back pain and gait problem.  Skin: Negative for rash.  Neurological: Negative for light-headedness and headaches.  Psychiatric/Behavioral: Negative for agitation and behavioral problems.  All other systems reviewed and are negative.   Per HPI unless specifically indicated above     Objective:    BP 119/82   Pulse 90   Temp 98.5 F (36.9 C) (Oral)   Ht 5' 2.5" (1.588 m)   Wt 130 lb 8 oz (59.2 kg)   BMI 23.49 kg/m   Wt Readings from Last 3 Encounters:  08/13/16 130 lb 8 oz (59.2 kg)  08/08/16 134 lb 9.6 oz (61.1 kg)  05/07/16 133 lb 8 oz (60.6 kg)    Physical Exam  Constitutional: She is oriented to person, place, and time. She appears well-developed and well-nourished. No distress.  HENT:  Right Ear: Tympanic membrane, external ear and ear canal normal.  Left Ear: Tympanic membrane, external ear and ear canal normal.  Nose: Mucosal edema and rhinorrhea present. No epistaxis. Right sinus exhibits no maxillary sinus tenderness and no frontal sinus tenderness. Left sinus exhibits no maxillary sinus tenderness and no frontal sinus tenderness.  Mouth/Throat: Uvula is midline and mucous membranes are normal. Posterior oropharyngeal edema and posterior oropharyngeal erythema present. No oropharyngeal exudate or tonsillar abscesses.  Eyes: Conjunctivae are normal.  Cardiovascular: Normal rate, regular rhythm, normal heart sounds and intact distal pulses.   No murmur heard. Pulmonary/Chest: Effort normal. No respiratory distress. She has no wheezes. She has rhonchi. She has no rales.  Musculoskeletal: Normal range of motion. She exhibits no edema or tenderness.  Neurological: She is alert and oriented to person, place, and time. Coordination normal.  Skin: Skin is warm and dry. No rash noted. She is not diaphoretic.  Psychiatric: She has a normal mood and affect. Her behavior is normal.  Vitals reviewed.     Assessment & Plan:   Problem List Items Addressed This Visit       Respiratory   Acute bronchitis - Primary   Relevant Medications   predniSONE (DELTASONE) 20 MG tablet   albuterol (PROVENTIL HFA;VENTOLIN HFA) 108 (90 Base) MCG/ACT inhaler       Follow up plan: Return if symptoms worsen or fail to improve.  Counseling provided for all of the vaccine components No orders of the defined types were placed in this encounter.   Caryl Pina, MD [facility] 08/13/2016, 11:50 AM

## 2016-08-21 ENCOUNTER — Ambulatory Visit: Payer: Medicare Other | Admitting: Family Medicine

## 2016-08-29 ENCOUNTER — Ambulatory Visit (INDEPENDENT_AMBULATORY_CARE_PROVIDER_SITE_OTHER): Payer: Medicare Other | Admitting: Family Medicine

## 2016-08-29 ENCOUNTER — Encounter: Payer: Self-pay | Admitting: Family Medicine

## 2016-08-29 VITALS — BP 122/78 | HR 72 | Temp 96.9°F | Ht 62.5 in | Wt 132.0 lb

## 2016-08-29 DIAGNOSIS — E559 Vitamin D deficiency, unspecified: Secondary | ICD-10-CM | POA: Diagnosis not present

## 2016-08-29 DIAGNOSIS — I480 Paroxysmal atrial fibrillation: Secondary | ICD-10-CM | POA: Diagnosis not present

## 2016-08-29 DIAGNOSIS — E079 Disorder of thyroid, unspecified: Secondary | ICD-10-CM

## 2016-08-29 DIAGNOSIS — E7800 Pure hypercholesterolemia, unspecified: Secondary | ICD-10-CM

## 2016-08-29 NOTE — Progress Notes (Signed)
Subjective:    Patient ID: [name], female    DOB: [birth year], 83 y.o.   MRN: [id number]  HPI Pt here for follow up and management of chronic medical problems which includes hyperlipidemia and a fib. She is taking medication regularly.The patient has had recent cough and congestion that seems to be improving from this. She is due to to be given an FOBT today to return and will get lab work today. She sees a cardiologist regularly. The patient denies any chest pain shortness of breath trouble swallowing heartburn indigestion nausea vomiting diarrhea or blood in the stool. She is doing better with her cough and congestion. She will continue after our initial discussion of using Mucinex and saline nose spray. She is gradually increasing her activity. She doesn't have an appointment with the cardiologist until next fall and is asking if possible to see the cardiologist that comes here to [facility] so that she doesn't have to drive to [place].    Patient Active Problem List   Diagnosis Date Noted  . AV malformation of gastrointestinal tract   . Acute bronchitis 04/06/2015  . Primary osteoarthritis involving multiple joints 02/07/2015  . High risk medication use 06/21/2013  . Dizziness   . Restless leg syndrome 03/01/2013  . ATRIAL FIBRILLATION 11/03/2008  . Hyperlipidemia 10/24/2008  . ANEMIA, IRON DEFICIENCY, MICROCYTIC 10/24/2008  . SINUS BRADYCARDIA 10/24/2008  . CVA 10/24/2008  . OSTEOPENIA 10/24/2008   Outpatient Encounter Prescriptions as of 08/29/2016  Medication Sig  . acetaminophen (TYLENOL) 500 MG tablet Take 1-2 tablets (500-1,000 mg total) by mouth 3 (three) times daily as needed (arthritis pain).  Marland Kitchen atorvastatin (LIPITOR) 10 MG tablet TAKE 1/2 TABLET BY MOUTH ONCE A DAY OR AS INSTRUCTED BY PHYSICIAN  . Calcium Carbonate-Vit D-Min 1200-1000 MG-UNIT CHEW Chew 1 tablet by mouth daily.  . chlorhexidine (PERIDEX) 0.12 % solution Mouthwash - Use as directed  .  Cholecalciferol (VITAMIN D3) 1000 UNITS CAPS Take 1 capsule by mouth daily.  Marland Kitchen diltiazem (CARDIZEM CD) 180 MG 24 hr capsule TAKE 1 CAPSULE (180 MG TOTAL) BY MOUTH DAILY.  . folic acid (FOLVITE) 1 MG tablet Take 1 mg by mouth daily.  Marland Kitchen ipratropium (ATROVENT) 0.06 % nasal spray PLACE 1 SPRAY INTO EACH NOSTRIL TWICE A DAY AS NEEDED  . Magnesium 500 MG TABS Take 1 tablet by mouth daily.  . Multiple Vitamin (MULTIVITAMIN) capsule Take 1 capsule by mouth daily.    Alveda Reasons 15 MG TABS tablet TAKE 1 TABLET (15 MG TOTAL) BY MOUTH DAILY.  . [DISCONTINUED] albuterol (PROVENTIL HFA;VENTOLIN HFA) 108 (90 Base) MCG/ACT inhaler Inhale 2 puffs into the lungs every 6 (six) hours as needed for wheezing or shortness of breath.  . [DISCONTINUED] amoxicillin (AMOXIL) 500 MG capsule Take 1 capsule (500 mg total) by mouth 2 (two) times daily.  . [DISCONTINUED] guaiFENesin-codeine 100-10 MG/5ML syrup Take 5 mLs by mouth 3 (three) times daily as needed for cough.  . [DISCONTINUED] predniSONE (DELTASONE) 20 MG tablet 2 po at same time daily for 5 days   No facility-administered encounter medications on file as of 08/29/2016.       Review of Systems  Constitutional: Negative.   HENT: Positive for postnasal drip.   Eyes: Negative.   Respiratory: Positive for cough (better).   Cardiovascular: Negative.   Gastrointestinal: Negative.   Endocrine: Negative.   Genitourinary: Negative.   Musculoskeletal: Negative.   Skin: Negative.   Allergic/Immunologic: Negative.   Neurological: Negative.   Hematological: Negative.  Psychiatric/Behavioral: Negative.        Objective:   Physical Exam  Constitutional: She is oriented to person, place, and time. She appears well-developed and well-nourished.  HENT:  Head: Normocephalic and atraumatic.  Right Ear: External ear normal.  Left Ear: External ear normal.  Nose: Nose normal.  Mouth/Throat: Oropharynx is clear and moist. No oropharyngeal exudate.  Eyes:  Conjunctivae and EOM are normal. Pupils are equal, round, and reactive to light. Right eye exhibits no discharge. Left eye exhibits no discharge. No scleral icterus.  Neck: Normal range of motion. Neck supple. No thyromegaly present.  No bruits thyromegaly or anterior cervical adenopathy  Cardiovascular: Normal rate and intact distal pulses.   No murmur heard. The heart remains irregular irregular at 96/m  Pulmonary/Chest: Effort normal and breath sounds normal. No respiratory distress. She has no wheezes. She has no rales.  Clear anteriorly and posteriorly without wheezes or rales  Abdominal: Soft. Bowel sounds are normal. She exhibits no mass. There is no tenderness. There is no rebound and no guarding.  No abdominal tenderness masses or bruits  Musculoskeletal: Normal range of motion. She exhibits no edema.  Lymphadenopathy:    She has no cervical adenopathy.  Neurological: She is alert and oriented to person, place, and time. She has normal reflexes. No cranial nerve deficit.  Skin: Skin is warm and dry. No rash noted.  Psychiatric: She has a normal mood and affect. Her behavior is normal. Judgment and thought content normal.  Nursing note and vitals reviewed.  BP 122/78 (BP Location: Left Arm)   Pulse 72   Temp (!) 96.9 F (36.1 C) (Oral)   Ht 5' 2.5" (1.588 m)   Wt 132 lb (59.9 kg)   BMI 23.76 kg/m         Assessment & Plan:  1. Paroxysmal atrial fibrillation (HCC) -The patient is in atrial fibrillation today at 96/m. She is active. She is been dancing. She continues to do this. She's not had any lightheaded episodes or spells where she feels like she is going to pass out. She is due to see the cardiologist again sometime in March. - CBC with Differential/Platelet - Ambulatory referral to Cardiology  2. Pure hypercholesterolemia -Continue current treatment pending results of lab work - CBC with Differential/Platelet - BMP8+EGFR - Hepatic function panel - Lipid panel -  Ambulatory referral to Cardiology  3. Thyroid disease -Continue current treatment pending results of lab work - CBC with Differential/Platelet  4. Vitamin D deficiency -Continue vitamin D replacement and practice fall prevention - CBC with Differential/Platelet - VITAMIN D 25 Hydroxy (Vit-D Deficiency, Fractures)  Patient Instructions                       Medicare Annual Wellness Visit  [facility] and the medical providers at [facility] strive to bring you the best medical care.  In doing so we not only want to address your current medical conditions and concerns but also to detect new conditions early and prevent illness, disease and health-related problems.    Medicare offers a yearly Wellness Visit which allows our clinical staff to assess your need for preventative services including immunizations, lifestyle education, counseling to decrease risk of preventable diseases and screening for fall risk and other medical concerns.    This visit is provided free of charge (no copay) for all Medicare recipients. The clinical pharmacists at [facility] have begun to conduct these Wellness Visits which  will also include a thorough review of all your medications.    As you primary medical provider recommend that you make an appointment for your Annual Wellness Visit if you have not done so already this year.  You may set up this appointment before you leave today or you may call back [phone]) and schedule an appointment.  Please make sure when you call that you mention that you are scheduling your Annual Wellness Visit with the clinical pharmacist so that the appointment may be made for the proper length of time.     Continue current medications. Continue good therapeutic lifestyle changes which include good diet and exercise. Fall precautions discussed with patient. If an FOBT was given today- please return it to our front desk. If you are over  34 years old - you may need Prevnar 56 or the adult Pneumonia vaccine.  **Flu shots are available--- please call and schedule a FLU-CLINIC appointment**  After your visit with [place] today you will receive a survey in the mail or online from [employer] regarding your care with [place]. Please take a moment to fill this out. Your feedback is very important to [place] as you can help [place] better understand your patient needs as well as improve your experience and satisfaction. WE CARE ABOUT YOU!!!   We will arrange for you to have an appointment with the cardiologist that comes to [facility] at your request so that you do not have to be on the road driving back and forth to [place]. Continue to drink plenty of fluids and stay well hydrated Continue the Mucinex at least for another month and use nasal saline Keep the house as cool as possible Always be careful and do not put yourself at risk for falling Practice good hand hygiene and respiratory protection  Arrie Senate MD

## 2016-08-29 NOTE — Patient Instructions (Addendum)
Medicare Annual Wellness Visit  [facility] and the medical providers at [facility] strive to bring you the best medical care.  In doing so we not only want to address your current medical conditions and concerns but also to detect new conditions early and prevent illness, disease and health-related problems.    Medicare offers a yearly Wellness Visit which allows our clinical staff to assess your need for preventative services including immunizations, lifestyle education, counseling to decrease risk of preventable diseases and screening for fall risk and other medical concerns.    This visit is provided free of charge (no copay) for all Medicare recipients. The clinical pharmacists at [facility] have begun to conduct these Wellness Visits which will also include a thorough review of all your medications.    As you primary medical provider recommend that you make an appointment for your Annual Wellness Visit if you have not done so already this year.  You may set up this appointment before you leave today or you may call back [phone] and schedule an appointment.  Please make sure when you call that you mention that you are scheduling your Annual Wellness Visit with the clinical pharmacist so that the appointment may be made for the proper length of time.     Continue current medications. Continue good therapeutic lifestyle changes which include good diet and exercise. Fall precautions discussed with patient. If an FOBT was given today- please return it to our front desk. If you are over 59 years old - you may need Prevnar 3 or the adult Pneumonia vaccine.  **Flu shots are available--- please call and schedule a FLU-CLINIC appointment**  After your visit with [place] today you will receive a survey in the mail or online from [employer] regarding your care with [place]. Please take a moment to fill this out. Your feedback is very  important to [place] as you can help [place] better understand your patient needs as well as improve your experience and satisfaction. WE CARE ABOUT YOU!!!   We will arrange for you to have an appointment with the cardiologist that comes to [facility] at your request so that you do not have to be on the road driving back and forth to [place]. Continue to drink plenty of fluids and stay well hydrated Continue the Mucinex at least for another month and use nasal saline Keep the house as cool as possible Always be careful and do not put yourself at risk for falling Practice good hand hygiene and respiratory protection

## 2016-08-30 LAB — CBC WITH DIFFERENTIAL/PLATELET
BASOS ABS: 0 10*3/uL (ref 0.0–0.2)
Basos: 0 %
EOS (ABSOLUTE): 0.1 10*3/uL (ref 0.0–0.4)
Eos: 1 %
Hematocrit: 37.5 % (ref 34.0–46.6)
Hemoglobin: 12.6 g/dL (ref 11.1–15.9)
Immature Grans (Abs): 0 10*3/uL (ref 0.0–0.1)
Immature Granulocytes: 0 %
LYMPHS ABS: 2.4 10*3/uL (ref 0.7–3.1)
Lymphs: 23 %
MCH: 31 pg (ref 26.6–33.0)
MCHC: 33.6 g/dL (ref 31.5–35.7)
MCV: 92 fL (ref 79–97)
Monocytes Absolute: 1.2 10*3/uL — ABNORMAL HIGH (ref 0.1–0.9)
Monocytes: 12 %
NEUTROS ABS: 6.9 10*3/uL (ref 1.4–7.0)
Neutrophils: 64 %
Platelets: 243 10*3/uL (ref 150–379)
RBC: 4.07 x10E6/uL (ref 3.77–5.28)
RDW: 14 % (ref 12.3–15.4)
WBC: 10.6 10*3/uL (ref 3.4–10.8)

## 2016-08-30 LAB — BMP8+EGFR
BUN / CREAT RATIO: 13 (ref 12–28)
BUN: 12 mg/dL (ref 8–27)
CO2: 25 mmol/L (ref 18–29)
CREATININE: 0.94 mg/dL (ref 0.57–1.00)
Calcium: 9.3 mg/dL (ref 8.7–10.3)
Chloride: 101 mmol/L (ref 96–106)
GFR calc non Af Amer: 56 mL/min/{1.73_m2} — ABNORMAL LOW (ref >59)
GFR, EST AFRICAN AMERICAN: 65 mL/min/{1.73_m2} (ref >59)
Glucose: 82 mg/dL (ref 65–99)
Potassium: 4.6 mmol/L (ref 3.5–5.2)
SODIUM: 141 mmol/L (ref 134–144)

## 2016-08-30 LAB — HEPATIC FUNCTION PANEL
ALK PHOS: 71 IU/L (ref 39–117)
ALT: 13 IU/L (ref 0–32)
AST: 23 IU/L (ref 0–40)
Albumin: 3.8 g/dL (ref 3.5–4.7)
Bilirubin Total: 0.4 mg/dL (ref 0.0–1.2)
Bilirubin, Direct: 0.17 mg/dL (ref 0.00–0.40)
TOTAL PROTEIN: 6.7 g/dL (ref 6.0–8.5)

## 2016-08-30 LAB — LIPID PANEL
CHOLESTEROL TOTAL: 159 mg/dL (ref 100–199)
Chol/HDL Ratio: 2.9 ratio units (ref 0.0–4.4)
HDL: 54 mg/dL (ref >39)
LDL CALC: 83 mg/dL (ref 0–99)
TRIGLYCERIDES: 110 mg/dL (ref 0–149)
VLDL Cholesterol Cal: 22 mg/dL (ref 5–40)

## 2016-08-30 LAB — VITAMIN D 25 HYDROXY (VIT D DEFICIENCY, FRACTURES): Vit D, 25-Hydroxy: 54 ng/mL (ref 30.0–100.0)

## 2016-09-02 ENCOUNTER — Other Ambulatory Visit: Payer: Medicare Other

## 2016-09-02 DIAGNOSIS — Z1211 Encounter for screening for malignant neoplasm of colon: Secondary | ICD-10-CM

## 2016-09-03 LAB — FECAL OCCULT BLOOD, IMMUNOCHEMICAL: FECAL OCCULT BLD: NEGATIVE

## 2016-09-10 ENCOUNTER — Ambulatory Visit: Payer: Medicare Other

## 2016-10-04 ENCOUNTER — Ambulatory Visit: Payer: Medicare Other

## 2016-10-22 ENCOUNTER — Ambulatory Visit (HOSPITAL_COMMUNITY): Payer: Medicare Other | Admitting: Nurse Practitioner

## 2016-10-22 ENCOUNTER — Ambulatory Visit
Admission: RE | Admit: 2016-10-22 | Discharge: 2016-10-22 | Disposition: A | Discharge status: Home / Self Care | Payer: Medicare Other | Source: Ambulatory Visit | Attending: Family Medicine | Admitting: Family Medicine

## 2016-10-22 DIAGNOSIS — Z1231 Encounter for screening mammogram for malignant neoplasm of breast: Secondary | ICD-10-CM

## 2016-10-22 NOTE — Progress Notes (Signed)
Cardiology Office Note   Date:  10/24/2016   ID:  [name], DOB Mar 05, 1931, MRN [id number]  PCP:  [name], MD  Cardiologist:   Minus Breeding, MD   Chief Complaint  Patient presents with  . Atrial Fibrillation      History of Present Illness: [name] is a 83 y.o. female who presents for follow up of atrial fib.  I saw her some years ago. She was previously seen by Dr. Rayann Heman.   She has chronic atrial fib.  She is managed with rate control and anticoagulation.  She does very well.  She exercises and dances.  The patient denies any new symptoms such as chest discomfort, neck or arm discomfort. There has been no new shortness of breath, PND or orthopnea. There have been no reported palpitations, presyncope or syncope.  Of note she did have some AVMs in the past but does not have any active bleeding issues and has tolerated low dose of Xarelto for some time.    Past Medical History:  Diagnosis Date  . Arthritis   . AV malformation of gastrointestinal tract    in cecum she denies history of GIB  . Breast mass, right   . Cataract   . CVA (cerebral vascular accident) ([place]) 2007  . Dizziness    denies this as an ongoing problem  . Hyperlipidemia   . Microcytic anemia   . Osteopenia   . Persistent atrial fibrillation ([place])    s/p PVI 04/2010  CHADS2vasc at least 5  . RLS (restless legs syndrome)   . Sinus bradycardia     Past Surgical History:  Procedure Laterality Date  . afib ablation  04/2010   PVI with CTI ablation performed by [facility]  . BREAST EXCISIONAL BIOPSY Right    benign cyst  . breast mass resected    . CARDIOVERSION     for afib  . CATARACT EXTRACTION W/PHACO  11/11/2011   Procedure: CATARACT EXTRACTION PHACO AND INTRAOCULAR LENS PLACEMENT (IOC);  Surgeon: Tonny Branch, MD;  Location: [facility];  Service: Ophthalmology;  Laterality: Left;  CDE:12.62  . CATARACT EXTRACTION W/PHACO  11/25/2011   Procedure: CATARACT EXTRACTION PHACO AND  INTRAOCULAR LENS PLACEMENT (IOC);  Surgeon: Tonny Branch, MD;  Location: [facility];  Service: Ophthalmology;  Laterality: Right;  CDE 16.14  . dental inplants       Current Outpatient Prescriptions  Medication Sig Dispense Refill  . acetaminophen (TYLENOL) 500 MG tablet Take 1-2 tablets (500-1,000 mg total) by mouth 3 (three) times daily as needed (arthritis pain). 30 tablet 0  . atorvastatin (LIPITOR) 10 MG tablet TAKE 1/2 TABLET BY MOUTH ONCE A DAY OR AS INSTRUCTED BY PHYSICIAN 45 tablet 3  . Calcium Carbonate-Vit D-Min 1200-1000 MG-UNIT CHEW Chew 1 tablet by mouth daily.    . chlorhexidine (PERIDEX) 0.12 % solution Mouthwash - Use as directed    . Cholecalciferol (VITAMIN D3) 1000 UNITS CAPS Take 1 capsule by mouth daily.    Marland Kitchen diltiazem (CARDIZEM CD) 180 MG 24 hr capsule TAKE 1 CAPSULE (180 MG TOTAL) BY MOUTH DAILY. 90 capsule 3  . folic acid (FOLVITE) 1 MG tablet Take 1 mg by mouth daily.    Marland Kitchen ipratropium (ATROVENT) 0.06 % nasal spray PLACE 1 SPRAY INTO EACH NOSTRIL TWICE A DAY AS NEEDED 15 mL 5  . Magnesium 500 MG TABS Take 1 tablet by mouth daily.    . Multiple Vitamin (MULTIVITAMIN) capsule Take 1 capsule by mouth daily.      Marland Kitchen  XARELTO 15 MG TABS tablet TAKE 1 TABLET (15 MG TOTAL) BY MOUTH DAILY. 30 tablet 6   No current facility-administered medications for this visit.     Allergies:   Patient has no known allergies.    ROS:  Please see the history of present illness.   Otherwise, review of systems are positive for none.   All other systems are reviewed and negative.    PHYSICAL EXAM: VS:  BP 128/70   Pulse 82   Ht 5' 3.5" (1.613 m)   Wt 134 lb (60.8 kg)   BMI 23.36 kg/m  , BMI Body mass index is 23.36 kg/m. GENERAL:  Well appearing HEENT:  Pupils equal round and reactive, fundi not visualized, oral mucosa unremarkable NECK:  No jugular venous distention, waveform within normal limits, carotid upstroke brisk and symmetric, no bruits, no thyromegaly LYMPHATICS:  No cervical,  inguinal adenopathy LUNGS:  Clear to auscultation bilaterally BACK:  No CVA tenderness CHEST:  Unremarkable HEART:  PMI not displaced or sustained,S1 and S2 within normal limits, no S3, no clicks, no rubs, no murmurs, irregular ABD:  Flat, positive bowel sounds normal in frequency in pitch, no bruits, no rebound, no guarding, no midline pulsatile mass, no hepatomegaly, no splenomegaly EXT:  2 plus pulses throughout, no edema, no cyanosis no clubbing SKIN:  No rashes no nodules NEURO:  Cranial nerves II through XII grossly intact, motor grossly intact throughout PSYCH:  Cognitively intact, oriented to person place and time    EKG:  EKG is ordered today. The ekg ordered today demonstrates atrial fibrillation, rate 82, axis within normal limits, intervals within normal limits, poor anterior R wave progression, no acute ST-T wave changes.   Recent Labs: 04/19/2016: TSH 2.690 08/29/2016: ALT 13; BUN 12; Creatinine, Ser 0.94; Platelets 243; Potassium 4.6; Sodium 141    Lipid Panel    Component Value Date/Time   CHOL 159 08/29/2016 1430   CHOL 140 07/02/2013 1013   TRIG 110 08/29/2016 1430   TRIG 73 04/19/2016 0815   TRIG 71 07/02/2013 1013   HDL 54 08/29/2016 1430   HDL 62 04/19/2016 0815   HDL 61 07/02/2013 1013   CHOLHDL 2.9 08/29/2016 1430   LDLCALC 83 08/29/2016 1430   LDLCALC 76 01/17/2014 0914   LDLCALC 65 07/02/2013 1013      Wt Readings from Last 3 Encounters:  10/23/16 134 lb (60.8 kg)  08/29/16 132 lb (59.9 kg)  08/13/16 130 lb 8 oz (59.2 kg)      Other studies Reviewed: Additional studies/ records that were reviewed today include: Extensive review of office records. Review of the above records demonstrates:  Please see elsewhere in the note.     ASSESSMENT AND PLAN:  Permanent atrial fibrillation: She tolerates rate control and anticoagulation.  Ms. [name] has a CHA2DS2 - VASc score of 5.  I reviewed the notes extensively and conferred with  the atrial fib clinic.   She is on the reduced dose secondary to her reduced GFR.     Current medicines are reviewed at length with the patient today.  The patient does not have concerns regarding medicines.  The following changes have been made:    Labs/ tests ordered today include: None  Orders Placed This Encounter  Procedures  . EKG 12-Lead     Disposition:   FU with me in 12 months.     Signed, Minus Breeding, MD  10/24/2016 5:34 PM    [facility]

## 2016-10-23 ENCOUNTER — Encounter: Payer: Self-pay | Admitting: Cardiology

## 2016-10-23 ENCOUNTER — Ambulatory Visit (INDEPENDENT_AMBULATORY_CARE_PROVIDER_SITE_OTHER): Payer: Medicare Other | Admitting: Cardiology

## 2016-10-23 VITALS — BP 128/70 | HR 82 | Ht 63.5 in | Wt 134.0 lb

## 2016-10-23 DIAGNOSIS — I482 Chronic atrial fibrillation: Secondary | ICD-10-CM

## 2016-10-23 DIAGNOSIS — I4821 Permanent atrial fibrillation: Secondary | ICD-10-CM

## 2016-10-23 NOTE — Patient Instructions (Signed)

## 2016-10-24 ENCOUNTER — Encounter: Payer: Self-pay | Admitting: Cardiology

## 2016-10-30 ENCOUNTER — Telehealth: Payer: Self-pay | Admitting: *Deleted

## 2016-10-30 NOTE — Telephone Encounter (Signed)
-----   Message from Minus Breeding, MD sent at 10/24/2016  5:33 PM EDT ----- Please let her know she is on the correct dose of Xarelto.

## 2016-10-30 NOTE — Telephone Encounter (Signed)
Left message for pt to c/b to discuss her Xarelto.

## 2016-10-31 ENCOUNTER — Other Ambulatory Visit (HOSPITAL_COMMUNITY): Payer: Self-pay | Admitting: Nurse Practitioner

## 2016-10-31 NOTE — Telephone Encounter (Signed)
Pt aware to continue medications as listed.

## 2017-01-02 ENCOUNTER — Other Ambulatory Visit: Payer: Self-pay | Admitting: Family Medicine

## 2017-01-02 ENCOUNTER — Ambulatory Visit (INDEPENDENT_AMBULATORY_CARE_PROVIDER_SITE_OTHER): Payer: Medicare Other | Admitting: Family Medicine

## 2017-01-02 ENCOUNTER — Encounter: Payer: Self-pay | Admitting: Family Medicine

## 2017-01-02 VITALS — BP 108/62 | HR 88 | Temp 97.6°F | Ht 63.5 in | Wt 133.0 lb

## 2017-01-02 DIAGNOSIS — I480 Paroxysmal atrial fibrillation: Secondary | ICD-10-CM | POA: Diagnosis not present

## 2017-01-02 DIAGNOSIS — D649 Anemia, unspecified: Secondary | ICD-10-CM | POA: Diagnosis not present

## 2017-01-02 DIAGNOSIS — E559 Vitamin D deficiency, unspecified: Secondary | ICD-10-CM | POA: Diagnosis not present

## 2017-01-02 DIAGNOSIS — E7800 Pure hypercholesterolemia, unspecified: Secondary | ICD-10-CM

## 2017-01-02 DIAGNOSIS — G2581 Restless legs syndrome: Secondary | ICD-10-CM

## 2017-01-02 DIAGNOSIS — E079 Disorder of thyroid, unspecified: Secondary | ICD-10-CM

## 2017-01-02 NOTE — Progress Notes (Signed)
Subjective:    Patient ID: [name], female    DOB: [birth year], 83 y.o.   MRN: [id number]  HPI Pt here for follow up and management of chronic medical problems which includes hyperlipidemia and a fib. She is taking medication regularly.The patient is wondering today if she has restless legs syndrome she also complains of a sore in her nose. She is not needing any refills. She is due to get lab work and will return fasting for this. The patient stays active. She just because of pain in the senior games in [place]. She says that her left leg especially bothers her when she sits down in the evening and at night time and just can't seem to settle down. She took some folic acid in the past and this seemed to help that she is taking 1 mg of folic acid daily now but she is having more trouble with her leg. She also complains of a sore in the right vestibule of the right nostril that seems to be better currently. She denies any chest pain pressure or shortness of breath. She denies any trouble with her stomach including nausea vomiting diarrhea blood in the stool or black tarry bowel movements. She is passing her water without problems. She sees a cardiologist on a yearly basis here in [place]. She is up-to-date on her eye exams.     Patient Active Problem List   Diagnosis Date Noted  . AV malformation of gastrointestinal tract   . Acute bronchitis 04/06/2015  . Primary osteoarthritis involving multiple joints 02/07/2015  . High risk medication use 06/21/2013  . Dizziness   . Restless leg syndrome 03/01/2013  . ATRIAL FIBRILLATION 11/03/2008  . Hyperlipidemia 10/24/2008  . ANEMIA, IRON DEFICIENCY, MICROCYTIC 10/24/2008  . SINUS BRADYCARDIA 10/24/2008  . CVA 10/24/2008  . OSTEOPENIA 10/24/2008   Outpatient Encounter Prescriptions as of 01/02/2017  Medication Sig  . acetaminophen (TYLENOL) 500 MG tablet Take 1-2 tablets (500-1,000 mg total) by mouth 3 (three) times daily as needed  (arthritis pain).  Marland Kitchen atorvastatin (LIPITOR) 10 MG tablet TAKE 1/2 TABLET BY MOUTH ONCE A DAY OR AS INSTRUCTED BY PHYSICIAN  . Calcium Carbonate-Vit D-Min 1200-1000 MG-UNIT CHEW Chew 1 tablet by mouth daily.  . chlorhexidine (PERIDEX) 0.12 % solution Mouthwash - Use as directed  . Cholecalciferol (VITAMIN D3) 1000 UNITS CAPS Take 1 capsule by mouth daily.  Marland Kitchen diltiazem (CARDIZEM CD) 180 MG 24 hr capsule TAKE 1 CAPSULE (180 MG TOTAL) BY MOUTH DAILY.  . folic acid (FOLVITE) 1 MG tablet Take 1 mg by mouth daily.  Marland Kitchen ipratropium (ATROVENT) 0.06 % nasal spray PLACE 1 SPRAY INTO EACH NOSTRIL TWICE A DAY AS NEEDED  . Magnesium 500 MG TABS Take 1 tablet by mouth daily.  . Multiple Vitamin (MULTIVITAMIN) capsule Take 1 capsule by mouth daily.    Alveda Reasons 15 MG TABS tablet TAKE 1 TABLET (15 MG TOTAL) BY MOUTH DAILY.   No facility-administered encounter medications on file as of 01/02/2017.       Review of Systems  Constitutional: Negative.   HENT: Negative.        Sore in nose  Eyes: Negative.   Respiratory: Negative.   Cardiovascular: Negative.   Gastrointestinal: Negative.   Endocrine: Negative.   Genitourinary: Negative.   Musculoskeletal: Negative.        Restless legs  Skin: Negative.   Allergic/Immunologic: Negative.   Neurological: Negative.   Hematological: Negative.   Psychiatric/Behavioral: Negative.  Objective:   Physical Exam  Constitutional: She is oriented to person, place, and time. She appears well-developed and well-nourished.  Pleasant alert and looks much younger than her stated age of 60  HENT:  Head: Normocephalic and atraumatic.  Right Ear: External ear normal.  Left Ear: External ear normal.  Nose: Nose normal.  Mouth/Throat: Oropharynx is clear and moist. No oropharyngeal exudate.  Bilateral hearing aids  Eyes: Conjunctivae and EOM are normal. Pupils are equal, round, and reactive to light. Right eye exhibits no discharge. Left eye exhibits no  discharge. No scleral icterus.  Neck: Normal range of motion. Neck supple. No thyromegaly present.  No bruits thyromegaly or anterior cervical adenopathy  Cardiovascular: Normal rate, normal heart sounds and intact distal pulses.   No murmur heard. The heart is irregular irregular at 84/m  Pulmonary/Chest: Effort normal and breath sounds normal. No respiratory distress. She has no wheezes. She has no rales. She exhibits no tenderness.  Clear anteriorly and posteriorly  Abdominal: Soft. Bowel sounds are normal. She exhibits no mass. There is no tenderness. There is no rebound and no guarding.  No abdominal tenderness masses bruits or organ enlargement  Musculoskeletal: Normal range of motion. She exhibits no edema.  Lymphadenopathy:    She has no cervical adenopathy.  Neurological: She is alert and oriented to person, place, and time. She has normal reflexes. No cranial nerve deficit.  Skin: Skin is warm and dry. No rash noted.  Psychiatric: She has a normal mood and affect. Her behavior is normal. Judgment and thought content normal.  Nursing note reviewed.  BP 108/62 (BP Location: Left Arm)   Pulse 88   Temp 97.6 F (36.4 C) (Oral)   Ht 5' 3.5" (1.613 m)   Wt 133 lb (60.3 kg)   BMI 23.19 kg/m         Assessment & Plan:  1. Pure hypercholesterolemia -Continue with aggressive therapeutic lifestyle changes and current statin drug - BMP8+EGFR; Future - CBC with Differential/Platelet; Future - Hepatic function panel; Future - Lipid panel; Future  2. Paroxysmal atrial fibrillation (HCC) -Continue with regular follow-up with cardiology - CBC with Differential/Platelet; Future  3. Thyroid disease -Continue with current treatment pending results of lab work - CBC with Differential/Platelet; Future - Thyroid Panel With TSH; Future  4. Vitamin D deficiency -Continue vitamin D replacement - CBC with Differential/Platelet; Future - VITAMIN D 25 Hydroxy (Vit-D Deficiency,  Fractures); Future  5. Restless leg -Check serum iron and folic acid and if no positive findings for this consider Lyrica  Patient Instructions                       Medicare Annual Wellness Visit  [facility] and the medical providers at [facility] strive to bring you the best medical care.  In doing so we not only want to address your current medical conditions and concerns but also to detect new conditions early and prevent illness, disease and health-related problems.    Medicare offers a yearly Wellness Visit which allows our clinical staff to assess your need for preventative services including immunizations, lifestyle education, counseling to decrease risk of preventable diseases and screening for fall risk and other medical concerns.    This visit is provided free of charge (no copay) for all Medicare recipients. The clinical pharmacists at [facility] have begun to conduct these Wellness Visits which will also include a thorough review of all your medications.  As you primary medical provider recommend that you make an appointment for your Annual Wellness Visit if you have not done so already this year.  You may set up this appointment before you leave today or you may call back ([phone]) and schedule an appointment.  Please make sure when you call that you mention that you are scheduling your Annual Wellness Visit with the clinical pharmacist so that the appointment may be made for the proper length of time.     Continue current medications. Continue good therapeutic lifestyle changes which include good diet and exercise. Fall precautions discussed with patient. If an FOBT was given today- please return it to our front desk. If you are over 73 years old - you may need Prevnar 70 or the adult Pneumonia vaccine.  **Flu shots are available--- please call and schedule a FLU-CLINIC appointment**  After your visit with [place] today you will  receive a survey in the mail or online from [employer] regarding your care with [place]. Please take a moment to fill this out. Your feedback is very important to [place] as you can help [place] better understand your patient needs as well as improve your experience and satisfaction. WE CARE ABOUT YOU!!!   Use Polysporin ointment if you develop a sore in the nose We will call your lab work results as soon as they become available We will consider trying medicine for restless leg syndrome if all of your lab work turns out okay. Continue to stay active and drink plenty of water and fluids Be sure and keep follow-up appointment with cardiology  Arrie Senate MD

## 2017-01-02 NOTE — Patient Instructions (Addendum)
Medicare Annual Wellness Visit  [facility] and the medical providers at [facility] strive to bring you the best medical care.  In doing so we not only want to address your current medical conditions and concerns but also to detect new conditions early and prevent illness, disease and health-related problems.    Medicare offers a yearly Wellness Visit which allows our clinical staff to assess your need for preventative services including immunizations, lifestyle education, counseling to decrease risk of preventable diseases and screening for fall risk and other medical concerns.    This visit is provided free of charge (no copay) for all Medicare recipients. The clinical pharmacists at [facility] have begun to conduct these Wellness Visits which will also include a thorough review of all your medications.    As you primary medical provider recommend that you make an appointment for your Annual Wellness Visit if you have not done so already this year.  You may set up this appointment before you leave today or you may call back ([phone]) and schedule an appointment.  Please make sure when you call that you mention that you are scheduling your Annual Wellness Visit with the clinical pharmacist so that the appointment may be made for the proper length of time.     Continue current medications. Continue good therapeutic lifestyle changes which include good diet and exercise. Fall precautions discussed with patient. If an FOBT was given today- please return it to our front desk. If you are over 49 years old - you may need Prevnar 74 or the adult Pneumonia vaccine.  **Flu shots are available--- please call and schedule a FLU-CLINIC appointment**  After your visit with [place] today you will receive a survey in the mail or online from [employer] regarding your care with [place]. Please take a moment to fill this out. Your feedback is very  important to [place] as you can help [place] better understand your patient needs as well as improve your experience and satisfaction. WE CARE ABOUT YOU!!!   Use Polysporin ointment if you develop a sore in the nose We will call your lab work results as soon as they become available We will consider trying medicine for restless leg syndrome if all of your lab work turns out okay. Continue to stay active and drink plenty of water and fluids Be sure and keep follow-up appointment with cardiology

## 2017-01-02 NOTE — Addendum Note (Signed)
Addended by: Zannie Cove on: 01/02/2017 03:31 PM   Modules accepted: Orders

## 2017-01-08 ENCOUNTER — Other Ambulatory Visit: Payer: Medicare Other

## 2017-01-08 DIAGNOSIS — E559 Vitamin D deficiency, unspecified: Secondary | ICD-10-CM

## 2017-01-08 DIAGNOSIS — E079 Disorder of thyroid, unspecified: Secondary | ICD-10-CM

## 2017-01-08 DIAGNOSIS — E7800 Pure hypercholesterolemia, unspecified: Secondary | ICD-10-CM

## 2017-01-08 DIAGNOSIS — D649 Anemia, unspecified: Secondary | ICD-10-CM

## 2017-01-08 DIAGNOSIS — I480 Paroxysmal atrial fibrillation: Secondary | ICD-10-CM

## 2017-01-09 LAB — BMP8+EGFR
BUN/Creatinine Ratio: 15 (ref 12–28)
BUN: 15 mg/dL (ref 8–27)
CALCIUM: 9.4 mg/dL (ref 8.7–10.3)
CHLORIDE: 103 mmol/L (ref 96–106)
CO2: 25 mmol/L (ref 18–29)
CREATININE: 1.02 mg/dL — AB (ref 0.57–1.00)
GFR calc non Af Amer: 51 mL/min/{1.73_m2} — ABNORMAL LOW (ref >59)
GFR, EST AFRICAN AMERICAN: 59 mL/min/{1.73_m2} — AB (ref >59)
Glucose: 81 mg/dL (ref 65–99)
Potassium: 5.1 mmol/L (ref 3.5–5.2)
Sodium: 142 mmol/L (ref 134–144)

## 2017-01-09 LAB — VITAMIN D 25 HYDROXY (VIT D DEFICIENCY, FRACTURES): Vit D, 25-Hydroxy: 45.9 ng/mL (ref 30.0–100.0)

## 2017-01-09 LAB — CBC WITH DIFFERENTIAL/PLATELET
BASOS ABS: 0 10*3/uL (ref 0.0–0.2)
Basos: 0 %
EOS (ABSOLUTE): 0.1 10*3/uL (ref 0.0–0.4)
EOS: 1 %
HEMOGLOBIN: 13 g/dL (ref 11.1–15.9)
Hematocrit: 39.7 % (ref 34.0–46.6)
IMMATURE GRANS (ABS): 0 10*3/uL (ref 0.0–0.1)
Immature Granulocytes: 0 %
LYMPHS: 36 %
Lymphocytes Absolute: 2.6 10*3/uL (ref 0.7–3.1)
MCH: 31.3 pg (ref 26.6–33.0)
MCHC: 32.7 g/dL (ref 31.5–35.7)
MCV: 96 fL (ref 79–97)
Monocytes Absolute: 0.8 10*3/uL (ref 0.1–0.9)
Monocytes: 11 %
NEUTROS ABS: 3.7 10*3/uL (ref 1.4–7.0)
Neutrophils: 52 %
Platelets: 280 10*3/uL (ref 150–379)
RBC: 4.15 x10E6/uL (ref 3.77–5.28)
RDW: 13.2 % (ref 12.3–15.4)
WBC: 7.2 10*3/uL (ref 3.4–10.8)

## 2017-01-09 LAB — HEPATIC FUNCTION PANEL
ALBUMIN: 4.2 g/dL (ref 3.5–4.7)
ALK PHOS: 73 IU/L (ref 39–117)
ALT: 9 IU/L (ref 0–32)
AST: 22 IU/L (ref 0–40)
BILIRUBIN, DIRECT: 0.15 mg/dL (ref 0.00–0.40)
Bilirubin Total: 0.5 mg/dL (ref 0.0–1.2)
TOTAL PROTEIN: 7 g/dL (ref 6.0–8.5)

## 2017-01-09 LAB — LIPID PANEL
Chol/HDL Ratio: 2.3 ratio (ref 0.0–4.4)
Cholesterol, Total: 135 mg/dL (ref 100–199)
HDL: 60 mg/dL (ref >39)
LDL CALC: 56 mg/dL (ref 0–99)
Triglycerides: 93 mg/dL (ref 0–149)
VLDL CHOLESTEROL CAL: 19 mg/dL (ref 5–40)

## 2017-01-09 LAB — THYROID PANEL WITH TSH
Free Thyroxine Index: 2.3 (ref 1.2–4.9)
T3 Uptake Ratio: 28 % (ref 24–39)
T4 TOTAL: 8.2 ug/dL (ref 4.5–12.0)
TSH: 3.14 u[IU]/mL (ref 0.450–4.500)

## 2017-01-09 LAB — FOLATE: Folate: >20 ng/mL (ref >3.0)

## 2017-01-09 LAB — IRON: Iron: 103 ug/dL (ref 27–139)

## 2017-01-11 ENCOUNTER — Telehealth: Payer: Self-pay | Admitting: Family Medicine

## 2017-01-11 ENCOUNTER — Other Ambulatory Visit (HOSPITAL_COMMUNITY): Payer: Self-pay | Admitting: Nurse Practitioner

## 2017-01-13 NOTE — Telephone Encounter (Signed)
Pt aware.

## 2017-04-03 ENCOUNTER — Ambulatory Visit (INDEPENDENT_AMBULATORY_CARE_PROVIDER_SITE_OTHER): Payer: Medicare Other | Admitting: Family Medicine

## 2017-04-03 ENCOUNTER — Encounter: Payer: Self-pay | Admitting: Family Medicine

## 2017-04-03 VITALS — BP 119/83 | HR 112 | Temp 97.4°F | Ht 63.56 in | Wt 133.4 lb

## 2017-04-03 DIAGNOSIS — J3489 Other specified disorders of nose and nasal sinuses: Secondary | ICD-10-CM

## 2017-04-03 MED ORDER — MUPIROCIN 2 % EX OINT: 1.0000 "application " | TOPICAL_OINTMENT | Freq: Two times a day (BID) | CUTANEOUS | 0 refills | Status: DC

## 2017-04-03 NOTE — Progress Notes (Signed)
   HPI  Patient presents today here with a painful sore in the nose.  Patient states that she's had symptoms since May off and on. Patient states that she has a crusty painful lesion that will form   in the anterior part of her left nostril, resolved, and then moved to the right nostril and vice versa.  She denies any significant drainage. She has used Polysporin a few times.  She denies any fever, chills, sweats.  PMH: Smoking status noted ROS: Per HPI  Objective: BP 119/83   Pulse (!) 112   Temp (!) 97.4 F (36.3 C) (Oral)   Ht 5' 3.56" (1.614 m)   Wt 133 lb 6.4 oz (60.5 kg)   BMI 23.22 kg/m  Gen: NAD, alert, cooperative with exam HEENT: NCAT,  difficult to visualize but there is a small erythematous lesion of the left anterior nares CV: Irregularly irregular, rate had slowed to less than 100 on my exam  Resp: CTABL, no wheezes, non-labored Ext: No edema, warm Neuro: Alert and oriented, No gross deficits  Assessment and plan:  # Sore in nose Treat with mupirocin ointment 5-7 days If no improvement consider ENT referral   Meds ordered this encounter  Medications  . mupirocin ointment (BACTROBAN) 2 %    Sig: Place 1 application into the nose 2 (two) times daily. For 5 -7 days    Dispense:  22 g    Refill:  [place], MD [place] 04/03/2017, 11:09 AM

## 2017-04-03 NOTE — Patient Instructions (Signed)
Great to see you!  Come back or cal with any questions  Apply mupirocin ointment twice daily for 5-7 days.

## 2017-04-09 ENCOUNTER — Encounter: Payer: Self-pay | Admitting: *Deleted

## 2017-05-07 ENCOUNTER — Ambulatory Visit (INDEPENDENT_AMBULATORY_CARE_PROVIDER_SITE_OTHER): Payer: Medicare Other | Admitting: Family Medicine

## 2017-05-07 ENCOUNTER — Encounter: Payer: Self-pay | Admitting: Family Medicine

## 2017-05-07 VITALS — BP 123/82 | HR 66 | Temp 97.2°F | Ht 63.66 in | Wt 133.0 lb

## 2017-05-07 DIAGNOSIS — Z23 Encounter for immunization: Secondary | ICD-10-CM

## 2017-05-07 DIAGNOSIS — E7800 Pure hypercholesterolemia, unspecified: Secondary | ICD-10-CM

## 2017-05-07 DIAGNOSIS — I480 Paroxysmal atrial fibrillation: Secondary | ICD-10-CM

## 2017-05-07 DIAGNOSIS — E079 Disorder of thyroid, unspecified: Secondary | ICD-10-CM | POA: Diagnosis not present

## 2017-05-07 DIAGNOSIS — R252 Cramp and spasm: Secondary | ICD-10-CM | POA: Diagnosis not present

## 2017-05-07 DIAGNOSIS — E559 Vitamin D deficiency, unspecified: Secondary | ICD-10-CM

## 2017-05-07 NOTE — Patient Instructions (Addendum)
Medicare Annual Wellness Visit  [facility] and the medical providers at [facility] strive to bring you the best medical care.  In doing so we not only want to address your current medical conditions and concerns but also to detect new conditions early and prevent illness, disease and health-related problems.    Medicare offers a yearly Wellness Visit which allows our clinical staff to assess your need for preventative services including immunizations, lifestyle education, counseling to decrease risk of preventable diseases and screening for fall risk and other medical concerns.    This visit is provided free of charge (no copay) for all Medicare recipients. The clinical pharmacists at [facility] have begun to conduct these Wellness Visits which will also include a thorough review of all your medications.    As you primary medical provider recommend that you make an appointment for your Annual Wellness Visit if you have not done so already this year.  You may set up this appointment before you leave today or you may call back ([phone]) and schedule an appointment.  Please make sure when you call that you mention that you are scheduling your Annual Wellness Visit with the clinical pharmacist so that the appointment may be made for the proper length of time.     Continue current medications. Continue good therapeutic lifestyle changes which include good diet and exercise. Fall precautions discussed with patient. If an FOBT was given today- please return it to our front desk. If you are over 19 years old - you may need Prevnar 73 or the adult Pneumonia vaccine.  **Flu shots are available--- please call and schedule a FLU-CLINIC appointment**  After your visit with [place] today you will receive a survey in the mail or online from [employer] regarding your care with [place]. Please take a moment to fill this out. Your feedback is very  important to [place] as you can help [place] better understand your patient needs as well as improve your experience and satisfaction. WE CARE ABOUT YOU!!!  Please make sure that you're drinking plenty of water and plenty of fluids Especially if on trips wear good support hose We will check lab work today including a magnesium level and a B12 level and a CBC and serum iron level. Try wearing some support hose and putting these on the first thing with arising in the morning to see if this helps with the fluid retention and therefore not requiring you to get up at nighttime as much to go to the bathroom Consider getting a Lifeline as this would provide more security and a safety net.

## 2017-05-07 NOTE — Progress Notes (Signed)
Subjective:    Patient ID: [name], female    DOB: [birth year], 83 y.o.   MRN: [id number]  HPI Pt here for follow up and management of chronic medical problems which includes a fib and hyperlipidemia. She is taking medication reguarlly.The patient has problems with her legs especially with sitting for long periods of time and having to get up and move more . The patient sees a cardiologist, once yearly. She does not wear any support hose. The patient denies any chest pain or shortness of breath. She has no trouble with swallowing heartburn indigestion nausea vomiting diarrhea blood in the stool or black tarry bowel movements. She is passing her water well but does have a lot of nocturia.   Patient Active Problem List   Diagnosis Date Noted  . AV malformation of gastrointestinal tract   . Acute bronchitis 04/06/2015  . Primary osteoarthritis involving multiple joints 02/07/2015  . High risk medication use 06/21/2013  . Dizziness   . Restless leg syndrome 03/01/2013  . ATRIAL FIBRILLATION 11/03/2008  . Hyperlipidemia 10/24/2008  . ANEMIA, IRON DEFICIENCY, MICROCYTIC 10/24/2008  . SINUS BRADYCARDIA 10/24/2008  . CVA 10/24/2008  . OSTEOPENIA 10/24/2008   Outpatient Encounter Prescriptions as of 05/07/2017  Medication Sig  . acetaminophen (TYLENOL) 500 MG tablet Take 1-2 tablets (500-1,000 mg total) by mouth 3 (three) times daily as needed (arthritis pain).  Marland Kitchen atorvastatin (LIPITOR) 10 MG tablet TAKE 1/2 TABLET BY MOUTH ONCE A DAY OR AS INSTRUCTED BY PHYSICIAN  . Calcium Carbonate-Vit D-Min 1200-1000 MG-UNIT CHEW Chew 1 tablet by mouth daily.  . chlorhexidine (PERIDEX) 0.12 % solution Mouthwash - Use as directed  . Cholecalciferol (VITAMIN D3) 1000 UNITS CAPS Take 1 capsule by mouth daily.  Marland Kitchen diltiazem (CARDIZEM CD) 180 MG 24 hr capsule TAKE 1 CAPSULE (180 MG TOTAL) BY MOUTH DAILY.  . folic acid (FOLVITE) 1 MG tablet Take 1 mg by mouth daily.  Marland Kitchen ipratropium (ATROVENT) 0.06  % nasal spray PLACE 1 SPRAY INTO EACH NOSTRIL TWICE A DAY AS NEEDED  . Magnesium 500 MG TABS Take 1 tablet by mouth daily.  . Multiple Vitamin (MULTIVITAMIN) capsule Take 1 capsule by mouth daily.    . mupirocin ointment (BACTROBAN) 2 % Place 1 application into the nose 2 (two) times daily. For 5 -7 days  . XARELTO 15 MG TABS tablet TAKE 1 TABLET (15 MG TOTAL) BY MOUTH DAILY.   No facility-administered encounter medications on file as of 05/07/2017.       Review of Systems  Constitutional: Negative.   HENT: Negative.   Eyes: Negative.   Respiratory: Negative.   Cardiovascular: Negative.   Gastrointestinal: Negative.   Endocrine: Negative.   Genitourinary: Negative.   Musculoskeletal: Negative.        Restless leg?   Skin: Negative.   Allergic/Immunologic: Negative.   Neurological: Negative.   Hematological: Negative.   Psychiatric/Behavioral: Negative.        Objective:   Physical Exam  Constitutional: She is oriented to person, place, and time. She appears well-developed and well-nourished. No distress.  The patient is pleasant and alert and appears much younger than her stated age  HENT:  Head: Normocephalic and atraumatic.  Right Ear: External ear normal.  Left Ear: External ear normal.  Nose: Nose normal.  Mouth/Throat: Oropharynx is clear and moist. No oropharyngeal exudate.  She is wearing bilateral hearing aids  Eyes: Pupils are equal, round, and reactive to light. Conjunctivae and EOM are normal. Right eye exhibits  no discharge. Left eye exhibits no discharge. No scleral icterus.  She gets her eyes checked regularly at my eye doctor  Neck: Normal range of motion. Neck supple. No JVD present. No thyromegaly present.  No bruits thyromegaly or anterior cervical adenopathy  Cardiovascular: Normal rate, normal heart sounds and intact distal pulses.   No murmur heard. The heart is irregular irregular at 96/m  Pulmonary/Chest: Effort normal and breath sounds normal. No  respiratory distress. She has no wheezes. She has no rales.  Clear anteriorly and posteriorly  Abdominal: Soft. Bowel sounds are normal. She exhibits no mass. There is no tenderness. There is no rebound and no guarding.  No abdominal tenderness masses or organ enlargement or bruits  Musculoskeletal: Normal range of motion. She exhibits no edema.  No edema noted on the exam today  Lymphadenopathy:    She has no cervical adenopathy.  Neurological: She is alert and oriented to person, place, and time. She has normal reflexes. No cranial nerve deficit.  Skin: Skin is warm and dry. No rash noted.  Psychiatric: She has a normal mood and affect. Her behavior is normal. Judgment and thought content normal.  Nursing note and vitals reviewed.   BP 123/82 (BP Location: Left Arm)   Pulse 66   Temp (!) 97.2 F (36.2 C) (Oral)   Ht 5' 3.66" (1.617 m)   Wt 133 lb (60.3 kg)   BMI 23.07 kg/m        Assessment & Plan:  1. Pure hypercholesterolemia -Continue aggressive therapeutic lifestyle changes along with atorvastatin - CBC with Differential/Platelet - BMP8+EGFR - Lipid panel - Hepatic function panel  2. Paroxysmal atrial fibrillation (HCC) -Continue blood thinner as doing - CBC with Differential/Platelet  3. Thyroid disease - CBC with Differential/Platelet  4. Vitamin D deficiency -Continue current treatment - CBC with Differential/Platelet - VITAMIN D 25 Hydroxy (Vit-D Deficiency, Fractures)  5. Leg cramps -These cramps are worse after she's been sitting for an extended period of time - Thyroid Panel With TSH - Vitamin B12 - Magnesium - Ferritin - Folate  Patient Instructions                       Medicare Annual Wellness Visit  [facility] and the medical providers at [facility] strive to bring you the best medical care.  In doing so we not only want to address your current medical conditions and concerns but also to detect new conditions early and  prevent illness, disease and health-related problems.    Medicare offers a yearly Wellness Visit which allows our clinical staff to assess your need for preventative services including immunizations, lifestyle education, counseling to decrease risk of preventable diseases and screening for fall risk and other medical concerns.    This visit is provided free of charge (no copay) for all Medicare recipients. The clinical pharmacists at [facility] have begun to conduct these Wellness Visits which will also include a thorough review of all your medications.    As you primary medical provider recommend that you make an appointment for your Annual Wellness Visit if you have not done so already this year.  You may set up this appointment before you leave today or you may call back ([phone]) and schedule an appointment.  Please make sure when you call that you mention that you are scheduling your Annual Wellness Visit with the clinical pharmacist so that the appointment may be made for the proper length  of time.     Continue current medications. Continue good therapeutic lifestyle changes which include good diet and exercise. Fall precautions discussed with patient. If an FOBT was given today- please return it to our front desk. If you are over 3 years old - you may need Prevnar 62 or the adult Pneumonia vaccine.  **Flu shots are available--- please call and schedule a FLU-CLINIC appointment**  After your visit with [place] today you will receive a survey in the mail or online from [employer] regarding your care with [place]. Please take a moment to fill this out. Your feedback is very important to [place] as you can help [place] better understand your patient needs as well as improve your experience and satisfaction. WE CARE ABOUT YOU!!!  Please make sure that you're drinking plenty of water and plenty of fluids Especially if on trips wear good support hose We will check lab work today including  a magnesium level and a B12 level and a CBC and serum iron level. Try wearing some support hose and putting these on the first thing with arising in the morning to see if this helps with the fluid retention and therefore not requiring you to get up at nighttime as much to go to the bathroom Consider getting a Lifeline as this would provide more security and a safety net.   Arrie Senate MD

## 2017-05-08 LAB — HEPATIC FUNCTION PANEL
ALK PHOS: 75 IU/L (ref 39–117)
ALT: 11 IU/L (ref 0–32)
AST: 22 IU/L (ref 0–40)
Albumin: 4.2 g/dL (ref 3.5–4.7)
BILIRUBIN, DIRECT: 0.1 mg/dL (ref 0.00–0.40)
Bilirubin Total: 0.3 mg/dL (ref 0.0–1.2)
Total Protein: 6.9 g/dL (ref 6.0–8.5)

## 2017-05-08 LAB — BMP8+EGFR
BUN / CREAT RATIO: 16 (ref 12–28)
BUN: 16 mg/dL (ref 8–27)
CO2: 26 mmol/L (ref 20–29)
CREATININE: 0.97 mg/dL (ref 0.57–1.00)
Calcium: 9.4 mg/dL (ref 8.7–10.3)
Chloride: 104 mmol/L (ref 96–106)
GFR, EST AFRICAN AMERICAN: 62 mL/min/{1.73_m2} (ref >59)
GFR, EST NON AFRICAN AMERICAN: 54 mL/min/{1.73_m2} — AB (ref >59)
Glucose: 89 mg/dL (ref 65–99)
Potassium: 4.5 mmol/L (ref 3.5–5.2)
SODIUM: 142 mmol/L (ref 134–144)

## 2017-05-08 LAB — CBC WITH DIFFERENTIAL/PLATELET
BASOS ABS: 0 10*3/uL (ref 0.0–0.2)
BASOS: 1 %
EOS (ABSOLUTE): 0.1 10*3/uL (ref 0.0–0.4)
Eos: 1 %
HEMOGLOBIN: 12.6 g/dL (ref 11.1–15.9)
Hematocrit: 37.5 % (ref 34.0–46.6)
IMMATURE GRANS (ABS): 0 10*3/uL (ref 0.0–0.1)
Immature Granulocytes: 0 %
LYMPHS ABS: 2.9 10*3/uL (ref 0.7–3.1)
LYMPHS: 38 %
MCH: 31 pg (ref 26.6–33.0)
MCHC: 33.6 g/dL (ref 31.5–35.7)
MCV: 92 fL (ref 79–97)
MONOCYTES: 9 %
Monocytes Absolute: 0.7 10*3/uL (ref 0.1–0.9)
NEUTROS ABS: 4 10*3/uL (ref 1.4–7.0)
Neutrophils: 51 %
Platelets: 273 10*3/uL (ref 150–379)
RBC: 4.06 x10E6/uL (ref 3.77–5.28)
RDW: 13.7 % (ref 12.3–15.4)
WBC: 7.7 10*3/uL (ref 3.4–10.8)

## 2017-05-08 LAB — LIPID PANEL
CHOL/HDL RATIO: 2.3 ratio (ref 0.0–4.4)
Cholesterol, Total: 152 mg/dL (ref 100–199)
HDL: 66 mg/dL (ref >39)
LDL Calculated: 59 mg/dL (ref 0–99)
Triglycerides: 133 mg/dL (ref 0–149)
VLDL Cholesterol Cal: 27 mg/dL (ref 5–40)

## 2017-05-08 LAB — FOLATE: Folate: >20 ng/mL (ref >3.0)

## 2017-05-08 LAB — THYROID PANEL WITH TSH
FREE THYROXINE INDEX: 2.3 (ref 1.2–4.9)
T3 UPTAKE RATIO: 27 % (ref 24–39)
T4 TOTAL: 8.4 ug/dL (ref 4.5–12.0)
TSH: 2.05 u[IU]/mL (ref 0.450–4.500)

## 2017-05-08 LAB — MAGNESIUM: MAGNESIUM: 2.3 mg/dL (ref 1.6–2.3)

## 2017-05-08 LAB — VITAMIN D 25 HYDROXY (VIT D DEFICIENCY, FRACTURES): Vit D, 25-Hydroxy: 47.5 ng/mL (ref 30.0–100.0)

## 2017-05-08 LAB — VITAMIN B12: Vitamin B-12: 799 pg/mL (ref 232–1245)

## 2017-05-08 LAB — FERRITIN: FERRITIN: 89 ng/mL (ref 15–150)

## 2017-05-13 ENCOUNTER — Other Ambulatory Visit: Payer: Self-pay | Admitting: Family Medicine

## 2017-05-13 ENCOUNTER — Ambulatory Visit: Payer: Self-pay | Admitting: Pharmacist

## 2017-05-14 ENCOUNTER — Ambulatory Visit: Payer: Self-pay | Admitting: *Deleted

## 2017-06-04 ENCOUNTER — Ambulatory Visit (INDEPENDENT_AMBULATORY_CARE_PROVIDER_SITE_OTHER): Payer: Medicare Other | Admitting: Family Medicine

## 2017-06-04 ENCOUNTER — Ambulatory Visit (INDEPENDENT_AMBULATORY_CARE_PROVIDER_SITE_OTHER): Payer: Medicare Other

## 2017-06-04 ENCOUNTER — Encounter: Payer: Self-pay | Admitting: Family Medicine

## 2017-06-04 VITALS — BP 113/67 | HR 81 | Temp 97.0°F | Ht 63.66 in | Wt 136.0 lb

## 2017-06-04 DIAGNOSIS — M25511 Pain in right shoulder: Secondary | ICD-10-CM | POA: Diagnosis not present

## 2017-06-04 MED ORDER — BETAMETHASONE SOD PHOS & ACET 6 (3-3) MG/ML IJ SUSP
6.0000 mg | Freq: Once | INTRAMUSCULAR | Status: AC
  Administered 2017-06-04: 6 mg via INTRAMUSCULAR

## 2017-06-04 MED ORDER — CELECOXIB 200 MG PO CAPS: 200.0000 mg | ORAL_CAPSULE | Freq: Every day | ORAL | 1 refills | Status: DC

## 2017-06-04 NOTE — Progress Notes (Signed)
Chief Complaint  Patient presents with  . Shoulder Pain    pt here today c/o right shoulder pain since Monday which she noticed after her exercise class that she routinely does 3-4 times a week. Pt states she usually has twinges but she is having trouble lifting her arm above her head now.    HPI  Patient presents today for Onset of pain in the right shoulder after her workout 3 days ago. She does a lot of aerobics and uses 4 pound weights for lifting as well. She works out 3-4 times a week and usually does quite well but noted some soreness the time before last and then Monday increased discomfort that has persisted for 3 days. It is painful to abduct the right upper extremity.  PMH: Smoking status noted ROS: Per HPI  Objective: BP 113/67   Pulse 81   Temp (!) 97 F (36.1 C) (Oral)   Ht 5' 3.66" (1.617 m)   Wt 136 lb (61.7 kg)   BMI 23.59 kg/m  Gen: NAD, alert, cooperative with exam HEENT: NCAT, EOMI, PERRL CV: RRR, good S1/S2, no murmur Resp: CTABL, no wheezes, non-labored Ext: No edema, warm. There is tenderness at the anterior biceps tendon near its origin. There is decreased range of motion for resisted flexion and supination at the elbow as well as for rotation externally. For the right upper extremity Neuro: Alert and oriented, No gross deficits right upper extremity neurovascularly intact X-ray showed no acute abnormality Assessment and plan:  1. Acute pain of right shoulder     Meds ordered this encounter  Medications  . betamethasone acetate-betamethasone sodium phosphate (CELESTONE) injection 6 mg  . celecoxib (CELEBREX) 200 MG capsule    Sig: Take 1 capsule (200 mg total) by mouth daily.    Dispense:  30 capsule    Refill:  1    Orders Placed This Encounter  Procedures  . DG Shoulder Right    Standing Status:   Future    Number of Occurrences:   1    Standing Expiration Date:   08/04/2018    Order Specific Question:   Reason for Exam (SYMPTOM  OR DIAGNOSIS  REQUIRED)    Answer:   Shoulder pain    Order Specific Question:   Preferred imaging location?    Answer:   Internal  . Ambulatory referral to Physical Therapy    Referral Priority:   Routine    Referral Type:   Physical Medicine    Referral Reason:   Specialty Services Required    Requested Specialty:   Physical Therapy    Number of Visits Requested:   1    Follow up as needed.  Claretta Fraise, MD

## 2017-06-11 ENCOUNTER — Other Ambulatory Visit (HOSPITAL_COMMUNITY): Payer: Self-pay | Admitting: Cardiology

## 2017-06-20 ENCOUNTER — Ambulatory Visit: Payer: Medicare Other | Admitting: Family Medicine

## 2017-09-17 ENCOUNTER — Ambulatory Visit: Payer: Medicare Other | Admitting: Family Medicine

## 2017-09-22 ENCOUNTER — Other Ambulatory Visit: Payer: Self-pay | Admitting: Family Medicine

## 2017-09-22 DIAGNOSIS — Z1231 Encounter for screening mammogram for malignant neoplasm of breast: Secondary | ICD-10-CM

## 2017-09-25 ENCOUNTER — Encounter: Payer: Self-pay | Admitting: Family Medicine

## 2017-09-25 ENCOUNTER — Ambulatory Visit: Payer: Medicare Other | Admitting: Family Medicine

## 2017-09-25 VITALS — BP 118/76 | HR 74 | Temp 97.3°F | Ht 63.66 in | Wt 138.0 lb

## 2017-09-25 DIAGNOSIS — N3941 Urge incontinence: Secondary | ICD-10-CM

## 2017-09-25 DIAGNOSIS — E559 Vitamin D deficiency, unspecified: Secondary | ICD-10-CM

## 2017-09-25 DIAGNOSIS — E7800 Pure hypercholesterolemia, unspecified: Secondary | ICD-10-CM

## 2017-09-25 DIAGNOSIS — I480 Paroxysmal atrial fibrillation: Secondary | ICD-10-CM

## 2017-09-25 DIAGNOSIS — H02826 Cysts of left eye, unspecified eyelid: Secondary | ICD-10-CM | POA: Diagnosis not present

## 2017-09-25 DIAGNOSIS — E079 Disorder of thyroid, unspecified: Secondary | ICD-10-CM

## 2017-09-25 DIAGNOSIS — M65311 Trigger thumb, right thumb: Secondary | ICD-10-CM

## 2017-09-25 LAB — URINALYSIS, COMPLETE
Bilirubin, UA: NEGATIVE
GLUCOSE, UA: NEGATIVE
KETONES UA: NEGATIVE
Leukocytes, UA: NEGATIVE
NITRITE UA: NEGATIVE
PROTEIN UA: NEGATIVE
RBC, UA: NEGATIVE
Specific Gravity, UA: 1.01 (ref 1.005–1.030)
UUROB: 0.2 mg/dL (ref 0.2–1.0)
pH, UA: 7.5 (ref 5.0–7.5)

## 2017-09-25 LAB — MICROSCOPIC EXAMINATION
Bacteria, UA: NONE SEEN
RBC, UA: NONE SEEN /hpf (ref 0–?)
Renal Epithel, UA: NONE SEEN /hpf

## 2017-09-25 NOTE — Addendum Note (Signed)
Addended by: Zannie Cove on: 09/25/2017 03:19 PM   Modules accepted: Orders

## 2017-09-25 NOTE — Progress Notes (Signed)
Subjective:    Patient ID: [name], female    DOB: [birth year], 84 y.o.   MRN: [id number]  HPI Pt here for follow up and management of chronic medical problems which includes hyperlipidemia and a fib. She is taking medication regularly.  Patient today complains of a trigger finger in the thumb of the right hand.  She also is concerned about a lesion in the left eye and urinary frequency and urgency.  She will be given an FOBT to return and will return to the office for fasting lab work.  We will get a urinalysis today.  The patient is pleasant and alert.  She denies any chest pain or shortness of breath.  She denies any trouble with her intestinal tract or change in bowel habits.  She is passing her water well other than just frequency.  She complains of the lesion on her left lower eyelid that is been there for couple weeks.  It appears to be like a cystic lesion on the lid itself.  It is not causing any problems currently.  She also has this aggravating problem with a trigger finger and the ring finger of the right hand and the thumb of the right hand.  She also complains of urinary frequency and urgency and we will check a urinalysis.    Patient Active Problem List   Diagnosis Date Noted  . AV malformation of gastrointestinal tract   . Acute bronchitis 04/06/2015  . Primary osteoarthritis involving multiple joints 02/07/2015  . High risk medication use 06/21/2013  . Dizziness   . Restless leg syndrome 03/01/2013  . ATRIAL FIBRILLATION 11/03/2008  . Hyperlipidemia 10/24/2008  . ANEMIA, IRON DEFICIENCY, MICROCYTIC 10/24/2008  . SINUS BRADYCARDIA 10/24/2008  . CVA 10/24/2008  . OSTEOPENIA 10/24/2008   Outpatient Encounter Medications as of 09/25/2017  Medication Sig  . acetaminophen (TYLENOL) 500 MG tablet Take 1-2 tablets (500-1,000 mg total) by mouth 3 (three) times daily as needed (arthritis pain).  Marland Kitchen atorvastatin (LIPITOR) 10 MG tablet TAKE 1/2 TABLET BY MOUTH ONCE A  DAY OR AS INSTRUCTED BY PHYSICIAN  . Calcium Carbonate-Vit D-Min 1200-1000 MG-UNIT CHEW Chew 1 tablet by mouth daily.  . chlorhexidine (PERIDEX) 0.12 % solution Mouthwash - Use as directed  . Cholecalciferol (VITAMIN D3) 1000 UNITS CAPS Take 1 capsule by mouth daily.  Marland Kitchen diltiazem (CARDIZEM CD) 180 MG 24 hr capsule TAKE 1 CAPSULE (180 MG TOTAL) BY MOUTH DAILY.  . folic acid (FOLVITE) 1 MG tablet Take 1 mg by mouth daily.  Marland Kitchen ipratropium (ATROVENT) 0.06 % nasal spray PLACE 1 SPRAY INTO EACH NOSTRIL TWICE A DAY AS NEEDED  . Magnesium 500 MG TABS Take 1 tablet by mouth daily.  . Multiple Vitamin (MULTIVITAMIN) capsule Take 1 capsule by mouth daily.    Alveda Reasons 15 MG TABS tablet TAKE 1 TABLET BY MOUTH EVERY DAY  . [DISCONTINUED] celecoxib (CELEBREX) 200 MG capsule Take 1 capsule (200 mg total) by mouth daily.  . [DISCONTINUED] mupirocin ointment (BACTROBAN) 2 % Place 1 application into the nose 2 (two) times daily. For 5 -7 days   No facility-administered encounter medications on file as of 09/25/2017.      Review of Systems  Constitutional: Negative.   HENT: Negative.   Eyes: Negative.        Lesion of left eye  Respiratory: Negative.   Cardiovascular: Negative.   Gastrointestinal: Negative.   Endocrine: Negative.   Genitourinary: Positive for urgency.  Musculoskeletal: Positive for arthralgias (right hand - "  trigger finger/ thumb").  Skin: Negative.   Allergic/Immunologic: Negative.   Neurological: Negative.   Hematological: Negative.   Psychiatric/Behavioral: Negative.        Objective:   Physical Exam  Constitutional: She is oriented to person, place, and time. She appears well-developed and well-nourished. No distress.  The patient is pleasant and alert.  HENT:  Head: Normocephalic and atraumatic.  Right Ear: External ear normal.  Left Ear: External ear normal.  Nose: Nose normal.  Mouth/Throat: Oropharynx is clear and moist. No oropharyngeal exudate.  Eyes: Conjunctivae  and EOM are normal. Pupils are equal, round, and reactive to light. Right eye exhibits no discharge. Left eye exhibits no discharge. No scleral icterus.  There is a cyst on the lid margin lower lid of the left eye.  There is no redness and the eye itself appears normal.  Neck: Normal range of motion. Neck supple. No thyromegaly present.  No bruits thyromegaly or anterior cervical adenopathy  Cardiovascular: Normal rate, normal heart sounds and intact distal pulses.  No murmur heard. The heart is only slightly irregular at 84/min  Pulmonary/Chest: Effort normal and breath sounds normal. No respiratory distress. She has no wheezes. She has no rales.  Clear anteriorly and posteriorly  Abdominal: Soft. Bowel sounds are normal. She exhibits no mass. There is no tenderness. There is no rebound and no guarding.  No abdominal tenderness masses bruits or organ enlargement.  Musculoskeletal: Normal range of motion. She exhibits tenderness. She exhibits no edema.  The patient has tenderness in the palm of the right hand that is responsible for her trigger finger on the ring finger.  She also has tenderness in the palm of the right thumb.  Lymphadenopathy:    She has no cervical adenopathy.  Neurological: She is alert and oriented to person, place, and time. She has normal reflexes. No cranial nerve deficit.  Skin: Skin is warm and dry. No rash noted.  Psychiatric: She has a normal mood and affect. Her behavior is normal. Judgment and thought content normal.  Nursing note and vitals reviewed.   BP 118/76 (BP Location: Right Arm)   Pulse 74   Temp (!) 97.3 F (36.3 C) (Oral)   Ht 5' 3.66" (1.617 m)   Wt 138 lb (62.6 kg)   BMI 23.94 kg/m        Assessment & Plan:  1. Pure hypercholesterolemia -Continue with aggressive therapeutic lifestyle changes pending results of lab work - BMP8+EGFR - CBC with Differential/Platelet - Lipid panel - Hepatic function panel  2. Paroxysmal atrial  fibrillation (HCC) -Continue regular follow-up with cardiology and stay on current medication - CBC with Differential/Platelet  3. Thyroid disease - CBC with Differential/Platelet - Thyroid Panel With TSH  4. Vitamin D deficiency -10 you current treatment pending results of lab work - CBC with Differential/Platelet - VITAMIN D 25 Hydroxy (Vit-D Deficiency, Fractures)  5. Urge incontinence of urine - Urinalysis, Complete - Urine Culture  6. Eyelid cyst, left -Refer to ophthalmologist for further evaluation and treatment  Patient Instructions                       Medicare Annual Wellness Visit  [facility] and the medical providers at [facility] strive to bring you the best medical care.  In doing so we not only want to address your current medical conditions and concerns but also to detect new conditions early and prevent illness, disease and health-related problems.  Medicare offers a yearly Wellness Visit which allows our clinical staff to assess your need for preventative services including immunizations, lifestyle education, counseling to decrease risk of preventable diseases and screening for fall risk and other medical concerns.    This visit is provided free of charge (no copay) for all Medicare recipients. The clinical pharmacists at [facility] have begun to conduct these Wellness Visits which will also include a thorough review of all your medications.    As you primary medical provider recommend that you make an appointment for your Annual Wellness Visit if you have not done so already this year.  You may set up this appointment before you leave today or you may call back ([phone]) and schedule an appointment.  Please make sure when you call that you mention that you are scheduling your Annual Wellness Visit with the clinical pharmacist so that the appointment may be made for the proper length of time.     Continue current  medications. Continue good therapeutic lifestyle changes which include good diet and exercise. Fall precautions discussed with patient. If an FOBT was given today- please return it to our front desk. If you are over 94 years old - you may need Prevnar 85 or the adult Pneumonia vaccine.  **Flu shots are available--- please call and schedule a FLU-CLINIC appointment**  After your visit with [place] today you will receive a survey in the mail or online from [employer] regarding your care with [place]. Please take a moment to fill this out. Your feedback is very important to [place] as you can help [place] better understand your patient needs as well as improve your experience and satisfaction. WE CARE ABOUT YOU!!!   We will do a referral to the [facility] orthopedics to see the extremity specialist and also to the ophthalmologist, Dr. Carolynn Sayers to see her regarding the cystic lesion on her left lower lid.  We will try to arrange these appointments that she sees the ophthalmologist in the morning and the orthopedic specialist early in the afternoon so that she is not on the road at the busy times of the day. She should continue to drink plenty of fluids and stay well-hydrated we will call with results of the lab work as soon as this becomes available. She should continue to follow-up with a cardiologist as planned.    Arrie Senate MD   7. Trigger finger of right thumb -Refer to orthopedist as planned  Arrie Senate MD

## 2017-09-25 NOTE — Patient Instructions (Addendum)
Medicare Annual Wellness Visit  [facility] and the medical providers at [facility] strive to bring you the best medical care.  In doing so we not only want to address your current medical conditions and concerns but also to detect new conditions early and prevent illness, disease and health-related problems.    Medicare offers a yearly Wellness Visit which allows our clinical staff to assess your need for preventative services including immunizations, lifestyle education, counseling to decrease risk of preventable diseases and screening for fall risk and other medical concerns.    This visit is provided free of charge (no copay) for all Medicare recipients. The clinical pharmacists at [facility] have begun to conduct these Wellness Visits which will also include a thorough review of all your medications.    As you primary medical provider recommend that you make an appointment for your Annual Wellness Visit if you have not done so already this year.  You may set up this appointment before you leave today or you may call back ([phone]) and schedule an appointment.  Please make sure when you call that you mention that you are scheduling your Annual Wellness Visit with the clinical pharmacist so that the appointment may be made for the proper length of time.     Continue current medications. Continue good therapeutic lifestyle changes which include good diet and exercise. Fall precautions discussed with patient. If an FOBT was given today- please return it to our front desk. If you are over 82 years old - you may need Prevnar 2 or the adult Pneumonia vaccine.  **Flu shots are available--- please call and schedule a FLU-CLINIC appointment**  After your visit with [place] today you will receive a survey in the mail or online from [employer] regarding your care with [place]. Please take a moment to fill this out. Your feedback is very  important to [place] as you can help [place] better understand your patient needs as well as improve your experience and satisfaction. WE CARE ABOUT YOU!!!   We will do a referral to the [facility] orthopedics to see the extremity specialist and also to the ophthalmologist, Dr. Carolynn Sayers to see her regarding the cystic lesion on her left lower lid.  We will try to arrange these appointments that she sees the ophthalmologist in the morning and the orthopedic specialist early in the afternoon so that she is not on the road at the busy times of the day. She should continue to drink plenty of fluids and stay well-hydrated we will call with results of the lab work as soon as this becomes available. She should continue to follow-up with a cardiologist as planned.

## 2017-09-26 ENCOUNTER — Other Ambulatory Visit: Payer: Medicare Other

## 2017-09-26 DIAGNOSIS — N3941 Urge incontinence: Secondary | ICD-10-CM

## 2017-09-26 DIAGNOSIS — E559 Vitamin D deficiency, unspecified: Secondary | ICD-10-CM

## 2017-09-26 DIAGNOSIS — M65311 Trigger thumb, right thumb: Secondary | ICD-10-CM

## 2017-09-26 DIAGNOSIS — H02826 Cysts of left eye, unspecified eyelid: Secondary | ICD-10-CM

## 2017-09-26 DIAGNOSIS — E7800 Pure hypercholesterolemia, unspecified: Secondary | ICD-10-CM

## 2017-09-26 DIAGNOSIS — I480 Paroxysmal atrial fibrillation: Secondary | ICD-10-CM

## 2017-09-26 DIAGNOSIS — E079 Disorder of thyroid, unspecified: Secondary | ICD-10-CM

## 2017-09-27 LAB — CBC WITH DIFFERENTIAL/PLATELET
Basophils Absolute: 0.1 10*3/uL (ref 0.0–0.2)
Basos: 1 %
EOS (ABSOLUTE): 0.1 10*3/uL (ref 0.0–0.4)
Eos: 1 %
Hematocrit: 40.4 % (ref 34.0–46.6)
Hemoglobin: 13.1 g/dL (ref 11.1–15.9)
Immature Grans (Abs): 0 10*3/uL (ref 0.0–0.1)
Immature Granulocytes: 0 %
Lymphocytes Absolute: 2.4 10*3/uL (ref 0.7–3.1)
Lymphs: 34 %
MCH: 31 pg (ref 26.6–33.0)
MCHC: 32.4 g/dL (ref 31.5–35.7)
MCV: 96 fL (ref 79–97)
Monocytes Absolute: 0.7 10*3/uL (ref 0.1–0.9)
Monocytes: 9 %
Neutrophils Absolute: 3.9 10*3/uL (ref 1.4–7.0)
Neutrophils: 55 %
Platelets: 294 10*3/uL (ref 150–379)
RBC: 4.23 x10E6/uL (ref 3.77–5.28)
RDW: 13.7 % (ref 12.3–15.4)
WBC: 7.1 10*3/uL (ref 3.4–10.8)

## 2017-09-27 LAB — LIPID PANEL
CHOL/HDL RATIO: 2.3 ratio (ref 0.0–4.4)
Cholesterol, Total: 145 mg/dL (ref 100–199)
HDL: 62 mg/dL (ref >39)
LDL Calculated: 66 mg/dL (ref 0–99)
TRIGLYCERIDES: 87 mg/dL (ref 0–149)
VLDL CHOLESTEROL CAL: 17 mg/dL (ref 5–40)

## 2017-09-27 LAB — HEPATIC FUNCTION PANEL
ALBUMIN: 4.2 g/dL (ref 3.5–4.7)
ALT: 14 IU/L (ref 0–32)
AST: 21 IU/L (ref 0–40)
Alkaline Phosphatase: 75 IU/L (ref 39–117)
BILIRUBIN TOTAL: 0.6 mg/dL (ref 0.0–1.2)
Bilirubin, Direct: 0.17 mg/dL (ref 0.00–0.40)
Total Protein: 7 g/dL (ref 6.0–8.5)

## 2017-09-27 LAB — THYROID PANEL WITH TSH
Free Thyroxine Index: 2 (ref 1.2–4.9)
T3 Uptake Ratio: 27 % (ref 24–39)
T4, Total: 7.5 ug/dL (ref 4.5–12.0)
TSH: 3.38 u[IU]/mL (ref 0.450–4.500)

## 2017-09-27 LAB — BMP8+EGFR
BUN/Creatinine Ratio: 8 — ABNORMAL LOW (ref 12–28)
BUN: 8 mg/dL (ref 8–27)
CALCIUM: 9.8 mg/dL (ref 8.7–10.3)
CO2: 22 mmol/L (ref 20–29)
CREATININE: 0.99 mg/dL (ref 0.57–1.00)
Chloride: 104 mmol/L (ref 96–106)
GFR calc Af Amer: 61 mL/min/{1.73_m2} (ref >59)
GFR, EST NON AFRICAN AMERICAN: 52 mL/min/{1.73_m2} — AB (ref >59)
Glucose: 91 mg/dL (ref 65–99)
Potassium: 4.6 mmol/L (ref 3.5–5.2)
SODIUM: 142 mmol/L (ref 134–144)

## 2017-09-27 LAB — URINE CULTURE

## 2017-09-27 LAB — VITAMIN D 25 HYDROXY (VIT D DEFICIENCY, FRACTURES): Vit D, 25-Hydroxy: 42.5 ng/mL (ref 30.0–100.0)

## 2017-09-29 ENCOUNTER — Other Ambulatory Visit: Payer: Self-pay | Admitting: *Deleted

## 2017-09-29 MED ORDER — AMOXICILLIN 500 MG PO CAPS: 500.0000 mg | ORAL_CAPSULE | Freq: Three times a day (TID) | ORAL | 0 refills | Status: DC

## 2017-10-02 ENCOUNTER — Other Ambulatory Visit: Payer: Self-pay | Admitting: *Deleted

## 2017-10-02 MED ORDER — IPRATROPIUM BROMIDE 0.06 % NA SOLN: NASAL | 0 refills | Status: DC

## 2017-10-15 ENCOUNTER — Telehealth: Payer: Self-pay | Admitting: Family Medicine

## 2017-10-16 NOTE — Telephone Encounter (Signed)
Patient aware of lab results.

## 2017-10-20 ENCOUNTER — Other Ambulatory Visit (HOSPITAL_COMMUNITY): Payer: Self-pay | Admitting: *Deleted

## 2017-10-20 MED ORDER — DILTIAZEM HCL ER COATED BEADS 180 MG PO CP24: ORAL_CAPSULE | ORAL | 3 refills | Status: DC

## 2017-10-23 ENCOUNTER — Ambulatory Visit
Admission: RE | Admit: 2017-10-23 | Discharge: 2017-10-23 | Disposition: A | Discharge status: Home / Self Care | Payer: Medicare Other | Source: Ambulatory Visit | Attending: Family Medicine | Admitting: Family Medicine

## 2017-10-23 DIAGNOSIS — Z1231 Encounter for screening mammogram for malignant neoplasm of breast: Secondary | ICD-10-CM

## 2017-11-04 ENCOUNTER — Ambulatory Visit (HOSPITAL_COMMUNITY): Payer: Medicare Other | Admitting: Nurse Practitioner

## 2017-11-10 ENCOUNTER — Other Ambulatory Visit (HOSPITAL_COMMUNITY): Payer: Self-pay | Admitting: Cardiology

## 2017-11-11 ENCOUNTER — Encounter (HOSPITAL_COMMUNITY): Payer: Self-pay | Admitting: Nurse Practitioner

## 2017-11-11 ENCOUNTER — Ambulatory Visit (HOSPITAL_COMMUNITY)
Admission: RE | Admit: 2017-11-11 | Discharge: 2017-11-11 | Disposition: A | Discharge status: Home / Self Care | Payer: Medicare Other | Source: Ambulatory Visit | Attending: Nurse Practitioner | Admitting: Nurse Practitioner

## 2017-11-11 VITALS — BP 128/76 | HR 88 | Ht 63.5 in | Wt 133.8 lb

## 2017-11-11 DIAGNOSIS — M8580 Other specified disorders of bone density and structure, unspecified site: Secondary | ICD-10-CM | POA: Diagnosis not present

## 2017-11-11 DIAGNOSIS — Z9889 Other specified postprocedural states: Secondary | ICD-10-CM | POA: Diagnosis not present

## 2017-11-11 DIAGNOSIS — I481 Persistent atrial fibrillation: Secondary | ICD-10-CM | POA: Insufficient documentation

## 2017-11-11 DIAGNOSIS — E785 Hyperlipidemia, unspecified: Secondary | ICD-10-CM | POA: Diagnosis not present

## 2017-11-11 DIAGNOSIS — I4821 Permanent atrial fibrillation: Secondary | ICD-10-CM

## 2017-11-11 DIAGNOSIS — Z7901 Long term (current) use of anticoagulants: Secondary | ICD-10-CM | POA: Diagnosis not present

## 2017-11-11 DIAGNOSIS — D509 Iron deficiency anemia, unspecified: Secondary | ICD-10-CM | POA: Insufficient documentation

## 2017-11-11 DIAGNOSIS — I482 Chronic atrial fibrillation: Secondary | ICD-10-CM | POA: Diagnosis not present

## 2017-11-11 DIAGNOSIS — Z79899 Other long term (current) drug therapy: Secondary | ICD-10-CM | POA: Insufficient documentation

## 2017-11-11 DIAGNOSIS — G2581 Restless legs syndrome: Secondary | ICD-10-CM | POA: Insufficient documentation

## 2017-11-11 DIAGNOSIS — Z823 Family history of stroke: Secondary | ICD-10-CM | POA: Diagnosis not present

## 2017-11-11 DIAGNOSIS — Z87891 Personal history of nicotine dependence: Secondary | ICD-10-CM | POA: Diagnosis not present

## 2017-11-11 DIAGNOSIS — Z801 Family history of malignant neoplasm of trachea, bronchus and lung: Secondary | ICD-10-CM | POA: Diagnosis not present

## 2017-11-11 NOTE — Progress Notes (Signed)
Patient ID: AYANA IMHOF, female   DOB: [birth year], 84 y.o.   MRN: [id number]     Primary Care Physician: Chipper Herb, MD Referring Physician: Dr. Rayann Heman Cardiologist: Dr. Rene Kocher is a 84 y.o. female with a h/o longstanding permanent afib . She is in the afib clinic for f/u and reports that she is doing the activities that she enjoys and does not notice any adverse effects from afib.. She currently is line dancing  1x a week and exercises 4x a week.  She is on xarelto 15 mg without bleeding issues.  Today, she denies symptoms of palpitations, chest pain, shortness of breath, orthopnea, PND, lower extremity edema, dizziness, presyncope, syncope, or neurologic sequela. The patient is tolerating medications without difficulties and is otherwise without complaint today.   Past Medical History:  Diagnosis Date  . Arthritis   . AV malformation of gastrointestinal tract    in cecum she denies history of GIB  . Breast mass, right   . Cataract   . CVA (cerebral vascular accident) ([place]) 2007  . Dizziness    denies this as an ongoing problem  . Hyperlipidemia   . Microcytic anemia   . Osteopenia   . Persistent atrial fibrillation ([facility])    s/p PVI 04/2010  CHADS2vasc at least 5  . RLS (restless legs syndrome)   . Sinus bradycardia    Past Surgical History:  Procedure Laterality Date  . afib ablation  04/2010   PVI with CTI ablation performed by [facility]  . BREAST EXCISIONAL BIOPSY Right    benign cyst  . breast mass resected    . CARDIOVERSION     for afib  . CATARACT EXTRACTION W/PHACO  11/11/2011   Procedure: CATARACT EXTRACTION PHACO AND INTRAOCULAR LENS PLACEMENT (IOC);  Surgeon: Tonny Branch, MD;  Location: [facility];  Service: Ophthalmology;  Laterality: Left;  CDE:12.62  . CATARACT EXTRACTION W/PHACO  11/25/2011   Procedure: CATARACT EXTRACTION PHACO AND INTRAOCULAR LENS PLACEMENT (IOC);  Surgeon: Tonny Branch, MD;  Location: [facility];  Service:  Ophthalmology;  Laterality: Right;  CDE 16.14  . dental inplants      Current Outpatient Medications  Medication Sig Dispense Refill  . acetaminophen (TYLENOL) 500 MG tablet Take 1-2 tablets (500-1,000 mg total) by mouth 3 (three) times daily as needed (arthritis pain). 30 tablet 0  . atorvastatin (LIPITOR) 10 MG tablet TAKE 1/2 TABLET BY MOUTH ONCE A DAY OR AS INSTRUCTED BY PHYSICIAN 45 tablet 1  . Calcium Carbonate-Vit D-Min 1200-1000 MG-UNIT CHEW Chew 1 tablet by mouth daily.    . chlorhexidine (PERIDEX) 0.12 % solution Mouthwash - Use as directed    . Cholecalciferol (VITAMIN D3) 1000 UNITS CAPS Take 1 capsule by mouth daily.    Marland Kitchen diltiazem (CARDIZEM CD) 180 MG 24 hr capsule TAKE 1 CAPSULE (180 MG TOTAL) BY MOUTH DAILY. 90 capsule 3  . folic acid (FOLVITE) 1 MG tablet Take 1 mg by mouth daily.    Marland Kitchen ipratropium (ATROVENT) 0.06 % nasal spray PLACE 1 SPRAY INTO EACH NOSTRIL TWICE A DAY AS NEEDED 45 mL 0  . Magnesium 500 MG TABS Take 1 tablet by mouth daily.    . Multiple Vitamin (MULTIVITAMIN) capsule Take 1 capsule by mouth daily.      Alveda Reasons 15 MG TABS tablet TAKE 1 TABLET BY MOUTH EVERY DAY 90 tablet 1   No current facility-administered medications for this encounter.     No Known Allergies  Social  History   Socioeconomic History  . Marital status: Widowed    Spouse name: Not on file  . Number of children: 0  . Years of education: college  . Highest education level: Not on file  Occupational History  . Occupation: retired  Scientific laboratory technician  . Financial resource strain: Not on file  . Food insecurity:    Worry: Not on file    Inability: Not on file  . Transportation needs:    Medical: Not on file    Non-medical: Not on file  Tobacco Use  . Smoking status: Former Smoker    Packs/day: 1.00    Years: 30.00    Pack years: 30.00    Types: Cigarettes    Last attempt to quit: 08/05/1978    Years since quitting: 39.2  . Smokeless tobacco: Never Used  . Tobacco comment: quit  in 1980s  Substance and Sexual Activity  . Alcohol use: No  . Drug use: No  . Sexual activity: Never  Lifestyle  . Physical activity:    Days per week: Not on file    Minutes per session: Not on file  . Stress: Not on file  Relationships  . Social connections:    Talks on phone: Not on file    Gets together: Not on file    Attends religious service: Not on file    Active member of club or organization: Not on file    Attends meetings of clubs or organizations: Not on file    Relationship status: Not on file  . Intimate partner violence:    Fear of current or ex partner: Not on file    Emotionally abused: Not on file    Physically abused: Not on file    Forced sexual activity: Not on file  Other Topics Concern  . Not on file  Social History Narrative   Patient is retired and lives at home alone. Patient has business college education.    Right handed.   Caffeine- some times - soda     Family History  Problem Relation Age of Onset  . Stroke Mother 32  . Hip fracture Mother   . Lung cancer Father 11  . Anesthesia problems Neg Hx   . Hypotension Neg Hx   . Malignant hyperthermia Neg Hx   . Pseudochol deficiency Neg Hx   . Breast cancer Neg Hx     ROS- All systems are reviewed and negative except as per the HPI above  Physical Exam: Vitals:   11/11/17 1121  BP: 128/76  Pulse: 88  Weight: 133 lb 12.8 oz (60.7 kg)  Height: 5' 3.5" (1.613 m)    GEN- The patient is well appearing, alert and oriented x 3 today.   Head- normocephalic, atraumatic Eyes-  Sclera clear, conjunctiva pink Ears- hearing intact Oropharynx- clear Neck- supple, no JVP Lymph- no cervical lymphadenopathy Lungs- Clear to ausculation bilaterally, normal work of breathing Heart- Regular rate and rhythm, no murmurs, rubs or gallops, PMI not laterally displaced GI- soft, NT, ND, + BS Extremities- no clubbing, cyanosis, or edema MS- no significant deformity or atrophy Skin- no rash or  lesion Psych- euthymic mood, full affect Neuro- strength and sensation are intact  EKG- afib at 88 bpm, qrs int 70 ms, qtc 421 ms Epic records reviewed Recent labs drawn by PCP 09/2017  reviewed and wnl, creatinine stable  Assessment and Plan: 1. Asymptomatic  afib Longstanding permanent, well rate controlled  Continue diltiazem 180 mg daily Continue xarelto  15 mg daily with a crcl cal at [employer] on exercise program and encouraged to stay active  F/u in afib clinic in one year  Butch Penny C. Carroll, [facility] [address] [place], [place] [address] [phone]

## 2018-01-28 ENCOUNTER — Ambulatory Visit: Payer: Medicare Other | Admitting: Family Medicine

## 2018-01-28 ENCOUNTER — Encounter: Payer: Self-pay | Admitting: Family Medicine

## 2018-01-28 VITALS — BP 150/85 | HR 106 | Temp 97.1°F | Ht 63.5 in | Wt 135.0 lb

## 2018-01-28 DIAGNOSIS — E079 Disorder of thyroid, unspecified: Secondary | ICD-10-CM

## 2018-01-28 DIAGNOSIS — E559 Vitamin D deficiency, unspecified: Secondary | ICD-10-CM

## 2018-01-28 DIAGNOSIS — E7800 Pure hypercholesterolemia, unspecified: Secondary | ICD-10-CM | POA: Diagnosis not present

## 2018-01-28 DIAGNOSIS — I480 Paroxysmal atrial fibrillation: Secondary | ICD-10-CM

## 2018-01-28 NOTE — Patient Instructions (Addendum)
Medicare Annual Wellness Visit  [facility] and the medical providers at [facility] strive to bring you the best medical care.  In doing so we not only want to address your current medical conditions and concerns but also to detect new conditions early and prevent illness, disease and health-related problems.    Medicare offers a yearly Wellness Visit which allows our clinical staff to assess your need for preventative services including immunizations, lifestyle education, counseling to decrease risk of preventable diseases and screening for fall risk and other medical concerns.    This visit is provided free of charge (no copay) for all Medicare recipients. The clinical pharmacists at [facility] have begun to conduct these Wellness Visits which will also include a thorough review of all your medications.    As you primary medical provider recommend that you make an appointment for your Annual Wellness Visit if you have not done so already this year.  You may set up this appointment before you leave today or you may call back ([phone]) and schedule an appointment.  Please make sure when you call that you mention that you are scheduling your Annual Wellness Visit with the clinical pharmacist so that the appointment may be made for the proper length of time.    Continue current medications. Continue good therapeutic lifestyle changes which include good diet and exercise. Fall precautions discussed with patient. If an FOBT was given today- please return it to our front desk. If you are over 46 years old - you may need Prevnar 26 or the adult Pneumonia vaccine.  **Flu shots are available--- please call and schedule a FLU-CLINIC appointment**  After your visit with [place] today you will receive a survey in the mail or online from [employer] regarding your care with [place]. Please take a moment to fill this out. Your feedback is very  important to [place] as you can help [place] better understand your patient needs as well as improve your experience and satisfaction. WE CARE ABOUT YOU!!!   Check blood pressure readings at home and bring these readings by for review in a couple of weeks Call Dr. Rosezella [place] office and confirm with him whether you need to go to the A. fib clinic or not Continue to watch sodium intake and drink plenty of water and stay active physically Do not put yourself at risk for falling

## 2018-01-28 NOTE — Progress Notes (Signed)
Subjective:    Patient ID: [name], female    DOB: 08/30/1930, 84 y.o.   MRN: [id number]  HPI  Pt here for follow up and management of chronic medical problems which includes hyperlipidemia and a fib. She is taking medication regularly.  The patient is doing well overall with no specific complaints.  She was given an FOBT to return and will get lab work today.  Her blood pressure was slightly elevated at 149/89.  Her pulse rate was 106.  The patient continues to do well.  Recently she was in an adult senior dance competition.  She has a history of atrial fibrillation and sick sinus syndrome and a stroke from which she has no residual.  Patient is doing well overall and is staying active.  She sees Dr. Percival Spanish yearly and will find out from him if she needs to go to the A. fib clinic are not in addition to seeing him.  She denies any chest pain pressure tightness or shortness of breath.  She denies any trouble with swallowing heartburn indigestion nausea vomiting diarrhea blood in the stool or black tarry bowel movements or change in bowel habits.  She is passing her water without problems.  She does describe some discomfort at nighttime briefly when she rolls on her left side but that is it.  I know for fact that she is involved actively in dancing and recently saw her performing with a senior group in [place].     Patient Active Problem List   Diagnosis Date Noted  . AV malformation of gastrointestinal tract   . Acute bronchitis 04/06/2015  . Primary osteoarthritis involving multiple joints 02/07/2015  . High risk medication use 06/21/2013  . Dizziness   . Restless leg syndrome 03/01/2013  . ATRIAL FIBRILLATION 11/03/2008  . Hyperlipidemia 10/24/2008  . ANEMIA, IRON DEFICIENCY, MICROCYTIC 10/24/2008  . SINUS BRADYCARDIA 10/24/2008  . CVA 10/24/2008  . OSTEOPENIA 10/24/2008   Outpatient Encounter Medications as of 01/28/2018  Medication Sig  . acetaminophen (TYLENOL) 500  MG tablet Take 1-2 tablets (500-1,000 mg total) by mouth 3 (three) times daily as needed (arthritis pain).  Marland Kitchen atorvastatin (LIPITOR) 10 MG tablet TAKE 1/2 TABLET BY MOUTH ONCE A DAY OR AS INSTRUCTED BY PHYSICIAN  . Calcium Carbonate-Vit D-Min 1200-1000 MG-UNIT CHEW Chew 1 tablet by mouth daily.  . chlorhexidine (PERIDEX) 0.12 % solution Mouthwash - Use as directed  . Cholecalciferol (VITAMIN D3) 1000 UNITS CAPS Take 1 capsule by mouth daily.  Marland Kitchen diltiazem (CARDIZEM CD) 180 MG 24 hr capsule TAKE 1 CAPSULE (180 MG TOTAL) BY MOUTH DAILY.  . folic acid (FOLVITE) 1 MG tablet Take 1 mg by mouth daily.  Marland Kitchen ipratropium (ATROVENT) 0.06 % nasal spray PLACE 1 SPRAY INTO EACH NOSTRIL TWICE A DAY AS NEEDED  . Magnesium 500 MG TABS Take 1 tablet by mouth daily.  . Multiple Vitamin (MULTIVITAMIN) capsule Take 1 capsule by mouth daily.    Alveda Reasons 15 MG TABS tablet TAKE 1 TABLET BY MOUTH EVERY DAY   No facility-administered encounter medications on file as of 01/28/2018.      Review of Systems  Constitutional: Negative.   HENT: Negative.   Eyes: Negative.   Respiratory: Negative.   Cardiovascular: Negative.   Gastrointestinal: Negative.   Endocrine: Negative.   Genitourinary: Negative.   Musculoskeletal: Negative.   Skin: Negative.   Allergic/Immunologic: Negative.   Neurological: Negative.   Hematological: Negative.   Psychiatric/Behavioral: Negative.  Objective:   Physical Exam  Constitutional: She is oriented to person, place, and time. She appears well-developed and well-nourished. No distress.  The patient is pleasant and relaxed and looks much younger than her stated age of 69 years.  HENT:  Head: Normocephalic and atraumatic.  Right Ear: External ear normal.  Left Ear: External ear normal.  Nose: Nose normal.  Mouth/Throat: Oropharynx is clear and moist. No oropharyngeal exudate.  Eyes: Pupils are equal, round, and reactive to light. Conjunctivae and EOM are normal. Right eye  exhibits no discharge. Left eye exhibits no discharge. No scleral icterus.  Neck: Normal range of motion. Neck supple. No thyromegaly present.  No bruits thyromegaly or anterior cervical adenopathy  Cardiovascular: Normal rate, normal heart sounds and intact distal pulses.  No murmur heard. Heart is irregular irregular at 84 to 96/min  Pulmonary/Chest: Effort normal and breath sounds normal. She has no wheezes. She has no rales.  Clear anteriorly and posteriorly  Abdominal: Soft. Bowel sounds are normal. She exhibits no mass. There is no tenderness.  No abdominal tenderness masses organ enlargement or bruits  Musculoskeletal: Normal range of motion. She exhibits no edema.  Lymphadenopathy:    She has no cervical adenopathy.  Neurological: She is alert and oriented to person, place, and time. She has normal reflexes. No cranial nerve deficit.  Skin: Skin is warm and dry. No rash noted.  Psychiatric: She has a normal mood and affect. Her behavior is normal. Judgment and thought content normal.  Patient is alert and pleasant with normal mood and behavior  Nursing note and vitals reviewed.  BP (!) 149/89 (BP Location: Left Arm)   Pulse (!) 106   Temp (!) 97.1 F (36.2 C) (Oral)   Ht 5' 3.5" (1.613 m)   Wt 135 lb (61.2 kg)   BMI 23.54 kg/m         Assessment & Plan:  1. Thyroid disease -10 you with current treatment pending results of lab work - CBC with Differential/Platelet - Thyroid Panel With TSH  2. Pure hypercholesterolemia -Patient is statin intolerant.  She will continue with as aggressive therapeutic lifestyle changes as possible - BMP8+EGFR - CBC with Differential/Platelet - Lipid panel - Hepatic function panel  3. Paroxysmal atrial fibrillation (HCC) -Remains in atrial fibrillation.  She will follow-up with cardiology as planned and will check with the cardiologist to see if she needs to go to the atrial fib clinic. - BMP8+EGFR - CBC with  Differential/Platelet  4. Vitamin D deficiency -Continue with current treatment and with aggressive therapeutic lifestyle changes as far as physical activity. - CBC with Differential/Platelet - VITAMIN D 25 Hydroxy (Vit-D Deficiency, Fractures)  Patient Instructions                       Medicare Annual Wellness Visit  [facility] and the medical providers at [facility] strive to bring you the best medical care.  In doing so we not only want to address your current medical conditions and concerns but also to detect new conditions early and prevent illness, disease and health-related problems.    Medicare offers a yearly Wellness Visit which allows our clinical staff to assess your need for preventative services including immunizations, lifestyle education, counseling to decrease risk of preventable diseases and screening for fall risk and other medical concerns.    This visit is provided free of charge (no copay) for all Medicare recipients. The clinical pharmacists at [facility] have begun to conduct these Wellness Visits which will also include a thorough review of all your medications.    As you primary medical provider recommend that you make an appointment for your Annual Wellness Visit if you have not done so already this year.  You may set up this appointment before you leave today or you may call back ([phone]) and schedule an appointment.  Please make sure when you call that you mention that you are scheduling your Annual Wellness Visit with the clinical pharmacist so that the appointment may be made for the proper length of time.    Continue current medications. Continue good therapeutic lifestyle changes which include good diet and exercise. Fall precautions discussed with patient. If an FOBT was given today- please return it to our front desk. If you are over 13 years old - you may need Prevnar 15 or the adult Pneumonia  vaccine.  **Flu shots are available--- please call and schedule a FLU-CLINIC appointment**  After your visit with [place] today you will receive a survey in the mail or online from [employer] regarding your care with [place]. Please take a moment to fill this out. Your feedback is very important to [place] as you can help [place] better understand your patient needs as well as improve your experience and satisfaction. WE CARE ABOUT YOU!!!   Check blood pressure readings at home and bring these readings by for review in a couple of weeks Call Dr. Rosezella [place] office and confirm with him whether you need to go to the A. fib clinic or not Continue to watch sodium intake and drink plenty of water and stay active physically Do not put yourself at risk for falling  Arrie Senate MD

## 2018-01-29 ENCOUNTER — Other Ambulatory Visit: Payer: Medicare Other

## 2018-01-29 DIAGNOSIS — Z1211 Encounter for screening for malignant neoplasm of colon: Secondary | ICD-10-CM

## 2018-01-29 LAB — CBC WITH DIFFERENTIAL/PLATELET
BASOS ABS: 0.1 10*3/uL (ref 0.0–0.2)
BASOS: 1 %
EOS (ABSOLUTE): 0.1 10*3/uL (ref 0.0–0.4)
Eos: 1 %
Hematocrit: 37.8 % (ref 34.0–46.6)
Hemoglobin: 12.9 g/dL (ref 11.1–15.9)
Immature Grans (Abs): 0 10*3/uL (ref 0.0–0.1)
Immature Granulocytes: 0 %
LYMPHS: 34 %
Lymphocytes Absolute: 2.6 10*3/uL (ref 0.7–3.1)
MCH: 30.9 pg (ref 26.6–33.0)
MCHC: 34.1 g/dL (ref 31.5–35.7)
MCV: 91 fL (ref 79–97)
MONOS ABS: 0.9 10*3/uL (ref 0.1–0.9)
Monocytes: 12 %
NEUTROS ABS: 4 10*3/uL (ref 1.4–7.0)
Neutrophils: 52 %
PLATELETS: 266 10*3/uL (ref 150–450)
RBC: 4.17 x10E6/uL (ref 3.77–5.28)
RDW: 12.6 % (ref 12.3–15.4)
WBC: 7.6 10*3/uL (ref 3.4–10.8)

## 2018-01-29 LAB — BMP8+EGFR
BUN/Creatinine Ratio: 14 (ref 12–28)
BUN: 15 mg/dL (ref 8–27)
CALCIUM: 9.6 mg/dL (ref 8.7–10.3)
CO2: 24 mmol/L (ref 20–29)
Chloride: 103 mmol/L (ref 96–106)
Creatinine, Ser: 1.08 mg/dL — ABNORMAL HIGH (ref 0.57–1.00)
GFR calc non Af Amer: 47 mL/min/{1.73_m2} — ABNORMAL LOW (ref >59)
GFR, EST AFRICAN AMERICAN: 54 mL/min/{1.73_m2} — AB (ref >59)
Glucose: 85 mg/dL (ref 65–99)
POTASSIUM: 4.7 mmol/L (ref 3.5–5.2)
SODIUM: 140 mmol/L (ref 134–144)

## 2018-01-29 LAB — LIPID PANEL
CHOL/HDL RATIO: 2.5 ratio (ref 0.0–4.4)
CHOLESTEROL TOTAL: 151 mg/dL (ref 100–199)
HDL: 61 mg/dL (ref >39)
LDL Calculated: 76 mg/dL (ref 0–99)
Triglycerides: 72 mg/dL (ref 0–149)
VLDL CHOLESTEROL CAL: 14 mg/dL (ref 5–40)

## 2018-01-29 LAB — HEPATIC FUNCTION PANEL
ALT: 7 IU/L (ref 0–32)
AST: 17 IU/L (ref 0–40)
Albumin: 4 g/dL (ref 3.5–4.7)
Alkaline Phosphatase: 72 IU/L (ref 39–117)
BILIRUBIN, DIRECT: 0.17 mg/dL (ref 0.00–0.40)
Bilirubin Total: 0.5 mg/dL (ref 0.0–1.2)
TOTAL PROTEIN: 6.6 g/dL (ref 6.0–8.5)

## 2018-01-29 LAB — THYROID PANEL WITH TSH
FREE THYROXINE INDEX: 2.2 (ref 1.2–4.9)
T3 Uptake Ratio: 28 % (ref 24–39)
T4, Total: 7.8 ug/dL (ref 4.5–12.0)
TSH: 2.42 u[IU]/mL (ref 0.450–4.500)

## 2018-01-29 LAB — VITAMIN D 25 HYDROXY (VIT D DEFICIENCY, FRACTURES): VIT D 25 HYDROXY: 52.7 ng/mL (ref 30.0–100.0)

## 2018-01-31 LAB — FECAL OCCULT BLOOD, IMMUNOCHEMICAL: FECAL OCCULT BLD: NEGATIVE

## 2018-02-04 ENCOUNTER — Ambulatory Visit: Payer: Medicare Other | Admitting: *Deleted

## 2018-02-04 VITALS — BP 120/76 | HR 100

## 2018-02-04 DIAGNOSIS — Z0130 Encounter for examination of blood pressure without abnormal findings: Secondary | ICD-10-CM

## 2018-02-04 NOTE — Patient Instructions (Signed)
How to Take Your Blood Pressure You can take your blood pressure at home with a machine. You may need to check your blood pressure at home:  To check if you have high blood pressure (hypertension).  To check your blood pressure over time.  To make sure your blood pressure medicine is working.  Supplies needed: You will need a blood pressure machine, or monitor. You can buy one at a drugstore or online. When choosing one:  Choose one with an arm cuff.  Choose one that wraps around your upper arm. Only one finger should fit between your arm and the cuff.  Do not choose one that measures your blood pressure from your wrist or finger.  Your doctor can suggest a monitor. How to prepare Avoid these things for 30 minutes before checking your blood pressure:  Drinking caffeine.  Drinking alcohol.  Eating.  Smoking.  Exercising.  Five minutes before checking your blood pressure:  Pee.  Sit in a dining chair. Avoid sitting in a soft couch or armchair.  Be quiet. Do not talk.  How to take your blood pressure Follow the instructions that came with your machine. If you have a digital blood pressure monitor, these may be the instructions: 1. Sit up straight. 2. Place your feet on the floor. Do not cross your ankles or legs. 3. Rest your left arm at the level of your heart. You may rest it on a table, desk, or chair. 4. Pull up your shirt sleeve. 5. Wrap the blood pressure cuff around the upper part of your left arm. The cuff should be 1 inch (2.5 cm) above your elbow. It is best to wrap the cuff around bare skin. 6. Fit the cuff snugly around your arm. You should be able to place only one finger between the cuff and your arm. 7. Put the cord inside the groove of your elbow. 8. Press the power button. 9. Sit quietly while the cuff fills with air and loses air. 10. Write down the numbers on the screen. 11. Wait 2-3 minutes and then repeat steps 1-10.  What do the numbers  mean? Two numbers make up your blood pressure. The first number is called systolic pressure. The second is called diastolic pressure. An example of a blood pressure reading is "120 over 80" (or 120/80). If you are an adult and do not have a medical condition, use this guide to find out if your blood pressure is normal: Normal  First number: below 120.  Second number: below 80. Elevated  First number: 120-129.  Second number: below 80. Hypertension stage 1  First number: 130-139.  Second number: 80-89. Hypertension stage 2  First number: 140 or above.  Second number: 90 or above. Your blood pressure is above normal even if only the top or bottom number is above normal. Follow these instructions at home:  Check your blood pressure as often as your doctor tells you to.  Take your monitor to your next doctor's appointment. Your doctor will: ? Make sure you are using it correctly. ? Make sure it is working right.  Make sure you understand what your blood pressure numbers should be.  Tell your doctor if your medicines are causing side effects. Contact a doctor if:  Your blood pressure keeps being high. Get help right away if:  Your first blood pressure number is higher than 180.  Your second blood pressure number is higher than 120. This information is not intended to replace advice given   to you by your health care provider. Make sure you discuss any questions you have with your health care provider. Document Released: 07/04/2008 Document Revised: 06/19/2016 Document Reviewed: 12/29/2015 Elsevier Interactive Patient Education  2018 Elsevier Inc.  

## 2018-02-04 NOTE — Progress Notes (Signed)
Pt is here for BP check  BP 120 76 P 100

## 2018-03-28 ENCOUNTER — Encounter: Payer: Self-pay | Admitting: Nurse Practitioner

## 2018-03-28 ENCOUNTER — Ambulatory Visit: Payer: Medicare Other | Admitting: Nurse Practitioner

## 2018-03-28 VITALS — BP 127/76 | HR 83 | Temp 97.3°F | Ht 63.0 in | Wt 135.0 lb

## 2018-03-28 DIAGNOSIS — L247 Irritant contact dermatitis due to plants, except food: Secondary | ICD-10-CM

## 2018-03-28 MED ORDER — PREDNISONE 20 MG PO TABS: ORAL_TABLET | ORAL | 0 refills | Status: DC

## 2018-03-28 NOTE — Patient Instructions (Signed)
Poison Ivy Dermatitis Poison ivy dermatitis is redness and soreness (inflammation) of the skin. It is caused by a chemical that is found on the leaves of the poison ivy plant. You may also have itching, a rash, and blisters. Symptoms often clear up in 1-2 weeks. You may get this condition by touching a poison ivy plant. You can also get it by touching something that has the chemical on it. This may include animals or objects that have come in contact with the plant. Follow these instructions at home: General instructions  Take or apply over-the-counter and prescription medicines only as told by your doctor.  If you touch poison ivy, wash your skin with soap and cold water right away.  Use hydrocortisone creams or calamine lotion as needed to help with itching.  Take oatmeal baths as needed. Use colloidal oatmeal. You can get this at a pharmacy or grocery store. Follow the instructions on the package.  Do not scratch or rub your skin.  While you have the rash, wash your clothes right after you wear them. Prevention  Know what poison ivy looks like so you can avoid it. This plant has three leaves with flowering branches on a single stem. The leaves are glossy. They have uneven edges that come to a point at the front.  If you have touched poison ivy, wash with soap and water right away. Be sure to wash under your fingernails.  When hiking or camping, wear long pants, a long-sleeved shirt, tall socks, and hiking boots. You can also use a lotion on your skin that helps to prevent contact with the chemical on the plant.  If you think that your clothes or outdoor gear came in contact with poison ivy, rinse them off with a garden hose before you bring them inside your house. Contact a doctor if:  You have open sores in the rash area.  You have more redness, swelling, or pain in the affected area.  You have redness that spreads beyond the rash area.  You have fluid, blood, or pus coming from  the affected area.  You have a fever.  You have a rash over a large area of your body.  You have a rash on your eyes, mouth, or genitals.  Your rash does not get better after a few days. Get help right away if:  Your face swells or your eyes swell shut.  You have trouble breathing.  You have trouble swallowing. This information is not intended to replace advice given to you by your health care provider. Make sure you discuss any questions you have with your health care provider. Document Released: 08/24/2010 Document Revised: 12/28/2015 Document Reviewed: 12/28/2014 Elsevier Interactive Patient Education  2018 Elsevier Inc.  

## 2018-03-28 NOTE — Progress Notes (Signed)
   Subjective:    Patient ID: [name], female    DOB: Apr 10, 1931, 84 y.o.   MRN: [id number]   Chief Complaint: Rash   HPI Patient in c/o of itching around right eye and on right hand. She says she has spots that pop up in varying places and they itch. Has ben going on for about 1 week.    Review of Systems  Constitutional: Negative.   HENT: Negative.   Respiratory: Negative.   Cardiovascular: Negative.   Genitourinary: Negative.   Musculoskeletal: Negative.   Skin:       itching  Neurological: Negative.   Psychiatric/Behavioral: Negative.   All other systems reviewed and are negative.      Objective:   Physical Exam  Constitutional: She appears well-developed and well-nourished. No distress.  Cardiovascular: Normal rate.  Pulmonary/Chest: Effort normal.  Skin: Skin is warm.  Erythematous vesicular lesions in patch on right temple area and several places on right thumb and index foinger   BP 127/76   Pulse 83   Temp (!) 97.3 F (36.3 C) (Oral)   Ht 5\' 3"  (1.6 m)   Wt 135 lb (61.2 kg)   BMI 23.91 kg/m         Assessment & Plan:  Kemiyah Tarazon [name] in today with chief complaint of Rash   1. Irritant contact dermatitis due to plants, except food Calamine lotion Zyrtec otc  Avoid scratching Cool compresses RTO if not improving   Mary-Margaret Hassell Done, FNP  - predniSONE (DELTASONE) 20 MG tablet; 2 po at sametime daily for 5 days  Dispense: 10 tablet; Refill: 0

## 2018-04-08 ENCOUNTER — Encounter: Payer: Medicare Other | Admitting: *Deleted

## 2018-04-09 ENCOUNTER — Encounter: Payer: Medicare Other | Admitting: *Deleted

## 2018-04-13 ENCOUNTER — Ambulatory Visit (INDEPENDENT_AMBULATORY_CARE_PROVIDER_SITE_OTHER): Payer: Medicare Other | Admitting: *Deleted

## 2018-04-13 VITALS — BP 105/71 | HR 86 | Temp 97.4°F | Ht 63.0 in | Wt 133.4 lb

## 2018-04-13 DIAGNOSIS — Z0000 Encounter for general adult medical examination without abnormal findings: Secondary | ICD-10-CM | POA: Diagnosis not present

## 2018-04-13 NOTE — Progress Notes (Addendum)
Subjective:   [name] is a 84 y.o. female who presents for Medicare Annual (Subsequent) preventive examination.   [name] is retired from [employer] of 35 years.  She is widowed and lives at home alone but has friends that she visits almost daily.  [name] enjoys dancing, traveling and senior games.  She currently exercises for an hour 4 times weekly at the recreation department doing strengthening exercises and dancing.  [name] only eats approximately two meals daily (Breakfast and dinner).  She attends church every Sunday and participates in senior games (sports).  She does not have any tripping hazards at home.  [name] has had no hospitalizations, ER visits or surgeries in the past year.  Overall she feels that health is the same as it was a year ago.    Objective:     Vitals: BP 105/71   Pulse 86   Temp (!) 97.4 F (36.3 C) (Oral)   Ht 5\' 3"  (1.6 m)   Wt 133 lb 6.4 oz (60.5 kg)   BMI 23.63 kg/m   Body mass index is 23.63 kg/m.  Advanced Directives 05/07/2016 02/17/2013 11/06/2011  Does Patient Have a Medical Advance Directive? Yes Patient has advance directive, copy not in chart Patient has advance directive, copy not in chart  Type of Advance Directive [place];Living will - [place];Living will  Does patient want to make changes to medical advance directive? No - Patient declined - -  Copy of [place] in Chart? No - copy requested - Copy requested from family  Copy Requested Tobacco Social History   Tobacco Use  Smoking Status Former Smoker  . Packs/day: 1.00  . Years: 30.00  . Pack years: 30.00  . Types: Cigarettes  . Last attempt to quit: 08/05/1978  . Years since quitting: 39.7  Smokeless Tobacco Never Used  Tobacco Comment   quit in 1980s    No tobacco use at this time Counseling given: Not Answered Comment: quit in 1980s   Clinical Intake:  Pre-visit  preparation completed: Yes  Pain : No/denies pain     BMI - recorded: 23.9 Nutritional Status: BMI of 19-24  Normal Nutritional Risks: None Diabetes: No  How often do you need to have someone help you when you read instructions, pamphlets, or other written materials from your doctor or pharmacy?: 1 - Never What is the last grade level you completed in school?: Some College  Interpreter Needed?: No  Information entered by :: Truett Mainland, LPN  Past Medical History:  Diagnosis Date  . Arthritis   . AV malformation of gastrointestinal tract    in cecum she denies history of GIB  . Breast mass, right   . Cataract   . CVA (cerebral vascular accident) ([place]) 2007  . Dizziness    denies this as an ongoing problem  . Hyperlipidemia   . Microcytic anemia   . Osteopenia   . Persistent atrial fibrillation ([place])    s/p PVI 04/2010  CHADS2vasc at least 5  . RLS (restless legs syndrome)   . Sinus bradycardia    Past Surgical History:  Procedure Laterality Date  . afib ablation  04/2010   PVI with CTI ablation performed by JA  . BREAST EXCISIONAL BIOPSY Right    benign cyst  . breast mass resected    . CARDIOVERSION     for afib  . CATARACT EXTRACTION W/PHACO  11/11/2011   Procedure:  CATARACT EXTRACTION PHACO AND INTRAOCULAR LENS PLACEMENT (IOC);  Surgeon: Tonny Branch, MD;  Location: [facility];  Service: Ophthalmology;  Laterality: Left;  CDE:12.62  . CATARACT EXTRACTION W/PHACO  11/25/2011   Procedure: CATARACT EXTRACTION PHACO AND INTRAOCULAR LENS PLACEMENT (IOC);  Surgeon: Tonny Branch, MD;  Location: [facility];  Service: Ophthalmology;  Laterality: Right;  CDE 16.14  . dental inplants     Family History  Problem Relation Age of Onset  . Stroke Mother 68  . Hip fracture Mother   . Lung cancer Father 21  . Anesthesia problems Neg Hx   . Hypotension Neg Hx   . Malignant hyperthermia Neg Hx   . Pseudochol deficiency Neg Hx   . Breast cancer Neg Hx    Social History   Socioeconomic  History  . Marital status: Widowed    Spouse name: Not on file  . Number of children: 0  . Years of education: college  . Highest education level: Not on file  Occupational History  . Occupation: retired  Scientific laboratory technician  . Financial resource strain: Not hard at all  . Food insecurity:    Worry: Never true    Inability: Never true  . Transportation needs:    Medical: No    Non-medical: No  Tobacco Use  . Smoking status: Former Smoker    Packs/day: 1.00    Years: 30.00    Pack years: 30.00    Types: Cigarettes    Last attempt to quit: 08/05/1978    Years since quitting: 39.7  . Smokeless tobacco: Never Used  . Tobacco comment: quit in 1980s  Substance and Sexual Activity  . Alcohol use: No  . Drug use: No  . Sexual activity: Never  Lifestyle  . Physical activity:    Days per week: 4 days    Minutes per session: 60 min  . Stress: Not at all  Relationships  . Social connections:    Talks on phone: More than three times a week    Gets together: More than three times a week    Attends religious service: More than 4 times per year    Active member of club or organization: No    Attends meetings of clubs or organizations: Never    Relationship status: Widowed  Other Topics Concern  . Not on file  Social History Narrative   Patient is retired and lives at home alone. Patient has business college education.    Right handed.   Caffeine- some times - soda     Outpatient Encounter Medications as of 04/13/2018  Medication Sig  . acetaminophen (TYLENOL) 500 MG tablet Take 1-2 tablets (500-1,000 mg total) by mouth 3 (three) times daily as needed (arthritis pain).  Marland Kitchen atorvastatin (LIPITOR) 10 MG tablet TAKE 1/2 TABLET BY MOUTH ONCE A DAY OR AS INSTRUCTED BY PHYSICIAN  . Calcium Carbonate-Vit D-Min 1200-1000 MG-UNIT CHEW Chew 1 tablet by mouth daily.  . chlorhexidine (PERIDEX) 0.12 % solution Mouthwash - Use as directed  . Cholecalciferol (VITAMIN D3) 1000 UNITS CAPS Take 1 capsule  by mouth daily.  Marland Kitchen diltiazem (CARDIZEM CD) 180 MG 24 hr capsule TAKE 1 CAPSULE (180 MG TOTAL) BY MOUTH DAILY.  . folic acid (FOLVITE) 1 MG tablet Take 1 mg by mouth daily.  Marland Kitchen ipratropium (ATROVENT) 0.06 % nasal spray PLACE 1 SPRAY INTO EACH NOSTRIL TWICE A DAY AS NEEDED  . Magnesium 500 MG TABS Take 1 tablet by mouth daily.  . Multiple Vitamin (MULTIVITAMIN) capsule Take  1 capsule by mouth daily.    Alveda Reasons 15 MG TABS tablet TAKE 1 TABLET BY MOUTH EVERY DAY  . [DISCONTINUED] predniSONE (DELTASONE) 20 MG tablet 2 po at sametime daily for 5 days   No facility-administered encounter medications on file as of 04/13/2018.     Activities of Daily Living In your present state of health, do you have any difficulty performing the following activities: 04/13/2018  Hearing? N  Vision? N  Difficulty concentrating or making decisions? N  Walking or climbing stairs? N  Dressing or bathing? N  Doing errands, shopping? N  Some recent data might be hidden  No difficulties with ADLS at this time  Patient Care Team: Chipper Herb, MD as PCP - General (Family Medicine) Thompson Grayer, MD as Consulting Physician (Cardiology)    Assessment:   This is a routine wellness examination for [name].  Exercise Activities and Dietary recommendations Current Exercise Habits: Structured exercise class, Type of exercise: strength training/weights, Time (Minutes): 60, Frequency (Times/Week): 4, Weekly Exercise (Minutes/Week): 240, Intensity: Mild  Goals    . Exercise 3x per week (30 min per time)     Continue to exercise for at least 30 minutes, 3 times weekly    . Have 3 meals a day     Eat at least 3 healthy meals daily       Fall Risk Fall Risk  04/13/2018 04/13/2018 01/28/2018 09/25/2017 06/04/2017  Falls in the past year? No No No No No  Number falls in past yr: - - - - -  Injury with Fall? - - - - -   Is the patient's home free of loose throw rugs in walkways, pet beds, electrical cords, etc?   yes       Grab bars in the bathroom? yes      Handrails on the stairs?   yes      Adequate lighting?   yes  Timed Get Up and Go performed:   Depression Screen PHQ 2/9 Scores 04/13/2018 04/13/2018 01/28/2018 09/25/2017  PHQ - 2 Score 0 0 0 0   No depression noted at this time  Cognitive Function MMSE - Mini Mental State Exam 04/13/2018 05/07/2016  Orientation to time 5 5  Orientation to Place 5 5  Registration 3 3  Attention/ Calculation 5 5  Recall 2 3  Language- name 2 objects 2 2  Language- repeat 1 1  Language- follow 3 step command 3 3  Language- read & follow direction 1 1  Write a sentence 1 1  Copy design 1 1  Total score 29 30  No memory loss noted at this time      Immunization History  Administered Date(s) Administered  . Influenza, High Dose Seasonal PF 05/07/2017  . Influenza,inj,Quad PF,6+ Mos 07/27/2013, 05/19/2014, 08/24/2015, 05/07/2016  . Influenza,inj,quad, With Preservative 04/07/2017  . Pneumococcal Conjugate-13 10/07/2013  . Tdap 05/07/2011  . Zoster 09/30/2014  Declines Shingrix and Pneumovax at this time.  Will be done at next office visit with Dr. Laurance Flatten  Qualifies for Shingles Vaccine? Yes will receive at next office visit  Screening Tests Health Maintenance  Topic Date Due  . INFLUENZA VACCINE  06/05/2018 (Originally 03/05/2018)  . PNA vac Low Risk Adult (2 of 2 - PPSV23) 09/25/2018 (Originally 10/08/2014)  . DEXA SCAN  05/07/2018  . MAMMOGRAM  10/24/2018  . TETANUS/TDAP  05/06/2021    Cancer Screenings: Lung: Low Dose CT Chest recommended if Age 42-80 years, 30 pack-year currently smoking OR  have quit w/in 15years. Patient does not qualify. Breast:  Up to date on Mammogram? Yes   Up to date of Bone Density/Dexa? Yes Colorectal: Up to Date  Additional Screenings:  Hepatitis C Screening:      Plan:   Encouraged [name] to continue to exercise for at least 30 minutes, 3 times weekly.  Encouraged to eat 3 health meals daily.  Encouraged to keep  follow up appointment with Dr. Laurance Flatten on 06/05/18 and Dr. Percival Spanish on 06/03/2018.  Explained the importance of Shingrix, Pneumovax and Flu vaccines.  Patient will have vaccines done at next office visit with Dr. Laurance Flatten per patient's request.   Copy of Advance directive requested to be scanned into chart.   I have personally reviewed and noted the following in the patient's chart:   . Medical and social history . Use of alcohol, tobacco or illicit drugs  . Current medications and supplements . Functional ability and status . Nutritional status . Physical activity . Advanced directives . List of other physicians . Hospitalizations, surgeries, and ER visits in previous 12 months . Vitals . Screenings to include cognitive, depression, and falls . Referrals and appointments  In addition, I have reviewed and discussed with patient certain preventive protocols, quality metrics, and best practice recommendations. A written personalized care plan for preventive services as well as general preventive health recommendations were provided to patient.     Wardell Heath, LPN  03/06/6414  I have reviewed and agree with the above AWV documentation.   Arrie Senate MD

## 2018-04-13 NOTE — Patient Instructions (Signed)
Keep Follow up appointment with Dr. Laurance Flatten and Dr. Percival Spanish Due for Flu Vaccine, Pnuemovax and shingrix- to be done at next appointment with Dr. Laurance Flatten Bring in a copy of Advance Directive to be scanned into chart.    Ms. Turay , Thank you for taking time to come for your Medicare Wellness Visit. I appreciate your ongoing commitment to your health goals. Please review the following plan we discussed and let me know if I can assist you in the future.   These are the goals we discussed: Goals    . Exercise 3x per week (30 min per time)     Continue to exercise for at least 30 minutes, 3 times weekly    . Have 3 meals a day     Eat at least 3 healthy meals daily       This is a list of the screening recommended for you and due dates:  Health Maintenance  Topic Date Due  . Flu Shot  06/05/2018*  . Pneumonia vaccines (2 of 2 - PPSV23) 09/25/2018*  . DEXA scan (bone density measurement)  05/07/2018  . Mammogram  10/24/2018  . Tetanus Vaccine  05/06/2021  *Topic was postponed. The date shown is not the original due date.    Heart-Healthy Eating Plan Heart-healthy meal planning includes:  Limiting unhealthy fats.  Increasing healthy fats.  Making other small dietary changes.  You may need to talk with your doctor or a diet specialist (dietitian) to create an eating plan that is right for you. What types of fat should I choose?  Choose healthy fats. These include olive oil and canola oil, flaxseeds, walnuts, almonds, and seeds.  Eat more omega-3 fats. These include salmon, mackerel, sardines, tuna, flaxseed oil, and ground flaxseeds. Try to eat fish at least twice each week.  Limit saturated fats. ? Saturated fats are often found in animal products, such as meats, butter, and cream. ? Plant sources of saturated fats include palm oil, palm kernel oil, and coconut oil.  Avoid foods with partially hydrogenated oils in them. These include stick margarine, some tub  margarines, cookies, crackers, and other baked goods. These contain trans fats. What general guidelines do I need to follow?  Check food labels carefully. Identify foods with trans fats or high amounts of saturated fat.  Fill one half of your plate with vegetables and green salads. Eat 4-5 servings of vegetables per day. A serving of vegetables is: ? 1 cup of raw leafy vegetables. ?  cup of raw or cooked cut-up vegetables. ?  cup of vegetable juice.  Fill one fourth of your plate with whole grains. Look for the word "whole" as the first word in the ingredient list.  Fill one fourth of your plate with lean protein foods.  Eat 4-5 servings of fruit per day. A serving of fruit is: ? One medium whole fruit. ?  cup of dried fruit. ?  cup of fresh, frozen, or canned fruit. ?  cup of 100% fruit juice.  Eat more foods that contain soluble fiber. These include apples, broccoli, carrots, beans, peas, and barley. Try to get 20-30 g of fiber per day.  Eat more home-cooked food. Eat less restaurant, buffet, and fast food.  Limit or avoid alcohol.  Limit foods high in starch and sugar.  Avoid fried foods.  Avoid frying your food. Try baking, boiling, grilling, or broiling it instead. You can also reduce fat by: ? Removing the skin from poultry. ? Removing all visible  fats from meats. ? Skimming the fat off of stews, soups, and gravies before serving them. ? Steaming vegetables in water or broth.  Lose weight if you are overweight.  Eat 4-5 servings of nuts, legumes, and seeds per week: ? One serving of dried beans or legumes equals  cup after being cooked. ? One serving of nuts equals 1 ounces. ? One serving of seeds equals  ounce or one tablespoon.  You may need to keep track of how much salt or sodium you eat. This is especially true if you have high blood pressure. Talk with your doctor or dietitian to get more information. What foods can I eat? Grains Breads, including  [place], white, pita, wheat, raisin, rye, oatmeal, and [place]. Tortillas that are neither fried nor made with lard or trans fat. Low-fat rolls, including hotdog and hamburger buns and English muffins. Biscuits. Muffins. Waffles. Pancakes. Light popcorn. Whole-grain cereals. Flatbread. Melba toast. Pretzels. Breadsticks. Rusks. Low-fat snacks. Low-fat crackers, including oyster, saltine, matzo, graham, animal, and rye. Rice and pasta, including brown rice and pastas that are made with whole wheat. Vegetables All vegetables. Fruits All fruits, but limit coconut. Meats and Other Protein Sources Lean, well-trimmed beef, veal, pork, and lamb. Chicken and [place] without skin. All fish and shellfish. Wild duck, rabbit, pheasant, and venison. Egg whites or low-cholesterol egg substitutes. Dried beans, peas, lentils, and tofu. Seeds and most nuts. Dairy Low-fat or nonfat cheeses, including ricotta, string, and mozzarella. Skim or 1% milk that is liquid, powdered, or evaporated. Buttermilk that is made with low-fat milk. Nonfat or low-fat yogurt. Beverages Mineral water. Diet carbonated beverages. Sweets and Desserts Sherbets and fruit ices. Honey, jam, marmalade, jelly, and syrups. Meringues and gelatins. Pure sugar candy, such as hard candy, jelly beans, gumdrops, mints, marshmallows, and small amounts of dark chocolate. [employer] Eat all sweets and desserts in moderation. Fats and Oils Nonhydrogenated (trans-free) margarines. Vegetable oils, including soybean, sesame, sunflower, olive, peanut, safflower, corn, canola, and cottonseed. Salad dressings or mayonnaise made with a vegetable oil. Limit added fats and oils that you use for cooking, baking, salads, and as spreads. Other Cocoa powder. Coffee and tea. All seasonings and condiments. The items listed above may not be a complete list of recommended foods or beverages. Contact your dietitian for more options. What foods are not  recommended? Grains Breads that are made with saturated or trans fats, oils, or whole milk. Croissants. Butter rolls. Cheese breads. Sweet rolls. Donuts. Buttered popcorn. Chow mein noodles. High-fat crackers, such as cheese or butter crackers. Meats and Other Protein Sources Fatty meats, such as hotdogs, short ribs, sausage, spareribs, bacon, rib eye roast or steak, and mutton. High-fat deli meats, such as salami and bologna. Caviar. Domestic duck and goose. Organ meats, such as kidney, liver, sweetbreads, and heart. Dairy Cream, sour cream, cream cheese, and creamed cottage cheese. Whole-milk cheeses, including blue (bleu), Monte[place] Jack, [place], [place], American, [place], Swiss, cheddar, [place], and [place]. Whole or 2% milk that is liquid, evaporated, or condensed. Whole buttermilk. Cream sauce or high-fat cheese sauce. Yogurt that is made from whole milk. Beverages Regular sodas and juice drinks with added sugar. Sweets and Desserts Frosting. Pudding. Cookies. Cakes other than angel food cake. Candy that has milk chocolate or white chocolate, hydrogenated fat, butter, coconut, or unknown ingredients. Buttered syrups. Full-fat ice cream or ice cream drinks. Fats and Oils Gravy that has suet, meat fat, or shortening. Cocoa butter, hydrogenated oils, palm oil, coconut oil, palm kernel oil. These  can often be found in baked products, candy, fried foods, nondairy creamers, and whipped toppings. Solid fats and shortenings, including bacon fat, salt pork, lard, and butter. Nondairy cream substitutes, such as coffee creamers and sour cream substitutes. Salad dressings that are made of unknown oils, cheese, or sour cream. The items listed above may not be a complete list of foods and beverages to avoid. Contact your dietitian for more information. This information is not intended to replace advice given to you by your health care provider. Make sure you discuss any questions you have with your health care  provider. Document Released: 01/21/2012 Document Revised: 12/28/2015 Document Reviewed: 01/13/2014 Elsevier Interactive Patient Education  [employer].

## 2018-04-28 ENCOUNTER — Other Ambulatory Visit: Payer: Self-pay | Admitting: Family Medicine

## 2018-05-05 ENCOUNTER — Other Ambulatory Visit (HOSPITAL_COMMUNITY): Payer: Self-pay | Admitting: Cardiology

## 2018-05-19 ENCOUNTER — Encounter

## 2018-06-02 NOTE — Progress Notes (Signed)
Cardiology Office Note   Date:  06/03/2018   ID:  [name], DOB 12/02/1930, MRN [id number]  PCP:  Chipper Herb, MD  Cardiologist:   Minus Breeding, MD   Chief Complaint  Patient presents with  . Atrial Fibrillation      History of Present Illness: [name] is a 84 y.o. female who presents for follow up of atrial fib.   She has chronic atrial fib.  She is managed with rate control and anticoagulation.  Since I last saw her she has done well.  She still mind dances.  She stays very active.  She denies any cardiovascular symptoms.  She does not feel any palpitations, presyncope or syncope.  She has no chest pressure, neck or arm discomfort.  She did have AVMs in the past but she has not had any bleeding issues or problems with her Xarelto.   Past Medical History:  Diagnosis Date  . Arthritis   . AV malformation of gastrointestinal tract    in cecum she denies history of GIB  . Breast mass, right   . Cataract   . CVA (cerebral vascular accident) ([place]) 2007  . Dizziness    denies this as an ongoing problem  . Hyperlipidemia   . Microcytic anemia   . Osteopenia   . Persistent atrial fibrillation    s/p PVI 04/2010  CHADS2vasc at least 5  . RLS (restless legs syndrome)   . Sinus bradycardia     Past Surgical History:  Procedure Laterality Date  . afib ablation  04/2010   PVI with CTI ablation performed by [facility]  . BREAST EXCISIONAL BIOPSY Right    benign cyst  . breast mass resected    . CARDIOVERSION     for afib  . CATARACT EXTRACTION W/PHACO  11/11/2011   Procedure: CATARACT EXTRACTION PHACO AND INTRAOCULAR LENS PLACEMENT (IOC);  Surgeon: Tonny Branch, MD;  Location: [facility];  Service: Ophthalmology;  Laterality: Left;  CDE:12.62  . CATARACT EXTRACTION W/PHACO  11/25/2011   Procedure: CATARACT EXTRACTION PHACO AND INTRAOCULAR LENS PLACEMENT (IOC);  Surgeon: Tonny Branch, MD;  Location: [facility];  Service: Ophthalmology;  Laterality: Right;  CDE  16.14  . dental inplants       Current Outpatient Medications  Medication Sig Dispense Refill  . acetaminophen (TYLENOL) 500 MG tablet Take 1-2 tablets (500-1,000 mg total) by mouth 3 (three) times daily as needed (arthritis pain). 30 tablet 0  . atorvastatin (LIPITOR) 10 MG tablet TAKE 1/2 TABLET BY MOUTH ONCE A DAY OR AS INSTRUCTED BY PHYSICIAN 45 tablet 0  . Calcium Carb-Cholecalciferol (CALCIUM 600+D) 600-800 MG-UNIT TABS Take by mouth daily.    . cetirizine (ZYRTEC) 10 MG tablet Take 10 mg by mouth daily.    . chlorhexidine (PERIDEX) 0.12 % solution Mouthwash - Use as directed    . Cholecalciferol (VITAMIN D3) 1000 UNITS CAPS Take 1 capsule by mouth daily.    Marland Kitchen diltiazem (CARDIZEM CD) 180 MG 24 hr capsule TAKE 1 CAPSULE (180 MG TOTAL) BY MOUTH DAILY. 90 capsule 3  . folic acid (FOLVITE) 683 MCG tablet Take 400 mcg by mouth daily.    Marland Kitchen ipratropium (ATROVENT) 0.06 % nasal spray PLACE 1 SPRAY INTO EACH NOSTRIL TWICE A DAY AS NEEDED 45 mL 0  . Magnesium 500 MG TABS Take 1 tablet by mouth daily.    . Multiple Vitamin (MULTIVITAMIN) capsule Take 1 capsule by mouth daily.      Alveda Reasons 15  MG TABS tablet TAKE 1 TABLET BY MOUTH EVERY DAY 90 tablet 1   No current facility-administered medications for this visit.     Allergies:   Patient has no known allergies.    ROS:  Please see the history of present illness.   Otherwise, review of systems are positive for NONE.   All other systems are reviewed and negative.    PHYSICAL EXAM: VS:  BP 122/70   Pulse (!) 108   Ht 5\' 3"  (1.6 m)   Wt 135 lb (61.2 kg)   BMI 23.91 kg/m  , BMI Body mass index is 23.91 kg/m. GENERAL:  Well appearing NECK:  No jugular venous distention, waveform within normal limits, carotid upstroke brisk and symmetric, no bruits, no thyromegaly LUNGS:  Clear to auscultation bilaterally CHEST:  Unremarkable HEART:  PMI not displaced or sustained,S1 and S2 within normal limits, no S3, no clicks, no rubs, no murmurs,  irregular ABD:  Flat, positive bowel sounds normal in frequency in pitch, no bruits, no rebound, no guarding, no midline pulsatile mass, no hepatomegaly, no splenomegaly EXT:  2 plus pulses throughout, no edema, no cyanosis no clubbing    EKG:  EKG is  ordered today. The ekg ordered today demonstrates atrial fibrillation, rate 108, axis within normal limits, intervals within normal limits, poor anterior R wave progression, no acute ST-T wave changes.   Recent Labs: 01/28/2018: ALT 7; BUN 15; Creatinine, Ser 1.08; Hemoglobin 12.9; Platelets 266; Potassium 4.7; Sodium 140; TSH 2.420    Lipid Panel    Component Value Date/Time   CHOL 151 01/28/2018 1049   CHOL 140 07/02/2013 1013   TRIG 72 01/28/2018 1049   TRIG 73 04/19/2016 0815   TRIG 71 07/02/2013 1013   HDL 61 01/28/2018 1049   HDL 62 04/19/2016 0815   HDL 61 07/02/2013 1013   CHOLHDL 2.5 01/28/2018 1049   LDLCALC 76 01/28/2018 1049   LDLCALC 76 01/17/2014 0914   LDLCALC 65 07/02/2013 1013      Wt Readings from Last 3 Encounters:  06/03/18 135 lb (61.2 kg)  04/13/18 133 lb 6.4 oz (60.5 kg)  03/28/18 135 lb (61.2 kg)      Other studies Reviewed: Additional studies/ records that were reviewed today include: Labs Review of the above records demonstrates:     ASSESSMENT AND PLAN:  Permanent atrial fibrillation:    [name] has a CHA2DS2 - VASc score of 5.   Her heart rates slightly elevated.  I would like for her to wear a 24-hour Holter so that I can judge rate control.  She tolerates anticoagulation.  Her GFR is borderline but she is on the current right dose.    Current medicines are reviewed at length with the patient today.  The patient does not have concerns regarding medicines.  The following changes have been made:  None  Labs/ tests ordered today include:   Orders Placed This Encounter  Procedures  . HOLTER MONITOR - 24 HOUR  . EKG 12-Lead     Disposition:   FU with me in 12  months.     Signed, Minus Breeding, MD  06/03/2018 3:45 PM    [facility]

## 2018-06-03 ENCOUNTER — Encounter: Payer: Self-pay | Admitting: Cardiology

## 2018-06-03 ENCOUNTER — Ambulatory Visit (INDEPENDENT_AMBULATORY_CARE_PROVIDER_SITE_OTHER): Payer: Medicare Other | Admitting: Cardiology

## 2018-06-03 VITALS — BP 122/70 | HR 108 | Ht 63.0 in | Wt 135.0 lb

## 2018-06-03 DIAGNOSIS — I4821 Permanent atrial fibrillation: Secondary | ICD-10-CM

## 2018-06-03 NOTE — Patient Instructions (Addendum)
Medication Instructions:  The current medical regimen is effective;  continue present plan and medications.  If you need a refill on your cardiac medications before your next appointment, please call your pharmacy.   Testing/Procedures: Your physician has recommended that you wear a holter monitor. Holter monitors are medical devices that record the heart's electrical activity. Doctors most often use these monitors to diagnose arrhythmias. Arrhythmias are problems with the speed or rhythm of the heartbeat. The monitor is a small, portable device. You can wear one while you do your normal daily activities. This is usually used to diagnose what is causing palpitations/syncope (passing out). Register at the Main Entrance at 12:45  Follow-Up: Follow up in 1 year with Dr. Percival Spanish.  You will receive a letter in the mail 2 months before you are due.  Please call us when you receive this letter to schedule your follow up appointment.  Thank you for choosing [facility]!!

## 2018-06-04 ENCOUNTER — Encounter (HOSPITAL_COMMUNITY): Payer: Medicare Other

## 2018-06-05 ENCOUNTER — Encounter: Payer: Self-pay | Admitting: Family Medicine

## 2018-06-05 ENCOUNTER — Ambulatory Visit: Payer: Medicare Other | Admitting: Family Medicine

## 2018-06-05 ENCOUNTER — Ambulatory Visit (INDEPENDENT_AMBULATORY_CARE_PROVIDER_SITE_OTHER): Payer: Medicare Other

## 2018-06-05 VITALS — BP 106/70 | HR 95 | Temp 97.1°F | Ht 63.0 in | Wt 132.0 lb

## 2018-06-05 DIAGNOSIS — Z780 Asymptomatic menopausal state: Secondary | ICD-10-CM | POA: Diagnosis not present

## 2018-06-05 DIAGNOSIS — I480 Paroxysmal atrial fibrillation: Secondary | ICD-10-CM | POA: Diagnosis not present

## 2018-06-05 DIAGNOSIS — E559 Vitamin D deficiency, unspecified: Secondary | ICD-10-CM | POA: Diagnosis not present

## 2018-06-05 DIAGNOSIS — Z13820 Encounter for screening for osteoporosis: Secondary | ICD-10-CM

## 2018-06-05 DIAGNOSIS — R82998 Other abnormal findings in urine: Secondary | ICD-10-CM

## 2018-06-05 DIAGNOSIS — E079 Disorder of thyroid, unspecified: Secondary | ICD-10-CM | POA: Diagnosis not present

## 2018-06-05 DIAGNOSIS — E7800 Pure hypercholesterolemia, unspecified: Secondary | ICD-10-CM | POA: Diagnosis not present

## 2018-06-05 DIAGNOSIS — J209 Acute bronchitis, unspecified: Secondary | ICD-10-CM

## 2018-06-05 DIAGNOSIS — R351 Nocturia: Secondary | ICD-10-CM

## 2018-06-05 DIAGNOSIS — J019 Acute sinusitis, unspecified: Secondary | ICD-10-CM

## 2018-06-05 LAB — URINALYSIS, COMPLETE
Bilirubin, UA: NEGATIVE
Glucose, UA: NEGATIVE
LEUKOCYTES UA: NEGATIVE
Nitrite, UA: NEGATIVE
Specific Gravity, UA: 1.015 (ref 1.005–1.030)
Urobilinogen, Ur: 0.2 mg/dL (ref 0.2–1.0)
pH, UA: 6 (ref 5.0–7.5)

## 2018-06-05 LAB — MICROSCOPIC EXAMINATION: Renal Epithel, UA: NONE SEEN /hpf

## 2018-06-05 MED ORDER — CEFDINIR 300 MG PO CAPS: 300.0000 mg | ORAL_CAPSULE | Freq: Two times a day (BID) | ORAL | 0 refills | Status: DC

## 2018-06-05 NOTE — Progress Notes (Signed)
Subjective:    Patient ID: [name], female    DOB: 25-Oct-1930, 84 y.o.   MRN: [id number]  HPI Pt here for follow up and management of chronic medical problems which includes hyperlipidemia and a fib. She is taking medication regularly.  [name] is doing well overall but does complain of some cold and congestion symptoms today.  She does not need any refills on her vital signs are stable and she is not running any fever.  She is due to get a pelvic exam and will arrange this.  She will get a DEXA scan today and will get lab work today.  She is due also for a flu shot.  The patient has a history of irritated airways and bronchospasm.  This is been also running in her family.  She did make a trip and stay in a different location about a week ago about when this all started.  She complains of nasal congestion and coughing and yellow drainage and sputum.  She has not had any significant shortness of breath and she has not had any fever.  She did see the cardiologist yesterday and he plans to do a monitor because of her heart rate.  He may increase some of her medicine.  She denies any trouble with her stomach including nausea vomiting diarrhea blood in the stool or black tarry bowel movements or change in bowel habits.  She does say that her urine has a darker color and she is been having some nocturia.  She does complain of some facial tenderness.  Not severe.  Patient Active Problem List   Diagnosis Date Noted  . AV malformation of gastrointestinal tract   . Acute bronchitis 04/06/2015  . Primary osteoarthritis involving multiple joints 02/07/2015  . High risk medication use 06/21/2013  . Dizziness   . Restless leg syndrome 03/01/2013  . ATRIAL FIBRILLATION 11/03/2008  . Hyperlipidemia 10/24/2008  . ANEMIA, IRON DEFICIENCY, MICROCYTIC 10/24/2008  . SINUS BRADYCARDIA 10/24/2008  . CVA 10/24/2008  . OSTEOPENIA 10/24/2008   Outpatient Encounter Medications as of 06/05/2018  Medication  Sig  . acetaminophen (TYLENOL) 500 MG tablet Take 1-2 tablets (500-1,000 mg total) by mouth 3 (three) times daily as needed (arthritis pain).  Marland Kitchen atorvastatin (LIPITOR) 10 MG tablet TAKE 1/2 TABLET BY MOUTH ONCE A DAY OR AS INSTRUCTED BY PHYSICIAN  . Calcium Carb-Cholecalciferol (CALCIUM 600+D) 600-800 MG-UNIT TABS Take by mouth daily.  . cetirizine (ZYRTEC) 10 MG tablet Take 10 mg by mouth daily.  . chlorhexidine (PERIDEX) 0.12 % solution Mouthwash - Use as directed  . Cholecalciferol (VITAMIN D3) 1000 UNITS CAPS Take 1 capsule by mouth daily.  Marland Kitchen diltiazem (CARDIZEM CD) 180 MG 24 hr capsule TAKE 1 CAPSULE (180 MG TOTAL) BY MOUTH DAILY.  . folic acid (FOLVITE) 662 MCG tablet Take 400 mcg by mouth daily.  Marland Kitchen ipratropium (ATROVENT) 0.06 % nasal spray PLACE 1 SPRAY INTO EACH NOSTRIL TWICE A DAY AS NEEDED  . Magnesium 500 MG TABS Take 1 tablet by mouth daily.  . Multiple Vitamin (MULTIVITAMIN) capsule Take 1 capsule by mouth daily.    Alveda Reasons 15 MG TABS tablet TAKE 1 TABLET BY MOUTH EVERY DAY   No facility-administered encounter medications on file as of 06/05/2018.      Review of Systems  Constitutional: Negative.   HENT: Positive for congestion.   Eyes: Negative.   Respiratory: Positive for cough.   Cardiovascular: Negative.   Gastrointestinal: Negative.   Endocrine: Negative.   Genitourinary:  Negative.   Musculoskeletal: Negative.   Skin: Negative.   Allergic/Immunologic: Negative.   Neurological: Negative.   Hematological: Negative.   Psychiatric/Behavioral: Negative.        Objective:   Physical Exam  Constitutional: She is oriented to person, place, and time. She appears well-developed and well-nourished. No distress.  The patient is pleasant and alert and looks much younger than her stated age of 59 years.  HENT:  Head: Normocephalic and atraumatic.  Right Ear: External ear normal.  Left Ear: External ear normal.  Mouth/Throat: Oropharynx is clear and moist.  Nasal  turbinate congestion bilaterally and throat is normal.  Eyes: Pupils are equal, round, and reactive to light. Conjunctivae and EOM are normal. Right eye exhibits no discharge. Left eye exhibits no discharge. No scleral icterus.  Neck: Normal range of motion. Neck supple. No thyromegaly present.  No bruits thyromegaly or anterior cervical adenopathy  Cardiovascular: Normal heart sounds and intact distal pulses.  No murmur heard. The heart is irregular irregular at 108/min.  The cardiologist has plan to do a monitor on her heart.  Pulmonary/Chest: Effort normal. No respiratory distress. She has no wheezes. She has no rales.  Coarse breath sounds with coughing.  No rales or minimal wheezes.  Abdominal: Soft. Bowel sounds are normal. She exhibits no distension and no mass. There is tenderness. There is no rebound and no guarding.  No liver or spleen enlargement slight suprapubic tenderness.  No masses or bruits.  Musculoskeletal: Normal range of motion. She exhibits no edema.  Lymphadenopathy:    She has no cervical adenopathy.  Neurological: She is alert and oriented to person, place, and time. She has normal reflexes. No cranial nerve deficit.  Skin: Skin is warm and dry. No rash noted.  Psychiatric: She has a normal mood and affect. Her behavior is normal. Judgment and thought content normal.  The patient's mood affect and behavior are all normal for her.  Nursing note and vitals reviewed.  BP 106/70 (BP Location: Left Arm)   Pulse 95   Temp (!) 97.1 F (36.2 C) (Oral)   Ht '5\' 3"'  (1.6 m)   Wt 132 lb (59.9 kg)   BMI 23.38 kg/m         Assessment & Plan:  1. Pure hypercholesterolemia -Continue with current treatment and as aggressive therapeutic lifestyle changes as possible - BMP8+EGFR - CBC with Differential/Platelet - Lipid panel - Hepatic function panel  2. Thyroid disease - CBC with Differential/Platelet  3. Vitamin D deficiency -Continue with vitamin D replacement  pending results of lab work - DG Impact; Future - CBC with Differential/Platelet - VITAMIN D 25 Hydroxy (Vit-D Deficiency, Fractures)  4. Paroxysmal atrial fibrillation (HCC) -Continue with current treatment and follow-up with monitor as planned by cardiologist - BMP8+EGFR - CBC with Differential/Platelet  5. Screening for osteoporosis - DG [facility] DEXA; Future  6. Postmenopausal - DG [facility] DEXA; Future  7. Dark brown-colored urine -Check urinalysis and treat appropriately -Continue to drink plenty of fluids and stay well-hydrated - Urine Culture - Urinalysis, Complete  8. Acute rhinosinusitis -Take Tylenol for pain, use nasal saline, take antibiotic as directed  9. Bronchitis with bronchospasm -Take plain Mucinex twice daily as directed and drink plenty of water and take antibiotic as directed  10. Nocturia -Check urinalysis and treat accordingly  Meds ordered this encounter  Medications  . cefdinir (OMNICEF) 300 MG capsule    Sig: Take 1 capsule (300 mg total) by mouth 2 (two) times daily.  1 po BID    Dispense:  20 capsule    Refill:  0   Patient Instructions                       Medicare Annual Wellness Visit  [facility] and the medical providers at [facility] strive to bring you the best medical care.  In doing so we not only want to address your current medical conditions and concerns but also to detect new conditions early and prevent illness, disease and health-related problems.    Medicare offers a yearly Wellness Visit which allows our clinical staff to assess your need for preventative services including immunizations, lifestyle education, counseling to decrease risk of preventable diseases and screening for fall risk and other medical concerns.    This visit is provided free of charge (no copay) for all Medicare recipients. The clinical pharmacists at [facility] have begun to conduct these Wellness Visits which  will also include a thorough review of all your medications.    As you primary medical provider recommend that you make an appointment for your Annual Wellness Visit if you have not done so already this year.  You may set up this appointment before you leave today or you may call back ([phone]) and schedule an appointment.  Please make sure when you call that you mention that you are scheduling your Annual Wellness Visit with the clinical pharmacist so that the appointment may be made for the proper length of time.     Continue current medications. Continue good therapeutic lifestyle changes which include good diet and exercise. Fall precautions discussed with patient. If an FOBT was given today- please return it to our front desk. If you are over 69 years old - you may need Prevnar 31 or the adult Pneumonia vaccine.  **Flu shots are available--- please call and schedule a FLU-CLINIC appointment**  After your visit with [place] today you will receive a survey in the mail or online from [employer] regarding your care with [place]. Please take a moment to fill this out. Your feedback is very important to [place] as you can help [place] better understand your patient needs as well as improve your experience and satisfaction. WE CARE ABOUT YOU!!!   Drink more fluids and stay well-hydrated Take Mucinex maximum strength, plain, blue and white in color, 1 twice daily for cough and congestion with a large glass of water Use nasal saline frequently If prescribed an antibiotic, take this regularly and until completed We will call with the results of the urine evaluation as soon as those results become available    Arrie Senate MD

## 2018-06-05 NOTE — Patient Instructions (Addendum)
Medicare Annual Wellness Visit  [facility] and the medical providers at [facility] strive to bring you the best medical care.  In doing so we not only want to address your current medical conditions and concerns but also to detect new conditions early and prevent illness, disease and health-related problems.    Medicare offers a yearly Wellness Visit which allows our clinical staff to assess your need for preventative services including immunizations, lifestyle education, counseling to decrease risk of preventable diseases and screening for fall risk and other medical concerns.    This visit is provided free of charge (no copay) for all Medicare recipients. The clinical pharmacists at [facility] have begun to conduct these Wellness Visits which will also include a thorough review of all your medications.    As you primary medical provider recommend that you make an appointment for your Annual Wellness Visit if you have not done so already this year.  You may set up this appointment before you leave today or you may call back ([phone]) and schedule an appointment.  Please make sure when you call that you mention that you are scheduling your Annual Wellness Visit with the clinical pharmacist so that the appointment may be made for the proper length of time.     Continue current medications. Continue good therapeutic lifestyle changes which include good diet and exercise. Fall precautions discussed with patient. If an FOBT was given today- please return it to our front desk. If you are over 11 years old - you may need Prevnar 61 or the adult Pneumonia vaccine.  **Flu shots are available--- please call and schedule a FLU-CLINIC appointment**  After your visit with [place] today you will receive a survey in the mail or online from [employer] regarding your care with [place]. Please take a moment to fill this out. Your feedback is very  important to [place] as you can help [place] better understand your patient needs as well as improve your experience and satisfaction. WE CARE ABOUT YOU!!!   Drink more fluids and stay well-hydrated Take Mucinex maximum strength, plain, blue and white in color, 1 twice daily for cough and congestion with a large glass of water Use nasal saline frequently If prescribed an antibiotic, take this regularly and until completed We will call with the results of the urine evaluation as soon as those results become available Do not forget to get flu shot once antibiotic is completed

## 2018-06-06 LAB — HEPATIC FUNCTION PANEL
ALBUMIN: 4.4 g/dL (ref 3.5–4.7)
ALT: 10 IU/L (ref 0–32)
AST: 23 IU/L (ref 0–40)
Alkaline Phosphatase: 75 IU/L (ref 39–117)
BILIRUBIN TOTAL: 0.5 mg/dL (ref 0.0–1.2)
Bilirubin, Direct: 0.18 mg/dL (ref 0.00–0.40)
Total Protein: 7.4 g/dL (ref 6.0–8.5)

## 2018-06-06 LAB — CBC WITH DIFFERENTIAL/PLATELET
Basophils Absolute: 0.1 10*3/uL (ref 0.0–0.2)
Basos: 1 %
EOS (ABSOLUTE): 0 10*3/uL (ref 0.0–0.4)
Eos: 0 %
Hematocrit: 41.1 % (ref 34.0–46.6)
Hemoglobin: 13.8 g/dL (ref 11.1–15.9)
IMMATURE GRANULOCYTES: 0 %
Immature Grans (Abs): 0 10*3/uL (ref 0.0–0.1)
LYMPHS ABS: 2.2 10*3/uL (ref 0.7–3.1)
Lymphs: 20 %
MCH: 31 pg (ref 26.6–33.0)
MCHC: 33.6 g/dL (ref 31.5–35.7)
MCV: 92 fL (ref 79–97)
MONOS ABS: 1.7 10*3/uL — AB (ref 0.1–0.9)
Monocytes: 16 %
NEUTROS PCT: 63 %
Neutrophils Absolute: 6.8 10*3/uL (ref 1.4–7.0)
PLATELETS: 282 10*3/uL (ref 150–450)
RBC: 4.45 x10E6/uL (ref 3.77–5.28)
RDW: 12.3 % (ref 12.3–15.4)
WBC: 10.8 10*3/uL (ref 3.4–10.8)

## 2018-06-06 LAB — BMP8+EGFR
BUN/Creatinine Ratio: 14 (ref 12–28)
BUN: 16 mg/dL (ref 8–27)
CHLORIDE: 102 mmol/L (ref 96–106)
CO2: 21 mmol/L (ref 20–29)
CREATININE: 1.17 mg/dL — AB (ref 0.57–1.00)
Calcium: 10.1 mg/dL (ref 8.7–10.3)
GFR calc Af Amer: 49 mL/min/{1.73_m2} — ABNORMAL LOW (ref >59)
GFR calc non Af Amer: 43 mL/min/{1.73_m2} — ABNORMAL LOW (ref >59)
GLUCOSE: 91 mg/dL (ref 65–99)
POTASSIUM: 5.1 mmol/L (ref 3.5–5.2)
SODIUM: 142 mmol/L (ref 134–144)

## 2018-06-06 LAB — LIPID PANEL
CHOL/HDL RATIO: 2.5 ratio (ref 0.0–4.4)
Cholesterol, Total: 159 mg/dL (ref 100–199)
HDL: 63 mg/dL (ref >39)
LDL Calculated: 76 mg/dL (ref 0–99)
Triglycerides: 100 mg/dL (ref 0–149)
VLDL CHOLESTEROL CAL: 20 mg/dL (ref 5–40)

## 2018-06-06 LAB — URINE CULTURE

## 2018-06-06 LAB — VITAMIN D 25 HYDROXY (VIT D DEFICIENCY, FRACTURES): Vit D, 25-Hydroxy: 48.4 ng/mL (ref 30.0–100.0)

## 2018-06-09 ENCOUNTER — Ambulatory Visit (HOSPITAL_COMMUNITY)
Admission: RE | Admit: 2018-06-09 | Discharge: 2018-06-09 | Disposition: A | Discharge status: Home / Self Care | Payer: Medicare Other | Source: Ambulatory Visit | Attending: Cardiology | Admitting: Cardiology

## 2018-06-09 DIAGNOSIS — I4821 Permanent atrial fibrillation: Secondary | ICD-10-CM | POA: Insufficient documentation

## 2018-06-18 ENCOUNTER — Other Ambulatory Visit: Payer: Self-pay | Admitting: Family Medicine

## 2018-06-25 ENCOUNTER — Encounter: Payer: Medicare Other | Admitting: Nurse Practitioner

## 2018-06-26 ENCOUNTER — Encounter: Payer: Self-pay | Admitting: Nurse Practitioner

## 2018-06-26 ENCOUNTER — Ambulatory Visit (INDEPENDENT_AMBULATORY_CARE_PROVIDER_SITE_OTHER): Payer: Medicare Other | Admitting: Nurse Practitioner

## 2018-06-26 VITALS — BP 102/69 | HR 78 | Temp 97.1°F | Ht 63.0 in | Wt 132.0 lb

## 2018-06-26 DIAGNOSIS — Z0000 Encounter for general adult medical examination without abnormal findings: Secondary | ICD-10-CM | POA: Diagnosis not present

## 2018-06-26 DIAGNOSIS — Z23 Encounter for immunization: Secondary | ICD-10-CM | POA: Diagnosis not present

## 2018-06-26 NOTE — Patient Instructions (Signed)

## 2018-06-26 NOTE — Progress Notes (Signed)
   Subjective:    Patient ID: MONTSERRATH MADDING, female    DOB: August 23, 1930, 84 y.o.   MRN: [id number]   Chief Complaint: pap and breast exam  HPI Ms. Barrett is a regular patient of dr. Laurance Flatten. She was last seen for follow up on 06/05/18. According to note there was no changes made to plan of care. She is here today for pap and breast exam. She says she is doing well today without complaints   Review of Systems  Constitutional: Negative for activity change and appetite change.  HENT: Negative.   Eyes: Negative for pain.  Respiratory: Negative for shortness of breath.   Cardiovascular: Negative for chest pain, palpitations and leg swelling.  Gastrointestinal: Negative for abdominal pain.  Endocrine: Negative for polydipsia.  Genitourinary: Negative.   Skin: Negative for rash.  Neurological: Negative for dizziness, weakness and headaches.  Hematological: Does not bruise/bleed easily.  Psychiatric/Behavioral: Negative.   All other systems reviewed and are negative.      Objective:   Physical Exam  Constitutional: She is oriented to person, place, and time. She appears well-developed and well-nourished. No distress.  HENT:  Head: Normocephalic.  Nose: Nose normal.  Mouth/Throat: Oropharynx is clear and moist.  Eyes: Pupils are equal, round, and reactive to light. EOM are normal.  Neck: Normal range of motion. Neck supple. No JVD present. Carotid bruit is not present.  Cardiovascular: Normal rate, regular rhythm, normal heart sounds and intact distal pulses.  Pulmonary/Chest: Effort normal and breath sounds normal. No respiratory distress. She has no wheezes. She has no rales. She exhibits no tenderness.  Abdominal: Soft. Normal appearance, normal aorta and bowel sounds are normal. She exhibits no distension, no abdominal bruit, no pulsatile midline mass and no mass. There is no splenomegaly or hepatomegaly. There is no tenderness.  Genitourinary: Vagina normal and uterus  normal. No vaginal discharge found.  Genitourinary Comments: No adnexal masses or tenderness  Musculoskeletal: Normal range of motion. She exhibits no edema.  Lymphadenopathy:    She has no cervical adenopathy.  Neurological: She is alert and oriented to person, place, and time. She has normal reflexes.  Skin: Skin is warm and dry.  Psychiatric: She has a normal mood and affect. Her behavior is normal. Judgment and thought content normal.  Nursing note and vitals reviewed.   BP 102/69   Pulse 78   Temp (!) 97.1 F (36.2 C) (Oral)   Ht 5\' 3"  (1.6 m)   Wt 132 lb (59.9 kg)   BMI 23.38 kg/m         Assessment & Plan:  RILEYANN FLORANCE comes in today with chief complaint of No chief complaint on file.   Diagnosis and orders addressed:  1. Well adult exam  - Pap IG (Image Guided)   Keep follow up appointmnet swith dr. Laurance Flatten Health Maintenance reviewed Diet and exercise encouraged  Follow up plan: 6 months   Miranda, FNP

## 2018-06-29 ENCOUNTER — Other Ambulatory Visit: Payer: Self-pay | Admitting: Family Medicine

## 2018-06-29 LAB — PAP IG (IMAGE GUIDED)

## 2018-06-29 MED ORDER — IPRATROPIUM BROMIDE 0.06 % NA SOLN: NASAL | 1 refills | Status: DC

## 2018-06-29 NOTE — Addendum Note (Signed)
Addended by: Antonietta Barcelona D on: 06/29/2018 02:29 PM   Modules accepted: Orders

## 2018-06-29 NOTE — Telephone Encounter (Signed)
Refill failed. resent °

## 2018-07-30 ENCOUNTER — Other Ambulatory Visit: Payer: Self-pay | Admitting: Family Medicine

## 2018-09-25 ENCOUNTER — Other Ambulatory Visit: Payer: Self-pay | Admitting: Family Medicine

## 2018-09-25 DIAGNOSIS — Z1231 Encounter for screening mammogram for malignant neoplasm of breast: Secondary | ICD-10-CM

## 2018-10-14 ENCOUNTER — Ambulatory Visit: Payer: Medicare Other | Admitting: Family Medicine

## 2018-10-14 ENCOUNTER — Encounter: Payer: Self-pay | Admitting: Family Medicine

## 2018-10-14 ENCOUNTER — Other Ambulatory Visit: Payer: Self-pay

## 2018-10-14 ENCOUNTER — Ambulatory Visit (INDEPENDENT_AMBULATORY_CARE_PROVIDER_SITE_OTHER): Payer: Medicare Other

## 2018-10-14 VITALS — BP 119/70 | HR 103 | Temp 97.6°F | Ht 63.0 in | Wt 130.0 lb

## 2018-10-14 DIAGNOSIS — E559 Vitamin D deficiency, unspecified: Secondary | ICD-10-CM | POA: Diagnosis not present

## 2018-10-14 DIAGNOSIS — E7800 Pure hypercholesterolemia, unspecified: Secondary | ICD-10-CM | POA: Diagnosis not present

## 2018-10-14 DIAGNOSIS — E079 Disorder of thyroid, unspecified: Secondary | ICD-10-CM

## 2018-10-14 DIAGNOSIS — I480 Paroxysmal atrial fibrillation: Secondary | ICD-10-CM | POA: Diagnosis not present

## 2018-10-14 MED ORDER — DILTIAZEM HCL ER COATED BEADS 180 MG PO CP24: ORAL_CAPSULE | ORAL | 3 refills | Status: DC

## 2018-10-14 NOTE — Patient Instructions (Addendum)
Medicare Annual Wellness Visit  [facility] and the medical providers at [facility] strive to bring you the best medical care.  In doing so we not only want to address your current medical conditions and concerns but also to detect new conditions early and prevent illness, disease and health-related problems.    Medicare offers a yearly Wellness Visit which allows our clinical staff to assess your need for preventative services including immunizations, lifestyle education, counseling to decrease risk of preventable diseases and screening for fall risk and other medical concerns.    This visit is provided free of charge (no copay) for all Medicare recipients. The clinical pharmacists at [facility] have begun to conduct these Wellness Visits which will also include a thorough review of all your medications.    As you primary medical provider recommend that you make an appointment for your Annual Wellness Visit if you have not done so already this year.  You may set up this appointment before you leave today or you may call back ([phone]) and schedule an appointment.  Please make sure when you call that you mention that you are scheduling your Annual Wellness Visit with the clinical pharmacist so that the appointment may be made for the proper length of time.    Continue current medications. Continue good therapeutic lifestyle changes which include good diet and exercise. Fall precautions discussed with patient. If an FOBT was given today- please return it to our front desk. If you are over 83 years old - you may need Prevnar 49 or the adult Pneumonia vaccine.  **Flu shots are available--- please call and schedule a FLU-CLINIC appointment**  After your visit with [place] today you will receive a survey in the mail or online from [employer] regarding your care with [place]. Please take a moment to fill this out. Your feedback is very  important to [place] as you can help [place] better understand your patient needs as well as improve your experience and satisfaction. WE CARE ABOUT YOU!!!   Drink plenty of fluids and stay well-hydrated Practice good hand hygiene and respiratory hygiene Avoid crowds of people as much as possible Continue to follow-up with cardiology as planned We will call with lab work results as soon as these results become available

## 2018-10-14 NOTE — Progress Notes (Signed)
Subjective:    Patient ID: [name], female    DOB: 09-20-1930, 85 y.o.   MRN: [id number]  HPI Pt here for follow up and management of chronic medical problems which includes a fib and hyperlipidemia. She is taking medication regularly.  Patient is doing well today with no specific complaints.  She is requesting a refill on her cardia zyme.  She will get a chest x-ray today and get lab work today.  She has a history of atrial fibrillation hyperlipidemia osteoarthritis.  Is pleasant and relaxed and in very good spirits and looks much younger than her stated age of 85 years of old.  Today she denies any chest pain pressure tightness or shortness of breath.  She denies any trouble with swallowing heartburn indigestion nausea vomiting diarrhea blood in the stool or black tarry bowel movements.  She is passing her water well and as usual gets up 2-3 times at nighttime.  She still tries to drink plenty of water and fluids.   Patient Active Problem List   Diagnosis Date Noted  . AV malformation of gastrointestinal tract   . Acute bronchitis 04/06/2015  . Primary osteoarthritis involving multiple joints 02/07/2015  . High risk medication use 06/21/2013  . Dizziness   . Restless leg syndrome 03/01/2013  . ATRIAL FIBRILLATION 11/03/2008  . Hyperlipidemia 10/24/2008  . ANEMIA, IRON DEFICIENCY, MICROCYTIC 10/24/2008  . SINUS BRADYCARDIA 10/24/2008  . CVA 10/24/2008  . OSTEOPENIA 10/24/2008   Outpatient Encounter Medications as of 10/14/2018  Medication Sig  . acetaminophen (TYLENOL) 500 MG tablet Take 1-2 tablets (500-1,000 mg total) by mouth 3 (three) times daily as needed (arthritis pain).  Marland Kitchen atorvastatin (LIPITOR) 10 MG tablet TAKE 1/2 TABLET BY MOUTH ONCE A DAY OR AS INSTRUCTED BY PHYSICIAN  . Calcium Carb-Cholecalciferol (CALCIUM 600+D) 600-800 MG-UNIT TABS Take by mouth daily.  . chlorhexidine (PERIDEX) 0.12 % solution Mouthwash - Use as directed  . Cholecalciferol (VITAMIN D3)  1000 UNITS CAPS Take 1 capsule by mouth daily.  Marland Kitchen diltiazem (CARDIZEM CD) 180 MG 24 hr capsule TAKE 1 CAPSULE (180 MG TOTAL) BY MOUTH DAILY.  . folic acid (FOLVITE) 338 MCG tablet Take 400 mcg by mouth daily.  Marland Kitchen ipratropium (ATROVENT) 0.06 % nasal spray PLACE 1 SPRAY INTO EACH NOSTRIL TWICE A DAY AS NEEDED  . Magnesium 500 MG TABS Take 1 tablet by mouth daily.  . Multiple Vitamin (MULTIVITAMIN) capsule Take 1 capsule by mouth daily.    Alveda Reasons 15 MG TABS tablet TAKE 1 TABLET BY MOUTH EVERY DAY   No facility-administered encounter medications on file as of 10/14/2018.       Review of Systems  Constitutional: Negative.   HENT: Negative.   Eyes: Negative.   Respiratory: Negative.   Cardiovascular: Negative.   Gastrointestinal: Negative.   Endocrine: Negative.   Genitourinary: Negative.   Musculoskeletal: Negative.   Skin: Negative.   Allergic/Immunologic: Negative.   Neurological: Negative.   Hematological: Negative.   Psychiatric/Behavioral: Negative.        Objective:   Physical Exam Vitals signs and nursing note reviewed.  Constitutional:      General: She is not in acute distress.    Appearance: Normal appearance. She is well-developed and normal weight.     Comments: Patient is pleasant and alert and looks much younger than her stated age  HENT:     Head: Normocephalic and atraumatic.     Right Ear: Tympanic membrane, ear canal and external ear normal. There is  no impacted cerumen.     Left Ear: Tympanic membrane, ear canal and external ear normal. There is no impacted cerumen.     Nose: Nose normal. No congestion.     Mouth/Throat:     Mouth: Mucous membranes are moist.     Pharynx: Oropharynx is clear. No oropharyngeal exudate.  Eyes:     General: No scleral icterus.       Right eye: No discharge.        Left eye: No discharge.     Extraocular Movements: Extraocular movements intact.     Conjunctiva/sclera: Conjunctivae normal.     Pupils: Pupils are equal,  round, and reactive to light.  Neck:     Musculoskeletal: Normal range of motion and neck supple.     Thyroid: No thyromegaly.     Vascular: No carotid bruit or JVD.     Comments: No bruits thyromegaly or anterior cervical adenopathy Cardiovascular:     Rate and Rhythm: Normal rate. Rhythm irregular.     Heart sounds: Normal heart sounds. No murmur.     Comments: The heart is irregular irregular at 84/min with no pedal edema.  The pulses were slightly diminished on the right compared to the left. Pulmonary:     Effort: Pulmonary effort is normal.     Breath sounds: Normal breath sounds. No wheezing or rales.     Comments: Clear anteriorly and posteriorly Abdominal:     General: Bowel sounds are normal.     Palpations: Abdomen is soft. There is no mass.     Tenderness: There is no abdominal tenderness.     Comments: The abdomen was soft without masses organ enlargement or bruits  Musculoskeletal: Normal range of motion.        General: No tenderness.     Right lower leg: No edema.     Left lower leg: No edema.  Lymphadenopathy:     Cervical: No cervical adenopathy.  Skin:    General: Skin is warm and dry.     Findings: No rash.  Neurological:     General: No focal deficit present.     Mental Status: She is alert and oriented to person, place, and time.     Cranial Nerves: No cranial nerve deficit.     Gait: Gait normal.     Deep Tendon Reflexes: Reflexes are normal and symmetric. Reflexes normal.  Psychiatric:        Mood and Affect: Mood normal.        Behavior: Behavior normal.        Thought Content: Thought content normal.        Judgment: Judgment normal.     Comments: Mood affect and behavior for this patient were upbeat and normal.     BP 119/70 (BP Location: Left Arm)   Pulse (!) 103   Temp 97.6 F (36.4 C) (Oral)   Ht 5' 3" (1.6 m)   Wt 130 lb (59 kg)   BMI 23.03 kg/m        Assessment & Plan:  1. Pure hypercholesterolemia -Continue with current  treatment and therapeutic lifestyle changes including diet and exercise pending results of lab work - BMP8+EGFR - CBC with Differential/Platelet - Lipid panel - Hepatic function panel - DG Chest 2 View; Future  2. Thyroid disease -Continue with current treatment pending results of lab work - CBC with Differential/Platelet - Thyroid Panel With TSH  3. Vitamin D deficiency -Continue with vitamin D and calcium replacement  pending results of lab work - CBC with Differential/Platelet - VITAMIN D 25 Hydroxy (Vit-D Deficiency, Fractures)  4. Paroxysmal atrial fibrillation (HCC) -Follow-up with Dr. Percival Spanish as planned - BMP8+EGFR - CBC with Differential/Platelet - DG Chest 2 View; Future  Meds ordered this encounter  Medications  . diltiazem (CARDIZEM CD) 180 MG 24 hr capsule    Sig: TAKE 1 CAPSULE (180 MG TOTAL) BY MOUTH DAILY.    Dispense:  90 capsule    Refill:  3    Place on file = pt does not need right now   Patient Instructions                       Medicare Annual Wellness Visit  [facility] and the medical providers at [facility] strive to bring you the best medical care.  In doing so we not only want to address your current medical conditions and concerns but also to detect new conditions early and prevent illness, disease and health-related problems.    Medicare offers a yearly Wellness Visit which allows our clinical staff to assess your need for preventative services including immunizations, lifestyle education, counseling to decrease risk of preventable diseases and screening for fall risk and other medical concerns.    This visit is provided free of charge (no copay) for all Medicare recipients. The clinical pharmacists at [facility] have begun to conduct these Wellness Visits which will also include a thorough review of all your medications.    As you primary medical provider recommend that you make an appointment for  your Annual Wellness Visit if you have not done so already this year.  You may set up this appointment before you leave today or you may call back ([phone]) and schedule an appointment.  Please make sure when you call that you mention that you are scheduling your Annual Wellness Visit with the clinical pharmacist so that the appointment may be made for the proper length of time.    Continue current medications. Continue good therapeutic lifestyle changes which include good diet and exercise. Fall precautions discussed with patient. If an FOBT was given today- please return it to our front desk. If you are over 59 years old - you may need Prevnar 79 or the adult Pneumonia vaccine.  **Flu shots are available--- please call and schedule a FLU-CLINIC appointment**  After your visit with [place] today you will receive a survey in the mail or online from [employer] regarding your care with [place]. Please take a moment to fill this out. Your feedback is very important to [place] as you can help [place] better understand your patient needs as well as improve your experience and satisfaction. WE CARE ABOUT YOU!!!   Drink plenty of fluids and stay well-hydrated Practice good hand hygiene and respiratory hygiene Avoid crowds of people as much as possible Continue to follow-up with cardiology as planned We will call with lab work results as soon as these results become available  Arrie Senate MD

## 2018-10-15 LAB — HEPATIC FUNCTION PANEL
ALT: 13 IU/L (ref 0–32)
AST: 23 IU/L (ref 0–40)
Albumin: 4.4 g/dL (ref 3.6–4.6)
Alkaline Phosphatase: 75 IU/L (ref 39–117)
Bilirubin Total: 0.5 mg/dL (ref 0.0–1.2)
Bilirubin, Direct: 0.16 mg/dL (ref 0.00–0.40)
Total Protein: 6.9 g/dL (ref 6.0–8.5)

## 2018-10-15 LAB — CBC WITH DIFFERENTIAL/PLATELET
Basophils Absolute: 0.1 10*3/uL (ref 0.0–0.2)
Basos: 1 %
EOS (ABSOLUTE): 0.1 10*3/uL (ref 0.0–0.4)
Eos: 1 %
Hematocrit: 40 % (ref 34.0–46.6)
Hemoglobin: 13.4 g/dL (ref 11.1–15.9)
Immature Grans (Abs): 0 10*3/uL (ref 0.0–0.1)
Immature Granulocytes: 0 %
Lymphocytes Absolute: 2.5 10*3/uL (ref 0.7–3.1)
Lymphs: 31 %
MCH: 31.6 pg (ref 26.6–33.0)
MCHC: 33.5 g/dL (ref 31.5–35.7)
MCV: 94 fL (ref 79–97)
MONOS ABS: 0.8 10*3/uL (ref 0.1–0.9)
Monocytes: 10 %
NEUTROS ABS: 4.8 10*3/uL (ref 1.4–7.0)
Neutrophils: 57 %
Platelets: 279 10*3/uL (ref 150–450)
RBC: 4.24 x10E6/uL (ref 3.77–5.28)
RDW: 12.5 % (ref 11.7–15.4)
WBC: 8.3 10*3/uL (ref 3.4–10.8)

## 2018-10-15 LAB — THYROID PANEL WITH TSH
Free Thyroxine Index: 2.2 (ref 1.2–4.9)
T3 Uptake Ratio: 28 % (ref 24–39)
T4, Total: 7.8 ug/dL (ref 4.5–12.0)
TSH: 2.85 u[IU]/mL (ref 0.450–4.500)

## 2018-10-15 LAB — BMP8+EGFR
BUN/Creatinine Ratio: 14 (ref 12–28)
BUN: 15 mg/dL (ref 8–27)
CO2: 24 mmol/L (ref 20–29)
CREATININE: 1.08 mg/dL — AB (ref 0.57–1.00)
Calcium: 10 mg/dL (ref 8.7–10.3)
Chloride: 101 mmol/L (ref 96–106)
GFR calc Af Amer: 54 mL/min/{1.73_m2} — ABNORMAL LOW (ref >59)
GFR calc non Af Amer: 47 mL/min/{1.73_m2} — ABNORMAL LOW (ref >59)
GLUCOSE: 81 mg/dL (ref 65–99)
Potassium: 4.1 mmol/L (ref 3.5–5.2)
SODIUM: 142 mmol/L (ref 134–144)

## 2018-10-15 LAB — LIPID PANEL
CHOL/HDL RATIO: 2.3 ratio (ref 0.0–4.4)
Cholesterol, Total: 155 mg/dL (ref 100–199)
HDL: 66 mg/dL (ref >39)
LDL Calculated: 74 mg/dL (ref 0–99)
Triglycerides: 75 mg/dL (ref 0–149)
VLDL Cholesterol Cal: 15 mg/dL (ref 5–40)

## 2018-10-15 LAB — VITAMIN D 25 HYDROXY (VIT D DEFICIENCY, FRACTURES): VIT D 25 HYDROXY: 56.5 ng/mL (ref 30.0–100.0)

## 2018-10-26 ENCOUNTER — Ambulatory Visit: Payer: Medicare Other

## 2018-10-26 DIAGNOSIS — M65341 Trigger finger, right ring finger: Secondary | ICD-10-CM | POA: Insufficient documentation

## 2018-11-04 ENCOUNTER — Other Ambulatory Visit (HOSPITAL_COMMUNITY): Payer: Self-pay | Admitting: Cardiology

## 2018-11-24 ENCOUNTER — Ambulatory Visit: Payer: Medicare Other

## 2018-11-26 ENCOUNTER — Ambulatory Visit: Payer: Medicare Other

## 2019-01-12 ENCOUNTER — Other Ambulatory Visit: Payer: Self-pay

## 2019-01-12 ENCOUNTER — Ambulatory Visit
Admission: RE | Admit: 2019-01-12 | Discharge: 2019-01-12 | Disposition: A | Discharge status: Home / Self Care | Payer: Medicare Other | Source: Ambulatory Visit | Attending: Family Medicine | Admitting: Family Medicine

## 2019-01-12 DIAGNOSIS — Z1231 Encounter for screening mammogram for malignant neoplasm of breast: Secondary | ICD-10-CM

## 2019-01-24 ENCOUNTER — Other Ambulatory Visit: Payer: Self-pay | Admitting: Family Medicine

## 2019-03-03 ENCOUNTER — Ambulatory Visit: Payer: Medicare Other | Admitting: Family Medicine

## 2019-03-08 ENCOUNTER — Other Ambulatory Visit: Payer: Self-pay

## 2019-03-09 ENCOUNTER — Encounter: Payer: Self-pay | Admitting: Family Medicine

## 2019-03-09 ENCOUNTER — Ambulatory Visit (INDEPENDENT_AMBULATORY_CARE_PROVIDER_SITE_OTHER): Payer: Medicare Other | Admitting: Family Medicine

## 2019-03-09 VITALS — BP 116/76 | HR 80 | Temp 96.9°F | Ht 63.0 in | Wt 128.0 lb

## 2019-03-09 DIAGNOSIS — N183 Chronic kidney disease, stage 3 unspecified: Secondary | ICD-10-CM

## 2019-03-09 DIAGNOSIS — I480 Paroxysmal atrial fibrillation: Secondary | ICD-10-CM

## 2019-03-09 DIAGNOSIS — Z23 Encounter for immunization: Secondary | ICD-10-CM | POA: Diagnosis not present

## 2019-03-09 DIAGNOSIS — E7800 Pure hypercholesterolemia, unspecified: Secondary | ICD-10-CM

## 2019-03-09 DIAGNOSIS — Z7901 Long term (current) use of anticoagulants: Secondary | ICD-10-CM

## 2019-03-09 MED ORDER — ATORVASTATIN CALCIUM 10 MG PO TABS: ORAL_TABLET | ORAL | 1 refills | Status: DC

## 2019-03-09 MED ORDER — RIVAROXABAN 15 MG PO TABS: 15.0000 mg | ORAL_TABLET | Freq: Every day | ORAL | 1 refills | Status: DC

## 2019-03-09 NOTE — Progress Notes (Signed)
Subjective: CC: afib PCP: Janora [name], DO [id number] F [name] is a 85 y.o. female presenting to clinic today for:  1.  Atrial fibrillation Patient with longstanding history of atrial fibrillation.  This onset about 15 years ago.  She has been controlled on diltiazem 180 mg daily as well as Xarelto 15 mg daily.  Denies any abnormal racing heart, lower extremity edema, chest pain or shortness of breath.  Certainly no melena, hematochezia, abnormal vaginal bleeding or hematuria.  2.  Hyperlipidemia Patient reports compliance with Lipitor 5 mg daily.  No abdominal pain.  No jaundice.  ROS: Per HPI  No Known Allergies Past Medical History:  Diagnosis Date  . Arthritis   . AV malformation of gastrointestinal tract    in cecum she denies history of GIB  . Breast mass, right   . Cataract   . CVA (cerebral vascular accident) ([facility]) 2007  . Dizziness    denies this as an ongoing problem  . Hyperlipidemia   . Microcytic anemia   . Osteopenia   . Persistent atrial fibrillation    s/p PVI 04/2010  CHADS2vasc at least 5  . RLS (restless legs syndrome)   . Sinus bradycardia     Current Outpatient Medications:  .  acetaminophen (TYLENOL) 500 MG tablet, Take 1-2 tablets (500-1,000 mg total) by mouth 3 (three) times daily as needed (arthritis pain)., Disp: 30 tablet, Rfl: 0 .  atorvastatin (LIPITOR) 10 MG tablet, TAKE 1/2 TABLET BY MOUTH ONCE A DAY OR AS INSTRUCTED BY PHYSICIAN, Disp: 45 tablet, Rfl: 1 .  Calcium Carb-Cholecalciferol (CALCIUM 600+D) 600-800 MG-UNIT TABS, Take by mouth daily., Disp: , Rfl:  .  chlorhexidine (PERIDEX) 0.12 % solution, Mouthwash - Use as directed, Disp: , Rfl:  .  Cholecalciferol (VITAMIN D3) 1000 UNITS CAPS, Take 1 capsule by mouth daily., Disp: , Rfl:  .  diltiazem (CARDIZEM CD) 180 MG 24 hr capsule, TAKE 1 CAPSULE (180 MG TOTAL) BY MOUTH DAILY., Disp: 90 capsule, Rfl: 3 .  folic acid (FOLVITE) 563 MCG tablet, Take 400 mcg by mouth daily.,  Disp: , Rfl:  .  ipratropium (ATROVENT) 0.06 % nasal spray, PLACE 1 SPRAY INTO EACH NOSTRIL TWICE A DAY AS NEEDED, Disp: 135 mL, Rfl: 1 .  Magnesium 500 MG TABS, Take 1 tablet by mouth daily., Disp: , Rfl:  .  Multiple Vitamin (MULTIVITAMIN) capsule, Take 1 capsule by mouth daily.  , Disp: , Rfl:  .  Rivaroxaban (XARELTO) 15 MG TABS tablet, Take 1 tablet (15 mg total) by mouth daily., Disp: 90 tablet, Rfl: 1 Social History   Socioeconomic History  . Marital status: Widowed    Spouse name: Not on file  . Number of children: 0  . Years of education: college  . Highest education level: Not on file  Occupational History  . Occupation: retired  Scientific laboratory technician  . Financial resource strain: Not hard at all  . Food insecurity    Worry: Never true    Inability: Never true  . Transportation needs    Medical: No    Non-medical: No  Tobacco Use  . Smoking status: Former Smoker    Packs/day: 1.00    Years: 30.00    Pack years: 30.00    Types: Cigarettes    Quit date: 08/05/1978    Years since quitting: 40.6  . Smokeless tobacco: Never Used  . Tobacco comment: quit in 1980s  Substance and Sexual Activity  . Alcohol use: No  . Drug use:  No  . Sexual activity: Never  Lifestyle  . Physical activity    Days per week: 4 days    Minutes per session: 60 min  . Stress: Not at all  Relationships  . Social connections    Talks on phone: More than three times a week    Gets together: More than three times a week    Attends religious service: More than 4 times per year    Active member of club or organization: No    Attends meetings of clubs or organizations: Never    Relationship status: Widowed  . Intimate partner violence    Fear of current or ex partner: No    Emotionally abused: No    Physically abused: No    Forced sexual activity: No  Other Topics Concern  . Not on file  Social History Narrative   Patient is retired and lives at home alone. Patient has business college education.     Right handed.   Caffeine- some times - soda    Family History  Problem Relation Age of Onset  . Stroke Mother 17  . Hip fracture Mother   . Lung cancer Father 75  . Anesthesia problems Neg Hx   . Hypotension Neg Hx   . Malignant hyperthermia Neg Hx   . Pseudochol deficiency Neg Hx   . Breast cancer Neg Hx     Objective: Office vital signs reviewed. BP 116/76   Pulse 80   Temp (!) 96.9 F (36.1 C) (Temporal)   Ht 5\' 3"  (1.6 m)   Wt 128 lb (58.1 kg)   BMI 22.67 kg/m   Physical Examination:  General: Awake, alert, well nourished, No acute distress HEENT: Normal, sclera white, MMM Cardio: irregularly irregular, rate controlled. S1S2 heard, no murmurs appreciated Pulm: clear to auscultation bilaterally, no wheezes, rhonchi or rales; normal work of breathing on room air Extremities: warm, well perfused, No edema, cyanosis or clubbing; +2 pulses bilaterally  Assessment/ Plan: 85 y.o. female   1. Paroxysmal atrial fibrillation (HCC) Rate controlled.  Doing well on Cardizem.  No red flag signs or symptoms concerning for abnormal bleeding on Xarelto.  Renewal of Xarelto sent.  Okay to follow-up in 6 months, sooner if needed - Rivaroxaban (XARELTO) 15 MG TABS tablet; Take 1 tablet (15 mg total) by mouth daily.  Dispense: 90 tablet; Refill: 1  2. Pure hypercholesterolemia Stable with 5 mg of Lipitor daily.  Refill sent - atorvastatin (LIPITOR) 10 MG tablet; TAKE 1/2 TABLET BY MOUTH ONCE A DAY OR AS INSTRUCTED BY PHYSICIAN  Dispense: 45 tablet; Refill: 1  3. Chronic anticoagulation As above - Basic Metabolic Panel - CBC  4. CKD (chronic kidney disease) stage 3, GFR 30-59 ml/min (HCC) Check renal function.  I suspect that she has CKD 3 given renal impairment on last several BMPs. - Basic Metabolic Panel   Orders Placed This Encounter  Procedures  . Basic Metabolic Panel  . CBC   Meds ordered this encounter  Medications  . Rivaroxaban (XARELTO) 15 MG TABS tablet     Sig: Take 1 tablet (15 mg total) by mouth daily.    Dispense:  90 tablet    Refill:  1  . atorvastatin (LIPITOR) 10 MG tablet    Sig: TAKE 1/2 TABLET BY MOUTH ONCE A DAY OR AS INSTRUCTED BY PHYSICIAN    Dispense:  45 tablet    Refill:  [place], DO [facility] (  [phone]   

## 2019-03-09 NOTE — Addendum Note (Signed)
Addended by: Nigel Berthold C on: 03/09/2019 03:24 PM   Modules accepted: Orders

## 2019-03-09 NOTE — Patient Instructions (Signed)
You had labs performed today.  You will be contacted with the results of the labs once they are available, usually in the next 3 business days for routine lab work.  If you have an active my chart account, they will be released to your MyChart.  If you prefer to have these labs released to you via telephone, please let us know.  If you had a pap smear or biopsy performed, expect to be contacted in about 7-10 days.  Schedule a visit with the nurse for your flu shot starting in October.

## 2019-03-10 LAB — BASIC METABOLIC PANEL
BUN/Creatinine Ratio: 11 — ABNORMAL LOW (ref 12–28)
BUN: 12 mg/dL (ref 8–27)
CO2: 25 mmol/L (ref 20–29)
Calcium: 9.8 mg/dL (ref 8.7–10.3)
Chloride: 102 mmol/L (ref 96–106)
Creatinine, Ser: 1.08 mg/dL — ABNORMAL HIGH (ref 0.57–1.00)
GFR calc Af Amer: 54 mL/min/{1.73_m2} — ABNORMAL LOW (ref >59)
GFR calc non Af Amer: 47 mL/min/{1.73_m2} — ABNORMAL LOW (ref >59)
Glucose: 82 mg/dL (ref 65–99)
Potassium: 4.6 mmol/L (ref 3.5–5.2)
Sodium: 140 mmol/L (ref 134–144)

## 2019-03-10 LAB — CBC
Hematocrit: 38 % (ref 34.0–46.6)
Hemoglobin: 12.9 g/dL (ref 11.1–15.9)
MCH: 31.5 pg (ref 26.6–33.0)
MCHC: 33.9 g/dL (ref 31.5–35.7)
MCV: 93 fL (ref 79–97)
Platelets: 285 10*3/uL (ref 150–450)
RBC: 4.09 x10E6/uL (ref 3.77–5.28)
RDW: 12.4 % (ref 11.7–15.4)
WBC: 9.9 10*3/uL (ref 3.4–10.8)

## 2019-03-11 ENCOUNTER — Telehealth: Payer: Self-pay | Admitting: Family Medicine

## 2019-03-11 NOTE — Telephone Encounter (Signed)
Aware of results. 

## 2019-04-20 ENCOUNTER — Encounter: Payer: Medicare Other | Admitting: *Deleted

## 2019-08-09 DIAGNOSIS — Z7189 Other specified counseling: Secondary | ICD-10-CM | POA: Insufficient documentation

## 2019-08-09 NOTE — Progress Notes (Signed)
Cardiology Office Note   Date:  08/11/2019   ID:  [name], DOB 1931-01-31, MRN [id number]  PCP:  Janora [name], DO  Cardiologist:   Minus Breeding, MD   Chief Complaint  Patient presents with  . Atrial Fibrillation      History of Present Illness: [name] is a 86 y.o. female who presents for follow up of atrial fib.   She has chronic atrial fib. Since I last saw her she is doing well. She still walking for exercise although she cannot line dance because of the virus. The patient denies any new symptoms such as chest discomfort, neck or arm discomfort. There has been no new shortness of breath, PND or orthopnea. There have been no reported palpitations, presyncope or syncope. She does not notice her fibrillation.   Past Medical History:  Diagnosis Date  . Arthritis   . AV malformation of gastrointestinal tract    in cecum she denies history of GIB  . Breast mass, right   . Cataract   . CVA (cerebral vascular accident) ([place]) 2007  . Dizziness    denies this as an ongoing problem  . Hyperlipidemia   . Microcytic anemia   . Osteopenia   . Persistent atrial fibrillation ([place])    s/p PVI 04/2010  CHADS2vasc at least 5  . RLS (restless legs syndrome)   . Sinus bradycardia     Past Surgical History:  Procedure Laterality Date  . afib ablation  04/2010   PVI with CTI ablation performed by [facility]  . BREAST EXCISIONAL BIOPSY Right    benign cyst  . breast mass resected    . CARDIOVERSION     for afib  . CATARACT EXTRACTION W/PHACO  11/11/2011   Procedure: CATARACT EXTRACTION PHACO AND INTRAOCULAR LENS PLACEMENT (IOC);  Surgeon: Tonny Branch, MD;  Location: [facility];  Service: Ophthalmology;  Laterality: Left;  CDE:12.62  . CATARACT EXTRACTION W/PHACO  11/25/2011   Procedure: CATARACT EXTRACTION PHACO AND INTRAOCULAR LENS PLACEMENT (IOC);  Surgeon: Tonny Branch, MD;  Location: [facility];  Service: Ophthalmology;  Laterality: Right;  CDE 16.14  . dental  inplants       Current Outpatient Medications  Medication Sig Dispense Refill  . acetaminophen (TYLENOL) 500 MG tablet Take 1-2 tablets (500-1,000 mg total) by mouth 3 (three) times daily as needed (arthritis pain). 30 tablet 0  . Ascorbic Acid (VITAMIN C) 500 MG CAPS Take 500 mg by mouth daily.    Marland Kitchen atorvastatin (LIPITOR) 10 MG tablet TAKE 1/2 TABLET BY MOUTH ONCE A DAY OR AS INSTRUCTED BY PHYSICIAN 45 tablet 1  . Calcium Carb-Cholecalciferol (CALCIUM 600+D) 600-800 MG-UNIT TABS Take by mouth daily.    . chlorhexidine (PERIDEX) 0.12 % solution Mouthwash - Use as directed    . Cholecalciferol (VITAMIN D3) 1000 UNITS CAPS Take 1 capsule by mouth daily.    Marland Kitchen diltiazem (CARDIZEM CD) 180 MG 24 hr capsule TAKE 1 CAPSULE (180 MG TOTAL) BY MOUTH DAILY. 90 capsule 3  . folic acid (FOLVITE) Q000111Q MCG tablet Take 400 mcg by mouth daily.    Marland Kitchen ipratropium (ATROVENT) 0.06 % nasal spray PLACE 1 SPRAY INTO EACH NOSTRIL TWICE A DAY AS NEEDED 135 mL 1  . Magnesium 500 MG TABS Take 1 tablet by mouth daily.    . Multiple Vitamin (MULTIVITAMIN) capsule Take 1 capsule by mouth daily.      . Rivaroxaban (XARELTO) 15 MG TABS tablet Take 1 tablet (15 mg total)  by mouth daily. 90 tablet 1   No current facility-administered medications for this visit.    Allergies:   Patient has no known allergies.    ROS:  Please see the history of present illness.   Otherwise, review of systems are positive for none.   All other systems are reviewed and negative.    PHYSICAL EXAM: VS:  BP 118/70   Pulse 90   Ht 5\' 3"  (1.6 m)   Wt 125 lb (56.7 kg)   BMI 22.14 kg/m  , BMI Body mass index is 22.14 kg/m. GENERAL:  Well appearing NECK:  No jugular venous distention, waveform within normal limits, carotid upstroke brisk and symmetric, no bruits, no thyromegaly LUNGS:  Clear to auscultation bilaterally CHEST:  Unremarkable HEART:  PMI not displaced or sustained,S1 and S2 within normal limits, no S3, no clicks, no rubs, no  murmurs, irregular ABD:  Flat, positive bowel sounds normal in frequency in pitch, no bruits, no rebound, no guarding, no midline pulsatile mass, no hepatomegaly, no splenomegaly EXT:  2 plus pulses throughout, no edema, no cyanosis no clubbing   EKG:  EKG is  ordered today. The ekg ordered today demonstrates atrial fibrillation, rate 90, axis within normal limits, intervals within normal limits, poor anterior R wave progression, no acute ST-T wave changes.   Recent Labs: 10/14/2018: ALT 13; TSH 2.850 03/09/2019: BUN 12; Creatinine, Ser 1.08; Hemoglobin 12.9; Platelets 285; Potassium 4.6; Sodium 140    Lipid Panel    Component Value Date/Time   CHOL 155 10/14/2018 1032   CHOL 140 07/02/2013 1013   TRIG 75 10/14/2018 1032   TRIG 73 04/19/2016 0815   TRIG 71 07/02/2013 1013   HDL 66 10/14/2018 1032   HDL 62 04/19/2016 0815   HDL 61 07/02/2013 1013   CHOLHDL 2.3 10/14/2018 1032   LDLCALC 74 10/14/2018 1032   LDLCALC 76 01/17/2014 0914   LDLCALC 65 07/02/2013 1013      Wt Readings from Last 3 Encounters:  08/11/19 125 lb (56.7 kg)  03/09/19 128 lb (58.1 kg)  10/14/18 130 lb (59 kg)      Other studies Reviewed: Additional studies/ records that were reviewed today include: labs Review of the above records demonstrates:  See elsewhere   ASSESSMENT AND PLAN:  Permanent atrial fibrillation:    Ms. [name] has a CHA2DS2 - VASc score of 5. She tolerates anticoagulation. She is on a lower dose because of a borderline GFR in the past and because of previous AVMs and bleeding. No change in therapy.  Covid 19 education: She is anxious to get the vaccine when available.  Current medicines are reviewed at length with the patient today.  The patient does not have concerns regarding medicines.  The following changes have been made:  None  Labs/ tests ordered today include:   Orders Placed This Encounter  Procedures  . EKG 12-Lead     Disposition:   FU with me  in 12 months.     Signed, Minus Breeding, MD  08/11/2019 2:37 PM    [facility]

## 2019-08-11 ENCOUNTER — Ambulatory Visit: Payer: Medicare Other | Admitting: Cardiology

## 2019-08-11 ENCOUNTER — Encounter: Payer: Self-pay | Admitting: Cardiology

## 2019-08-11 ENCOUNTER — Other Ambulatory Visit: Payer: Self-pay

## 2019-08-11 VITALS — BP 118/70 | HR 90 | Ht 63.0 in | Wt 125.0 lb

## 2019-08-11 DIAGNOSIS — I4821 Permanent atrial fibrillation: Secondary | ICD-10-CM | POA: Diagnosis not present

## 2019-08-11 DIAGNOSIS — Z7189 Other specified counseling: Secondary | ICD-10-CM

## 2019-08-11 NOTE — Patient Instructions (Signed)
Medication Instructions:  The current medical regimen is effective;  continue present plan and medications.  *If you need a refill on your cardiac medications before your next appointment, please call your pharmacy*  Follow-Up: At [facility], you and your health needs are our priority.  As part of our continuing mission to provide you with exceptional heart care, we have created designated Provider Care Teams.  These Care Teams include your primary Cardiologist (physician) and Advanced Practice Providers (APPs -  Physician Assistants and Nurse Practitioners) who all work together to provide you with the care you need, when you need it.  Your next appointment:   12 month(s)  The format for your next appointment:   In Person  Provider:   James Hochrein, MD  Thank you for choosing [facility]!!     

## 2019-09-06 ENCOUNTER — Other Ambulatory Visit: Payer: Self-pay | Admitting: Family Medicine

## 2019-09-06 DIAGNOSIS — I480 Paroxysmal atrial fibrillation: Secondary | ICD-10-CM

## 2019-09-08 ENCOUNTER — Other Ambulatory Visit: Payer: Self-pay

## 2019-09-09 ENCOUNTER — Ambulatory Visit (INDEPENDENT_AMBULATORY_CARE_PROVIDER_SITE_OTHER): Payer: Medicare PPO | Admitting: Family Medicine

## 2019-09-09 ENCOUNTER — Encounter: Payer: Self-pay | Admitting: Family Medicine

## 2019-09-09 VITALS — BP 120/79 | HR 85 | Temp 96.9°F | Wt 123.0 lb

## 2019-09-09 DIAGNOSIS — I480 Paroxysmal atrial fibrillation: Secondary | ICD-10-CM | POA: Diagnosis not present

## 2019-09-09 DIAGNOSIS — Z7901 Long term (current) use of anticoagulants: Secondary | ICD-10-CM | POA: Diagnosis not present

## 2019-09-09 DIAGNOSIS — M8580 Other specified disorders of bone density and structure, unspecified site: Secondary | ICD-10-CM

## 2019-09-09 DIAGNOSIS — N1831 Chronic kidney disease, stage 3a: Secondary | ICD-10-CM | POA: Diagnosis not present

## 2019-09-09 DIAGNOSIS — E7800 Pure hypercholesterolemia, unspecified: Secondary | ICD-10-CM

## 2019-09-09 MED ORDER — ATORVASTATIN CALCIUM 10 MG PO TABS: ORAL_TABLET | ORAL | 3 refills | Status: DC

## 2019-09-09 MED ORDER — DILTIAZEM HCL ER COATED BEADS 180 MG PO CP24: ORAL_CAPSULE | ORAL | 3 refills | Status: DC

## 2019-09-09 NOTE — Progress Notes (Signed)
Subjective: CC: afib PCP: Janora [name], DO [name] is a 86 y.o. female presenting to clinic today for:  1.  Atrial fibrillation on chronic anticoagulation with Xarelto Patient with longstanding history of atrial fibrillation.  This onset about 15 years ago.    She is compliant w/ 180 mg daily as well as Xarelto 15 mg daily.  She recently saw her cardiologist who told her she can come back in 1 year for follow-up.  She had a good checkup.  Denies any heart palpitations, change in exercise tolerance, shortness of breath, edema, lightheadedness, falls, hematochezia, melena, hematuria or vaginal bleeding.  2.  Hyperlipidemia Patient reports compliance with Lipitor 5 mg daily.  No chest pain, shortness of breath; tries maintain healthy diet.  3. Osteopenia Takes over-the-counter vitamin D and calcium.  Tries to stay physically active with dancing.  ROS: Per HPI  No Known Allergies Past Medical History:  Diagnosis Date  . Arthritis   . AV malformation of gastrointestinal tract    in cecum she denies history of GIB  . Breast mass, right   . Cataract   . CVA (cerebral vascular accident) ([place]) 2007  . Dizziness    denies this as an ongoing problem  . Hyperlipidemia   . Microcytic anemia   . Osteopenia   . Persistent atrial fibrillation ([place])    s/p PVI 04/2010  CHADS2vasc at least 5  . RLS (restless legs syndrome)   . Sinus bradycardia     Current Outpatient Medications:  .  acetaminophen (TYLENOL) 500 MG tablet, Take 1-2 tablets (500-1,000 mg total) by mouth 3 (three) times daily as needed (arthritis pain)., Disp: 30 tablet, Rfl: 0 .  Ascorbic Acid (VITAMIN C) 500 MG CAPS, Take 500 mg by mouth daily., Disp: , Rfl:  .  atorvastatin (LIPITOR) 10 MG tablet, TAKE 1/2 TABLET BY MOUTH ONCE A DAY OR AS INSTRUCTED BY PHYSICIAN, Disp: 45 tablet, Rfl: 1 .  Calcium Carb-Cholecalciferol (CALCIUM 600+D) 600-800 MG-UNIT TABS, Take by mouth daily., Disp: , Rfl:  .   chlorhexidine (PERIDEX) 0.12 % solution, Mouthwash - Use as directed, Disp: , Rfl:  .  Cholecalciferol (VITAMIN D3) 1000 UNITS CAPS, Take 1 capsule by mouth daily., Disp: , Rfl:  .  diltiazem (CARDIZEM CD) 180 MG 24 hr capsule, TAKE 1 CAPSULE (180 MG TOTAL) BY MOUTH DAILY., Disp: 90 capsule, Rfl: 3 .  folic acid (FOLVITE) 706 MCG tablet, Take 400 mcg by mouth daily., Disp: , Rfl:  .  ipratropium (ATROVENT) 0.06 % nasal spray, PLACE 1 SPRAY INTO EACH NOSTRIL TWICE A DAY AS NEEDED, Disp: 135 mL, Rfl: 1 .  Magnesium 500 MG TABS, Take 1 tablet by mouth daily., Disp: , Rfl:  .  Multiple Vitamin (MULTIVITAMIN) capsule, Take 1 capsule by mouth daily.  , Disp: , Rfl:  .  XARELTO 15 MG TABS tablet, TAKE 1 TABLET BY MOUTH EVERY DAY, Disp: 90 tablet, Rfl: 1 Social History   Socioeconomic History  . Marital status: Widowed    Spouse name: Not on file  . Number of children: 0  . Years of education: college  . Highest education level: Not on file  Occupational History  . Occupation: retired  Tobacco Use  . Smoking status: Former Smoker    Packs/day: 1.00    Years: 30.00    Pack years: 30.00    Types: Cigarettes    Quit date: 08/05/1978    Years since quitting: 41.1  . Smokeless tobacco: Never Used  .  Tobacco comment: quit in 1980s  Substance and Sexual Activity  . Alcohol use: No  . Drug use: No  . Sexual activity: Never  Other Topics Concern  . Not on file  Social History Narrative   Patient is retired and lives at home alone. Patient has business college education.    Right handed.   Caffeine- some times - soda    Social Determinants of Health   Financial Resource Strain:   . Difficulty of Paying Living Expenses: Not on file  Food Insecurity:   . Worried About Charity fundraiser in the Last Year: Not on file  . Ran Out of Food in the Last Year: Not on file  Transportation Needs:   . Lack of Transportation (Medical): Not on file  . Lack of Transportation (Non-Medical): Not on file   Physical Activity:   . Days of Exercise per Week: Not on file  . Minutes of Exercise per Session: Not on file  Stress:   . Feeling of Stress : Not on file  Social Connections:   . Frequency of Communication with Friends and Family: Not on file  . Frequency of Social Gatherings with Friends and Family: Not on file  . Attends Religious Services: Not on file  . Active Member of Clubs or Organizations: Not on file  . Attends Archivist Meetings: Not on file  . Marital Status: Not on file  Intimate Partner Violence:   . Fear of Current or Ex-Partner: Not on file  . Emotionally Abused: Not on file  . Physically Abused: Not on file  . Sexually Abused: Not on file   Family History  Problem Relation Age of Onset  . Stroke Mother 48  . Hip fracture Mother   . Lung cancer Father 59  . Anesthesia problems Neg Hx   . Hypotension Neg Hx   . Malignant hyperthermia Neg Hx   . Pseudochol deficiency Neg Hx   . Breast cancer Neg Hx     Objective: Office vital signs reviewed. BP 120/79   Pulse 85   Temp (!) 96.9 F (36.1 C)   Wt 123 lb (55.8 kg)   SpO2 97%   BMI 21.79 kg/m   Physical Examination:  General: Awake, alert, well nourished, well appearing female. No acute distress HEENT: Normal, sclera white, MMM Cardio: irregularly irregular, rate controlled. S1S2 heard, no murmurs appreciated Pulm: clear to auscultation bilaterally, no wheezes, rhonchi or rales; normal work of breathing on room air Extremities: warm, well perfused, No edema, cyanosis or clubbing; +2 pulses bilaterally  Assessment/ Plan: 86 y.o. female   1. Paroxysmal atrial fibrillation (HCC) Rate controlled.  Asymptomatic - CBC - TSH  2. Chronic anticoagulation No red flags for signs of bleeding.  Xarelto is renally dosed and she has been stable on this - CBC  3. Pure hypercholesterolemia Check fasting lipid panel - Lipid Panel - TSH - atorvastatin (LIPITOR) 10 MG tablet; TAKE 1/2 TABLET BY MOUTH  ONCE A DAY OR AS INSTRUCTED BY PHYSICIAN  Dispense: 45 tablet; Refill: 3  4. Stage 3a chronic kidney disease Check renal function - CMP14+EGFR  5. Low bone mass Check vitamin D, TSH and calcium.  Plan for repeat bone density scan in November - CMP14+EGFR - TSH - VITAMIN D 25 Hydroxy (Vit-D Deficiency, Fractures)  Patient to follow-up in 6 months for interval checkup   No orders of the defined types were placed in this encounter.  Meds ordered this encounter  Medications  .  atorvastatin (LIPITOR) 10 MG tablet    Sig: TAKE 1/2 TABLET BY MOUTH ONCE A DAY OR AS INSTRUCTED BY PHYSICIAN    Dispense:  45 tablet    Refill:  3  . diltiazem (CARDIZEM CD) 180 MG 24 hr capsule    Sig: TAKE 1 CAPSULE (180 MG TOTAL) BY MOUTH DAILY.    Dispense:  90 capsule    Refill:  3    Place on file = pt does not need right now     Janora [name], DO [place] [phone]

## 2019-09-09 NOTE — Patient Instructions (Signed)
Your next bone density scan will be due in November.  Please feel free to schedule this at your convenience.  You had labs performed today.  You will be contacted with the results of the labs once they are available, usually in the next 3 business days for routine lab work.  If you have an active my chart account, they will be released to your MyChart.  If you prefer to have these labs released to you via telephone, please let us know.  If you had a pap smear or biopsy performed, expect to be contacted in about 7-10 days.

## 2019-09-10 LAB — LIPID PANEL
Chol/HDL Ratio: 2.4 ratio (ref 0.0–4.4)
Cholesterol, Total: 160 mg/dL (ref 100–199)
HDL: 67 mg/dL (ref >39)
LDL Chol Calc (NIH): 78 mg/dL (ref 0–99)
Triglycerides: 79 mg/dL (ref 0–149)
VLDL Cholesterol Cal: 15 mg/dL (ref 5–40)

## 2019-09-10 LAB — TSH: TSH: 4.92 u[IU]/mL — ABNORMAL HIGH (ref 0.450–4.500)

## 2019-09-10 LAB — CBC
Hematocrit: 38.5 % (ref 34.0–46.6)
Hemoglobin: 13.6 g/dL (ref 11.1–15.9)
MCH: 32.4 pg (ref 26.6–33.0)
MCHC: 35.3 g/dL (ref 31.5–35.7)
MCV: 92 fL (ref 79–97)
Platelets: 290 10*3/uL (ref 150–450)
RBC: 4.2 x10E6/uL (ref 3.77–5.28)
RDW: 12.4 % (ref 11.7–15.4)
WBC: 7.5 10*3/uL (ref 3.4–10.8)

## 2019-09-10 LAB — CMP14+EGFR
ALT: 12 IU/L (ref 0–32)
AST: 21 IU/L (ref 0–40)
Albumin/Globulin Ratio: 1.6 (ref 1.2–2.2)
Albumin: 4.3 g/dL (ref 3.6–4.6)
Alkaline Phosphatase: 76 IU/L (ref 39–117)
BUN/Creatinine Ratio: 12 (ref 12–28)
BUN: 11 mg/dL (ref 8–27)
Bilirubin Total: 0.5 mg/dL (ref 0.0–1.2)
CO2: 24 mmol/L (ref 20–29)
Calcium: 10 mg/dL (ref 8.7–10.3)
Chloride: 103 mmol/L (ref 96–106)
Creatinine, Ser: 0.92 mg/dL (ref 0.57–1.00)
GFR calc Af Amer: 65 mL/min/{1.73_m2} (ref >59)
GFR calc non Af Amer: 57 mL/min/{1.73_m2} — ABNORMAL LOW (ref >59)
Globulin, Total: 2.7 g/dL (ref 1.5–4.5)
Glucose: 81 mg/dL (ref 65–99)
Potassium: 4.6 mmol/L (ref 3.5–5.2)
Sodium: 142 mmol/L (ref 134–144)
Total Protein: 7 g/dL (ref 6.0–8.5)

## 2019-09-10 LAB — VITAMIN D 25 HYDROXY (VIT D DEFICIENCY, FRACTURES): Vit D, 25-Hydroxy: 54 ng/mL (ref 30.0–100.0)

## 2019-09-13 ENCOUNTER — Telehealth: Payer: Self-pay | Admitting: Family Medicine

## 2019-09-13 NOTE — Telephone Encounter (Signed)
Patient aware of lab results.

## 2019-12-23 DIAGNOSIS — M79671 Pain in right foot: Secondary | ICD-10-CM | POA: Diagnosis not present

## 2019-12-23 DIAGNOSIS — M2041 Other hammer toe(s) (acquired), right foot: Secondary | ICD-10-CM | POA: Diagnosis not present

## 2019-12-30 ENCOUNTER — Other Ambulatory Visit: Payer: Self-pay | Admitting: Family Medicine

## 2019-12-30 DIAGNOSIS — Z1231 Encounter for screening mammogram for malignant neoplasm of breast: Secondary | ICD-10-CM

## 2020-01-18 ENCOUNTER — Ambulatory Visit: Payer: Medicare PPO

## 2020-01-25 ENCOUNTER — Ambulatory Visit: Payer: Medicare PPO

## 2020-02-01 ENCOUNTER — Other Ambulatory Visit: Payer: Self-pay

## 2020-02-01 ENCOUNTER — Ambulatory Visit
Admission: RE | Admit: 2020-02-01 | Discharge: 2020-02-01 | Disposition: A | Discharge status: Home / Self Care | Payer: Medicare PPO | Source: Ambulatory Visit | Attending: Family Medicine | Admitting: Family Medicine

## 2020-02-01 DIAGNOSIS — Z1231 Encounter for screening mammogram for malignant neoplasm of breast: Secondary | ICD-10-CM

## 2020-03-08 ENCOUNTER — Encounter: Payer: Self-pay | Admitting: Family Medicine

## 2020-03-08 ENCOUNTER — Other Ambulatory Visit: Payer: Self-pay

## 2020-03-08 ENCOUNTER — Ambulatory Visit: Payer: Medicare PPO | Admitting: Family Medicine

## 2020-03-08 VITALS — BP 121/80 | HR 78 | Temp 97.4°F | Ht 63.0 in | Wt 120.0 lb

## 2020-03-08 DIAGNOSIS — I480 Paroxysmal atrial fibrillation: Secondary | ICD-10-CM | POA: Diagnosis not present

## 2020-03-08 DIAGNOSIS — N1831 Chronic kidney disease, stage 3a: Secondary | ICD-10-CM

## 2020-03-08 DIAGNOSIS — R7989 Other specified abnormal findings of blood chemistry: Secondary | ICD-10-CM

## 2020-03-08 LAB — HEMOGLOBIN, FINGERSTICK: Hemoglobin: 12.5 g/dL (ref 11.1–15.9)

## 2020-03-08 MED ORDER — RIVAROXABAN 15 MG PO TABS: 15.0000 mg | ORAL_TABLET | Freq: Every day | ORAL | 1 refills | Status: DC

## 2020-03-08 NOTE — Progress Notes (Signed)
Subjective: CC: afib PCP: Janora [name], DO HYQ:MVHQION F [name] is a 86 y.o. female presenting to clinic today for:  1.  Atrial fibrillation on chronic anticoagulation with Xarelto; CKD3 Patient with longstanding history of atrial fibrillation.  This onset about 15 years ago.    She is compliant w/ 180 mg daily and Xarelto 15 mg daily.  She has been in good health since our last visit.  No heart palpitations, no change in physical exercise activity, no chest pain, shortness of breath, hematochezia, melena, epistaxis or vaginal bleeding.  She continues to exercise at least twice weekly.  ROS: Per HPI  No Known Allergies Past Medical History:  Diagnosis Date  . Arthritis   . AV malformation of gastrointestinal tract    in cecum she denies history of GIB  . Breast mass, right   . Cataract   . CVA (cerebral vascular accident) ([place]) 2007  . Dizziness    denies this as an ongoing problem  . Hyperlipidemia   . Microcytic anemia   . Osteopenia   . Persistent atrial fibrillation ([place])    s/p PVI 04/2010  CHADS2vasc at least 5  . RLS (restless legs syndrome)   . Sinus bradycardia     Current Outpatient Medications:  .  acetaminophen (TYLENOL) 500 MG tablet, Take 1-2 tablets (500-1,000 mg total) by mouth 3 (three) times daily as needed (arthritis pain)., Disp: 30 tablet, Rfl: 0 .  Ascorbic Acid (VITAMIN C) 500 MG CAPS, Take 500 mg by mouth daily., Disp: , Rfl:  .  atorvastatin (LIPITOR) 10 MG tablet, TAKE 1/2 TABLET BY MOUTH ONCE A DAY OR AS INSTRUCTED BY PHYSICIAN, Disp: 45 tablet, Rfl: 3 .  Calcium Carb-Cholecalciferol (CALCIUM 600+D) 600-800 MG-UNIT TABS, Take by mouth daily., Disp: , Rfl:  .  chlorhexidine (PERIDEX) 0.12 % solution, Mouthwash - Use as directed, Disp: , Rfl:  .  Cholecalciferol (VITAMIN D3) 1000 UNITS CAPS, Take 1 capsule by mouth daily., Disp: , Rfl:  .  diltiazem (CARDIZEM CD) 180 MG 24 hr capsule, TAKE 1 CAPSULE (180 MG TOTAL) BY MOUTH DAILY., Disp:  90 capsule, Rfl: 3 .  folic acid (FOLVITE) 629 MCG tablet, Take 400 mcg by mouth daily., Disp: , Rfl:  .  ipratropium (ATROVENT) 0.06 % nasal spray, PLACE 1 SPRAY INTO EACH NOSTRIL TWICE A DAY AS NEEDED, Disp: 135 mL, Rfl: 1 .  Magnesium 500 MG TABS, Take 1 tablet by mouth daily., Disp: , Rfl:  .  Multiple Vitamin (MULTIVITAMIN) capsule, Take 1 capsule by mouth daily.  , Disp: , Rfl:  .  XARELTO 15 MG TABS tablet, TAKE 1 TABLET BY MOUTH EVERY DAY, Disp: 90 tablet, Rfl: 1 Social History   Socioeconomic History  . Marital status: Widowed    Spouse name: Not on file  . Number of children: 0  . Years of education: college  . Highest education level: Not on file  Occupational History  . Occupation: retired  Tobacco Use  . Smoking status: Former Smoker    Packs/day: 1.00    Years: 30.00    Pack years: 30.00    Types: Cigarettes    Quit date: 08/05/1978    Years since quitting: 41.6  . Smokeless tobacco: Never Used  . Tobacco comment: quit in 1980s  Vaping Use  . Vaping Use: Never used  Substance and Sexual Activity  . Alcohol use: No  . Drug use: No  . Sexual activity: Never  Other Topics Concern  . Not on  file  Social History Narrative   Patient is retired and lives at home alone. Patient has business college education.    Right handed.   Caffeine- some times - soda    Social Determinants of Health   Financial Resource Strain:   . Difficulty of Paying Living Expenses:   Food Insecurity:   . Worried About Charity fundraiser in the Last Year:   . Arboriculturist in the Last Year:   Transportation Needs:   . Film/video editor (Medical):   Marland Kitchen Lack of Transportation (Non-Medical):   Physical Activity:   . Days of Exercise per Week:   . Minutes of Exercise per Session:   Stress:   . Feeling of Stress :   Social Connections:   . Frequency of Communication with Friends and Family:   . Frequency of Social Gatherings with Friends and Family:   . Attends Religious  Services:   . Active Member of Clubs or Organizations:   . Attends Archivist Meetings:   Marland Kitchen Marital Status:   Intimate Partner Violence:   . Fear of Current or Ex-Partner:   . Emotionally Abused:   Marland Kitchen Physically Abused:   . Sexually Abused:    Family History  Problem Relation Age of Onset  . Stroke Mother 24  . Hip fracture Mother   . Lung cancer Father 84  . Anesthesia problems Neg Hx   . Hypotension Neg Hx   . Malignant hyperthermia Neg Hx   . Pseudochol deficiency Neg Hx   . Breast cancer Neg Hx     Objective: Office vital signs reviewed. BP 121/80   Pulse 78   Temp (!) 97.4 F (36.3 C)   Ht 5\' 3"  (1.6 m)   Wt 120 lb (54.4 kg)   SpO2 92%   BMI 21.26 kg/m   Physical Examination:  General: Awake, alert, well nourished, well appearing female. No acute distress HEENT: Normal, sclera white, MMM Cardio: irregularly irregular, rate controlled. S1S2 heard, no murmurs appreciated Pulm: clear to auscultation bilaterally, no wheezes, rhonchi or rales; normal work of breathing on room air Extremities: warm, well perfused, No edema, cyanosis or clubbing; +2 pulses bilaterally  Assessment/ Plan: 86 y.o. female   1. Paroxysmal atrial fibrillation (HCC) Rate controlled.  No red flags.  Xarelto renewed.  Check hemoglobin - Hemoglobin, fingerstick - Rivaroxaban (XARELTO) 15 MG TABS tablet; Take 1 tablet (15 mg total) by mouth daily.  Dispense: 90 tablet; Refill: 1  2. Elevated TSH Slightly elevated last visit.  We will recheck this is a full panel - Thyroid Panel With TSH  3. Stage 3a chronic kidney disease Last GFR was 57 mL/min.  Check renal function - Hemoglobin, fingerstick - Basic Metabolic Panel   Orders Placed This Encounter  Procedures  . Thyroid Panel With TSH  . Renal Function Panel   No orders of the defined types were placed in this encounter.    Janora [name], DO [place] [phone]

## 2020-03-08 NOTE — Patient Instructions (Signed)
Next labs will be fasting.  You had labs performed today.  You will be contacted with the results of the labs once they are available, usually in the next 3 business days for routine lab work.  If you have an active my chart account, they will be released to your MyChart.  If you prefer to have these labs released to you via telephone, please let us know.  If you had a pap smear or biopsy performed, expect to be contacted in about 7-10 days.

## 2020-03-09 LAB — BASIC METABOLIC PANEL
BUN/Creatinine Ratio: 13 (ref 12–28)
BUN: 14 mg/dL (ref 8–27)
CO2: 25 mmol/L (ref 20–29)
Calcium: 9.7 mg/dL (ref 8.7–10.3)
Chloride: 103 mmol/L (ref 96–106)
Creatinine, Ser: 1.06 mg/dL — ABNORMAL HIGH (ref 0.57–1.00)
GFR calc Af Amer: 55 mL/min/{1.73_m2} — ABNORMAL LOW (ref >59)
GFR calc non Af Amer: 47 mL/min/{1.73_m2} — ABNORMAL LOW (ref >59)
Glucose: 79 mg/dL (ref 65–99)
Potassium: 4.8 mmol/L (ref 3.5–5.2)
Sodium: 141 mmol/L (ref 134–144)

## 2020-03-09 LAB — THYROID PANEL WITH TSH
Free Thyroxine Index: 2.2 (ref 1.2–4.9)
T3 Uptake Ratio: 28 % (ref 24–39)
T4, Total: 7.8 ug/dL (ref 4.5–12.0)
TSH: 4.24 u[IU]/mL (ref 0.450–4.500)

## 2020-05-25 NOTE — Progress Notes (Signed)
Cardiology Office Note   Date:  05/26/2020   ID:  [name], DOB 1930-08-24, MRN [id number]  PCP:  Janora [name], DO  Cardiologist:   Minus Breeding, MD   No chief complaint on file.     History of Present Illness: [name] is a 86 y.o. female who presents for follow up of atrial fib.   She has chronic atrial fib. Since I last saw her she had been doing well.  The patient denies any new symptoms such as chest discomfort, neck or arm discomfort. There has been no new shortness of breath, PND or orthopnea. There have been no reported palpitations, presyncope or syncope.  Past Medical History:  Diagnosis Date  . Arthritis   . AV malformation of gastrointestinal tract    in cecum she denies history of GIB  . Breast mass, right   . Cataract   . CVA (cerebral vascular accident) ([place]) 2007  . Dizziness    denies this as an ongoing problem  . Hyperlipidemia   . Microcytic anemia   . Osteopenia   . Persistent atrial fibrillation ([place])    s/p PVI 04/2010  CHADS2vasc at least 5  . RLS (restless legs syndrome)   . Sinus bradycardia     Past Surgical History:  Procedure Laterality Date  . afib ablation  04/2010   PVI with CTI ablation performed by [facility]  . BREAST EXCISIONAL BIOPSY Right    benign cyst  . breast mass resected    . CARDIOVERSION     for afib  . CATARACT EXTRACTION W/PHACO  11/11/2011   Procedure: CATARACT EXTRACTION PHACO AND INTRAOCULAR LENS PLACEMENT (IOC);  Surgeon: Tonny Branch, MD;  Location: [facility];  Service: Ophthalmology;  Laterality: Left;  CDE:12.62  . CATARACT EXTRACTION W/PHACO  11/25/2011   Procedure: CATARACT EXTRACTION PHACO AND INTRAOCULAR LENS PLACEMENT (IOC);  Surgeon: Tonny Branch, MD;  Location: [facility];  Service: Ophthalmology;  Laterality: Right;  CDE 16.14  . dental inplants       Current Outpatient Medications  Medication Sig Dispense Refill  . acetaminophen (TYLENOL) 500 MG tablet Take 1-2 tablets (500-1,000  mg total) by mouth 3 (three) times daily as needed (arthritis pain). 30 tablet 0  . Ascorbic Acid (VITAMIN C) 500 MG CAPS Take 500 mg by mouth daily.    Marland Kitchen atorvastatin (LIPITOR) 10 MG tablet TAKE 1/2 TABLET BY MOUTH ONCE A DAY OR AS INSTRUCTED BY PHYSICIAN 45 tablet 3  . Calcium Carb-Cholecalciferol (CALCIUM 600+D) 600-800 MG-UNIT TABS Take by mouth daily.    . chlorhexidine (PERIDEX) 0.12 % solution Mouthwash - Use as directed    . Cholecalciferol (VITAMIN D3) 1000 UNITS CAPS Take 1 capsule by mouth daily.    Marland Kitchen diltiazem (CARDIZEM CD) 180 MG 24 hr capsule TAKE 1 CAPSULE (180 MG TOTAL) BY MOUTH DAILY. 90 capsule 3  . folic acid (FOLVITE) 408 MCG tablet Take 400 mcg by mouth daily.    Marland Kitchen ipratropium (ATROVENT) 0.06 % nasal spray PLACE 1 SPRAY INTO EACH NOSTRIL TWICE A DAY AS NEEDED 135 mL 1  . Magnesium 500 MG TABS Take 1 tablet by mouth daily.    . Multiple Vitamin (MULTIVITAMIN) capsule Take 1 capsule by mouth daily.      . Rivaroxaban (XARELTO) 15 MG TABS tablet Take 1 tablet (15 mg total) by mouth daily. 90 tablet 1   No current facility-administered medications for this visit.    Allergies:   Patient has no known  allergies.    ROS:  Please see the history of present illness.   Otherwise, review of systems are positive for none.   All other systems are reviewed and negative.    PHYSICAL EXAM: VS:  BP 122/68   Pulse 91   Ht 5\' 3"  (1.6 m)   Wt 124 lb 9.6 oz (56.5 kg)   SpO2 97%   BMI 22.07 kg/m  , BMI Body mass index is 22.07 kg/m. GENERAL:  Well appearing NECK:  No jugular venous distention, waveform within normal limits, carotid upstroke brisk and symmetric, no bruits, no thyromegaly LUNGS:  Clear to auscultation bilaterally CHEST:  Unremarkable HEART:  PMI not displaced or sustained,S1 and S2 within normal limits, no S3,  no clicks, no rubs, no murmurs, irregular ABD:  Flat, positive bowel sounds normal in frequency in pitch, no bruits, no rebound, no guarding, no midline  pulsatile mass, no hepatomegaly, no splenomegaly EXT:  2 plus pulses throughout, no edema, no cyanosis no clubbing   EKG:  EKG is  ordered today. The ekg ordered today demonstrates atrial fibrillation, rate 91, axis within normal limits, intervals within normal limits, poor anterior R wave progression, no acute ST-T wave changes.   Recent Labs: 09/09/2019: ALT 12; Hemoglobin 13.6; Platelets 290 03/08/2020: BUN 14; Creatinine, Ser 1.06; Potassium 4.8; Sodium 141; TSH 4.240    Lipid Panel    Component Value Date/Time   CHOL 160 09/09/2019 0829   CHOL 140 07/02/2013 1013   TRIG 79 09/09/2019 0829   TRIG 73 04/19/2016 0815   TRIG 71 07/02/2013 1013   HDL 67 09/09/2019 0829   HDL 62 04/19/2016 0815   HDL 61 07/02/2013 1013   CHOLHDL 2.4 09/09/2019 0829   LDLCALC 78 09/09/2019 0829   LDLCALC 76 01/17/2014 0914   LDLCALC 65 07/02/2013 1013      Wt Readings from Last 3 Encounters:  05/26/20 124 lb 9.6 oz (56.5 kg)  03/08/20 120 lb (54.4 kg)  09/09/19 123 lb (55.8 kg)      Other studies Reviewed: Additional studies/ records that were reviewed today include: Labs Review of the above records demonstrates:  See elsewhere   ASSESSMENT AND PLAN:  Permanent atrial fibrillation:    [name] has a CHA2DS2 - VASc score of 5.  Tolerates rate control and anticoagulation.  Is on the appropriate dose for her age and size.  She is up-to-date with blood work to include hemoglobin in August.  No change in therapy.    Current medicines are reviewed at length with the patient today.  The patient does not have concerns regarding medicines.  The following changes have been made:  None  Labs/ tests ordered today include: No  Orders Placed This Encounter  Procedures  . EKG 12-Lead     Disposition:   FU with me in 12 months.     Signed, Minus Breeding, MD  05/26/2020 11:26 AM    [facility]

## 2020-05-26 ENCOUNTER — Encounter: Payer: Self-pay | Admitting: Cardiology

## 2020-05-26 ENCOUNTER — Other Ambulatory Visit: Payer: Self-pay

## 2020-05-26 ENCOUNTER — Ambulatory Visit: Payer: Medicare PPO | Admitting: Cardiology

## 2020-05-26 VITALS — BP 122/68 | HR 91 | Ht 63.0 in | Wt 124.6 lb

## 2020-05-26 DIAGNOSIS — I4821 Permanent atrial fibrillation: Secondary | ICD-10-CM

## 2020-05-26 NOTE — Patient Instructions (Signed)
Medication Instructions:  No changes *If you need a refill on your cardiac medications before your next appointment, please call your pharmacy*   Lab Work: None ordered If you have labs (blood work) drawn today and your tests are completely normal, you will receive your results only by: . MyChart Message (if you have MyChart) OR . A paper copy in the mail If you have any lab test that is abnormal or we need to change your treatment, we will call you to review the results.   Testing/Procedures: None ordered   Follow-Up: At [facility], you and your health needs are our priority.  As part of our continuing mission to provide you with exceptional heart care, we have created designated Provider Care Teams.  These Care Teams include your primary Cardiologist (physician) and Advanced Practice Providers (APPs -  Physician Assistants and Nurse Practitioners) who all work together to provide you with the care you need, when you need it.  We recommend signing up for the patient portal called "MyChart".  Sign up information is provided on this After Visit Summary.  MyChart is used to connect with patients for Virtual Visits (Telemedicine).  Patients are able to view lab/test results, encounter notes, upcoming appointments, etc.  Non-urgent messages can be sent to your provider as well.   To learn more about what you can do with MyChart, go to [email or web address].    Your next appointment:   12 month(s)  The format for your next appointment:   In Person  Provider:   You may see Dr. Hochrein or one of the following Advanced Practice Providers on your designated Care Team:    Rhonda Barrett, PA-C  Kathryn Lawrence, DNP, ANP     

## 2020-09-06 NOTE — Progress Notes (Signed)
Subjective: CC: afib PCP: Lisa [name], DO ZOX:[id number] F [name] is a 87 y.o. female presenting to clinic today for:  1. Afib [name] reports compliance with Xarelto, Cardizem.  She denies any change in exercise tolerance and in fact exercises at least 3 times per week but stays physically active throughout the rest of the week.  She denies any chest pain, shortness of breath, edema, falls, hematochezia or melena.  She has not seen her cardiologist in person but did have a visit with him in October.  She just took her medications shortly before arrival today.  2.  Osteopenia [name] is compliant with calcium-vitamin D supplement.  She states physically active as above.  3.  Hyperlipidemia [name] is compliant with half tablet of Lipitor daily.  No reports of chest pain, shortness of breath or abdominal pain   ROS: Per HPI  No Known Allergies Past Medical History:  Diagnosis Date  . Arthritis   . AV malformation of gastrointestinal tract    in cecum she denies history of GIB  . Breast mass, right   . Cataract   . CVA (cerebral vascular accident) ([place]) 2007  . Dizziness    denies this as an ongoing problem  . Hyperlipidemia   . Microcytic anemia   . Osteopenia   . Persistent atrial fibrillation ([place])    s/p PVI 04/2010  CHADS2vasc at least 5  . RLS (restless legs syndrome)   . Sinus bradycardia     Current Outpatient Medications:  .  acetaminophen (TYLENOL) 500 MG tablet, Take 1-2 tablets (500-1,000 mg total) by mouth 3 (three) times daily as needed (arthritis pain)., Disp: 30 tablet, Rfl: 0 .  Ascorbic Acid (VITAMIN C) 500 MG CAPS, Take 500 mg by mouth daily., Disp: , Rfl:  .  atorvastatin (LIPITOR) 10 MG tablet, TAKE 1/2 TABLET BY MOUTH ONCE A DAY OR AS INSTRUCTED BY PHYSICIAN, Disp: 45 tablet, Rfl: 3 .  Calcium Carb-Cholecalciferol (CALCIUM 600+D) 600-800 MG-UNIT TABS, Take by mouth daily., Disp: , Rfl:  .  chlorhexidine (PERIDEX) 0.12 % solution, Mouthwash  - Use as directed, Disp: , Rfl:  .  Cholecalciferol (VITAMIN D3) 1000 UNITS CAPS, Take 1 capsule by mouth daily., Disp: , Rfl:  .  diltiazem (CARDIZEM CD) 180 MG 24 hr capsule, TAKE 1 CAPSULE (180 MG TOTAL) BY MOUTH DAILY., Disp: 90 capsule, Rfl: 3 .  folic acid (FOLVITE) 098 MCG tablet, Take 400 mcg by mouth daily., Disp: , Rfl:  .  ipratropium (ATROVENT) 0.06 % nasal spray, PLACE 1 SPRAY INTO EACH NOSTRIL TWICE A DAY AS NEEDED, Disp: 135 mL, Rfl: 1 .  Magnesium 500 MG TABS, Take 1 tablet by mouth daily., Disp: , Rfl:  .  Multiple Vitamin (MULTIVITAMIN) capsule, Take 1 capsule by mouth daily.  , Disp: , Rfl:  .  Rivaroxaban (XARELTO) 15 MG TABS tablet, Take 1 tablet (15 mg total) by mouth daily., Disp: 90 tablet, Rfl: 1 Social History   Socioeconomic History  . Marital status: Widowed    Spouse name: Not on file  . Number of children: 0  . Years of education: college  . Highest education level: Not on file  Occupational History  . Occupation: retired  Tobacco Use  . Smoking status: Former Smoker    Packs/day: 1.00    Years: 30.00    Pack years: 30.00    Types: Cigarettes    Quit date: 08/05/1978    Years since quitting: 42.1  . Smokeless tobacco: Never Used  .  Tobacco comment: quit in 1980s  Vaping Use  . Vaping Use: Never used  Substance and Sexual Activity  . Alcohol use: No  . Drug use: No  . Sexual activity: Never  Other Topics Concern  . Not on file  Social History Narrative   [name] is retired and lives at home alone. [name] has business college education.    Right handed.   Caffeine- some times - soda    Social Determinants of Health   Financial Resource Strain: Not on file  Food Insecurity: Not on file  Transportation Needs: Not on file  Physical Activity: Not on file  Stress: Not on file  Social Connections: Not on file  Intimate Partner Violence: Not on file   Family History  Problem Relation Age of Onset  . Stroke Mother 52  . Hip fracture Mother   .  Lung cancer Father 8  . Anesthesia problems Neg Hx   . Hypotension Neg Hx   . Malignant hyperthermia Neg Hx   . Pseudochol deficiency Neg Hx   . Breast cancer Neg Hx     Objective: Office vital signs reviewed. BP 124/74   Pulse 98   Temp (!) 97.1 F (36.2 C) (Temporal)   Ht 5' 3" (1.6 m)   Wt 121 lb (54.9 kg)   SpO2 96%   BMI 21.43 kg/m   Physical Examination:  General: Awake, alert, well nourished, No acute distress HEENT: Normal, sclera white Cardio: Irregularly irregular, S1S2 heard, no murmurs appreciated Pulm: clear to auscultation bilaterally, no wheezes, rhonchi or rales; normal work of breathing on room air GI: soft, non-tender, non-distended, bowel sounds present x4, no hepatomegaly, no splenomegaly, no masses Extremities: warm, well perfused, No edema, cyanosis or clubbing; +2 pulses bilaterally MSK: normal gait and station Skin: dry; intact; no rashes or lesions; normal temperature Neuro: No tremor  Assessment/ Plan: 87 y.o. female   Paroxysmal atrial fibrillation (HCC) - Plan: Thyroid Panel With TSH  Chronic anticoagulation - Plan: CBC  Stage 3a chronic kidney disease ([place]) - Plan: CMP14+EGFR  Pure hypercholesterolemia - Plan: CMP14+EGFR, Lipid Panel  Elevated TSH - Plan: Thyroid Panel With TSH  Low bone mass - Plan: CMP14+EGFR, VITAMIN D 25 Hydroxy (Vit-D Deficiency, Fractures)  Vitamin D deficiency - Plan: VITAMIN D 25 Hydroxy (Vit-D Deficiency, Fractures)  Compliant with all medications.  No problems with affordability of Xarelto.  No red flag signs or symptoms.  Her heart rate was borderline today, initially in the 120s on an automated machine and then a by manual repeat in the upper 90s.  I have asked that she monitor her pulse closely and low threshold to contact either myself or Dr. Percival Spanish should the pulse to stay above 100.  We may need to increase her CCB.  She is very active and in great shape for her age.  We will check her fasting labs,  vitamin D level and calcium.  Should consider updating her DEXA scan.   No orders of the defined types were placed in this encounter.  No orders of the defined types were placed in this encounter.    Lisa [name], DO [place] [phone]

## 2020-09-07 ENCOUNTER — Other Ambulatory Visit: Payer: Self-pay

## 2020-09-07 ENCOUNTER — Ambulatory Visit: Payer: Medicare PPO | Admitting: Family Medicine

## 2020-09-07 VITALS — BP 124/74 | HR 98 | Temp 97.1°F | Ht 63.0 in | Wt 121.0 lb

## 2020-09-07 DIAGNOSIS — R7989 Other specified abnormal findings of blood chemistry: Secondary | ICD-10-CM

## 2020-09-07 DIAGNOSIS — N1831 Chronic kidney disease, stage 3a: Secondary | ICD-10-CM

## 2020-09-07 DIAGNOSIS — M8580 Other specified disorders of bone density and structure, unspecified site: Secondary | ICD-10-CM | POA: Diagnosis not present

## 2020-09-07 DIAGNOSIS — E7800 Pure hypercholesterolemia, unspecified: Secondary | ICD-10-CM | POA: Diagnosis not present

## 2020-09-07 DIAGNOSIS — E559 Vitamin D deficiency, unspecified: Secondary | ICD-10-CM | POA: Diagnosis not present

## 2020-09-07 DIAGNOSIS — Z7901 Long term (current) use of anticoagulants: Secondary | ICD-10-CM

## 2020-09-07 DIAGNOSIS — I480 Paroxysmal atrial fibrillation: Secondary | ICD-10-CM

## 2020-09-07 MED ORDER — RIVAROXABAN 15 MG PO TABS: 15.0000 mg | ORAL_TABLET | Freq: Every day | ORAL | 3 refills | Status: DC

## 2020-09-07 MED ORDER — ATORVASTATIN CALCIUM 10 MG PO TABS: ORAL_TABLET | ORAL | 3 refills | Status: DC

## 2020-09-07 MED ORDER — DILTIAZEM HCL ER COATED BEADS 180 MG PO CP24: ORAL_CAPSULE | ORAL | 3 refills | Status: DC

## 2020-09-07 NOTE — Patient Instructions (Signed)
Keep an eye on pulse (heart rate). It was borderline high today.  If it stays above 100, contact Dr Percival Spanish immediately.  Remember to count how many beats happen in 1 minute (60seconds) while RESTING. This is your average heart rate.

## 2020-09-08 LAB — CBC
Hematocrit: 37.3 % (ref 34.0–46.6)
Hemoglobin: 12.4 g/dL (ref 11.1–15.9)
MCH: 31.2 pg (ref 26.6–33.0)
MCHC: 33.2 g/dL (ref 31.5–35.7)
MCV: 94 fL (ref 79–97)
Platelets: 303 10*3/uL (ref 150–450)
RBC: 3.98 x10E6/uL (ref 3.77–5.28)
RDW: 11.9 % (ref 11.7–15.4)
WBC: 8.3 10*3/uL (ref 3.4–10.8)

## 2020-09-08 LAB — CMP14+EGFR
ALT: 13 IU/L (ref 0–32)
AST: 26 IU/L (ref 0–40)
Albumin/Globulin Ratio: 1.4 (ref 1.2–2.2)
Albumin: 3.8 g/dL (ref 3.6–4.6)
Alkaline Phosphatase: 74 IU/L (ref 44–121)
BUN/Creatinine Ratio: 12 (ref 12–28)
BUN: 12 mg/dL (ref 8–27)
Bilirubin Total: 0.4 mg/dL (ref 0.0–1.2)
CO2: 22 mmol/L (ref 20–29)
Calcium: 9.6 mg/dL (ref 8.7–10.3)
Chloride: 102 mmol/L (ref 96–106)
Creatinine, Ser: 1.03 mg/dL — ABNORMAL HIGH (ref 0.57–1.00)
GFR calc Af Amer: 56 mL/min/{1.73_m2} — ABNORMAL LOW (ref >59)
GFR calc non Af Amer: 49 mL/min/{1.73_m2} — ABNORMAL LOW (ref >59)
Globulin, Total: 2.7 g/dL (ref 1.5–4.5)
Glucose: 87 mg/dL (ref 65–99)
Potassium: 4.4 mmol/L (ref 3.5–5.2)
Sodium: 141 mmol/L (ref 134–144)
Total Protein: 6.5 g/dL (ref 6.0–8.5)

## 2020-09-08 LAB — LIPID PANEL
Chol/HDL Ratio: 2.3 ratio (ref 0.0–4.4)
Cholesterol, Total: 152 mg/dL (ref 100–199)
HDL: 66 mg/dL (ref >39)
LDL Chol Calc (NIH): 72 mg/dL (ref 0–99)
Triglycerides: 70 mg/dL (ref 0–149)
VLDL Cholesterol Cal: 14 mg/dL (ref 5–40)

## 2020-09-08 LAB — THYROID PANEL WITH TSH
Free Thyroxine Index: 2.6 (ref 1.2–4.9)
T3 Uptake Ratio: 30 % (ref 24–39)
T4, Total: 8.5 ug/dL (ref 4.5–12.0)
TSH: 4.68 u[IU]/mL — ABNORMAL HIGH (ref 0.450–4.500)

## 2020-09-08 LAB — VITAMIN D 25 HYDROXY (VIT D DEFICIENCY, FRACTURES): Vit D, 25-Hydroxy: 48.6 ng/mL (ref 30.0–100.0)

## 2020-10-24 NOTE — Progress Notes (Signed)
Cardiology Office Note   Date:  10/25/2020   ID:  [name], DOB Feb 15, 1931, MRN [id number]  PCP:  Lisa [name], DO  Cardiologist:   Minus Breeding, MD   No chief complaint on file.     History of Present Illness: [name] is a 87 y.o. female who presents for follow up of atrial fib.   She has chronic atrial fib. Since I last saw her she has done she has done very well.  She does not notice her atrial fibrillation.  She lives alone.  She does her household chores.  She exercises routinely.  She does not notice her fibrillation and has had no presyncope or syncope.  The patient denies any new symptoms such as chest discomfort, neck or arm discomfort. There has been no new shortness of breath, PND or orthopnea. There have been no reported palpitations, presyncope or syncope.  Of note when she saw Lisa [name], DO her heart rate was 122 which is unusual.  She does not really check it at home but he does not notice it.  Past Medical History:  Diagnosis Date  . Arthritis   . AV malformation of gastrointestinal tract    in cecum she denies history of GIB  . Breast mass, right   . Cataract   . CVA (cerebral vascular accident) ([place]) 2007  . Dizziness    denies this as an ongoing problem  . Hyperlipidemia   . Microcytic anemia   . Osteopenia   . Persistent atrial fibrillation ([place])    s/p PVI 04/2010  CHADS2vasc at least 5  . RLS (restless legs syndrome)   . Sinus bradycardia     Past Surgical History:  Procedure Laterality Date  . afib ablation  04/2010   PVI with CTI ablation performed by [facility]  . BREAST EXCISIONAL BIOPSY Right    benign cyst  . breast mass resected    . CARDIOVERSION     for afib  . CATARACT EXTRACTION W/PHACO  11/11/2011   Procedure: CATARACT EXTRACTION PHACO AND INTRAOCULAR LENS PLACEMENT (IOC);  Surgeon: Tonny Branch, MD;  Location: [facility];  Service: Ophthalmology;  Laterality: Left;  CDE:12.62  . CATARACT  EXTRACTION W/PHACO  11/25/2011   Procedure: CATARACT EXTRACTION PHACO AND INTRAOCULAR LENS PLACEMENT (IOC);  Surgeon: Tonny Branch, MD;  Location: [facility];  Service: Ophthalmology;  Laterality: Right;  CDE 16.14  . dental inplants       Current Outpatient Medications  Medication Sig Dispense Refill  . acetaminophen (TYLENOL) 500 MG tablet Take 1-2 tablets (500-1,000 mg total) by mouth 3 (three) times daily as needed (arthritis pain). 30 tablet 0  . Ascorbic Acid (VITAMIN C) 500 MG CAPS Take 500 mg by mouth daily.    Marland Kitchen atorvastatin (LIPITOR) 10 MG tablet TAKE 1/2 TABLET BY MOUTH ONCE A DAY OR AS INSTRUCTED BY PHYSICIAN 45 tablet 3  . Calcium Carb-Cholecalciferol 600-800 MG-UNIT TABS Take by mouth daily.    . Cholecalciferol (VITAMIN D3) 1000 UNITS CAPS Take 1 capsule by mouth daily.    Marland Kitchen diltiazem (CARDIZEM CD) 180 MG 24 hr capsule TAKE 1 CAPSULE (180 MG TOTAL) BY MOUTH DAILY. 90 capsule 3  . folic acid (FOLVITE) 347 MCG tablet Take 400 mcg by mouth daily.    Marland Kitchen ipratropium (ATROVENT) 0.06 % nasal spray PLACE 1 SPRAY INTO EACH NOSTRIL TWICE A DAY AS NEEDED 135 mL 1  . Magnesium 500 MG TABS Take 1 tablet by mouth daily.    Marland Kitchen  Multiple Vitamin (MULTIVITAMIN) capsule Take 1 capsule by mouth daily.      . Rivaroxaban (XARELTO) 15 MG TABS tablet Take 1 tablet (15 mg total) by mouth daily. 90 tablet 3   No current facility-administered medications for this visit.    Allergies:   Patient has no known allergies.    ROS:  Please see the history of present illness.   Otherwise, review of systems are positive for none.   All other systems are reviewed and negative.    PHYSICAL EXAM: VS:  BP 110/70   Pulse 90   Ht 5\' 3"  (1.6 m)   Wt 121 lb (54.9 kg)   BMI 21.43 kg/m  , BMI Body mass index is 21.43 kg/m. GENERAL:  Well appearing NECK:  No jugular venous distention, waveform within normal limits, carotid upstroke brisk and symmetric, no bruits, no thyromegaly LUNGS:  Clear to auscultation  bilaterally CHEST:  Unremarkable HEART:  PMI not displaced or sustained,S1 and S2 within normal limits, no S3, no clicks, no rubs, no murmurs, irregular ABD:  Flat, positive bowel sounds normal in frequency in pitch, no bruits, no rebound, no guarding, no midline pulsatile mass, no hepatomegaly, no splenomegaly EXT:  2 plus pulses throughout, no edema, no cyanosis no clubbing   EKG:  EKG is  ordered today. The ekg ordered today demonstrates atrial fibrillation, rate 90,  axis within normal limits, intervals within normal limits, poor anterior R wave progression, no acute ST-T wave changes.   Recent Labs: 09/07/2020: ALT 13; BUN 12; Creatinine, Ser 1.03; Hemoglobin 12.4; Platelets 303; Potassium 4.4; Sodium 141; TSH 4.680    Lipid Panel    Component Value Date/Time   CHOL 152 09/07/2020 0824   CHOL 140 07/02/2013 1013   TRIG 70 09/07/2020 0824   TRIG 73 04/19/2016 0815   TRIG 71 07/02/2013 1013   HDL 66 09/07/2020 0824   HDL 62 04/19/2016 0815   HDL 61 07/02/2013 1013   CHOLHDL 2.3 09/07/2020 0824   LDLCALC 72 09/07/2020 0824   LDLCALC 76 01/17/2014 0914   LDLCALC 65 07/02/2013 1013      Wt Readings from Last 3 Encounters:  10/25/20 121 lb (54.9 kg)  09/07/20 121 lb (54.9 kg)  05/26/20 124 lb 9.6 oz (56.5 kg)      Other studies Reviewed: Additional studies/ records that were reviewed today include: Labs Review of the above records demonstrates:  See elsewhere   ASSESSMENT AND PLAN:  Permanent atrial fibrillation:    [name] has a CHA2DS2 - VASc score of 5.  She tolerated atrial fibrillation.  We talked about looking at a rate control with a pulse oximeter.  I also went back and looked GFR less than 50.  We talked about perhaps increasing her dose as she is borderline and she really wants to stay on the lower dose.     Current medicines are reviewed at length with the patient today.  The patient does not have concerns regarding medicines.  The  following changes have been made:  None  Labs/ tests ordered today include: None  No orders of the defined types were placed in this encounter.    Disposition:   FU with me in 12 months.     Signed, Minus Breeding, MD  10/25/2020 10:50 AM    [place]

## 2020-10-25 ENCOUNTER — Other Ambulatory Visit: Payer: Self-pay

## 2020-10-25 ENCOUNTER — Encounter: Payer: Self-pay | Admitting: Cardiology

## 2020-10-25 ENCOUNTER — Ambulatory Visit: Payer: Medicare PPO | Admitting: Cardiology

## 2020-10-25 VITALS — BP 110/70 | HR 90 | Ht 63.0 in | Wt 121.0 lb

## 2020-10-25 DIAGNOSIS — I482 Chronic atrial fibrillation, unspecified: Secondary | ICD-10-CM | POA: Diagnosis not present

## 2020-10-25 NOTE — Patient Instructions (Signed)
Medication Instructions:  The current medical regimen is effective;  continue present plan and medications.  *If you need a refill on your cardiac medications before your next appointment, please call your pharmacy*  Follow-Up: At [facility], you and your health needs are our priority.  As part of our continuing mission to provide you with exceptional heart care, we have created designated Provider Care Teams.  These Care Teams include your primary Cardiologist (physician) and Advanced Practice Providers (APPs -  Physician Assistants and Nurse Practitioners) who all work together to provide you with the care you need, when you need it.  We recommend signing up for the patient portal called "MyChart".  Sign up information is provided on this After Visit Summary.  MyChart is used to connect with patients for Virtual Visits (Telemedicine).  Patients are able to view lab/test results, encounter notes, upcoming appointments, etc.  Non-urgent messages can be sent to your provider as well.   To learn more about what you can do with MyChart, go to [email or web address].    Your next appointment:   12 month(s)  The format for your next appointment:   In Person  Provider:   James Hochrein, MD   Thank you for choosing [facility]!!     

## 2020-10-27 ENCOUNTER — Other Ambulatory Visit: Payer: Self-pay | Admitting: Family Medicine

## 2020-10-27 DIAGNOSIS — E7800 Pure hypercholesterolemia, unspecified: Secondary | ICD-10-CM

## 2021-01-09 ENCOUNTER — Other Ambulatory Visit: Payer: Self-pay | Admitting: Family Medicine

## 2021-01-09 DIAGNOSIS — Z1231 Encounter for screening mammogram for malignant neoplasm of breast: Secondary | ICD-10-CM

## 2021-03-05 ENCOUNTER — Other Ambulatory Visit: Payer: Self-pay

## 2021-03-05 ENCOUNTER — Ambulatory Visit
Admission: RE | Admit: 2021-03-05 | Discharge: 2021-03-05 | Disposition: A | Discharge status: Home / Self Care | Payer: Medicare PPO | Source: Ambulatory Visit | Attending: Family Medicine | Admitting: Family Medicine

## 2021-03-05 DIAGNOSIS — Z1231 Encounter for screening mammogram for malignant neoplasm of breast: Secondary | ICD-10-CM | POA: Diagnosis not present

## 2021-03-07 ENCOUNTER — Other Ambulatory Visit: Payer: Self-pay

## 2021-03-07 ENCOUNTER — Ambulatory Visit (INDEPENDENT_AMBULATORY_CARE_PROVIDER_SITE_OTHER): Payer: Medicare PPO

## 2021-03-07 ENCOUNTER — Encounter: Payer: Self-pay | Admitting: Family Medicine

## 2021-03-07 ENCOUNTER — Ambulatory Visit: Payer: Medicare PPO | Admitting: Family Medicine

## 2021-03-07 VITALS — BP 110/66 | HR 80 | Temp 97.9°F | Resp 20 | Ht 63.0 in | Wt 122.0 lb

## 2021-03-07 DIAGNOSIS — E7800 Pure hypercholesterolemia, unspecified: Secondary | ICD-10-CM

## 2021-03-07 DIAGNOSIS — Z7901 Long term (current) use of anticoagulants: Secondary | ICD-10-CM | POA: Diagnosis not present

## 2021-03-07 DIAGNOSIS — R7989 Other specified abnormal findings of blood chemistry: Secondary | ICD-10-CM | POA: Diagnosis not present

## 2021-03-07 DIAGNOSIS — M8580 Other specified disorders of bone density and structure, unspecified site: Secondary | ICD-10-CM

## 2021-03-07 DIAGNOSIS — N1831 Chronic kidney disease, stage 3a: Secondary | ICD-10-CM | POA: Diagnosis not present

## 2021-03-07 DIAGNOSIS — I480 Paroxysmal atrial fibrillation: Secondary | ICD-10-CM

## 2021-03-07 DIAGNOSIS — Z23 Encounter for immunization: Secondary | ICD-10-CM

## 2021-03-07 LAB — HEMOGLOBIN, FINGERSTICK: Hemoglobin: 12.3 g/dL (ref 11.1–15.9)

## 2021-03-07 NOTE — Progress Notes (Signed)
Subjective: CC: Afib PCP: Janora [name], DO [id number] [name] is a 87 y.o. female presenting to clinic today for:  1. Afib on chronic anticoagulation.  CKD 3A and hyperlipidemia She reports that she has been doing very well since her last visit.  She continues remains physically active, exercising in an aerobics class for at least 45 minutes daily Mondays through Fridays.  She had no change in exercise tolerance.  No shortness of breath or chest pain.  No heart palpitations.  Anticoagulated with xarelto. No bleeding.  Noted to have elevated TSH but normal T4 last visit.    She is compliant with half a tablet of Lipitor 10 mg daily.  Again no chest pain or change in exercise tolerance.  Urine output is good.  ROS: Per HPI  No Known Allergies Past Medical History:  Diagnosis Date   Arthritis    AV malformation of gastrointestinal tract    in cecum she denies history of GIB   Breast mass, right    Cataract    CVA (cerebral vascular accident) ([place]) 2007   Dizziness    denies this as an ongoing problem   Hyperlipidemia    Microcytic anemia    Osteopenia    Persistent atrial fibrillation (HCC)    s/p PVI 04/2010  CHADS2vasc at least 5   RLS (restless legs syndrome)    Sinus bradycardia     Current Outpatient Medications:    acetaminophen (TYLENOL) 500 MG tablet, Take 1-2 tablets (500-1,000 mg total) by mouth 3 (three) times daily as needed (arthritis pain)., Disp: 30 tablet, Rfl: 0   Ascorbic Acid (VITAMIN C) 500 MG CAPS, Take 500 mg by mouth daily., Disp: , Rfl:    atorvastatin (LIPITOR) 10 MG tablet, TAKE 1/2 TABLET BY MOUTH ONCE A DAY OR AS INSTRUCTED BY PHYSICIAN, Disp: 45 tablet, Rfl: 3   Calcium Carb-Cholecalciferol 600-800 MG-UNIT TABS, Take by mouth daily., Disp: , Rfl:    Cholecalciferol (VITAMIN D3) 1000 UNITS CAPS, Take 1 capsule by mouth daily., Disp: , Rfl:    diltiazem (CARDIZEM CD) 180 MG 24 hr capsule, TAKE 1 CAPSULE (180 MG TOTAL) BY MOUTH DAILY.,  Disp: 90 capsule, Rfl: 3   folic acid (FOLVITE) [id number] MCG tablet, Take 400 mcg by mouth daily., Disp: , Rfl:    ipratropium (ATROVENT) 0.06 % nasal spray, PLACE 1 SPRAY INTO EACH NOSTRIL TWICE A DAY AS NEEDED, Disp: 135 mL, Rfl: 1   Magnesium 500 MG TABS, Take 1 tablet by mouth daily., Disp: , Rfl:    Multiple Vitamin (MULTIVITAMIN) capsule, Take 1 capsule by mouth daily.  , Disp: , Rfl:    Rivaroxaban (XARELTO) 15 MG TABS tablet, Take 1 tablet (15 mg total) by mouth daily., Disp: 90 tablet, Rfl: 3 Social History   Socioeconomic History   Marital status: Widowed    Spouse name: Not on file   Number of children: 0   Years of education: college   Highest education level: Not on file  Occupational History   Occupation: retired  Tobacco Use   Smoking status: Former    Packs/day: 1.00    Years: 30.00    Pack years: 30.00    Types: Cigarettes    Quit date: 08/05/1978    Years since quitting: 42.6   Smokeless tobacco: Never   Tobacco comments:    quit in 1980s  Vaping Use   Vaping Use: Never used  Substance and Sexual Activity   Alcohol use: No   Drug  use: No   Sexual activity: Never  Other Topics Concern   Not on file  Social History Narrative   Patient is retired and lives at home alone. Patient has business college education.    Right handed.   Caffeine- some times - soda    Social Determinants of Health   Financial Resource Strain: Not on file  Food Insecurity: Not on file  Transportation Needs: Not on file  Physical Activity: Not on file  Stress: Not on file  Social Connections: Not on file  Intimate Partner Violence: Not on file   Family History  Problem Relation Age of Onset   Stroke Mother 35   Hip fracture Mother    Lung cancer Father 41   Anesthesia problems Neg Hx    Hypotension Neg Hx    Malignant hyperthermia Neg Hx    Pseudochol deficiency Neg Hx    Breast cancer Neg Hx     Objective: Office vital signs reviewed. BP 110/66   Pulse 80   Temp 97.9 Lisa  (36.6 C)   Resp 20   Ht '5\' 3"'$  (1.6 m)   Wt 122 lb (55.3 kg)   SpO2 99%   BMI 21.61 kg/m   Physical Examination:  General: Awake, alert, well nourished, No acute distress HEENT: Normal; sclera white.  No goiter.  No carotid bruits Cardio: Irregularly irregular with rate control, S1S2 heard, no murmurs appreciated Pulm: clear to auscultation bilaterally, no wheezes, rhonchi or rales; normal work of breathing on room air GI: soft, non-tender, non-distended, bowel sounds present x4, no hepatomegaly, no splenomegaly, no masses Extremities: warm, well perfused, No edema, cyanosis or clubbing; +2 pulses bilaterally MSK: normal gait and station Skin: Seborrheic keratoses noted along the anterior lower extremities bilaterally Neuro: No tremor  Assessment/ Plan: 87 y.o. female   Paroxysmal atrial fibrillation ([place]) - Plan: Hemoglobin, fingerstick  Chronic anticoagulation - Plan: Hemoglobin, fingerstick  Stage 3a chronic kidney disease ([place]) - Plan: Renal Function Panel, Hemoglobin, fingerstick, DG [facility] DEXA  Pure hypercholesterolemia  Elevated TSH - Plan: TSH, T4, Free  Low bone mass - Plan: DG [facility] DEXA  Rhythm was atrial fibrillation today but she had rate control.  Check fingerstick hemoglobin given chronic anticoagulation.  She did not endorse any red flag signs or symptoms today  Check renal function panel to ensure stability of renal function.  Not yet due for fasting lipid panel  Noted to have a mildly elevated TSH with normal free T4 last visit.  Recheck these labs  Plan for DEXA scan today given low bone mass  No orders of the defined types were placed in this encounter.  No orders of the defined types were placed in this encounter.    Janora [name], DO [place] [phone]

## 2021-03-07 NOTE — Patient Instructions (Signed)

## 2021-03-07 NOTE — Progress Notes (Signed)
Pt aware.

## 2021-03-08 LAB — RENAL FUNCTION PANEL
Albumin: 3.9 g/dL (ref 3.6–4.6)
BUN/Creatinine Ratio: 10 — ABNORMAL LOW (ref 12–28)
BUN: 11 mg/dL (ref 8–27)
CO2: 23 mmol/L (ref 20–29)
Calcium: 9.8 mg/dL (ref 8.7–10.3)
Chloride: 104 mmol/L (ref 96–106)
Creatinine, Ser: 1.08 mg/dL — ABNORMAL HIGH (ref 0.57–1.00)
Glucose: 89 mg/dL (ref 65–99)
Phosphorus: 3.3 mg/dL (ref 3.0–4.3)
Potassium: 4.5 mmol/L (ref 3.5–5.2)
Sodium: 140 mmol/L (ref 134–144)
eGFR: 49 mL/min/{1.73_m2} — ABNORMAL LOW (ref >59)

## 2021-03-08 LAB — TSH: TSH: 3.86 u[IU]/mL (ref 0.450–4.500)

## 2021-03-08 LAB — T4, FREE: Free T4: 1.36 ng/dL (ref 0.82–1.77)

## 2021-03-09 DIAGNOSIS — M85851 Other specified disorders of bone density and structure, right thigh: Secondary | ICD-10-CM | POA: Diagnosis not present

## 2021-03-09 DIAGNOSIS — Z780 Asymptomatic menopausal state: Secondary | ICD-10-CM | POA: Diagnosis not present

## 2021-03-09 NOTE — Progress Notes (Signed)
Pt r/c about labs 

## 2021-03-15 ENCOUNTER — Telehealth: Payer: Self-pay | Admitting: Family Medicine

## 2021-03-15 NOTE — Telephone Encounter (Signed)
Patient aware and verbalized understanding. °

## 2021-04-02 ENCOUNTER — Ambulatory Visit: Payer: Medicare PPO | Admitting: Family Medicine

## 2021-05-21 ENCOUNTER — Telehealth: Payer: Self-pay | Admitting: Family Medicine

## 2021-05-21 NOTE — Telephone Encounter (Signed)
Left message for patient to call back and schedule Medicare Annual Wellness Visit    Last AWVS 04/13/18 ; please schedule at anytime with health coach  This should be a 45 minute visit.

## 2021-05-24 ENCOUNTER — Telehealth: Payer: Self-pay | Admitting: Family Medicine

## 2021-05-24 DIAGNOSIS — Z23 Encounter for immunization: Secondary | ICD-10-CM | POA: Diagnosis not present

## 2021-05-24 NOTE — Telephone Encounter (Signed)
Left message for patient to call back and schedule Medicare Annual Wellness Visit (AWV) either virtually or in office. Left  my Herbie Drape number [phone]  I returned patient call from 05/23/21  Last AWV 04/23/18 please schedule at anytime with [facility] Nurse Health Advisor 1 or 2   This should be a 45 minute visit.

## 2021-06-01 ENCOUNTER — Ambulatory Visit (INDEPENDENT_AMBULATORY_CARE_PROVIDER_SITE_OTHER): Payer: Medicare PPO

## 2021-06-01 VITALS — Ht 63.0 in | Wt 122.0 lb

## 2021-06-01 DIAGNOSIS — Z0000 Encounter for general adult medical examination without abnormal findings: Secondary | ICD-10-CM

## 2021-06-01 NOTE — Progress Notes (Signed)
Subjective:   [name] is a 88 y.o. female who presents for Medicare Annual (Subsequent) preventive examination.  Virtual Visit via Telephone Note  I connected with  [name] on 06/01/21 at  2:00 PM EDT by telephone and verified that I am speaking with the correct person using two identifiers.  Location: Patient: Home Provider: [facility] Persons participating in the virtual visit: patient/Nurse Health Advisor   I discussed the limitations, risks, security and privacy concerns of performing an evaluation and management service by telephone and the availability of in person appointments. The patient expressed understanding and agreed to proceed.  Interactive audio and video telecommunications were attempted between this nurse and patient, however failed, due to patient having technical difficulties OR patient did not have access to video capability.  We continued and completed visit with audio only.  Some vital signs may be absent or patient reported.   Raye Slyter E Benigno Check, LPN   Review of Systems     Cardiac Risk Factors include: advanced age (>43men, >38 women);hypertension;Other (see comment), Risk factor comments: hx of CVA, A.Fib     Objective:    Today's Vitals   06/01/21 1404  Weight: 122 lb (55.3 kg)  Height: 5\' 3"  (1.6 m)   Body mass index is 21.61 kg/m.  Advanced Directives 06/01/2021 04/13/2018 05/07/2016 02/17/2013 11/06/2011  Does Patient Have a Medical Advance Directive? Yes Yes Yes Patient has advance directive, copy not in chart Patient has advance directive, copy not in chart  Type of Advance Directive [place];Living will [place];Living will [place];Living will - [place];Living will  Does patient want to make changes to medical advance directive? - No - Patient declined No - Patient declined - -  Copy of [place] in Chart? No - copy requested  No - copy requested No - copy requested - Copy requested from family    Current Medications (verified) Outpatient Encounter Medications as of 06/01/2021  Medication Sig   acetaminophen (TYLENOL) 500 MG tablet Take 1-2 tablets (500-1,000 mg total) by mouth 3 (three) times daily as needed (arthritis pain).   Ascorbic Acid (VITAMIN C) 500 MG CAPS Take 500 mg by mouth daily.   atorvastatin (LIPITOR) 10 MG tablet TAKE 1/2 TABLET BY MOUTH ONCE A DAY OR AS INSTRUCTED BY PHYSICIAN   Calcium Carb-Cholecalciferol 600-800 MG-UNIT TABS Take by mouth daily.   Cholecalciferol (VITAMIN D3) 1000 UNITS CAPS Take 1 capsule by mouth daily.   diltiazem (CARDIZEM CD) 180 MG 24 hr capsule TAKE 1 CAPSULE (180 MG TOTAL) BY MOUTH DAILY.   folic acid (FOLVITE) 109 MCG tablet Take 400 mcg by mouth daily.   ipratropium (ATROVENT) 0.06 % nasal spray PLACE 1 SPRAY INTO EACH NOSTRIL TWICE A DAY AS NEEDED   Magnesium 500 MG TABS Take 1 tablet by mouth daily.   Multiple Vitamin (MULTIVITAMIN) capsule Take 1 capsule by mouth daily.     Rivaroxaban (XARELTO) 15 MG TABS tablet Take 1 tablet (15 mg total) by mouth daily.   [DISCONTINUED] Calcium Carbonate (CALCIUM 500 PO) Take 1 tablet by mouth daily.     No facility-administered encounter medications on file as of 06/01/2021.    Allergies (verified) Patient has no known allergies.   History: Past Medical History:  Diagnosis Date   Arthritis    AV malformation of gastrointestinal tract    in cecum she denies history of GIB   Breast mass, right  Cataract    CVA (cerebral vascular accident) [facility]) 2007   Dizziness    denies this as an ongoing problem   Hyperlipidemia    Microcytic anemia    Osteopenia    Persistent atrial fibrillation ([place])    s/p PVI 04/2010  CHADS2vasc at least 5   RLS (restless legs syndrome)    Sinus bradycardia    Past Surgical History:  Procedure Laterality Date   afib ablation  04/2010   PVI with CTI ablation performed by [facility]   BREAST  EXCISIONAL BIOPSY Right    benign cyst   breast mass resected     CARDIOVERSION     for afib   CATARACT EXTRACTION W/PHACO  11/11/2011   Procedure: CATARACT EXTRACTION PHACO AND INTRAOCULAR LENS PLACEMENT ([place]);  Surgeon: Tonny Branch, MD;  Location: [facility];  Service: Ophthalmology;  Laterality: Left;  CDE:12.62   CATARACT EXTRACTION W/PHACO  11/25/2011   Procedure: CATARACT EXTRACTION PHACO AND INTRAOCULAR LENS PLACEMENT ([facility]);  Surgeon: Tonny Branch, MD;  Location: [facility];  Service: Ophthalmology;  Laterality: Right;  CDE 16.14   dental inplants     Family History  Problem Relation Age of Onset   Stroke Mother 35   Hip fracture Mother    Lung cancer Father 42   Anesthesia problems Neg Hx    Hypotension Neg Hx    Malignant hyperthermia Neg Hx    Pseudochol deficiency Neg Hx    Breast cancer Neg Hx    Social History   Socioeconomic History   Marital status: Widowed    Spouse name: Not on file   Number of children: 0   Years of education: college   Highest education level: Not on file  Occupational History   Occupation: retired  Tobacco Use   Smoking status: Former    Packs/day: 1.00    Years: 30.00    Pack years: 30.00    Types: Cigarettes    Quit date: 08/05/1978    Years since quitting: 42.8   Smokeless tobacco: Never   Tobacco comments:    quit in 1980s  Vaping Use   Vaping Use: Never used  Substance and Sexual Activity   Alcohol use: No   Drug use: No   Sexual activity: Never  Other Topics Concern   Not on file  Social History Narrative   Patient is retired and lives at home alone. Patient has business college education. Right handed.Caffeine- some times - soda    Daily exercise at [place] walks a mile a day at home.    Church every Sunday   Social Determinants of Health   Financial Resource Strain: Low Risk    Difficulty of Paying Living Expenses: Not hard at all  Food Insecurity: No Food Insecurity   Worried About Charity fundraiser in the Last Year:  Never true   Arboriculturist in the Last Year: Never true  Transportation Needs: No Transportation Needs   Lack of Transportation (Medical): No   Lack of Transportation (Non-Medical): No  Physical Activity: Sufficiently Active   Days of Exercise per Week: 6 days   Minutes of Exercise per Session: 60 min  Stress: No Stress Concern Present   Feeling of Stress : Not at all  Social Connections: Moderately Integrated   Frequency of Communication with Friends and Family: More than three times a week   Frequency of Social Gatherings with Friends and Family: More than three times a week   Attends Religious  Services: More than 4 times per year   Active Member of Clubs or Organizations: Yes   Attends Archivist Meetings: More than 4 times per year   Marital Status: Widowed    Tobacco Counseling Counseling given: Not Answered Tobacco comments: quit in 1980s   Clinical Intake:  Pre-visit preparation completed: Yes  Pain : No/denies pain     BMI - recorded: 21.61 Nutritional Status: BMI of 19-24  Normal Nutritional Risks: None Diabetes: No  How often do you need to have someone help you when you read instructions, pamphlets, or other written materials from your doctor or pharmacy?: 1 - Never  Diabetic? no  Interpreter Needed?: No  Information entered by :: Jaiyana Canale, LPN   Activities of Daily Living In your present state of health, do you have any difficulty performing the following activities: 06/01/2021  Hearing? Y  Comment wears hearing aids  Vision? N  Difficulty concentrating or making decisions? N  Walking or climbing stairs? N  Dressing or bathing? N  Doing errands, shopping? N  Preparing Food and eating ? N  Using the Toilet? N  In the past six months, have you accidently leaked urine? N  Do you have problems with loss of bowel control? N  Managing your Medications? N  Managing your Finances? N  Housekeeping or managing your Housekeeping? N  Some  recent data might be hidden    Patient Care Team: Janora [name], DO as PCP - General (Family Medicine) Thompson Grayer, MD as Consulting Physician (Cardiology)  Indicate any recent Medical Services you may have received from other than [facility] providers in the past year (date may be approximate).     Assessment:   This is a routine wellness examination for [place].  Hearing/Vision screen Hearing Screening - Comments:: Wears hearing aids - from [facility] Vision Screening - Comments:: Wears reading glasses otc prn - up to date with annual eye exams at [place] issues and exercise activities discussed: Current Exercise Habits: Home exercise routine;Structured exercise class, Type of exercise: walking;calisthenics;stretching ([facility] exercise classes daily - walks 1 mile at home if she doesn't go there), Time (Minutes): 45, Frequency (Times/Week): 6, Weekly Exercise (Minutes/Week): 270, Intensity: Moderate, Exercise limited by: None identified   Goals Addressed             This Visit's Progress    Exercise 3x per week (30 min per time)   On track    Continue to exercise for at least 30 minutes, 3 times weekly     Have 3 meals a day   On track    Eat at least 3 healthy meals daily       Depression Screen PHQ 2/9 Scores 06/01/2021 03/07/2021 09/07/2020 03/08/2020 09/09/2019 03/09/2019 10/14/2018  PHQ - 2 Score 0 0 0 0 0 0 0  PHQ- 9 Score - 0 0 - 0 - -    Fall Risk Fall Risk  06/01/2021 03/07/2021 09/07/2020 03/08/2020 09/09/2019  Falls in the past year? 0 0 0 0 0  Number falls in past yr: 0 - - - -  Injury with Fall? 0 - - - -  Risk for fall due to : No Fall Risks - - - -  Follow up Falls prevention discussed - - - -    FALL RISK PREVENTION PERTAINING TO THE HOME:  Any stairs in or around the home? Yes  If so, are there any without handrails? No  Home free of  loose throw rugs in walkways, pet beds, electrical cords, etc? Yes  Adequate lighting in your home to  reduce risk of falls? Yes   ASSISTIVE DEVICES UTILIZED TO PREVENT FALLS:  Life alert? No  Use of a cane, walker or w/c? No  Grab bars in the bathroom? Yes  Shower chair or bench in shower? No  Elevated toilet seat or a handicapped toilet? No   TIMED UP AND GO:  Was the test performed? No . Telephonic visit  Cognitive Function: MMSE - Mini Mental State Exam 04/13/2018 05/07/2016  Orientation to time 5 5  Orientation to Place 5 5  Registration 3 3  Attention/ Calculation 5 5  Recall 2 3  Language- name 2 objects 2 2  Language- repeat 1 1  Language- follow 3 step command 3 3  Language- read & follow direction 1 1  Write a sentence 1 1  Copy design 1 1  Total score 29 30     6CIT Screen 06/01/2021  What Year? 0 points  What month? 0 points  What time? 0 points  Count back from 20 0 points  Months in reverse 0 points  Repeat phrase 0 points  Total Score 0    Immunizations Immunization History  Administered Date(s) Administered   Influenza, High Dose Seasonal PF 05/07/2017, 06/26/2018   Influenza,inj,Quad PF,6+ Mos 07/27/2013, 05/19/2014, 08/24/2015, 05/07/2016, 05/27/2019   Influenza,inj,quad, With Preservative 04/07/2017   Influenza-Unspecified 05/24/2021   Moderna Sars-Covid-2 Vaccination 09/30/2019, 10/29/2019, 07/26/2020   Pneumococcal Conjugate-13 10/07/2013   Pneumococcal Polysaccharide-23 03/09/2019   Tdap 05/07/2011   Zoster Recombinat (Shingrix) 03/07/2021   Zoster, Live 09/30/2014    TDAP status: Due, Education has been provided regarding the importance of this vaccine. Advised may receive this vaccine at local pharmacy or Health Dept. Aware to provide a copy of the vaccination record if obtained from local pharmacy or Health Dept. Verbalized acceptance and understanding.  Flu Vaccine status: Up to date  Pneumococcal vaccine status: Up to date  Covid-19 vaccine status: Completed vaccines  Qualifies for Shingles Vaccine? Yes   Zostavax completed Yes    Shingrix Completed?: No.    Education has been provided regarding the importance of this vaccine. Patient has been advised to call insurance company to determine out of pocket expense if they have not yet received this vaccine. Advised may also receive vaccine at local pharmacy or Health Dept. Verbalized acceptance and understanding.  Screening Tests Health Maintenance  Topic Date Due   COVID-19 Vaccine (4 - Booster for Moderna series) 09/20/2020   Zoster Vaccines- Shingrix (2 of 2) 05/02/2021   TETANUS/TDAP  05/06/2021   MAMMOGRAM  03/05/2022   DEXA SCAN  03/10/2023   Pneumonia Vaccine 103+ Years old  Completed   INFLUENZA VACCINE  Completed   HPV VACCINES  Aged Out    Health Maintenance  Health Maintenance Due  Topic Date Due   COVID-19 Vaccine (4 - Booster for Moderna series) 09/20/2020   Zoster Vaccines- Shingrix (2 of 2) 05/02/2021   TETANUS/TDAP  05/06/2021    Colorectal cancer screening: No longer required.   Mammogram status: Completed 03/05/2021. Repeat every year  Bone Density status: Completed 03/09/2021. Results reflect: Bone density results: OSTEOPENIA. Repeat every 2 years.  Lung Cancer Screening: (Low Dose CT Chest recommended if Age 26-80 years, 30 pack-year currently smoking OR have quit w/in 15years.) does not qualify.  Additional Screening:  Hepatitis C Screening: does not qualify  Vision Screening: Recommended annual ophthalmology exams for early detection of  glaucoma and other disorders of the eye. Is the patient up to date with their annual eye exam?  Yes  Who is the provider or what is the name of the office in which the patient attends annual eye exams? [facility] If pt is not established with a provider, would they like to be referred to a provider to establish care? No .   Dental Screening: Recommended annual dental exams for proper oral hygiene  Community Resource Referral / Chronic Care Management: CRR required this visit?  No   CCM required  this visit?  No      Plan:     I have personally reviewed and noted the following in the patient's chart:   Medical and social history Use of alcohol, tobacco or illicit drugs  Current medications and supplements including opioid prescriptions.  Functional ability and status Nutritional status Physical activity Advanced directives List of other physicians Hospitalizations, surgeries, and ER visits in previous 12 months Vitals Screenings to include cognitive, depression, and falls Referrals and appointments  In addition, I have reviewed and discussed with patient certain preventive protocols, quality metrics, and best practice recommendations. A written personalized care plan for preventive services as well as general preventive health recommendations were provided to patient.     Sandrea Hammond, LPN   [id number]   Nurse Notes: None

## 2021-06-01 NOTE — Patient Instructions (Signed)
[name] , Thank you for taking time to come for your Medicare Wellness Visit. I appreciate your ongoing commitment to your health goals. Please review the following plan we discussed and let me know if I can assist you in the future.   Screening recommendations/referrals: Colonoscopy: No longer required Mammogram: Done 03/05/2021 - Repeat annually Bone Density: Done 03/09/2021 - Repeat every 2 years  Recommended yearly ophthalmology/optometry visit for glaucoma screening and checkup Recommended yearly dental visit for hygiene and checkup  Vaccinations: Influenza vaccine: Done 05/24/2021 - Repeat annually Pneumococcal vaccine: Done 10/07/2013 & 03/09/2019 Tdap vaccine: Done 05/07/2011 - Repeat in 10 years *due  Shingles vaccine: Done 03/07/2021 - Second dose scheduled at [facility] 06/21/21 @ 9am   Covid-19: Done 09/30/2019, 10/29/2019, & 07/26/2020  Advanced directives: Please bring a copy of your health care power of attorney and living will to the office to be added to your chart at your convenience.   Conditions/risks identified: Aim for 30 minutes of exercise or brisk walking each day, drink 6-8 glasses of water and eat lots of fruits and vegetables.   Next appointment: Follow up in one year for your annual wellness visit    Preventive Care 65 Years and Older, Female Preventive care refers to lifestyle choices and visits with your health care provider that can promote health and wellness. What does preventive care include? A yearly physical exam. This is also called an annual well check. Dental exams once or twice a year. Routine eye exams. Ask your health care provider how often you should have your eyes checked. Personal lifestyle choices, including: Daily care of your teeth and gums. Regular physical activity. Eating a healthy diet. Avoiding tobacco and drug use. Limiting alcohol use. Practicing safe sex. Taking low-dose aspirin every day. Taking vitamin and mineral supplements as  recommended by your health care provider. What happens during an annual well check? The services and screenings done by your health care provider during your annual well check will depend on your age, overall health, lifestyle risk factors, and family history of disease. Counseling  Your health care provider may ask you questions about your: Alcohol use. Tobacco use. Drug use. Emotional well-being. Home and relationship well-being. Sexual activity. Eating habits. History of falls. Memory and ability to understand (cognition). Work and work Statistician. Reproductive health. Screening  You may have the following tests or measurements: Height, weight, and BMI. Blood pressure. Lipid and cholesterol levels. These may be checked every 5 years, or more frequently if you are over 17 years old. Skin check. Lung cancer screening. You may have this screening every year starting at age 62 if you have a 30-pack-year history of smoking and currently smoke or have quit within the past 15 years. Fecal occult blood test (FOBT) of the stool. You may have this test every year starting at age 53. Flexible sigmoidoscopy or colonoscopy. You may have a sigmoidoscopy every 5 years or a colonoscopy every 10 years starting at age 47. Hepatitis C blood test. Hepatitis B blood test. Sexually transmitted disease (STD) testing. Diabetes screening. This is done by checking your blood sugar (glucose) after you have not eaten for a while (fasting). You may have this done every 1-3 years. Bone density scan. This is done to screen for osteoporosis. You may have this done starting at age 44. Mammogram. This may be done every 1-2 years. Talk to your health care provider about how often you should have regular mammograms. Talk with your health care provider about your  test results, treatment options, and if necessary, the need for more tests. Vaccines  Your health care provider may recommend certain vaccines, such  as: Influenza vaccine. This is recommended every year. Tetanus, diphtheria, and acellular pertussis (Tdap, Td) vaccine. You may need a Td booster every 10 years. Zoster vaccine. You may need this after age 76. Pneumococcal 13-valent conjugate (PCV13) vaccine. One dose is recommended after age 24. Pneumococcal polysaccharide (PPSV23) vaccine. One dose is recommended after age 20. Talk to your health care provider about which screenings and vaccines you need and how often you need them. This information is not intended to replace advice given to you by your health care provider. Make sure you discuss any questions you have with your health care provider. Document Released: 08/18/2015 Document Revised: 04/10/2016 Document Reviewed: 05/23/2015 Elsevier Interactive Patient Education  2017 [employer] Prevention in the Home Falls can cause injuries. They can happen to people of all ages. There are many things you can do to make your home safe and to help prevent falls. What can I do on the outside of my home? Regularly fix the edges of walkways and driveways and fix any cracks. Remove anything that might make you trip as you walk through a door, such as a raised step or threshold. Trim any bushes or trees on the path to your home. Use bright outdoor lighting. Clear any walking paths of anything that might make someone trip, such as rocks or tools. Regularly check to see if handrails are loose or broken. Make sure that both sides of any steps have handrails. Any raised decks and porches should have guardrails on the edges. Have any leaves, snow, or ice cleared regularly. Use sand or salt on walking paths during winter. Clean up any spills in your garage right away. This includes oil or grease spills. What can I do in the bathroom? Use night lights. Install grab bars by the toilet and in the tub and shower. Do not use towel bars as grab bars. Use non-skid mats or decals in the tub or  shower. If you need to sit down in the shower, use a plastic, non-slip stool. Keep the floor dry. Clean up any water that spills on the floor as soon as it happens. Remove soap buildup in the tub or shower regularly. Attach bath mats securely with double-sided non-slip rug tape. Do not have throw rugs and other things on the floor that can make you trip. What can I do in the bedroom? Use night lights. Make sure that you have a light by your bed that is easy to reach. Do not use any sheets or blankets that are too big for your bed. They should not hang down onto the floor. Have a firm chair that has side arms. You can use this for support while you get dressed. Do not have throw rugs and other things on the floor that can make you trip. What can I do in the kitchen? Clean up any spills right away. Avoid walking on wet floors. Keep items that you use a lot in easy-to-reach places. If you need to reach something above you, use a strong step stool that has a grab bar. Keep electrical cords out of the way. Do not use floor polish or wax that makes floors slippery. If you must use wax, use non-skid floor wax. Do not have throw rugs and other things on the floor that can make you trip. What can I do with my  stairs? Do not leave any items on the stairs. Make sure that there are handrails on both sides of the stairs and use them. Fix handrails that are broken or loose. Make sure that handrails are as long as the stairways. Check any carpeting to make sure that it is firmly attached to the stairs. Fix any carpet that is loose or worn. Avoid having throw rugs at the top or bottom of the stairs. If you do have throw rugs, attach them to the floor with carpet tape. Make sure that you have a light switch at the top of the stairs and the bottom of the stairs. If you do not have them, ask someone to add them for you. What else can I do to help prevent falls? Wear shoes that: Do not have high heels. Have  rubber bottoms. Are comfortable and fit you well. Are closed at the toe. Do not wear sandals. If you use a stepladder: Make sure that it is fully opened. Do not climb a closed stepladder. Make sure that both sides of the stepladder are locked into place. Ask someone to hold it for you, if possible. Clearly mark and make sure that you can see: Any grab bars or handrails. First and last steps. Where the edge of each step is. Use tools that help you move around (mobility aids) if they are needed. These include: Canes. Walkers. Scooters. Crutches. Turn on the lights when you go into a dark area. Replace any light bulbs as soon as they burn out. Set up your furniture so you have a clear path. Avoid moving your furniture around. If any of your floors are uneven, fix them. If there are any pets around you, be aware of where they are. Review your medicines with your doctor. Some medicines can make you feel dizzy. This can increase your chance of falling. Ask your doctor what other things that you can do to help prevent falls. This information is not intended to replace advice given to you by your health care provider. Make sure you discuss any questions you have with your health care provider. Document Released: 05/18/2009 Document Revised: 12/28/2015 Document Reviewed: 08/26/2014 Elsevier Interactive Patient Education  2017 [employer].

## 2021-06-04 ENCOUNTER — Encounter: Payer: Self-pay | Admitting: Family Medicine

## 2021-06-04 ENCOUNTER — Other Ambulatory Visit: Payer: Self-pay

## 2021-06-04 ENCOUNTER — Ambulatory Visit: Payer: Medicare PPO | Admitting: Family Medicine

## 2021-06-04 VITALS — BP 118/70 | HR 81 | Temp 97.8°F | Ht 63.0 in | Wt 122.1 lb

## 2021-06-04 DIAGNOSIS — M545 Low back pain, unspecified: Secondary | ICD-10-CM | POA: Diagnosis not present

## 2021-06-04 MED ORDER — PREDNISONE 20 MG PO TABS
20.0000 mg | ORAL_TABLET | Freq: Every day | ORAL | 0 refills | Status: AC
Start: 2021-06-04 — End: 2021-06-09

## 2021-06-04 NOTE — Progress Notes (Signed)
Acute Office Visit  Subjective:    Patient ID: [name], female    DOB: [birth year], 87 y.o.   MRN: [id number]  Chief Complaint  Patient presents with   Fall    HPI Patient is in today for a fall that occurred 3 days ago. She landed on her right side on the floor. She denies LOC or head injury. She has been having lower right sided back pain since then. The pain occurs when she moves. She does not have pain at rest. The pain is an ache. It is moderate. She has tried tylenol with some improvement. She denies numbness, tingling, or shortness of breath.   Past Medical History:  Diagnosis Date   Arthritis    AV malformation of gastrointestinal tract    in cecum she denies history of GIB   Breast mass, right    Cataract    CVA (cerebral vascular accident) ([place]) 2007   Dizziness    denies this as an ongoing problem   Hyperlipidemia    Microcytic anemia    Osteopenia    Persistent atrial fibrillation ([place])    s/p PVI 04/2010  CHADS2vasc at least 5   RLS (restless legs syndrome)    Sinus bradycardia     Past Surgical History:  Procedure Laterality Date   afib ablation  04/2010   PVI with CTI ablation performed by [facility]   BREAST EXCISIONAL BIOPSY Right    benign cyst   breast mass resected     CARDIOVERSION     for afib   CATARACT EXTRACTION W/PHACO  11/11/2011   Procedure: CATARACT EXTRACTION PHACO AND INTRAOCULAR LENS PLACEMENT ([place]);  Surgeon: Tonny Branch, MD;  Location: [facility];  Service: Ophthalmology;  Laterality: Left;  CDE:12.62   CATARACT EXTRACTION W/PHACO  11/25/2011   Procedure: CATARACT EXTRACTION PHACO AND INTRAOCULAR LENS PLACEMENT ([facility]);  Surgeon: Tonny Branch, MD;  Location: [facility];  Service: Ophthalmology;  Laterality: Right;  CDE 16.14   dental inplants      Family History  Problem Relation Age of Onset   Stroke Mother 46   Hip fracture Mother    Lung cancer Father 31   Anesthesia problems Neg Hx    Hypotension Neg Hx    Malignant hyperthermia Neg  Hx    Pseudochol deficiency Neg Hx    Breast cancer Neg Hx     Social History   Socioeconomic History   Marital status: Widowed    Spouse name: Not on file   Number of children: 0   Years of education: college   Highest education level: Not on file  Occupational History   Occupation: retired  Tobacco Use   Smoking status: Former    Packs/day: 1.00    Years: 30.00    Pack years: 30.00    Types: Cigarettes    Quit date: 08/05/1978    Years since quitting: 42.8   Smokeless tobacco: Never   Tobacco comments:    quit in 1980s  Vaping Use   Vaping Use: Never used  Substance and Sexual Activity   Alcohol use: No   Drug use: No   Sexual activity: Never  Other Topics Concern   Not on file  Social History Narrative   Patient is retired and lives at home alone. Patient has business college education. Right handed.Caffeine- some times - soda    Daily exercise at [place] walks a mile a day at home.    Church every Sunday  Social Determinants of Health   Financial Resource Strain: Low Risk    Difficulty of Paying Living Expenses: Not hard at all  Food Insecurity: No Food Insecurity   Worried About Charity fundraiser in the Last Year: Never true   [place] in the Last Year: Never true  Transportation Needs: No Transportation Needs   Lack of Transportation (Medical): No   Lack of Transportation (Non-Medical): No  Physical Activity: Sufficiently Active   Days of Exercise per Week: 6 days   Minutes of Exercise per Session: 60 min  Stress: No Stress Concern Present   Feeling of Stress : Not at all  Social Connections: Moderately Integrated   Frequency of Communication with Friends and Family: More than three times a week   Frequency of Social Gatherings with Friends and Family: More than three times a week   Attends Religious Services: More than 4 times per year   Active Member of [employer] or Organizations: Yes   Attends Archivist Meetings: More than 4  times per year   Marital Status: Widowed  Human resources officer Violence: Not At Risk   Fear of Current or Ex-Partner: No   Emotionally Abused: No   Physically Abused: No   Sexually Abused: No    Outpatient Medications Prior to Visit  Medication Sig Dispense Refill   acetaminophen (TYLENOL) 500 MG tablet Take 1-2 tablets (500-1,000 mg total) by mouth 3 (three) times daily as needed (arthritis pain). 30 tablet 0   Ascorbic Acid (VITAMIN C) 500 MG CAPS Take 500 mg by mouth daily.     atorvastatin (LIPITOR) 10 MG tablet TAKE 1/2 TABLET BY MOUTH ONCE A DAY OR AS INSTRUCTED BY PHYSICIAN 45 tablet 3   Calcium Carb-Cholecalciferol 600-800 MG-UNIT TABS Take by mouth daily.     Cholecalciferol (VITAMIN D3) 1000 UNITS CAPS Take 1 capsule by mouth daily.     diltiazem (CARDIZEM CD) 180 MG 24 hr capsule TAKE 1 CAPSULE (180 MG TOTAL) BY MOUTH DAILY. 90 capsule 3   folic acid (FOLVITE) 859 MCG tablet Take 400 mcg by mouth daily.     ipratropium (ATROVENT) 0.06 % nasal spray PLACE 1 SPRAY INTO EACH NOSTRIL TWICE A DAY AS NEEDED 135 mL 1   Magnesium 500 MG TABS Take 1 tablet by mouth daily.     Multiple Vitamin (MULTIVITAMIN) capsule Take 1 capsule by mouth daily.       Rivaroxaban (XARELTO) 15 MG TABS tablet Take 1 tablet (15 mg total) by mouth daily. 90 tablet 3   No facility-administered medications prior to visit.    No Known Allergies  Review of Systems As per HPI.    Objective:    Physical Exam Vitals and nursing note reviewed.  Constitutional:      General: She is not in acute distress.    Appearance: She is not ill-appearing, toxic-appearing or diaphoretic.  Pulmonary:     Effort: Pulmonary effort is normal. No respiratory distress.  Musculoskeletal:     Lumbar back: Tenderness (right paraspinal) present. No swelling, edema, deformity, signs of trauma or bony tenderness.  Neurological:     Mental Status: She is alert and oriented to person, place, and time.  Psychiatric:        Mood  and Affect: Mood normal.        Behavior: Behavior normal.    BP 118/70   Pulse 81   Temp 97.8 F (36.6 C) (Temporal)   Ht _0  (1.6 m)  Wt 122 lb 2 oz (55.4 kg)   BMI 21.63 kg/m  Wt Readings from Last 3 Encounters:  06/04/21 122 lb 2 oz (55.4 kg)  06/01/21 122 lb (55.3 kg)  03/07/21 122 lb (55.3 kg)    Health Maintenance Due  Topic Date Due   COVID-19 Vaccine (4 - Booster for Moderna series) 09/20/2020   Zoster Vaccines- Shingrix (2 of 2) 05/02/2021   TETANUS/TDAP  05/06/2021    There are no preventive care reminders to display for this patient.   Lab Results  Component Value Date   TSH 3.860 03/07/2021   Lab Results  Component Value Date   WBC 8.3 09/07/2020   HGB 12.4 09/07/2020   HCT 37.3 09/07/2020   MCV 94 09/07/2020   PLT 303 09/07/2020   Lab Results  Component Value Date   NA 140 03/07/2021   K 4.5 03/07/2021   CO2 23 03/07/2021   GLUCOSE 89 03/07/2021   BUN 11 03/07/2021   CREATININE 1.08 (H) 03/07/2021   BILITOT 0.4 09/07/2020   ALKPHOS 74 09/07/2020   AST 26 09/07/2020   ALT 13 09/07/2020   PROT 6.5 09/07/2020   ALBUMIN 3.9 03/07/2021   CALCIUM 9.8 03/07/2021   EGFR 49 (L) 03/07/2021   GFR 58.72 (L) 02/15/2013   Lab Results  Component Value Date   CHOL 152 09/07/2020   Lab Results  Component Value Date   HDL 66 09/07/2020   Lab Results  Component Value Date   LDLCALC 72 09/07/2020   Lab Results  Component Value Date   TRIG 70 09/07/2020   Lab Results  Component Value Date   CHOLHDL 2.3 09/07/2020   No results found for: HGBA1C     Assessment & Plan:   [name] was seen today for fall.  Diagnoses and all orders for this visit:  Acute right-sided low back pain without sciatica Prednisone burst as below. Discussed heat, ice, lidocaine patches for pain.  -     predniSONE (DELTASONE) 20 MG tablet; Take 1 tablet (20 mg total) by mouth daily with breakfast for 5 days.  Return to office for new or worsening symptoms, or if  symptoms persist.   The patient indicates understanding of these issues and agrees with the plan.  Gwenlyn Perking, FNP

## 2021-06-04 NOTE — Patient Instructions (Signed)

## 2021-06-20 ENCOUNTER — Ambulatory Visit: Payer: Medicare PPO

## 2021-06-21 ENCOUNTER — Ambulatory Visit (INDEPENDENT_AMBULATORY_CARE_PROVIDER_SITE_OTHER): Payer: Medicare PPO | Admitting: *Deleted

## 2021-06-21 ENCOUNTER — Other Ambulatory Visit: Payer: Self-pay

## 2021-06-21 DIAGNOSIS — Z23 Encounter for immunization: Secondary | ICD-10-CM

## 2021-07-06 ENCOUNTER — Ambulatory Visit: Payer: Medicare PPO | Admitting: Family Medicine

## 2021-07-06 ENCOUNTER — Other Ambulatory Visit: Payer: Self-pay

## 2021-07-06 ENCOUNTER — Encounter: Payer: Self-pay | Admitting: Family Medicine

## 2021-07-06 ENCOUNTER — Ambulatory Visit (HOSPITAL_COMMUNITY)
Admission: RE | Admit: 2021-07-06 | Discharge: 2021-07-06 | Disposition: A | Discharge status: Home / Self Care | Payer: Medicare PPO | Source: Ambulatory Visit | Attending: Family Medicine | Admitting: Family Medicine

## 2021-07-06 VITALS — BP 122/61 | HR 103 | Temp 97.7°F | Ht 63.0 in | Wt 122.2 lb

## 2021-07-06 DIAGNOSIS — S42202A Unspecified fracture of upper end of left humerus, initial encounter for closed fracture: Secondary | ICD-10-CM

## 2021-07-06 DIAGNOSIS — W19XXXA Unspecified fall, initial encounter: Secondary | ICD-10-CM | POA: Diagnosis not present

## 2021-07-06 DIAGNOSIS — M25512 Pain in left shoulder: Secondary | ICD-10-CM | POA: Insufficient documentation

## 2021-07-06 DIAGNOSIS — I700 Atherosclerosis of aorta: Secondary | ICD-10-CM | POA: Diagnosis not present

## 2021-07-06 DIAGNOSIS — R937 Abnormal findings on diagnostic imaging of other parts of musculoskeletal system: Secondary | ICD-10-CM | POA: Diagnosis not present

## 2021-07-06 NOTE — Progress Notes (Addendum)
Acute Office Visit  Subjective:    Patient ID: [name], female    DOB: 1931/05/08, 87 y.o.   MRN: [id number]  Chief Complaint  Patient presents with   Fall    HPI Patient is in today for a fall. She fell this morning at exercise class. She landed on the floor on her left shoulder. The pain only occurs with movement. The pain is a 5/10 that occurs only with movement. She is not able to lift her arm well due to the pain. She denies numbness or tingling. She denies swelling. She has not taken anything for the pain.    Past Medical History:  Diagnosis Date   Arthritis    AV malformation of gastrointestinal tract    in cecum she denies history of GIB   Breast mass, right    Cataract    CVA (cerebral vascular accident) ([place]) 2007   Dizziness    denies this as an ongoing problem   Hyperlipidemia    Microcytic anemia    Osteopenia    Persistent atrial fibrillation ([place])    s/p PVI 04/2010  CHADS2vasc at least 5   RLS (restless legs syndrome)    Sinus bradycardia     Past Surgical History:  Procedure Laterality Date   afib ablation  04/2010   PVI with CTI ablation performed by [facility]   BREAST EXCISIONAL BIOPSY Right    benign cyst   breast mass resected     CARDIOVERSION     for afib   CATARACT EXTRACTION W/PHACO  11/11/2011   Procedure: CATARACT EXTRACTION PHACO AND INTRAOCULAR LENS PLACEMENT ([place]);  Surgeon: Tonny Branch, MD;  Location: [facility];  Service: Ophthalmology;  Laterality: Left;  CDE:12.62   CATARACT EXTRACTION W/PHACO  11/25/2011   Procedure: CATARACT EXTRACTION PHACO AND INTRAOCULAR LENS PLACEMENT ([facility]);  Surgeon: Tonny Branch, MD;  Location: [facility];  Service: Ophthalmology;  Laterality: Right;  CDE 16.14   dental inplants      Family History  Problem Relation Age of Onset   Stroke Mother 50   Hip fracture Mother    Lung cancer Father 62   Anesthesia problems Neg Hx    Hypotension Neg Hx    Malignant hyperthermia Neg Hx    Pseudochol deficiency Neg  Hx    Breast cancer Neg Hx     Social History   Socioeconomic History   Marital status: Widowed    Spouse name: Not on file   Number of children: 0   Years of education: college   Highest education level: Not on file  Occupational History   Occupation: retired  Tobacco Use   Smoking status: Former    Packs/day: 1.00    Years: 30.00    Pack years: 30.00    Types: Cigarettes    Quit date: 08/05/1978    Years since quitting: 42.9   Smokeless tobacco: Never   Tobacco comments:    quit in 1980s  Vaping Use   Vaping Use: Never used  Substance and Sexual Activity   Alcohol use: No   Drug use: No   Sexual activity: Never  Other Topics Concern   Not on file  Social History Narrative   Patient is retired and lives at home alone. Patient has business college education. Right handed.Caffeine- some times - soda    Daily exercise at [employer] walks a mile a day at home.    Church every Sunday   Social Determinants of Health  Financial Resource Strain: Low Risk    Difficulty of Paying Living Expenses: Not hard at all  Food Insecurity: No Food Insecurity   Worried About Charity fundraiser in the Last Year: Never true   Ran Out of Food in the Last Year: Never true  Transportation Needs: No Transportation Needs   Lack of Transportation (Medical): No   Lack of Transportation (Non-Medical): No  Physical Activity: Sufficiently Active   Days of Exercise per Week: 6 days   Minutes of Exercise per Session: 60 min  Stress: No Stress Concern Present   Feeling of Stress : Not at all  Social Connections: Moderately Integrated   Frequency of Communication with Friends and Family: More than three times a week   Frequency of Social Gatherings with Friends and Family: More than three times a week   Attends Religious Services: More than 4 times per year   Active Member of [employer] or Organizations: Yes   Attends Archivist Meetings: More than 4 times per year   Marital Status:  Widowed  Human resources officer Violence: Not At Risk   Fear of Current or Ex-Partner: No   Emotionally Abused: No   Physically Abused: No   Sexually Abused: No    Outpatient Medications Prior to Visit  Medication Sig Dispense Refill   acetaminophen (TYLENOL) 500 MG tablet Take 1-2 tablets (500-1,000 mg total) by mouth 3 (three) times daily as needed (arthritis pain). 30 tablet 0   Ascorbic Acid (VITAMIN C) 500 MG CAPS Take 500 mg by mouth daily.     atorvastatin (LIPITOR) 10 MG tablet TAKE 1/2 TABLET BY MOUTH ONCE A DAY OR AS INSTRUCTED BY PHYSICIAN 45 tablet 3   Calcium Carb-Cholecalciferol 600-800 MG-UNIT TABS Take by mouth daily.     Cholecalciferol (VITAMIN D3) 1000 UNITS CAPS Take 1 capsule by mouth daily.     diltiazem (CARDIZEM CD) 180 MG 24 hr capsule TAKE 1 CAPSULE (180 MG TOTAL) BY MOUTH DAILY. 90 capsule 3   folic acid (FOLVITE) 154 MCG tablet Take 400 mcg by mouth daily.     ipratropium (ATROVENT) 0.06 % nasal spray PLACE 1 SPRAY INTO EACH NOSTRIL TWICE A DAY AS NEEDED 135 mL 1   Magnesium 500 MG TABS Take 1 tablet by mouth daily.     Multiple Vitamin (MULTIVITAMIN) capsule Take 1 capsule by mouth daily.       Rivaroxaban (XARELTO) 15 MG TABS tablet Take 1 tablet (15 mg total) by mouth daily. 90 tablet 3   No facility-administered medications prior to visit.    No Known Allergies  Review of Systems As per HPI.    Objective:    Physical Exam Vitals and nursing note reviewed.  Constitutional:      General: She is not in acute distress.    Appearance: She is not ill-appearing, toxic-appearing or diaphoretic.  Pulmonary:     Effort: Pulmonary effort is normal. No respiratory distress.     Breath sounds: Normal breath sounds.  Musculoskeletal:     Left shoulder: Tenderness (anterior) present. No swelling, deformity, effusion or crepitus. Decreased range of motion. Decreased strength. Normal pulse.     Left hand: Normal.     Right lower leg: No edema.     Left lower leg:  No edema.  Skin:    General: Skin is warm.  Neurological:     Mental Status: She is alert and oriented to person, place, and time.     Sensory: No sensory deficit.  Psychiatric:        Mood and Affect: Mood normal.        Behavior: Behavior normal.    BP 122/61   Pulse (!) 103   Temp 97.7 F (36.5 C) (Temporal)   Ht '5\' 3"'  (1.6 m)   Wt 122 lb 4 oz (55.5 kg)   BMI 21.66 kg/m  Wt Readings from Last 3 Encounters:  07/06/21 122 lb 4 oz (55.5 kg)  06/04/21 122 lb 2 oz (55.4 kg)  06/01/21 122 lb (55.3 kg)    Health Maintenance Due  Topic Date Due   COVID-19 Vaccine (4 - Booster for Moderna series) 09/20/2020   TETANUS/TDAP  05/06/2021    There are no preventive care reminders to display for this patient.   Lab Results  Component Value Date   TSH 3.860 03/07/2021   Lab Results  Component Value Date   WBC 8.3 09/07/2020   HGB 12.4 09/07/2020   HCT 37.3 09/07/2020   MCV 94 09/07/2020   PLT 303 09/07/2020   Lab Results  Component Value Date   NA 140 03/07/2021   K 4.5 03/07/2021   CO2 23 03/07/2021   GLUCOSE 89 03/07/2021   BUN 11 03/07/2021   CREATININE 1.08 (H) 03/07/2021   BILITOT 0.4 09/07/2020   ALKPHOS 74 09/07/2020   AST 26 09/07/2020   ALT 13 09/07/2020   PROT 6.5 09/07/2020   ALBUMIN 3.9 03/07/2021   CALCIUM 9.8 03/07/2021   EGFR 49 (L) 03/07/2021   GFR 58.72 (L) 02/15/2013   Lab Results  Component Value Date   CHOL 152 09/07/2020   Lab Results  Component Value Date   HDL 66 09/07/2020   Lab Results  Component Value Date   LDLCALC 72 09/07/2020   Lab Results  Component Value Date   TRIG 70 09/07/2020   Lab Results  Component Value Date   CHOLHDL 2.3 09/07/2020   No results found for: HGBA1C     Assessment & Plan:   [name] was seen today for fall.  Diagnoses and all orders for this visit:  Acute pain of left shoulder Fall, initial encounter Discussed concern for fracture. Advised splint, patient refused today. Discussed  rest, immobilization, and tylenol. Stat xray ordered.  -     DG Shoulder Left; Future -     CT SHOULDER LEFT WO CONTRAST; Future  Closed fracture of proximal end of left humerus, unspecified fracture morphology, initial encounter Xray reports shows possible fracture with CT recommended for further evaluation. CT ordered stat.  -     CT SHOULDER LEFT WO CONTRAST; Future  The patient indicates understanding of these issues and agrees with the plan.  Gwenlyn Perking, FNP

## 2021-07-06 NOTE — Patient Instructions (Addendum)
Shoulder Pain °Many things can cause shoulder pain, including: °An injury to the shoulder. °Overuse of the shoulder. °Arthritis. °The source of the pain can be: °Inflammation. °An injury to the shoulder joint. °An injury to a tendon, ligament, or bone. °Follow these instructions at home: °Pay attention to changes in your symptoms. Let your health care provider know about them. Follow these instructions to relieve your pain. °If you have a sling: °Wear the sling as told by your health care provider. Remove it only as told by your health care provider. °Loosen the sling if your fingers tingle, become numb, or turn cold and blue. °Keep the sling clean. °If the sling is not waterproof: °Do not let it get wet. Remove it to shower or bathe. °Move your arm as little as possible, but keep your hand moving to prevent swelling. °Managing pain, stiffness, and swelling ° °If directed, put ice on the painful area: °Put ice in a plastic bag. °Place a towel between your skin and the bag. °Leave the ice on for 20 minutes, 2-3 times per day. Stop applying ice if it does not help with the pain. °Squeeze a soft ball or a foam pad as much as possible. This helps to keep the shoulder from swelling. It also helps to strengthen the arm. °General instructions °Take over-the-counter and prescription medicines only as told by your health care provider. °Keep all follow-up visits as told by your health care provider. This is important. °Contact a health care provider if: °Your pain gets worse. °Your pain is not relieved with medicines. °New pain develops in your arm, hand, or fingers. °Get help right away if: °Your arm, hand, or fingers: °Tingle. °Become numb. °Become swollen. °Become painful. °Turn white or blue. °Summary °Shoulder pain can be caused by an injury, overuse, or arthritis. °Pay attention to changes in your symptoms. Let your health care provider know about them. °This condition may be treated with a sling, ice, and pain  medicines. °Contact your health care provider if the pain gets worse or new pain develops. Get help right away if your arm, hand, or fingers tingle or become numb, swollen, or painful. °Keep all follow-up visits as told by your health care provider. This is important. °This information is not intended to replace advice given to you by your health care provider. Make sure you discuss any questions you have with your health care provider. °Document Revised: 02/03/2018 Document Reviewed: 02/03/2018 °Elsevier Patient Education © 2022 [employer] ° °

## 2021-07-09 ENCOUNTER — Ambulatory Visit: Payer: Medicare PPO | Admitting: Family Medicine

## 2021-07-09 ENCOUNTER — Encounter: Payer: Self-pay | Admitting: Family Medicine

## 2021-07-09 VITALS — BP 108/66 | HR 60 | Temp 97.6°F | Ht 63.0 in | Wt 118.5 lb

## 2021-07-09 DIAGNOSIS — M25512 Pain in left shoulder: Secondary | ICD-10-CM | POA: Diagnosis not present

## 2021-07-09 DIAGNOSIS — W19XXXD Unspecified fall, subsequent encounter: Secondary | ICD-10-CM | POA: Diagnosis not present

## 2021-07-09 DIAGNOSIS — T148XXA Other injury of unspecified body region, initial encounter: Secondary | ICD-10-CM

## 2021-07-09 NOTE — Patient Instructions (Signed)
Hematoma A hematoma is a collection of blood under the skin, in an organ, in a body space, in a joint space, or in other tissue. The blood can thicken (clot) to form a lump that you can see and feel. The lump is often firm and may become sore and tender. Most hematomas get better in a few days to weeks. However, some hematomas may be serious and require medical care. Hematomas can range from very small to very large. What are the causes? This condition is caused by: A blunt or penetrating injury. A leakage from a blood vessel under the skin. Some medical procedures, including surgeries, such as oral surgery, face lifts, and surgeries on the joints. Some medical conditions that cause bleeding or bruising. There may be multiple hematomas that appear in different areas of the body. What increases the risk? You are more likely to develop this condition if: You are an older adult. You use blood thinners. What are the signs or symptoms? Symptoms of this condition depend on where the hematoma is located.  Common symptoms of a hematoma that is under the skin include: A firm lump on the body. Pain and tenderness in the area. Bruising. Blue, dark blue, purple-red, or yellowish skin (discoloration) may appear at the site of the hematoma if the hematoma is close to the surface of the skin. Common symptoms of a hematoma that is deep in the tissues or body spaces may be less obvious. They include: A collection of blood in the stomach (intra-abdominal hematoma). This may cause pain in the abdomen, weakness, fainting, and shortness of breath. A collection of blood in the head (intracranial hematoma). This may cause a headache or symptoms such as weakness, trouble speaking or understanding, or a change in consciousness.  How is this diagnosed? This condition is diagnosed based on: Your medical history. A physical exam. Imaging tests, such as an ultrasound or CT scan. These may be needed if your health care  provider suspects a hematoma in deeper tissues or body spaces. Blood tests. These may be needed if your health care provider believes that the hematoma is caused by a medical condition. How is this treated? Treatment for this condition depends on the cause, size, and location of the hematoma. Treatment may include: Doing nothing. The majority of hematomas do not need treatment as many of them go away on their own over time. Surgery or close monitoring. This may be needed for large hematomas or hematomas that affect vital organs. Medicines. Medicines may be given if there is an underlying medical cause for the hematoma. Follow these instructions at home: Managing pain, stiffness, and swelling  If directed, put ice on the affected area. Put ice in a plastic bag. Place a towel between your skin and the bag. Leave the ice on for 20 minutes, 2-3 times a day for the first couple of days. If directed, apply heat to the affected area after applying ice for a couple of days. Use the heat source that your health care provider recommends, such as a moist heat pack or a heating pad. Place a towel between your skin and the heat source. Leave the heat on for 20-30 minutes. Remove the heat if your skin turns bright red. This is especially important if you are unable to feel pain, heat, or cold. You may have a greater risk of getting burned. Raise (elevate) the affected area above the level of your heart while you are sitting or lying down. If told, wrap the  affected area with an elastic bandage. The bandage applies pressure (compression) to the area, which may help to reduce swelling and promote healing. Do not wrap the bandage too tightly around the affected area. If your hematoma is on a leg or foot (lower extremity) and is painful, your health care provider may recommend crutches. Use them as told by your health care provider. General instructions Take over-the-counter and prescription medicines only as  told by your health care provider. Keep all follow-up visits as told by your health care provider. This is important. Contact a health care provider if: You have a fever. The swelling or discoloration gets worse. You develop more hematomas. Get help right away if: Your pain is worse or your pain is not controlled with medicine. Your skin over the hematoma breaks or starts bleeding. Your hematoma is in your chest or abdomen and you have weakness, shortness of breath, or a change in consciousness. You have a hematoma on your scalp that is caused by a fall or injury, and you also have: A headache that gets worse. Trouble speaking or understanding speech. Weakness. Change in alertness or consciousness. Summary A hematoma is a collection of blood under the skin, in an organ, in a body space, in a joint space, or in other tissue. This condition usually does not need treatment because many hematomas go away on their own over time. Large hematomas, or those that may affect vital organs, may need surgical drainage or monitoring. If the hematoma is caused by a medical condition, medicines may be prescribed. Get help right away if your hematoma breaks or starts to bleed, you have shortness of breath, or you have a headache or trouble speaking after a fall. This information is not intended to replace advice given to you by your health care provider. Make sure you discuss any questions you have with your health care provider. Document Revised: 12/16/2018 Document Reviewed: 12/25/2017 Elsevier Patient Education  2022 [employer].

## 2021-07-09 NOTE — Progress Notes (Signed)
Established Patient Office Visit  Subjective:  Patient ID: [name], female    DOB: Jun 05, 1931  Age: 87 y.o. MRN: [id number]  CC:  Chief Complaint  Patient presents with   Shoulder Pain    HPI [name] presents for follow up of shoulder pain. She fell on 07/06/21 and landed on her shoulder. An Xray was done that day that showed a possible nondisplaced proximal humerus fracture. A CT scan was recommended and ordered but is currently under clinical review with insurance for approval. She is here with a close friend today. She reports that the pain has been stable and only occurs with movement, particularly with lifting her arm. She does now have significant bruising to her left arm. She denies numbness or tingling. She is on xalreto. She declined a sling at her visit on Friday. She has been trying to keep her arm immobile. She denies chest pain or shortness of breath.   Past Medical History:  Diagnosis Date   Arthritis    AV malformation of gastrointestinal tract    in cecum she denies history of GIB   Breast mass, right    Cataract    CVA (cerebral vascular accident) ([place]) 2007   Dizziness    denies this as an ongoing problem   Hyperlipidemia    Microcytic anemia    Osteopenia    Persistent atrial fibrillation ([place])    s/p PVI 04/2010  CHADS2vasc at least 5   RLS (restless legs syndrome)    Sinus bradycardia     Past Surgical History:  Procedure Laterality Date   afib ablation  04/2010   PVI with CTI ablation performed by [facility]   BREAST EXCISIONAL BIOPSY Right    benign cyst   breast mass resected     CARDIOVERSION     for afib   CATARACT EXTRACTION W/PHACO  11/11/2011   Procedure: CATARACT EXTRACTION PHACO AND INTRAOCULAR LENS PLACEMENT ([place]);  Surgeon: Tonny Branch, MD;  Location: [facility];  Service: Ophthalmology;  Laterality: Left;  CDE:12.62   CATARACT EXTRACTION W/PHACO  11/25/2011   Procedure: CATARACT EXTRACTION PHACO AND INTRAOCULAR LENS  PLACEMENT ([facility]);  Surgeon: Tonny Branch, MD;  Location: [facility];  Service: Ophthalmology;  Laterality: Right;  CDE 16.14   dental inplants      Family History  Problem Relation Age of Onset   Stroke Mother 32   Hip fracture Mother    Lung cancer Father 28   Anesthesia problems Neg Hx    Hypotension Neg Hx    Malignant hyperthermia Neg Hx    Pseudochol deficiency Neg Hx    Breast cancer Neg Hx     Social History   Socioeconomic History   Marital status: Widowed    Spouse name: Not on file   Number of children: 0   Years of education: college   Highest education level: Not on file  Occupational History   Occupation: retired  Tobacco Use   Smoking status: Former    Packs/day: 1.00    Years: 30.00    Pack years: 30.00    Types: Cigarettes    Quit date: 08/05/1978    Years since quitting: 42.9   Smokeless tobacco: Never   Tobacco comments:    quit in 1980s  Vaping Use   Vaping Use: Never used  Substance and Sexual Activity   Alcohol use: No   Drug use: No   Sexual activity: Never  Other Topics Concern   Not on file  Social History Narrative   Patient is retired and lives at home alone. Patient has business college education. Right handed.Caffeine- some times - soda    Daily exercise at [place] walks a mile a day at home.    Church every Sunday   Social Determinants of Health   Financial Resource Strain: Low Risk    Difficulty of Paying Living Expenses: Not hard at all  Food Insecurity: No Food Insecurity   Worried About Charity fundraiser in the Last Year: Never true   Arboriculturist in the Last Year: Never true  Transportation Needs: No Transportation Needs   Lack of Transportation (Medical): No   Lack of Transportation (Non-Medical): No  Physical Activity: Sufficiently Active   Days of Exercise per Week: 6 days   Minutes of Exercise per Session: 60 min  Stress: No Stress Concern Present   Feeling of Stress : Not at all  Social Connections: Moderately  Integrated   Frequency of Communication with Friends and Family: More than three times a week   Frequency of Social Gatherings with Friends and Family: More than three times a week   Attends Religious Services: More than 4 times per year   Active Member of [employer] or Organizations: Yes   Attends Archivist Meetings: More than 4 times per year   Marital Status: Widowed  Human resources officer Violence: Not At Risk   Fear of Current or Ex-Partner: No   Emotionally Abused: No   Physically Abused: No   Sexually Abused: No    Outpatient Medications Prior to Visit  Medication Sig Dispense Refill   acetaminophen (TYLENOL) 500 MG tablet Take 1-2 tablets (500-1,000 mg total) by mouth 3 (three) times daily as needed (arthritis pain). 30 tablet 0   Ascorbic Acid (VITAMIN C) 500 MG CAPS Take 500 mg by mouth daily.     atorvastatin (LIPITOR) 10 MG tablet TAKE 1/2 TABLET BY MOUTH ONCE A DAY OR AS INSTRUCTED BY PHYSICIAN 45 tablet 3   Calcium Carb-Cholecalciferol 600-800 MG-UNIT TABS Take by mouth daily.     Cholecalciferol (VITAMIN D3) 1000 UNITS CAPS Take 1 capsule by mouth daily.     diltiazem (CARDIZEM CD) 180 MG 24 hr capsule TAKE 1 CAPSULE (180 MG TOTAL) BY MOUTH DAILY. 90 capsule 3   folic acid (FOLVITE) 409 MCG tablet Take 400 mcg by mouth daily.     ipratropium (ATROVENT) 0.06 % nasal spray PLACE 1 SPRAY INTO EACH NOSTRIL TWICE A DAY AS NEEDED 135 mL 1   Magnesium 500 MG TABS Take 1 tablet by mouth daily.     Multiple Vitamin (MULTIVITAMIN) capsule Take 1 capsule by mouth daily.       Rivaroxaban (XARELTO) 15 MG TABS tablet Take 1 tablet (15 mg total) by mouth daily. 90 tablet 3   No facility-administered medications prior to visit.    No Known Allergies  ROS Review of Systems As per HPI.    Objective:    Physical Exam Vitals and nursing note reviewed.  Constitutional:      General: She is not in acute distress.    Appearance: She is not ill-appearing, toxic-appearing or  diaphoretic.  Pulmonary:     Effort: Pulmonary effort is normal. No respiratory distress.  Musculoskeletal:     Left shoulder: Tenderness (anterior) present. No swelling. Decreased range of motion (due to pain with abduction, adduction, and extension). Normal pulse.     Comments: Significant bruising to left upper arm and forearm.  Full ROM of forearm, wrist, and fingers. Sensation intact. Brisk cap refill. No swelling noted.   Skin:    General: Skin is warm and dry.  Neurological:     Mental Status: She is alert and oriented to person, place, and time.  Psychiatric:        Mood and Affect: Mood normal.        Behavior: Behavior normal.    BP 108/66   Pulse 60   Temp 97.6 F (36.4 C) (Temporal)   Ht _0  (1.6 m)   Wt 118 lb 8 oz (53.8 kg)   BMI 20.99 kg/m  Wt Readings from Last 3 Encounters:  07/09/21 118 lb 8 oz (53.8 kg)  07/06/21 122 lb 4 oz (55.5 kg)  06/04/21 122 lb 2 oz (55.4 kg)     Health Maintenance Due  Topic Date Due   COVID-19 Vaccine (4 - Booster for Moderna series) 09/20/2020   TETANUS/TDAP  05/06/2021    There are no preventive care reminders to display for this patient.  Lab Results  Component Value Date   TSH 3.860 03/07/2021   Lab Results  Component Value Date   WBC 8.3 09/07/2020   HGB 12.4 09/07/2020   HCT 37.3 09/07/2020   MCV 94 09/07/2020   PLT 303 09/07/2020   Lab Results  Component Value Date   NA 140 03/07/2021   K 4.5 03/07/2021   CO2 23 03/07/2021   GLUCOSE 89 03/07/2021   BUN 11 03/07/2021   CREATININE 1.08 (H) 03/07/2021   BILITOT 0.4 09/07/2020   ALKPHOS 74 09/07/2020   AST 26 09/07/2020   ALT 13 09/07/2020   PROT 6.5 09/07/2020   ALBUMIN 3.9 03/07/2021   CALCIUM 9.8 03/07/2021   EGFR 49 (L) 03/07/2021   GFR 58.72 (L) 02/15/2013   Lab Results  Component Value Date   CHOL 152 09/07/2020   Lab Results  Component Value Date   HDL 66 09/07/2020   Lab Results  Component Value Date   LDLCALC 72 09/07/2020   Lab  Results  Component Value Date   TRIG 70 09/07/2020   Lab Results  Component Value Date   CHOLHDL 2.3 09/07/2020   No results found for: HGBA1C    Assessment & Plan:   Tynesha was seen today for shoulder pain.  Diagnoses and all orders for this visit:  Acute pain of left shoulder Fall, subsequent encounter Hematoma Xray with likely proximal fracture of humerus on xray following injury 07/06/21. CT recommended on radiology report, has been ordered but is currently under clinical review for approval. Discussed case with Dr. Amedeo Kinsman with [facility] to review xray. He agrees with likely fracture with report and bruising. Sling applied today. Urgent referral to ortho placed. Discussed follow up in 1 week for repeat xray if unable to be seen by ortho before then. Discussed symptomatic care for hematoma. Return to office for new or worsening symptoms,. -     Ambulatory referral to Orthopedic Surgery  Follow-up: Return in about 1 week (around 07/16/2021) for shoulder if unable to see ortho by then.   The patient indicates understanding of these issues and agrees with the plan.  Gwenlyn Perking, FNP

## 2021-07-10 DIAGNOSIS — S42215A Unspecified nondisplaced fracture of surgical neck of left humerus, initial encounter for closed fracture: Secondary | ICD-10-CM | POA: Diagnosis not present

## 2021-07-16 ENCOUNTER — Ambulatory Visit: Payer: Medicare PPO | Admitting: Family Medicine

## 2021-07-17 DIAGNOSIS — M25512 Pain in left shoulder: Secondary | ICD-10-CM | POA: Diagnosis not present

## 2021-07-25 ENCOUNTER — Ambulatory Visit: Payer: Medicare PPO

## 2021-07-27 DIAGNOSIS — S42215A Unspecified nondisplaced fracture of surgical neck of left humerus, initial encounter for closed fracture: Secondary | ICD-10-CM | POA: Diagnosis not present

## 2021-08-01 DIAGNOSIS — I4891 Unspecified atrial fibrillation: Secondary | ICD-10-CM | POA: Diagnosis not present

## 2021-08-01 DIAGNOSIS — E785 Hyperlipidemia, unspecified: Secondary | ICD-10-CM | POA: Diagnosis not present

## 2021-08-01 DIAGNOSIS — Z87891 Personal history of nicotine dependence: Secondary | ICD-10-CM | POA: Diagnosis not present

## 2021-08-01 DIAGNOSIS — Z7901 Long term (current) use of anticoagulants: Secondary | ICD-10-CM | POA: Diagnosis not present

## 2021-08-01 DIAGNOSIS — Z823 Family history of stroke: Secondary | ICD-10-CM | POA: Diagnosis not present

## 2021-08-01 DIAGNOSIS — Z809 Family history of malignant neoplasm, unspecified: Secondary | ICD-10-CM | POA: Diagnosis not present

## 2021-08-01 DIAGNOSIS — D6869 Other thrombophilia: Secondary | ICD-10-CM | POA: Diagnosis not present

## 2021-08-10 DIAGNOSIS — S42215A Unspecified nondisplaced fracture of surgical neck of left humerus, initial encounter for closed fracture: Secondary | ICD-10-CM | POA: Diagnosis not present

## 2021-08-29 NOTE — Progress Notes (Signed)
This encounter was created in error - please disregard.

## 2021-08-31 DIAGNOSIS — S42215A Unspecified nondisplaced fracture of surgical neck of left humerus, initial encounter for closed fracture: Secondary | ICD-10-CM | POA: Diagnosis not present

## 2021-09-06 ENCOUNTER — Encounter: Payer: Self-pay | Admitting: Family Medicine

## 2021-09-06 ENCOUNTER — Ambulatory Visit (INDEPENDENT_AMBULATORY_CARE_PROVIDER_SITE_OTHER): Payer: Medicare PPO | Admitting: Family Medicine

## 2021-09-06 VITALS — BP 116/74 | HR 82 | Temp 98.1°F | Ht 63.0 in | Wt 119.8 lb

## 2021-09-06 DIAGNOSIS — Z7901 Long term (current) use of anticoagulants: Secondary | ICD-10-CM

## 2021-09-06 DIAGNOSIS — M8580 Other specified disorders of bone density and structure, unspecified site: Secondary | ICD-10-CM | POA: Diagnosis not present

## 2021-09-06 DIAGNOSIS — Z0001 Encounter for general adult medical examination with abnormal findings: Secondary | ICD-10-CM | POA: Diagnosis not present

## 2021-09-06 DIAGNOSIS — I480 Paroxysmal atrial fibrillation: Secondary | ICD-10-CM | POA: Diagnosis not present

## 2021-09-06 DIAGNOSIS — N1831 Chronic kidney disease, stage 3a: Secondary | ICD-10-CM

## 2021-09-06 DIAGNOSIS — Z0000 Encounter for general adult medical examination without abnormal findings: Secondary | ICD-10-CM

## 2021-09-06 DIAGNOSIS — E7800 Pure hypercholesterolemia, unspecified: Secondary | ICD-10-CM

## 2021-09-06 DIAGNOSIS — E559 Vitamin D deficiency, unspecified: Secondary | ICD-10-CM

## 2021-09-06 DIAGNOSIS — D692 Other nonthrombocytopenic purpura: Secondary | ICD-10-CM

## 2021-09-06 LAB — LIPID PANEL

## 2021-09-06 MED ORDER — RIVAROXABAN 15 MG PO TABS: 15.0000 mg | ORAL_TABLET | Freq: Every day | ORAL | 3 refills | Status: DC

## 2021-09-06 MED ORDER — ATORVASTATIN CALCIUM 10 MG PO TABS: ORAL_TABLET | ORAL | 3 refills | Status: DC

## 2021-09-06 MED ORDER — DILTIAZEM HCL ER COATED BEADS 180 MG PO CP24: ORAL_CAPSULE | ORAL | 3 refills | Status: DC

## 2021-09-06 NOTE — Patient Instructions (Signed)
You had labs performed today.  You will be contacted with the results of the labs once they are available, usually in the next 3 business days for routine lab work.  If you have an active my chart account, they will be released to your MyChart.  If you prefer to have these labs released to you via telephone, please let us know. ° Preventive Care 65 Years and Older, Female °Preventive care refers to lifestyle choices and visits with your health care provider that can promote health and wellness. Preventive care visits are also called wellness exams. °What can I expect for my preventive care visit? °Counseling °Your health care provider may ask you questions about your: °Medical history, including: °Past medical problems. °Family medical history. °Pregnancy and menstrual history. °History of falls. °Current health, including: °Memory and ability to understand (cognition). °Emotional well-being. °Home life and relationship well-being. °Sexual activity and sexual health. °Lifestyle, including: °Alcohol, nicotine or tobacco, and drug use. °Access to firearms. °Diet, exercise, and sleep habits. °Work and work environment. °Sunscreen use. °Safety issues such as seatbelt and bike helmet use. °Physical exam °Your health care provider will check your: °Height and weight. These may be used to calculate your BMI (body mass index). BMI is a measurement that tells if you are at a healthy weight. °Waist circumference. This measures the distance around your waistline. This measurement also tells if you are at a healthy weight and may help predict your risk of certain diseases, such as type 2 diabetes and high blood pressure. °Heart rate and blood pressure. °Body temperature. °Skin for abnormal spots. °What immunizations do I need? °Vaccines are usually given at various ages, according to a schedule. Your health care provider will recommend vaccines for you based on your age, medical history, and lifestyle or other factors, such as  travel or where you work. °What tests do I need? °Screening °Your health care provider may recommend screening tests for certain conditions. This may include: °Lipid and cholesterol levels. °Hepatitis C test. °Hepatitis B test. °HIV (human immunodeficiency virus) test. °STI (sexually transmitted infection) testing, if you are at risk. °Lung cancer screening. °Colorectal cancer screening. °Diabetes screening. This is done by checking your blood sugar (glucose) after you have not eaten for a while (fasting). °Mammogram. Talk with your health care provider about how often you should have regular mammograms. °BRCA-related cancer screening. This may be done if you have a family history of breast, ovarian, tubal, or peritoneal cancers. °Bone density scan. This is done to screen for osteoporosis. °Talk with your health care provider about your test results, treatment options, and if necessary, the need for more tests. °Follow these instructions at home: °Eating and drinking ° °Eat a diet that includes fresh fruits and vegetables, whole grains, lean protein, and low-fat dairy products. Limit your intake of foods with high amounts of sugar, saturated fats, and salt. °Take vitamin and mineral supplements as recommended by your health care provider. °Do not drink alcohol if your health care provider tells you not to drink. °If you drink alcohol: °Limit how much you have to 0-1 drink a day. °Know how much alcohol is in your drink. In the [place] one drink equals one 12 oz bottle of beer (355 mL), one 5 oz glass of wine (148 mL), or one 1½ oz glass of hard liquor (44 mL). °Lifestyle °Brush your teeth every morning and night with fluoride toothpaste. Floss one time each day. °Exercise for at least 30 minutes 5 or more days each   week. °Do not use any products that contain nicotine or tobacco. These products include cigarettes, chewing tobacco, and vaping devices, such as e-cigarettes. If you need help quitting, ask your health care  provider. °Do not use drugs. °If you are sexually active, practice safe sex. Use a condom or other form of protection in order to prevent STIs. °Take aspirin only as told by your health care provider. Make sure that you understand how much to take and what form to take. Work with your health care provider to find out whether it is safe and beneficial for you to take aspirin daily. °Ask your health care provider if you need to take a cholesterol-lowering medicine (statin). °Find healthy ways to manage stress, such as: °Meditation, yoga, or listening to music. °Journaling. °Talking to a trusted person. °Spending time with friends and family. °Minimize exposure to UV radiation to reduce your risk of skin cancer. °Safety °Always wear your seat belt while driving or riding in a vehicle. °Do not drive: °If you have been drinking alcohol. Do not ride with someone who has been drinking. °When you are tired or distracted. °While texting. °If you have been using any mind-altering substances or drugs. °Wear a helmet and other protective equipment during sports activities. °If you have firearms in your house, make sure you follow all gun safety procedures. °What's next? °Visit your health care provider once a year for an annual wellness visit. °Ask your health care provider how often you should have your eyes and teeth checked. °Stay up to date on all vaccines. °This information is not intended to replace advice given to you by your health care provider. Make sure you discuss any questions you have with your health care provider. °Document Revised: 01/17/2021 Document Reviewed: 01/17/2021 °Elsevier Patient Education © 2022 Elsevier Inc. ° ° °

## 2021-09-06 NOTE — Progress Notes (Signed)
[name] is a 88 y.o. female presents to office today for annual physical exam examination.    Concerns today include: 1. none  Occupation: retired, Marital status: single, resides alone, Substance use: none Diet: balanced, Exercise: Very active.  She takes an exercise class II times per week and walks the rest of the days Last eye exam: Needs Last dental exam: N/A.  Wears dentures Last colonoscopy: Up-to-date Last mammogram: Up-to-date Last pap smear: N/A Refills needed today: All Immunizations needed: Immunization History  Administered Date(s) Administered   Influenza, High Dose Seasonal PF 05/07/2017, 06/26/2018   Influenza,inj,Quad PF,6+ Mos 07/27/2013, 05/19/2014, 08/24/2015, 05/07/2016, 05/27/2019   Influenza,inj,quad, With Preservative 04/07/2017   Influenza-Unspecified 05/24/2021   Moderna Sars-Covid-2 Vaccination 09/30/2019, 10/29/2019, 07/26/2020   Pneumococcal Conjugate-13 10/07/2013   Pneumococcal Polysaccharide-23 03/09/2019   Tdap 05/07/2011   Zoster Recombinat (Shingrix) 03/07/2021, 06/21/2021   Zoster, Live 09/30/2014     Past Medical History:  Diagnosis Date   Arthritis    AV malformation of gastrointestinal tract    in cecum she denies history of GIB   Breast mass, right    Cataract    CVA (cerebral vascular accident) ([place]) 2007   Dizziness    denies this as an ongoing problem   Hyperlipidemia    Microcytic anemia    Osteopenia    Persistent atrial fibrillation ([place])    s/p PVI 04/2010  CHADS2vasc at least 5   RLS (restless legs syndrome)    Sinus bradycardia    Social History   Socioeconomic History   Marital status: Widowed    Spouse name: Not on file   Number of children: 0   Years of education: college   Highest education level: Not on file  Occupational History   Occupation: retired  Tobacco Use   Smoking status: Former    Packs/day: 1.00    Years: 30.00    Pack years: 30.00    Types: Cigarettes    Quit date:  08/05/1978    Years since quitting: 43.1   Smokeless tobacco: Never   Tobacco comments:    quit in 1980s  Vaping Use   Vaping Use: Never used  Substance and Sexual Activity   Alcohol use: No   Drug use: No   Sexual activity: Never  Other Topics Concern   Not on file  Social History Narrative   Patient is retired and lives at home alone.    Patient has business college education. Right handed.    Caffeine- some times - soda    Daily exercise at [place] walks a mile a day at home.    Church every Sunday   Likes to go on trips with [place]   Wears hearing aids/ dentures   Social Determinants of Radio broadcast assistant Strain: Low Risk    Difficulty of Paying Living Expenses: Not hard at all  Food Insecurity: No Food Insecurity   Worried About Charity fundraiser in the Last Year: Never true   Arboriculturist in the Last Year: Never true  Transportation Needs: No Transportation Needs   Lack of Transportation (Medical): No   Lack of Transportation (Non-Medical): No  Physical Activity: Sufficiently Active   Days of Exercise per Week: 6 days   Minutes of Exercise per Session: 60 min  Stress: No Stress Concern Present   Feeling of Stress : Not at all  Social Connections: Moderately Integrated   Frequency of Communication with Friends  and Family: More than three times a week   Frequency of Social Gatherings with Friends and Family: More than three times a week   Attends Religious Services: More than 4 times per year   Active Member of Clubs or Organizations: Yes   Attends Archivist Meetings: More than 4 times per year   Marital Status: Widowed  Intimate Partner Violence: Not At Risk   Fear of Current or Ex-Partner: No   Emotionally Abused: No   Physically Abused: No   Sexually Abused: No   Past Surgical History:  Procedure Laterality Date   afib ablation  04/2010   PVI with CTI ablation performed by [facility]   BREAST EXCISIONAL BIOPSY Right    benign  cyst   breast mass resected     CARDIOVERSION     for afib   CATARACT EXTRACTION W/PHACO  11/11/2011   Procedure: CATARACT EXTRACTION PHACO AND INTRAOCULAR LENS PLACEMENT ([place]);  Surgeon: Tonny Branch, MD;  Location: [facility];  Service: Ophthalmology;  Laterality: Left;  CDE:12.62   CATARACT EXTRACTION W/PHACO  11/25/2011   Procedure: CATARACT EXTRACTION PHACO AND INTRAOCULAR LENS PLACEMENT ([facility]);  Surgeon: Tonny Branch, MD;  Location: [facility];  Service: Ophthalmology;  Laterality: Right;  CDE 16.14   dental inplants     Family History  Problem Relation Age of Onset   Stroke Mother 82   Hip fracture Mother    Lung cancer Father 22   Anesthesia problems Neg Hx    Hypotension Neg Hx    Malignant hyperthermia Neg Hx    Pseudochol deficiency Neg Hx    Breast cancer Neg Hx     Current Outpatient Medications:    acetaminophen (TYLENOL) 500 MG tablet, Take 1-2 tablets (500-1,000 mg total) by mouth 3 (three) times daily as needed (arthritis pain)., Disp: 30 tablet, Rfl: 0   Ascorbic Acid (VITAMIN C) 500 MG CAPS, Take 500 mg by mouth daily., Disp: , Rfl:    atorvastatin (LIPITOR) 10 MG tablet, TAKE 1/2 TABLET BY MOUTH ONCE A DAY OR AS INSTRUCTED BY PHYSICIAN, Disp: 45 tablet, Rfl: 3   Calcium Carb-Cholecalciferol 600-800 MG-UNIT TABS, Take by mouth daily., Disp: , Rfl:    Cholecalciferol (VITAMIN D3) 1000 UNITS CAPS, Take 1 capsule by mouth daily., Disp: , Rfl:    diltiazem (CARDIZEM CD) 180 MG 24 hr capsule, TAKE 1 CAPSULE (180 MG TOTAL) BY MOUTH DAILY., Disp: 90 capsule, Rfl: 3   folic acid (FOLVITE) 106 MCG tablet, Take 400 mcg by mouth daily., Disp: , Rfl:    ipratropium (ATROVENT) 0.06 % nasal spray, PLACE 1 SPRAY INTO EACH NOSTRIL TWICE A DAY AS NEEDED, Disp: 135 mL, Rfl: 1   Magnesium 500 MG TABS, Take 1 tablet by mouth daily., Disp: , Rfl:    Multiple Vitamin (MULTIVITAMIN) capsule, Take 1 capsule by mouth daily.  , Disp: , Rfl:    Rivaroxaban (XARELTO) 15 MG TABS tablet, Take 1 tablet (15 mg  total) by mouth daily., Disp: 90 tablet, Rfl: 3  No Known Allergies   ROS: Review of Systems A comprehensive review of systems was negative.    Physical exam BP 116/74    Pulse 82    Temp 98.1 F (36.7 C)    Ht '5\' 3"'  (1.6 m)    Wt 119 lb 12.8 oz (54.3 kg)    SpO2 98%    BMI 21.22 kg/m  General appearance: alert, cooperative, appears stated age, and no distress Head: Normocephalic, without obvious abnormality, atraumatic  Eyes: negative findings: lids and lashes normal, conjunctivae and sclerae normal, corneas clear, and pupils equal, round, reactive to light and accomodation Ears: normal TM's and external ear canals both ears Nose: Nares normal. Septum midline. Mucosa normal. No drainage or sinus tenderness. Throat:  Oropharynx without masses.  No erythema.  Dentures in place Neck: no adenopathy, no carotid bruit, supple, symmetrical, trachea midline, and thyroid not enlarged, symmetric, no tenderness/mass/nodules Back: symmetric, no curvature. ROM normal. No CVA tenderness. Lungs: clear to auscultation bilaterally Heart:  Irregularly irregular with rate controlled.  No murmurs. Abdomen: soft, non-tender; bowel sounds normal; no masses,  no organomegaly Extremities: extremities normal, atraumatic, no cyanosis or edema Pulses: 2+ and symmetric Skin:  Senile purpura noted Lymph nodes: Cervical, supraclavicular, and axillary nodes normal. Neurologic: Grossly normal Psych mood stable, speech normal, affect appropriate   Assessment/ Plan: Sharlyn Bologna [name] here for annual physical exam.   Annual physical exam  Paroxysmal atrial fibrillation (HCC) - Plan: TSH, EKG 12-Lead, diltiazem (CARDIZEM CD) 180 MG 24 hr capsule, Rivaroxaban (XARELTO) 15 MG TABS tablet  Purpura senilis (HCC)  Chronic anticoagulation - Plan: CBC  Stage 3a chronic kidney disease ([place]) - Plan: CMP14+EGFR  Pure hypercholesterolemia - Plan: CMP14+EGFR, Lipid Panel, TSH, atorvastatin (LIPITOR) 10 MG  tablet  Vitamin D deficiency - Plan: VITAMIN D 25 Hydroxy (Vit-D Deficiency, Fractures)  Low bone mass - Plan: CMP14+EGFR, VITAMIN D 25 Hydroxy (Vit-D Deficiency, Fractures)  Her heart rate is irregular but rate controlled.  Continue diltiazem, Xarelto.  EKG was updated today and was unchanged from previous EKG  Progressive nail is likely secondary to chronic anticoagulation.  Check CBC  Check renal function panel given known CKD 3A.  Continue adequate hydration and exercise  Check CMP, fasting lipid panel, TSH.  Lipitor ordered  Check vitamin D level given known low bone mass and history of vitamin D deficiency  May follow-up in 6 to 12 months or sooner if needed  Dearion Huot M. Lajuana Ripple, DO

## 2021-09-07 LAB — LIPID PANEL
Chol/HDL Ratio: 2.6 ratio (ref 0.0–4.4)
Cholesterol, Total: 165 mg/dL (ref 100–199)
HDL: 64 mg/dL (ref >39)
LDL Chol Calc (NIH): 85 mg/dL (ref 0–99)
Triglycerides: 84 mg/dL (ref 0–149)
VLDL Cholesterol Cal: 16 mg/dL (ref 5–40)

## 2021-09-07 LAB — CMP14+EGFR
ALT: 14 IU/L (ref 0–32)
AST: 27 IU/L (ref 0–40)
Albumin/Globulin Ratio: 1.6 (ref 1.2–2.2)
Albumin: 3.9 g/dL (ref 3.6–4.6)
Alkaline Phosphatase: 86 IU/L (ref 44–121)
BUN/Creatinine Ratio: 17 (ref 12–28)
BUN: 15 mg/dL (ref 8–27)
Bilirubin Total: 0.5 mg/dL (ref 0.0–1.2)
CO2: 22 mmol/L (ref 20–29)
Calcium: 10 mg/dL (ref 8.7–10.3)
Chloride: 103 mmol/L (ref 96–106)
Creatinine, Ser: 0.88 mg/dL (ref 0.57–1.00)
Globulin, Total: 2.5 g/dL (ref 1.5–4.5)
Glucose: 85 mg/dL (ref 70–99)
Potassium: 4.8 mmol/L (ref 3.5–5.2)
Sodium: 139 mmol/L (ref 134–144)
Total Protein: 6.4 g/dL (ref 6.0–8.5)
eGFR: 63 mL/min/{1.73_m2} (ref >59)

## 2021-09-07 LAB — TSH: TSH: 3.42 u[IU]/mL (ref 0.450–4.500)

## 2021-09-07 LAB — CBC
Hematocrit: 39 % (ref 34.0–46.6)
Hemoglobin: 12.8 g/dL (ref 11.1–15.9)
MCH: 31.1 pg (ref 26.6–33.0)
MCHC: 32.8 g/dL (ref 31.5–35.7)
MCV: 95 fL (ref 79–97)
Platelets: 286 10*3/uL (ref 150–450)
RBC: 4.11 x10E6/uL (ref 3.77–5.28)
RDW: 12.1 % (ref 11.7–15.4)
WBC: 7 10*3/uL (ref 3.4–10.8)

## 2021-09-07 LAB — VITAMIN D 25 HYDROXY (VIT D DEFICIENCY, FRACTURES): Vit D, 25-Hydroxy: 57.1 ng/mL (ref 30.0–100.0)

## 2021-09-10 NOTE — Progress Notes (Signed)
Patient returning call.

## 2022-02-11 NOTE — Progress Notes (Unsigned)
Cardiology Office Note   Date:  02/13/2022   ID:  [name], DOB 11/28/1930, MRN [id number]  PCP:  Janora [name], DO  Cardiologist:   Minus Breeding, MD   Chief Complaint  Patient presents with   Atrial Fibrillation      History of Present Illness: [name] is a 88 y.o. female who presents for follow up of atrial fib.   She has chronic atrial fib. Since I last saw her she has done very well.  She still lives alone.  She exercises 3 days a week doing a class in [place].  The patient denies any new symptoms such as chest discomfort, neck or arm discomfort. There has been no new shortness of breath, PND or orthopnea. There have been no reported palpitations, presyncope or syncope.  She rarely notices that she is in atrial fibrillation.   Past Medical History:  Diagnosis Date   Arthritis    AV malformation of gastrointestinal tract    in cecum she denies history of GIB   Breast mass, right    Cataract    CVA (cerebral vascular accident) ([place]) 2007   Dizziness    denies this as an ongoing problem   Hyperlipidemia    Microcytic anemia    Osteopenia    Persistent atrial fibrillation ([place])    s/p PVI 04/2010  CHADS2vasc at least 5   RLS (restless legs syndrome)    Sinus bradycardia     Past Surgical History:  Procedure Laterality Date   afib ablation  04/2010   PVI with CTI ablation performed by [facility]   BREAST EXCISIONAL BIOPSY Right    benign cyst   breast mass resected     CARDIOVERSION     for afib   CATARACT EXTRACTION W/PHACO  11/11/2011   Procedure: CATARACT EXTRACTION PHACO AND INTRAOCULAR LENS PLACEMENT ([place]);  Surgeon: Tonny Branch, MD;  Location: [facility];  Service: Ophthalmology;  Laterality: Left;  CDE:12.62   CATARACT EXTRACTION W/PHACO  11/25/2011   Procedure: CATARACT EXTRACTION PHACO AND INTRAOCULAR LENS PLACEMENT ([facility]);  Surgeon: Tonny Branch, MD;  Location: [facility];  Service: Ophthalmology;  Laterality: Right;  CDE 16.14   dental  inplants       Current Outpatient Medications  Medication Sig Dispense Refill   acetaminophen (TYLENOL) 500 MG tablet Take 1-2 tablets (500-1,000 mg total) by mouth 3 (three) times daily as needed (arthritis pain). 30 tablet 0   Ascorbic Acid (VITAMIN C) 500 MG CAPS Take 500 mg by mouth daily.     atorvastatin (LIPITOR) 10 MG tablet TAKE 1/2 TABLET BY MOUTH ONCE A DAY OR AS INSTRUCTED BY PHYSICIAN 45 tablet 3   Calcium Carb-Cholecalciferol 600-800 MG-UNIT TABS Take by mouth daily.     Cholecalciferol (VITAMIN D3) 1000 UNITS CAPS Take 1 capsule by mouth daily.     diltiazem (CARDIZEM CD) 180 MG 24 hr capsule TAKE 1 CAPSULE (180 MG TOTAL) BY MOUTH DAILY. 90 capsule 3   folic acid (FOLVITE) 579 MCG tablet Take 400 mcg by mouth daily.     Magnesium 500 MG TABS Take 1 tablet by mouth daily.     Multiple Vitamin (MULTIVITAMIN) capsule Take 1 capsule by mouth daily.       Rivaroxaban (XARELTO) 15 MG TABS tablet Take 1 tablet (15 mg total) by mouth daily. 90 tablet 3   No current facility-administered medications for this visit.    Allergies:   Patient has no known allergies.  ROS:  Please see the history of present illness.   Otherwise, review of systems are positive for none.   All other systems are reviewed and negative.    PHYSICAL EXAM: VS:  BP 108/70   Pulse 94   Ht '5\' 3"'$  (1.6 m)   Wt 124 lb (56.2 kg)   BMI 21.97 kg/m  , BMI Body mass index is 21.97 kg/m. GENERAL:  Well appearing NECK:  No jugular venous distention, waveform within normal limits, carotid upstroke brisk and symmetric, no bruits, no thyromegaly LUNGS:  Clear to auscultation bilaterally CHEST:  Unremarkable HEART:  PMI not displaced or sustained,S1 and S2 within normal limits, no S3, no clicks, no rubs, no murmurs, irregular ABD:  Flat, positive bowel sounds normal in frequency in pitch, no bruits, no rebound, no guarding, no midline pulsatile mass, no hepatomegaly, no splenomegaly EXT:  2 plus pulses throughout,  no edema, no cyanosis no clubbing   EKG:  EKG is  ordered today. The ekg ordered today demonstrates atrial fibrillation, rate 94,  axis within normal limits, intervals within normal limits, poor anterior R wave progression, no acute ST-T wave changes.   Recent Labs: 09/06/2021: ALT 14; BUN 15; Creatinine, Ser 0.88; Hemoglobin 12.8; Platelets 286; Potassium 4.8; Sodium 139; TSH 3.420    Lipid Panel    Component Value Date/Time   CHOL 165 09/06/2021 0936   CHOL 140 07/02/2013 1013   TRIG 84 09/06/2021 0936   TRIG 73 04/19/2016 0815   TRIG 71 07/02/2013 1013   HDL 64 09/06/2021 0936   HDL 62 04/19/2016 0815   HDL 61 07/02/2013 1013   CHOLHDL 2.6 09/06/2021 0936   LDLCALC 85 09/06/2021 0936   LDLCALC 76 01/17/2014 0914   LDLCALC 65 07/02/2013 1013      Wt Readings from Last 3 Encounters:  02/13/22 124 lb (56.2 kg)  09/06/21 119 lb 12.8 oz (54.3 kg)  07/09/21 118 lb 8 oz (53.8 kg)      Other studies Reviewed: Additional studies/ records that were reviewed today include: Labs Review of the above records demonstrates:  See elsewhere   ASSESSMENT AND PLAN:  Permanent atrial fibrillation:    [name] has a CHA2DS2 - VASc score of 5.  She tolerates atrial fibrillation.  She tolerates anticoagulation.  She has borderline for the higher dose of Xarelto but given fluctuating creatinines in the past and would leave her on a lower dose.  We have had this discussion.  I talked to her about monitoring her heart rate at home but this would cause her anxiety so she would choose not to do this.  No change in therapy.     Current medicines are reviewed at length with the patient today.  The patient does not have concerns regarding medicines.  The following changes have been made: None  Labs/ tests ordered today include: None  Orders Placed This Encounter  Procedures   EKG 12-Lead     Disposition:   FU with me in 12 months.     Signed, Minus Breeding, MD   02/13/2022 11:48 AM    [facility]

## 2022-02-13 ENCOUNTER — Encounter: Payer: Self-pay | Admitting: Cardiology

## 2022-02-13 ENCOUNTER — Ambulatory Visit: Payer: Medicare PPO | Admitting: Cardiology

## 2022-02-13 VITALS — BP 108/70 | HR 94 | Ht 63.0 in | Wt 124.0 lb

## 2022-02-13 DIAGNOSIS — I4821 Permanent atrial fibrillation: Secondary | ICD-10-CM | POA: Diagnosis not present

## 2022-02-13 NOTE — Patient Instructions (Signed)

## 2022-03-06 ENCOUNTER — Ambulatory Visit: Payer: Medicare PPO | Admitting: Family Medicine

## 2022-03-08 ENCOUNTER — Encounter: Payer: Self-pay | Admitting: Family Medicine

## 2022-03-08 ENCOUNTER — Ambulatory Visit: Payer: Medicare PPO | Admitting: Family Medicine

## 2022-03-08 VITALS — BP 137/82 | HR 99 | Temp 97.9°F | Ht 63.0 in | Wt 125.4 lb

## 2022-03-08 DIAGNOSIS — I480 Paroxysmal atrial fibrillation: Secondary | ICD-10-CM | POA: Diagnosis not present

## 2022-03-08 DIAGNOSIS — N1831 Chronic kidney disease, stage 3a: Secondary | ICD-10-CM | POA: Diagnosis not present

## 2022-03-08 NOTE — Progress Notes (Signed)
Subjective: CC: Checkup PCP: Janora [name], DO XAJ:OINOMVE F Lisa is a 88 y.o. female presenting to clinic today for:  1.  Atrial fibrillation Compliant with her medications.  No bleeding episodes reported.  Not having any problems with affordability of Xarelto.  She occasionally does have a little fluttering in her chest but this does not last long nor is it associated with tachycardia.  Denies any chest pain, shortness of breath or dizziness.  Overall she has been feeling well and has continued to [place] rec [place].  She enjoys dancing   ROS: Per HPI  No Known Allergies Past Medical History:  Diagnosis Date   Arthritis    AV malformation of gastrointestinal tract    in cecum she denies history of GIB   Breast mass, right    Cataract    CVA (cerebral vascular accident) ([place]) 2007   Dizziness    denies this as an ongoing problem   Hyperlipidemia    Microcytic anemia    Osteopenia    Persistent atrial fibrillation ([facility])    s/p PVI 04/2010  CHADS2vasc at least 5   RLS (restless legs syndrome)    Sinus bradycardia     Current Outpatient Medications:    acetaminophen (TYLENOL) 500 MG tablet, Take 1-2 tablets (500-1,000 mg total) by mouth 3 (three) times daily as needed (arthritis pain)., Disp: 30 tablet, Rfl: 0   Ascorbic Acid (VITAMIN C) 500 MG CAPS, Take 500 mg by mouth daily., Disp: , Rfl:    atorvastatin (LIPITOR) 10 MG tablet, TAKE 1/2 TABLET BY MOUTH ONCE A DAY OR AS INSTRUCTED BY PHYSICIAN, Disp: 45 tablet, Rfl: 3   Calcium Carb-Cholecalciferol 600-800 MG-UNIT TABS, Take by mouth daily., Disp: , Rfl:    Cholecalciferol (VITAMIN D3) 1000 UNITS CAPS, Take 1 capsule by mouth daily., Disp: , Rfl:    diltiazem (CARDIZEM CD) 180 MG 24 hr capsule, TAKE 1 CAPSULE (180 MG TOTAL) BY MOUTH DAILY., Disp: 90 capsule, Rfl: 3   folic acid (FOLVITE) 720 MCG tablet, Take 400 mcg by mouth daily., Disp: , Rfl:    Magnesium 500 MG TABS, Take 1 tablet by  mouth daily., Disp: , Rfl:    Multiple Vitamin (MULTIVITAMIN) capsule, Take 1 capsule by mouth daily.  , Disp: , Rfl:    Rivaroxaban (XARELTO) 15 MG TABS tablet, Take 1 tablet (15 mg total) by mouth daily., Disp: 90 tablet, Rfl: 3 Social History   Socioeconomic History   Marital status: Widowed    Spouse name: Not on file   Number of children: 0   Years of education: college   Highest education level: Not on file  Occupational History   Occupation: retired  Tobacco Use   Smoking status: Former    Packs/day: 1.00    Years: 30.00    Total pack years: 30.00    Types: Cigarettes    Quit date: 08/05/1978    Years since quitting: 43.6   Smokeless tobacco: Never   Tobacco comments:    quit in 1980s  Vaping Use   Vaping Use: Never used  Substance and Sexual Activity   Alcohol use: No   Drug use: No   Sexual activity: Never  Other Topics Concern   Not on file  Social History Narrative   Patient is retired and lives at home alone.    Patient has business college education. Right handed.    Caffeine- some times - soda    Daily exercise at [facility] -  walks a mile a day at home.    Church every Sunday   Likes to go on trips with the rec center   Wears hearing aids/ dentures   Social Determinants of Health   Financial Resource Strain: Low Risk  (06/01/2021)   Overall Financial Resource Strain (CARDIA)    Difficulty of Paying Living Expenses: Not hard at all  Food Insecurity: No Food Insecurity (06/01/2021)   Hunger Vital Sign    Worried About Running Out of Food in the Last Year: Never true    Ran Out of Food in the Last Year: Never true  Transportation Needs: No Transportation Needs (06/01/2021)   PRAPARE - Hydrologist (Medical): No    Lack of Transportation (Non-Medical): No  Physical Activity: Sufficiently Active (06/01/2021)   Exercise Vital Sign    Days of Exercise per Week: 6 days    Minutes of Exercise per Session: 60 min  Stress: No  Stress Concern Present (06/01/2021)   [place]    Feeling of Stress : Not at all  Social Connections: Moderately Integrated (06/01/2021)   Social Connection and Isolation Panel [NHANES]    Frequency of Communication with Friends and Family: More than three times a week    Frequency of Social Gatherings with Friends and Family: More than three times a week    Attends Religious Services: More than 4 times per year    Active Member of [employer] or Organizations: Yes    Attends Archivist Meetings: More than 4 times per year    Marital Status: Widowed  Intimate Partner Violence: Not At Risk (06/01/2021)   Humiliation, Afraid, Rape, and Kick questionnaire    Fear of Current or Ex-Partner: No    Emotionally Abused: No    Physically Abused: No    Sexually Abused: No   Family History  Problem Relation Age of Onset   Stroke Mother 35   Hip fracture Mother    Lung cancer Father 70   Anesthesia problems Neg Hx    Hypotension Neg Hx    Malignant hyperthermia Neg Hx    Pseudochol deficiency Neg Hx    Breast cancer Neg Hx     Objective: Office vital signs reviewed. BP 137/82   Pulse 99   Temp 97.9 F (36.6 C)   Ht '5\' 3"'$  (1.6 m)   Wt 125 lb 6.4 oz (56.9 kg)   SpO2 95%   BMI 22.21 kg/m   Physical Examination:  General: Awake, alert, well nourished, No acute distress HEENT: sclera white, MMM. Cardio: regular rate and rhythm, S1S2 heard, no murmurs appreciated Pulm: clear to auscultation bilaterally, no wheezes, rhonchi or rales; normal work of breathing on room air     03/08/2022    2:15 PM 04/13/2018    9:15 AM 05/07/2016   10:41 AM  MMSE - Mini Mental State Exam  Orientation to time '3 5 5  '$ Orientation to Place '5 5 5  '$ Registration '3 3 3  '$ Attention/ Calculation '5 5 5  '$ Recall '1 2 3  '$ Language- name 2 objects '2 2 2  '$ Language- repeat '1 1 1  '$ Language- follow 3 step command '3 3 3  '$ Language- read & follow  direction '1 1 1  '$ Write a sentence '1 1 1  '$ Copy design 0 1 1  Total score '25 29 30   '$ Assessment/ Plan: 88 y.o. female   Paroxysmal atrial fibrillation ([place]) - Plan:  Hemoglobin, fingerstick  Stage 3a chronic kidney disease ([place])  Rate and rhythm controlled A-fib today.  Fingerstick hemoglobin collected but no reports of bleeding or other concerning symptoms or signs.  Did not wish to have venous stick today.  Additionally, memory testing was performed since we have not done this since 2019.  There was a slight drop but still within acceptable range and NOT consistent with dementia  May follow-up in 6 months for annual physical with fasting labs, sooner if concerns arise  No orders of the defined types were placed in this encounter.  No orders of the defined types were placed in this encounter.    Janora [name], DO [place] [phone]

## 2022-05-07 ENCOUNTER — Encounter: Payer: Self-pay | Admitting: *Deleted

## 2022-06-21 ENCOUNTER — Ambulatory Visit (INDEPENDENT_AMBULATORY_CARE_PROVIDER_SITE_OTHER): Payer: Medicare PPO

## 2022-06-21 VITALS — Ht 63.0 in | Wt 125.0 lb

## 2022-06-21 DIAGNOSIS — Z0000 Encounter for general adult medical examination without abnormal findings: Secondary | ICD-10-CM | POA: Diagnosis not present

## 2022-06-21 NOTE — Progress Notes (Signed)
Subjective:   [name] is a 88 y.o. female who presents for Medicare Annual (Subsequent) preventive examination.  I connected with  [name] on 06/21/22 by a audio enabled telemedicine application and verified that I am speaking with the correct person using two identifiers.  Patient Location: Home  Provider Location: Office/Clinic  I discussed the limitations of evaluation and management by telemedicine. The patient expressed understanding and agreed to proceed.  Review of Systems    Cardiac Risk Factors include: advanced age (>14mn, >>72women);dyslipidemia     Objective:    Today's Vitals   06/21/22 1250  Weight: 125 lb (56.7 kg)  Height: '5\' 3"'$  (1.6 m)   Body mass index is 22.14 kg/m.     06/21/2022   12:55 PM 06/01/2021    2:09 PM 04/13/2018    9:45 AM 05/07/2016   10:18 AM 02/17/2013    8:14 AM 11/06/2011    2:00 PM  Advanced Directives  Does Patient Have a Medical Advance Directive? Yes Yes Yes Yes Patient has advance directive, copy not in chart Patient has advance directive, copy not in chart  Type of Advance Directive [name] [name] [name] [name]  [name]  Does patient want to make changes to medical advance directive? No - Patient declined  No - Patient declined No - Patient declined    Copy of [place] Chart? No - copy requested No - copy requested No - copy requested No - copy requested  Copy requested from family    Current Medications (verified) Outpatient Encounter Medications as of 06/21/2022  Medication Sig   acetaminophen (TYLENOL) 500 MG tablet Take 1-2 tablets (500-1,000 mg total) by mouth 3 (three) times daily as needed (arthritis pain).   Ascorbic Acid (VITAMIN C) 500 MG CAPS Take 500 mg by mouth daily.   atorvastatin (LIPITOR) 10 MG  tablet TAKE 1/2 TABLET BY MOUTH ONCE A DAY OR AS INSTRUCTED BY PHYSICIAN   Calcium Carb-Cholecalciferol 600-800 MG-UNIT TABS Take by mouth daily.   Cholecalciferol (VITAMIN D3) 1000 UNITS CAPS Take 1 capsule by mouth daily.   diltiazem (CARDIZEM CD) 180 MG 24 hr capsule TAKE 1 CAPSULE (180 MG TOTAL) BY MOUTH DAILY.   folic acid (FOLVITE) 8196MCG tablet Take 400 mcg by mouth daily.   Magnesium 500 MG TABS Take 1 tablet by mouth daily.   Multiple Vitamin (MULTIVITAMIN) capsule Take 1 capsule by mouth daily.     Rivaroxaban (XARELTO) 15 MG TABS tablet Take 1 tablet (15 mg total) by mouth daily.   [DISCONTINUED] Calcium Carbonate (CALCIUM 500 PO) Take 1 tablet by mouth daily.     No facility-administered encounter medications on file as of 06/21/2022.    Allergies (verified) Patient has no known allergies.   History: Past Medical History:  Diagnosis Date   Arthritis    AV malformation of gastrointestinal tract    in cecum she denies history of GIB   Breast mass, right    Cataract    CV[place]re[place] vascular [place]) [place]   Dizziness    denies this as an ongoing problem   Hyperlipidemia    Microcytic anemia    Osteopenia    Persistent atrial fibrillation ([place]    s/p PVI 04/2010  CHADS2vasc at least 5   RLS (restless legs syndrome)    Sinus bradycardia    Past Surgical  History:  Procedure Laterality Date   afib ablation  04/2010   PVI with CTI ablation performed by [facility]   BREAST EXCISIONAL BIOPSY Right    benign cyst   breast mass resected     CARDIOVERSION     for afib   CATARACT EXTRACTION W/PHACO  11/11/2011   Procedure: CATARACT EXTRACTION PHACO AND INTRAOCULAR LENS PLACEMENT ([place]);  Surgeon: Tonny Branch, MD;  Location: [facility];  Service: Ophthalmology;  Laterality: Left;  CDE:12.62   CATARACT EXTRACTION W/PHACO  11/25/2011   Procedure: CATARACT EXTRACTION PHACO AND INTRAOCULAR LENS PLACEMENT ([facility]);  Surgeon: Tonny Branch, MD;  Location: [facility];  Service: Ophthalmology;   Laterality: Right;  CDE 16.14   dental inplants     Family History  Problem Relation Age of Onset   Stroke Mother 57   Hip fracture Mother    Lung cancer Father 58   Anesthesia problems Neg Hx    Hypotension Neg Hx    Malignant hyperthermia Neg Hx    Pseudochol deficiency Neg Hx    Breast cancer Neg Hx    Social History   Socioeconomic History   Marital status: Widowed    Spouse name: Not on file   Number of children: 0   Years of education: college   Highest education level: Not on file  Occupational History   Occupation: retired  Tobacco Use   Smoking status: Former    Packs/day: 1.00    Years: 30.00    Total pack years: 30.00    Types: Cigarettes    Quit date: 08/05/1978    Years since quitting: 43.9   Smokeless tobacco: Never   Tobacco comments:    quit in 1980s  Vaping Use   Vaping Use: Never used  Substance and Sexual Activity   Alcohol use: No   Drug use: No   Sexual activity: Never  Other Topics Concern   Not on file  Social History Narrative   Patient is retired and lives at home alone.    Patient has business college education. Right handed.    Caffeine- some times - soda    Daily exercise at [place] walks a mile a day at home.    Church every Sunday   Likes to go on trips [place]   Wears hearing aids/ dentures   Social Determinants of Health   Financial Resource Strain: Low Risk  (06/21/2022)   Overall Financial Resource Strain (CARDIA)    Difficulty of Paying Living Expenses: Not hard at all  Food Insecurity: No Food Insecurity (06/21/2022)   Hunger Vital Sign    Worried About Running Out of Food in the Last Year: Never true    Ran Out of Food in the Last Year: Never true  Transportation Needs: No Transportation Needs (06/21/2022)   PRAPARE - Hydrologist (Medical): No    Lack of Transportation (Non-Medical): No  Physical Activity: Sufficiently Active (06/21/2022)   Exercise Vital Sign    Days of  Exercise per Week: 5 days    Minutes of Exercise per Session: 60 min  Stress: No Stress Concern Present (06/21/2022)   [place]    Feeling of Stress : Not at all  Social Connections: Moderately Integrated (06/21/2022)   Social Connection and Isolation Panel [NHANES]    Frequency of Communication with Friends and Family: More than three times a week    Frequency of Social Gatherings  with Friends and Family: Three times a week    Attends Religious Services: More than 4 times per year    Active Member of Clubs or Organizations: Yes    Attends Archivist Meetings: More than 4 times per year    Marital Status: Widowed    Tobacco Counseling Counseling given: Not Answered Tobacco comments: quit in 1980s   Clinical Intake:  Pre-visit preparation completed: Yes  Pain : No/denies pain  Diabetic?No   Interpreter Needed?: No  Information entered by :: Denman George LPN   Activities of Daily Living    06/21/2022   12:55 PM  In your present state of health, do you have any difficulty performing the following activities:  Hearing? 0  Vision? 0  Difficulty concentrating or making decisions? 0  Walking or climbing stairs? 0  Dressing or bathing? 0  Doing errands, shopping? 0  Preparing Food and eating ? N  Using the Toilet? N  In the past six months, have you accidently leaked urine? N  Do you have problems with loss of bowel control? N  Managing your Medications? N  Managing your Finances? N  Housekeeping or managing your Housekeeping? N    Patient Care Team: Lisa [name], DO as PCP - General (Family Medicine) Minus Breeding, MD as Consulting Physician (Cardiology)  Indicate any recent Medical Services you may have received from other than [facility] providers in the past year (date may be approximate).     Assessment:   This is a routine wellness examination for [name].  Hearing/Vision  screen Hearing Screening - Comments:: Bilateral hearing aids-patient reported   Vision Screening - Comments:: Up to date with routine eye exams with [place] issues and exercise activities discussed: Current Exercise Habits: Home exercise routine, Type of exercise: walking, Time (Minutes): 30, Frequency (Times/Week): 5, Weekly Exercise (Minutes/Week): 150, Intensity: Moderate, Exercise limited by: None identified   Goals Addressed             This Visit's Progress    Have 3 meals a day   On track    Eat at least 3 healthy meals daily      Depression Screen    06/21/2022   12:54 PM 03/08/2022    2:14 PM 09/06/2021    8:56 AM 06/04/2021   10:13 AM 06/01/2021    2:07 PM 03/07/2021    8:00 AM 09/07/2020    8:04 AM  PHQ 2/9 Scores  PHQ - 2 Score 0 0 0 0 0 0 0  PHQ- 9 Score      0 0    Fall Risk    06/21/2022   12:53 PM 03/08/2022    2:14 PM 09/06/2021    8:56 AM 06/04/2021   10:13 AM 06/01/2021    2:28 PM  [place] in the past year? 0 0 0 1 0  Number falls in past yr: 0   0 0  Injury with Fall? 0   1 0  Risk for fall due to : No Fall Risks   History of fall(s) No Fall Risks  Follow up Falls evaluation completed   Falls evaluation completed Falls prevention discussed    [place]:  Any stairs in or around the home? Yes  If so, are there any without handrails? Yes  Home free of loose throw rugs in walkways, pet beds, electrical cords, etc? Yes  Adequate lighting in your home to reduce  risk of falls? Yes   ASSISTIVE DEVICES UTILIZED TO PREVENT FALLS:  Life alert? No  Use of a cane, walker or w/c? No  Grab bars in the bathroom? Yes  Shower chair or bench in shower? No  Elevated toilet seat or a handicapped toilet? No   Cognitive Function:    03/08/2022    2:15 PM 04/13/2018    9:15 AM 05/07/2016   10:41 AM  MMSE - Mini Mental State Exam  Orientation to time '3 5 5  '$ Orientation to Place '5 5 5  '$ Registration '3 3 3   '$ Attention/ Calculation '5 5 5  '$ Recall '1 2 3  '$ Language- name 2 objects '2 2 2  '$ Language- repeat '1 1 1  '$ Language- follow 3 step command '3 3 3  '$ Language- read & follow direction '1 1 1  '$ Write a sentence '1 1 1  '$ Copy design 0 1 1  Total score '25 29 30        '$ 06/21/2022   12:56 PM 06/01/2021    2:32 PM  6CIT Screen  What Year? 0 points 0 points  What month? 0 points 0 points  What time? 0 points 0 points  Count back from 20 0 points 0 points  Months in reverse 0 points 0 points  Repeat phrase 0 points 0 points  Total Score 0 points 0 points    Immunizations Immunization History  Administered Date(s) Administered   Influenza, High Dose Seasonal PF 05/07/2017, 06/26/2018   Influenza,inj,Quad PF,6+ Mos 07/27/2013, 05/19/2014, 08/24/2015, 05/07/2016, 05/27/2019   Influenza,inj,quad, With Preservative 04/07/2017   Influenza-Unspecified 05/24/2021   Moderna Sars-Covid-2 Vaccination 09/30/2019, 10/29/2019, 07/26/2020   Pneumococcal Conjugate-13 10/07/2013   Pneumococcal Polysaccharide-23 03/09/2019   Tdap 05/07/2011   Zoster Recombinat (Shingrix) 03/07/2021, 06/21/2021   Zoster, Live 09/30/2014    TDAP status: Up to date  Flu Vaccine status: Due, Education has been provided regarding the importance of this vaccine. Advised may receive this vaccine at local pharmacy or Health Dept. Aware to provide a copy of the vaccination record if obtained from local pharmacy or Health Dept. Verbalized acceptance and understanding.  Pneumococcal vaccine status: Up to date  Covid-19 vaccine status: Completed vaccines  Qualifies for Shingles Vaccine? Yes   Zostavax completed No   Shingrix Completed?: Yes  Screening Tests Health Maintenance  Topic Date Due   COVID-19 Vaccine (4 - Moderna risk series) 09/20/2020   INFLUENZA VACCINE  03/05/2022   MAMMOGRAM  03/09/2023 (Originally 03/05/2022)   DEXA SCAN  03/10/2023   Medicare Annual Wellness (AWV)  06/22/2023   Pneumonia Vaccine 81+ Years old   Completed   Zoster Vaccines- Shingrix  Completed   HPV VACCINES  Aged Out    Health Maintenance  Health Maintenance Due  Topic Date Due   COVID-19 Vaccine (4 - Moderna risk series) 09/20/2020   INFLUENZA VACCINE  03/05/2022    Colorectal cancer screening: No longer required.   Mammogram status: No longer required due to age.  Bone Density status: Completed 03/09/21. Results reflect: Bone density results: NORMAL. Repeat every 2 years.  Lung Cancer Screening: (Low Dose CT Chest recommended if Age 58-80 years, 30 pack-year currently smoking OR have quit w/in 15years.) does not qualify.   Lung Cancer Screening Referral: n/a  Additional Screening:  Hepatitis C Screening: does not qualify  Vision Screening: Recommended annual ophthalmology exams for early detection of glaucoma and other disorders of the eye. Is the patient up to date with their annual eye exam?  Yes  Who is the provider or what is the name of the office in which the patient attends annual eye exams? [facility] If pt is not established with a provider, would they like to be referred to a provider to establish care?  N/a .   Dental Screening: Recommended annual dental exams for proper oral hygiene  Community Resource Referral / Chronic Care Management: CRR required this visit?  No   CCM required this visit?  No      Plan:     I have personally reviewed and noted the following in the patient's chart:   Medical and social history Use of alcohol, tobacco or illicit drugs  Current medications and supplements including opioid prescriptions. Patient is not currently taking opioid prescriptions. Functional ability and status Nutritional status Physical activity Advanced directives List of other physicians Hospitalizations, surgeries, and ER visits in previous 12 months Vitals Screenings to include cognitive, depression, and falls Referrals and appointments  In addition, I have reviewed and discussed with  patient certain preventive protocols, quality metrics, and best practice recommendations. A written personalized care plan for preventive services as well as general preventive health recommendations were provided to patient.     Denman George [place], [place]   [id number]   Nurse Notes: no concerns; plans to get Flu shot at pharmacy    I have read nurse Blackwell's note and agree with her assessment and plan.  Patient to continue following with PCP for ongoing chronic medical problems.  Ashly M. Lajuana Ripple, [facility]

## 2022-06-21 NOTE — Patient Instructions (Signed)
Ms. [name] , Thank you for taking time to come for your Medicare Wellness Visit. I appreciate your ongoing commitment to your health goals. Please review the following plan we discussed and let me know if I can assist you in the future.   These are the goals we discussed:  Goals      Exercise 3x per week (30 min per time)     Continue to exercise for at least 30 minutes, 3 times weekly     Have 3 meals a day     Eat at least 3 healthy meals daily        This is a list of the screening recommended for you and due dates:  Health Maintenance  Topic Date Due   COVID-19 Vaccine (4 - Moderna risk series) 09/20/2020   Flu Shot  03/05/2022   Mammogram  03/09/2023*   DEXA scan (bone density measurement)  03/10/2023   Medicare Annual Wellness Visit  06/22/2023   Pneumonia Vaccine  Completed   Zoster (Shingles) Vaccine  Completed   HPV Vaccine  Aged Out  *Topic was postponed. The date shown is not the original due date.    Advanced directives: Please bring a copy of your health care power of attorney and living will to the office to be added to your chart at your convenience.   Conditions/risks identified: Aim for 30 minutes of exercise or brisk walking, 6-8 glasses of water, and 5 servings of fruits and vegetables each day.   Next appointment: Follow up in one year for your annual wellness visit    Preventive Care 65 Years and Older, Female Preventive care refers to lifestyle choices and visits with your health care provider that can promote health and wellness. What does preventive care include? A yearly physical exam. This is also called an annual well check. Dental exams once or twice a year. Routine eye exams. Ask your health care provider how often you should have your eyes checked. Personal lifestyle choices, including: Daily care of your teeth and gums. Regular physical activity. Eating a healthy diet. Avoiding tobacco and drug use. Limiting alcohol  use. Practicing safe sex. Taking low-dose aspirin every day. Taking vitamin and mineral supplements as recommended by your health care provider. What happens during an annual well check? The services and screenings done by your health care provider during your annual well check will depend on your age, overall health, lifestyle risk factors, and family history of disease. Counseling  Your health care provider may ask you questions about your: Alcohol use. Tobacco use. Drug use. Emotional well-being. Home and relationship well-being. Sexual activity. Eating habits. History of falls. Memory and ability to understand (cognition). Work and work Statistician. Reproductive health. Screening  You may have the following tests or measurements: Height, weight, and BMI. Blood pressure. Lipid and cholesterol levels. These may be checked every 5 years, or more frequently if you are over 45 years old. Skin check. Lung cancer screening. You may have this screening every year starting at age 30 if you have a 30-pack-year history of smoking and currently smoke or have quit within the past 15 years. Fecal occult blood test (FOBT) of the stool. You may have this test every year starting at age 63. Flexible sigmoidoscopy or colonoscopy. You may have a sigmoidoscopy every 5 years or a colonoscopy every 10 years starting at age 51. Hepatitis C blood test. Hepatitis B blood test. Sexually transmitted disease (STD) testing. Diabetes screening. This is done by checking your  blood sugar (glucose) after you have not eaten for a while (fasting). You may have this done every 1-3 years. Bone density scan. This is done to screen for osteoporosis. You may have this done starting at age 43. Mammogram. This may be done every 1-2 years. Talk to your health care provider about how often you should have regular mammograms. Talk with your health care provider about your test results, treatment options, and if necessary,  the need for more tests. Vaccines  Your health care provider may recommend certain vaccines, such as: Influenza vaccine. This is recommended every year. Tetanus, diphtheria, and acellular pertussis (Tdap, Td) vaccine. You may need a Td booster every 10 years. Zoster vaccine. You may need this after age 30. Pneumococcal 13-valent conjugate (PCV13) vaccine. One dose is recommended after age 31. Pneumococcal polysaccharide (PPSV23) vaccine. One dose is recommended after age 93. Talk to your health care provider about which screenings and vaccines you need and how often you need them. This information is not intended to replace advice given to you by your health care provider. Make sure you discuss any questions you have with your health care provider. Document Released: 08/18/2015 Document Revised: 04/10/2016 Document Reviewed: 05/23/2015 Elsevier Interactive Patient Education  2017 [employer] Prevention in the Home Falls can cause injuries. They can happen to people of all ages. There are many things you can do to make your home safe and to help prevent falls. What can I do on the outside of my home? Regularly fix the edges of walkways and driveways and fix any cracks. Remove anything that might make you trip as you walk through a door, such as a raised step or threshold. Trim any bushes or trees on the path to your home. Use bright outdoor lighting. Clear any walking paths of anything that might make someone trip, such as rocks or tools. Regularly check to see if handrails are loose or broken. Make sure that both sides of any steps have handrails. Any raised decks and porches should have guardrails on the edges. Have any leaves, snow, or ice cleared regularly. Use sand or salt on walking paths during winter. Clean up any spills in your garage right away. This includes oil or grease spills. What can I do in the bathroom? Use night lights. Install grab bars by the toilet and in the  tub and shower. Do not use towel bars as grab bars. Use non-skid mats or decals in the tub or shower. If you need to sit down in the shower, use a plastic, non-slip stool. Keep the floor dry. Clean up any water that spills on the floor as soon as it happens. Remove soap buildup in the tub or shower regularly. Attach bath mats securely with double-sided non-slip rug tape. Do not have throw rugs and other things on the floor that can make you trip. What can I do in the bedroom? Use night lights. Make sure that you have a light by your bed that is easy to reach. Do not use any sheets or blankets that are too big for your bed. They should not hang down onto the floor. Have a firm chair that has side arms. You can use this for support while you get dressed. Do not have throw rugs and other things on the floor that can make you trip. What can I do in the kitchen? Clean up any spills right away. Avoid walking on wet floors. Keep items that you use a lot in  easy-to-reach places. If you need to reach something above you, use a strong step stool that has a grab bar. Keep electrical cords out of the way. Do not use floor polish or wax that makes floors slippery. If you must use wax, use non-skid floor wax. Do not have throw rugs and other things on the floor that can make you trip. What can I do with my stairs? Do not leave any items on the stairs. Make sure that there are handrails on both sides of the stairs and use them. Fix handrails that are broken or loose. Make sure that handrails are as long as the stairways. Check any carpeting to make sure that it is firmly attached to the stairs. Fix any carpet that is loose or worn. Avoid having throw rugs at the top or bottom of the stairs. If you do have throw rugs, attach them to the floor with carpet tape. Make sure that you have a light switch at the top of the stairs and the bottom of the stairs. If you do not have them, ask someone to add them for  you. What else can I do to help prevent falls? Wear shoes that: Do not have high heels. Have rubber bottoms. Are comfortable and fit you well. Are closed at the toe. Do not wear sandals. If you use a stepladder: Make sure that it is fully opened. Do not climb a closed stepladder. Make sure that both sides of the stepladder are locked into place. Ask someone to hold it for you, if possible. Clearly mark and make sure that you can see: Any grab bars or handrails. First and last steps. Where the edge of each step is. Use tools that help you move around (mobility aids) if they are needed. These include: Canes. Walkers. Scooters. Crutches. Turn on the lights when you go into a dark area. Replace any light bulbs as soon as they burn out. Set up your furniture so you have a clear path. Avoid moving your furniture around. If any of your floors are uneven, fix them. If there are any pets around you, be aware of where they are. Review your medicines with your doctor. Some medicines can make you feel dizzy. This can increase your chance of falling. Ask your doctor what other things that you can do to help prevent falls. This information is not intended to replace advice given to you by your health care provider. Make sure you discuss any questions you have with your health care provider. Document Released: 05/18/2009 Document Revised: 12/28/2015 Document Reviewed: 08/26/2014 Elsevier Interactive Patient Education  2017 [employer].

## 2022-07-12 ENCOUNTER — Other Ambulatory Visit: Payer: Self-pay | Admitting: Family Medicine

## 2022-07-12 DIAGNOSIS — I480 Paroxysmal atrial fibrillation: Secondary | ICD-10-CM

## 2022-08-05 ENCOUNTER — Other Ambulatory Visit: Payer: Self-pay | Admitting: Family Medicine

## 2022-08-05 DIAGNOSIS — E7800 Pure hypercholesterolemia, unspecified: Secondary | ICD-10-CM

## 2022-09-06 ENCOUNTER — Encounter: Payer: Medicare PPO | Admitting: Family Medicine

## 2022-09-15 NOTE — Progress Notes (Unsigned)
Cardiology Office Note   Date:  09/18/2022   ID:  [name], DOB 1931-03-04, MRN [id number]  PCP:  Lisa [name], DO  Cardiologist:   Lisa Breeding, MD   Chief Complaint  Patient presents with   Atrial Fibrillation      History of Present Illness: [name] is a 89 y.o. female who presents for follow up of atrial fib.   She has chronic atrial fib.  She thought she was due for her appointment.  Looks like she is a little early.  The patient denies any new symptoms such as chest discomfort, neck or arm discomfort. There has been no new shortness of breath, PND or orthopnea. There have been no reported palpitations, presyncope or syncope.  She clearly has a memory issue.  She was seeing Lisa [name], DO yesterday but does not recall this.    Past Medical History:  Diagnosis Date   Arthritis    AV malformation of gastrointestinal tract    in cecum she denies history of GIB   Breast mass, right    Cataract    CVA (cerebral vascular accident) ([place]) 2007   Dizziness    denies this as an ongoing problem   Hyperlipidemia    Microcytic anemia    Osteopenia    Persistent atrial fibrillation ([place])    s/p PVI 04/2010  CHADS2vasc at least 5   RLS (restless legs syndrome)    Sinus bradycardia     Past Surgical History:  Procedure Laterality Date   afib ablation  04/2010   PVI with CTI ablation performed by JA   BREAST EXCISIONAL BIOPSY Right    benign cyst   breast mass resected     CARDIOVERSION     for afib   CATARACT EXTRACTION W/PHACO  11/11/2011   Procedure: CATARACT EXTRACTION PHACO AND INTRAOCULAR LENS PLACEMENT ([place]);  Surgeon: Tonny Branch, MD;  Location: [facility];  Service: Ophthalmology;  Laterality: Left;  CDE:12.62   CATARACT EXTRACTION W/PHACO  11/25/2011   Procedure: CATARACT EXTRACTION PHACO AND INTRAOCULAR LENS PLACEMENT ([facility]);  Surgeon: Tonny Branch, MD;  Location: [facility];  Service: Ophthalmology;  Laterality: Right;  CDE 16.14    dental inplants       Current Outpatient Medications  Medication Sig Dispense Refill   acetaminophen (TYLENOL) 500 MG tablet Take 1-2 tablets (500-1,000 mg total) by mouth 3 (three) times daily as needed (arthritis pain). 30 tablet 0   Ascorbic Acid (VITAMIN C) 500 MG CAPS Take 500 mg by mouth daily.     atorvastatin (LIPITOR) 10 MG tablet TAKE 1/2 TABLET BY MOUTH ONCE A DAY OR AS INSTRUCTED BY PHYSICIAN 45 tablet 0   Calcium Carb-Cholecalciferol 600-800 MG-UNIT TABS Take by mouth daily.     Cholecalciferol (VITAMIN D3) 1000 UNITS CAPS Take 1 capsule by mouth daily.     diltiazem (CARDIZEM CD) 180 MG 24 hr capsule TAKE 1 CAPSULE BY MOUTH EVERY DAY 90 capsule 0   folic acid (FOLVITE) Q000111Q MCG tablet Take 400 mcg by mouth daily.     Magnesium 500 MG TABS Take 1 tablet by mouth daily.     Multiple Vitamin (MULTIVITAMIN) capsule Take 1 capsule by mouth daily.       Rivaroxaban (XARELTO) 15 MG TABS tablet Take 1 tablet (15 mg total) by mouth daily. 90 tablet 3   No current facility-administered medications for this visit.    Allergies:   Patient has no known allergies.  ROS:  Please see the history of present illness.   Otherwise, review of systems are positive for None.   All other systems are reviewed and negative.    PHYSICAL EXAM: VS:  BP 118/80   Pulse 84   Ht 5' 3"$  (1.6 m)   Wt 121 lb (54.9 kg)   BMI 21.43 kg/m  , BMI Body mass index is 21.43 kg/m. GENERAL:  Well appearing NECK:  No jugular venous distention, waveform within normal limits, carotid upstroke brisk and symmetric, no bruits, no thyromegaly LUNGS:  Clear to auscultation bilaterally CHEST:  Unremarkable HEART:  PMI not displaced or sustained,S1 and S2 within normal limits, no S3, no S4, no clicks, no rubs, no murmurs, irregular  ABD:  Flat, positive bowel sounds normal in frequency in pitch, no bruits, no rebound, no guarding, no midline pulsatile mass, no hepatomegaly, no splenomegaly EXT:  2 plus pulses  throughout, no edema, no cyanosis no clubbing   EKG:  EKG is not ordered today.   Recent Labs: 09/17/2022: ALT 7; BUN 13; Creatinine, Ser 1.09; Hemoglobin 13.1; Magnesium 2.1; Platelets 293; Potassium 4.7; Sodium 139; TSH 2.070    Lipid Panel    Component Value Date/Time   CHOL 144 09/17/2022 0937   CHOL 140 07/02/2013 1013   TRIG 83 09/17/2022 0937   TRIG 73 04/19/2016 0815   TRIG 71 07/02/2013 1013   HDL 57 09/17/2022 0937   HDL 62 04/19/2016 0815   HDL 61 07/02/2013 1013   CHOLHDL 2.5 09/17/2022 0937   LDLCALC 71 09/17/2022 0937   LDLCALC 76 01/17/2014 0914   LDLCALC 65 07/02/2013 1013      Wt Readings from Last 3 Encounters:  09/18/22 121 lb (54.9 kg)  09/17/22 121 lb 12.8 oz (55.2 kg)  06/21/22 125 lb (56.7 kg)      Other studies Reviewed: Additional studies/ records that were reviewed today include: Labs Review of the above records demonstrates:  See elsewhere   ASSESSMENT AND PLAN:  Permanent atrial fibrillation:    Ms. [name] has a CHA2DS2 - VASc score of 5.  Blood work drawn yesterday looked okay.  She tolerates atrial fibrillation.  She tolerates anticoagulation.  She is borderline to the 15 mg dose but I have decided to use this.  Memory disorder:  I sent a message to Lisa [name], DO   Current medicines are reviewed at length with the patient today.  The patient does not have concerns regarding medicines.  The following changes have been made: None  Labs/ tests ordered today include: None  No orders of the defined types were placed in this encounter.    Disposition:   FU with me in 12 months.     Signed, Lisa Breeding, MD  09/18/2022 10:55 AM    [place]

## 2022-09-17 ENCOUNTER — Encounter: Payer: Self-pay | Admitting: Family Medicine

## 2022-09-17 ENCOUNTER — Ambulatory Visit (INDEPENDENT_AMBULATORY_CARE_PROVIDER_SITE_OTHER): Payer: Medicare PPO | Admitting: Family Medicine

## 2022-09-17 VITALS — BP 122/75 | HR 77 | Temp 98.7°F | Ht 63.0 in | Wt 121.8 lb

## 2022-09-17 DIAGNOSIS — M8580 Other specified disorders of bone density and structure, unspecified site: Secondary | ICD-10-CM

## 2022-09-17 DIAGNOSIS — E7800 Pure hypercholesterolemia, unspecified: Secondary | ICD-10-CM

## 2022-09-17 DIAGNOSIS — N1831 Chronic kidney disease, stage 3a: Secondary | ICD-10-CM | POA: Diagnosis not present

## 2022-09-17 DIAGNOSIS — E559 Vitamin D deficiency, unspecified: Secondary | ICD-10-CM

## 2022-09-17 DIAGNOSIS — Z23 Encounter for immunization: Secondary | ICD-10-CM | POA: Diagnosis not present

## 2022-09-17 DIAGNOSIS — Z0000 Encounter for general adult medical examination without abnormal findings: Secondary | ICD-10-CM

## 2022-09-17 DIAGNOSIS — Z0001 Encounter for general adult medical examination with abnormal findings: Secondary | ICD-10-CM | POA: Diagnosis not present

## 2022-09-17 DIAGNOSIS — I480 Paroxysmal atrial fibrillation: Secondary | ICD-10-CM | POA: Diagnosis not present

## 2022-09-17 NOTE — Patient Instructions (Signed)
Preventive Care 65 Years and Older, Female Preventive care refers to lifestyle choices and visits with your health care provider that can promote health and wellness. Preventive care visits are also called wellness exams. What can I expect for my preventive care visit? Counseling Your health care provider may ask you questions about your: Medical history, including: Past medical problems. Family medical history. Pregnancy and menstrual history. History of falls. Current health, including: Memory and ability to understand (cognition). Emotional well-being. Home life and relationship well-being. Sexual activity and sexual health. Lifestyle, including: Alcohol, nicotine or tobacco, and drug use. Access to firearms. Diet, exercise, and sleep habits. Work and work environment. Sunscreen use. Safety issues such as seatbelt and bike helmet use. Physical exam Your health care provider will check your: Height and weight. These may be used to calculate your BMI (body mass index). BMI is a measurement that tells if you are at a healthy weight. Waist circumference. This measures the distance around your waistline. This measurement also tells if you are at a healthy weight and may help predict your risk of certain diseases, such as type 2 diabetes and high blood pressure. Heart rate and blood pressure. Body temperature. Skin for abnormal spots. What immunizations do I need?  Vaccines are usually given at various ages, according to a schedule. Your health care provider will recommend vaccines for you based on your age, medical history, and lifestyle or other factors, such as travel or where you work. What tests do I need? Screening Your health care provider may recommend screening tests for certain conditions. This may include: Lipid and cholesterol levels. Hepatitis C test. Hepatitis B test. HIV (human immunodeficiency virus) test. STI (sexually transmitted infection) testing, if you are at  risk. Lung cancer screening. Colorectal cancer screening. Diabetes screening. This is done by checking your blood sugar (glucose) after you have not eaten for a while (fasting). Mammogram. Talk with your health care provider about how often you should have regular mammograms. BRCA-related cancer screening. This may be done if you have a family history of breast, ovarian, tubal, or peritoneal cancers. Bone density scan. This is done to screen for osteoporosis. Talk with your health care provider about your test results, treatment options, and if necessary, the need for more tests. Follow these instructions at home: Eating and drinking  Eat a diet that includes fresh fruits and vegetables, whole grains, lean protein, and low-fat dairy products. Limit your intake of foods with high amounts of sugar, saturated fats, and salt. Take vitamin and mineral supplements as recommended by your health care provider. Do not drink alcohol if your health care provider tells you not to drink. If you drink alcohol: Limit how much you have to 0-1 drink a day. Know how much alcohol is in your drink. In the [place] one drink equals one 12 oz bottle of beer (355 mL), one 5 oz glass of wine (148 mL), or one 1 oz glass of hard liquor (44 mL). Lifestyle Brush your teeth every morning and night with fluoride toothpaste. Floss one time each day. Exercise for at least 30 minutes 5 or more days each week. Do not use any products that contain nicotine or tobacco. These products include cigarettes, chewing tobacco, and vaping devices, such as e-cigarettes. If you need help quitting, ask your health care provider. Do not use drugs. If you are sexually active, practice safe sex. Use a condom or other form of protection in order to prevent STIs. Take aspirin only as told by   your health care provider. Make sure that you understand how much to take and what form to take. Work with your health care provider to find out whether it  is safe and beneficial for you to take aspirin daily. Ask your health care provider if you need to take a cholesterol-lowering medicine (statin). Find healthy ways to manage stress, such as: Meditation, yoga, or listening to music. Journaling. Talking to a trusted person. Spending time with friends and family. Minimize exposure to UV radiation to reduce your risk of skin cancer. Safety Always wear your seat belt while driving or riding in a vehicle. Do not drive: If you have been drinking alcohol. Do not ride with someone who has been drinking. When you are tired or distracted. While texting. If you have been using any mind-altering substances or drugs. Wear a helmet and other protective equipment during sports activities. If you have firearms in your house, make sure you follow all gun safety procedures. What's next? Visit your health care provider once a year for an annual wellness visit. Ask your health care provider how often you should have your eyes and teeth checked. Stay up to date on all vaccines. This information is not intended to replace advice given to you by your health care provider. Make sure you discuss any questions you have with your health care provider. Document Revised: 01/17/2021 Document Reviewed: 01/17/2021 Elsevier Patient Education  2023 Elsevier Inc.  

## 2022-09-17 NOTE — Progress Notes (Signed)
[name] is a 89 y.o. female presents to office today for annual physical exam examination.    Concerns today include: 1. none  Occupation: retired. Lives alone but her [place] lives within 10 minutes of her home.  She states his wife is her POA if something happens to her. Substance use: none Diet: balanced, Exercise: Regularly exercises at the recreation center.  She walks daily and does a class a couple of days per week as well Last eye exam: Does not have eyes checked Last dental exam: Wears false teeth Last colonoscopy: N/A Last mammogram: N/A Last pap smear: N/A Refills needed today: None Immunizations needed: Influenza vaccine Immunization History  Administered Date(s) Administered   Fluad Quad(high Dose 65+) 09/17/2022   Influenza, High Dose Seasonal PF 05/07/2017, 06/26/2018   Influenza,inj,Quad PF,6+ Mos 07/27/2013, 05/19/2014, 08/24/2015, 05/07/2016, 05/27/2019   Influenza,inj,quad, With Preservative 04/07/2017   Influenza-Unspecified 05/24/2021   Moderna Sars-Covid-2 Vaccination 09/30/2019, 10/29/2019, 07/26/2020   Pneumococcal Conjugate-13 10/07/2013   Pneumococcal Polysaccharide-23 03/09/2019   Tdap 05/07/2011   Zoster Recombinat (Shingrix) 03/07/2021, 06/21/2021   Zoster, Live 09/30/2014     Past Medical History:  Diagnosis Date   Arthritis    AV malformation of gastrointestinal tract    in cecum she denies history of GIB   Breast mass, right    Cataract    CVA (cerebral vascular accident) ([place]) 2007   Dizziness    denies this as an ongoing problem   Hyperlipidemia    Microcytic anemia    Osteopenia    Persistent atrial fibrillation ([place])    s/p PVI 04/2010  CHADS2vasc at least 5   RLS (restless legs syndrome)    Sinus bradycardia    Social History   Socioeconomic History   Marital status: Widowed    Spouse name: Not on file   Number of children: 0   Years of education: college   Highest education level: Not on file   Occupational History   Occupation: retired  Tobacco Use   Smoking status: Former    Packs/day: 1.00    Years: 30.00    Total pack years: 30.00    Types: Cigarettes    Quit date: 08/05/1978    Years since quitting: 44.1   Smokeless tobacco: Never   Tobacco comments:    quit in 1980s  Vaping Use   Vaping Use: Never used  Substance and Sexual Activity   Alcohol use: No   Drug use: No   Sexual activity: Never  Other Topics Concern   Not on file  Social History Narrative   Patient is retired and lives at home alone.    [place] and his wife Ulis Rias are her closest living relatives.  They are who will make medical decisions for her if she is unable.   Patient has business college education. Right handed.    Caffeine- some times - soda    Daily exercise at [place] walks a mile a day at home.    Church every Sunday   Likes to go on trips with the rec center   Wears hearing aids/ dentures   Social Determinants of Health   Financial Resource Strain: Low Risk  (06/21/2022)   Overall Financial Resource Strain (CARDIA)    Difficulty of Paying Living Expenses: Not hard at all  Food Insecurity: No Food Insecurity (06/21/2022)   Hunger Vital Sign    Worried About Running Out of Food in the Last Year: Never true  Ran Out of Food in the Last Year: Never true  Transportation Needs: No Transportation Needs (06/21/2022)   PRAPARE - Hydrologist (Medical): No    Lack of Transportation (Non-Medical): No  Physical Activity: Sufficiently Active (06/21/2022)   Exercise Vital Sign    Days of Exercise per Week: 5 days    Minutes of Exercise per Session: 60 min  Stress: No Stress Concern Present (06/21/2022)   [place]    Feeling of Stress : Not at all  Social Connections: Moderately Integrated (06/21/2022)   Social Connection and Isolation Panel [NHANES]    Frequency of Communication  with Friends and Family: More than three times a week    Frequency of Social Gatherings with Friends and Family: Three times a week    Attends Religious Services: More than 4 times per year    Active Member of Clubs or Organizations: Yes    Attends Archivist Meetings: More than 4 times per year    Marital Status: Widowed  Intimate Partner Violence: Not At Risk (06/21/2022)   Humiliation, Afraid, Rape, and Kick questionnaire    Fear of Current or Ex-Partner: No    Emotionally Abused: No    Physically Abused: No    Sexually Abused: No   Past Surgical History:  Procedure Laterality Date   afib ablation  04/2010   PVI with CTI ablation performed by JA   BREAST EXCISIONAL BIOPSY Right    benign cyst   breast mass resected     CARDIOVERSION     for afib   CATARACT EXTRACTION W/PHACO  11/11/2011   Procedure: CATARACT EXTRACTION PHACO AND INTRAOCULAR LENS PLACEMENT ([place]);  Surgeon: Tonny Branch, MD;  Location: [facility];  Service: Ophthalmology;  Laterality: Left;  CDE:12.62   CATARACT EXTRACTION W/PHACO  11/25/2011   Procedure: CATARACT EXTRACTION PHACO AND INTRAOCULAR LENS PLACEMENT ([facility]);  Surgeon: Tonny Branch, MD;  Location: [facility];  Service: Ophthalmology;  Laterality: Right;  CDE 16.14   dental inplants     Family History  Problem Relation Age of Onset   Stroke Mother 75   Hip fracture Mother    Lung cancer Father 9   Anesthesia problems Neg Hx    Hypotension Neg Hx    Malignant hyperthermia Neg Hx    Pseudochol deficiency Neg Hx    Breast cancer Neg Hx     Current Outpatient Medications:    acetaminophen (TYLENOL) 500 MG tablet, Take 1-2 tablets (500-1,000 mg total) by mouth 3 (three) times daily as needed (arthritis pain)., Disp: 30 tablet, Rfl: 0   Ascorbic Acid (VITAMIN C) 500 MG CAPS, Take 500 mg by mouth daily., Disp: , Rfl:    atorvastatin (LIPITOR) 10 MG tablet, TAKE 1/2 TABLET BY MOUTH ONCE A DAY OR AS INSTRUCTED BY PHYSICIAN, Disp: 45 tablet, Rfl: 0   Calcium  Carb-Cholecalciferol 600-800 MG-UNIT TABS, Take by mouth daily., Disp: , Rfl:    Cholecalciferol (VITAMIN D3) 1000 UNITS CAPS, Take 1 capsule by mouth daily., Disp: , Rfl:    diltiazem (CARDIZEM CD) 180 MG 24 hr capsule, TAKE 1 CAPSULE BY MOUTH EVERY DAY, Disp: 90 capsule, Rfl: 0   folic acid (FOLVITE) Q000111Q MCG tablet, Take 400 mcg by mouth daily., Disp: , Rfl:    Magnesium 500 MG TABS, Take 1 tablet by mouth daily., Disp: , Rfl:    Multiple Vitamin (MULTIVITAMIN) capsule, Take 1 capsule by mouth daily.  ,  Disp: , Rfl:    Rivaroxaban (XARELTO) 15 MG TABS tablet, Take 1 tablet (15 mg total) by mouth daily., Disp: 90 tablet, Rfl: 3  No Known Allergies   ROS: Review of Systems A comprehensive review of systems was negative except for: Ears, nose, mouth, throat, and face: positive for hearing loss and false teeth    Physical exam BP 122/75   Pulse 77   Temp 98.7 F (37.1 C)   Ht 5' 3"$  (1.6 m)   Wt 121 lb 12.8 oz (55.2 kg)   SpO2 97%   BMI 21.58 kg/m  General appearance: alert, cooperative, appears stated age, and no distress Head: Normocephalic, without obvious abnormality, atraumatic Eyes: negative findings: lids and lashes normal, conjunctivae and sclerae normal, corneas clear, and pupils equal, round, reactive to light and accomodation Ears: normal TM's and external ear canals both ears Nose: Nares normal. Septum midline. Mucosa normal. No drainage or sinus tenderness. Throat:  Dentures present.  Moist mucous membranes.  Oropharynx without erythema or masses Neck: no adenopathy, supple, symmetrical, trachea midline, and thyroid not enlarged, symmetric, no tenderness/mass/nodules Back: symmetric, no curvature. ROM normal. No CVA tenderness. Lungs: clear to auscultation bilaterally Heart: regular rate and rhythm, S1, S2 normal, no murmur, click, rub or gallop Abdomen: soft, non-tender; bowel sounds normal; no masses,  no organomegaly Extremities: extremities normal, atraumatic, no  cyanosis or edema Pulses: 2+ and symmetric Skin:  Senile purpura present Lymph nodes: Cervical, supraclavicular, and axillary nodes normal. Neurologic: Grossly normal except for some memory issues observed during the visit    09/17/2022    8:53 AM 06/21/2022   12:54 PM 03/08/2022    2:14 PM  Depression screen PHQ 2/9  Decreased Interest 0 0 0  Down, Depressed, Hopeless 0 0 0  PHQ - 2 Score 0 0 0  Altered sleeping 0    Tired, decreased energy 0    Change in appetite 0    Feeling bad or failure about yourself  0    Trouble concentrating 0    Moving slowly or fidgety/restless 0    Suicidal thoughts 0    PHQ-9 Score 0    Difficult doing work/chores Not difficult at all       Assessment/ Plan: Charletta Cousin here for annual physical exam.   Annual physical exam  Need for immunization against influenza - Plan: Flu Vaccine QUAD High Dose(Fluad)  Stage 3a chronic kidney disease ([place]) - Plan: CMP14+EGFR, CBC, VITAMIN D 25 Hydroxy (Vit-D Deficiency, Fractures)  Paroxysmal atrial fibrillation ([facility]) - Plan: CMP14+EGFR, CBC, TSH, Magnesium  Pure hypercholesterolemia - Plan: Lipid Panel  Vitamin D deficiency - Plan: VITAMIN D 25 Hydroxy (Vit-D Deficiency, Fractures)  Low bone mass - Plan: VITAMIN D 25 Hydroxy (Vit-D Deficiency, Fractures)  She had a fairly good exam today.  The 1 thing I do have some concern about is her memory.  We did an MMSE back in August and this was borderline.  I am going to plan to set her up for repeat MMSE at her next visit.  If we see a notable decline since last check, plan for initiation of medications.  Check renal function, vitamin levels, CBC given chronic anticoagulation, fasting lipid and magnesium level.  Counseled on healthy lifestyle choices, including diet (rich in fruits, vegetables and lean meats and low in salt and simple carbohydrates) and exercise (at least 30 minutes of moderate physical activity daily).  Patient to follow up in  53m Emberlee Sortino M. GLajuana Ripple DO

## 2022-09-18 ENCOUNTER — Encounter: Payer: Self-pay | Admitting: Cardiology

## 2022-09-18 ENCOUNTER — Ambulatory Visit: Payer: Medicare PPO | Admitting: Cardiology

## 2022-09-18 VITALS — BP 118/80 | HR 84 | Ht 63.0 in | Wt 121.0 lb

## 2022-09-18 DIAGNOSIS — I4821 Permanent atrial fibrillation: Secondary | ICD-10-CM | POA: Diagnosis not present

## 2022-09-18 LAB — CBC
Hematocrit: 40.1 % (ref 34.0–46.6)
Hemoglobin: 13.1 g/dL (ref 11.1–15.9)
MCH: 30.4 pg (ref 26.6–33.0)
MCHC: 32.7 g/dL (ref 31.5–35.7)
MCV: 93 fL (ref 79–97)
Platelets: 293 10*3/uL (ref 150–450)
RBC: 4.31 x10E6/uL (ref 3.77–5.28)
RDW: 11.7 % (ref 11.7–15.4)
WBC: 6.9 10*3/uL (ref 3.4–10.8)

## 2022-09-18 LAB — CMP14+EGFR
ALT: 7 IU/L (ref 0–32)
AST: 16 IU/L (ref 0–40)
Albumin/Globulin Ratio: 1.2 (ref 1.2–2.2)
Albumin: 4 g/dL (ref 3.7–4.7)
Alkaline Phosphatase: 76 IU/L (ref 44–121)
BUN/Creatinine Ratio: 12 (ref 12–28)
BUN: 13 mg/dL (ref 8–27)
Bilirubin Total: 0.5 mg/dL (ref 0.0–1.2)
CO2: 23 mmol/L (ref 20–29)
Calcium: 10 mg/dL (ref 8.7–10.3)
Chloride: 102 mmol/L (ref 96–106)
Creatinine, Ser: 1.09 mg/dL — ABNORMAL HIGH (ref 0.57–1.00)
Globulin, Total: 3.3 g/dL (ref 1.5–4.5)
Glucose: 85 mg/dL (ref 70–99)
Potassium: 4.7 mmol/L (ref 3.5–5.2)
Sodium: 139 mmol/L (ref 134–144)
Total Protein: 7.3 g/dL (ref 6.0–8.5)
eGFR: 49 mL/min/{1.73_m2} — ABNORMAL LOW (ref >59)

## 2022-09-18 LAB — LIPID PANEL
Chol/HDL Ratio: 2.5 ratio (ref 0.0–4.4)
Cholesterol, Total: 144 mg/dL (ref 100–199)
HDL: 57 mg/dL (ref >39)
LDL Chol Calc (NIH): 71 mg/dL (ref 0–99)
Triglycerides: 83 mg/dL (ref 0–149)
VLDL Cholesterol Cal: 16 mg/dL (ref 5–40)

## 2022-09-18 LAB — TSH: TSH: 2.07 u[IU]/mL (ref 0.450–4.500)

## 2022-09-18 LAB — VITAMIN D 25 HYDROXY (VIT D DEFICIENCY, FRACTURES): Vit D, 25-Hydroxy: 36.1 ng/mL (ref 30.0–100.0)

## 2022-09-18 LAB — MAGNESIUM: Magnesium: 2.1 mg/dL (ref 1.6–2.3)

## 2022-09-18 NOTE — Patient Instructions (Signed)
Medication Instructions:  The current medical regimen is effective;  continue present plan and medications.  *If you need a refill on your cardiac medications before your next appointment, please call your pharmacy*  Follow-Up: At [facility], you and your health needs are our priority.  As part of our continuing mission to provide you with exceptional heart care, we have created designated Provider Care Teams.  These Care Teams include your primary Cardiologist (physician) and Advanced Practice Providers (APPs -  Physician Assistants and Nurse Practitioners) who all work together to provide you with the care you need, when you need it.  We recommend signing up for the patient portal called "MyChart".  Sign up information is provided on this After Visit Summary.  MyChart is used to connect with patients for Virtual Visits (Telemedicine).  Patients are able to view lab/test results, encounter notes, upcoming appointments, etc.  Non-urgent messages can be sent to your provider as well.   To learn more about what you can do with MyChart, go to [email or web address].    Your next appointment:   1 year(s)  Provider:   Minus Breeding, MD

## 2022-10-16 ENCOUNTER — Other Ambulatory Visit: Payer: Self-pay | Admitting: Family Medicine

## 2022-10-16 DIAGNOSIS — I480 Paroxysmal atrial fibrillation: Secondary | ICD-10-CM

## 2022-10-16 DIAGNOSIS — E7800 Pure hypercholesterolemia, unspecified: Secondary | ICD-10-CM

## 2023-01-02 ENCOUNTER — Telehealth: Payer: Self-pay | Admitting: Family Medicine

## 2023-01-02 NOTE — Telephone Encounter (Signed)
Was speaking to Stanton and she had to answer a work phone call.  I was not able to hold.    When she calls back need specific information as to why apt needs to be moved up. 6 month follow up is due in Aug.  If she needs to be seen sooner please get reason why.

## 2023-01-02 NOTE — Telephone Encounter (Signed)
Wants to talk to PCP or nurse about getting patient in to be seen sooner and has other information to give. No other details given. Please call back.

## 2023-02-24 ENCOUNTER — Telehealth: Payer: Self-pay | Admitting: Family Medicine

## 2023-02-24 NOTE — Telephone Encounter (Signed)
Attempted to contact - NA 

## 2023-02-24 NOTE — Telephone Encounter (Signed)
[name]
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
## 2023-03-14 NOTE — Telephone Encounter (Signed)
Pt has apt on [address] will address concerns then due to no other contact from pt

## 2023-03-18 ENCOUNTER — Encounter: Payer: Self-pay | Admitting: Family Medicine

## 2023-03-18 ENCOUNTER — Ambulatory Visit: Payer: Medicare PPO | Admitting: Family Medicine

## 2023-03-18 VITALS — BP 112/67 | HR 93 | Temp 97.3°F | Ht 63.0 in | Wt 115.8 lb

## 2023-03-18 DIAGNOSIS — G3184 Mild cognitive impairment, so stated: Secondary | ICD-10-CM | POA: Insufficient documentation

## 2023-03-18 DIAGNOSIS — I480 Paroxysmal atrial fibrillation: Secondary | ICD-10-CM

## 2023-03-18 DIAGNOSIS — R413 Other amnesia: Secondary | ICD-10-CM | POA: Diagnosis not present

## 2023-03-18 DIAGNOSIS — F03B0 Unspecified dementia, moderate, without behavioral disturbance, psychotic disturbance, mood disturbance, and anxiety: Secondary | ICD-10-CM | POA: Insufficient documentation

## 2023-03-18 DIAGNOSIS — N1831 Chronic kidney disease, stage 3a: Secondary | ICD-10-CM | POA: Diagnosis not present

## 2023-03-18 LAB — CBC
Hematocrit: 41.5 % (ref 34.0–46.6)
Hemoglobin: 13.4 g/dL (ref 11.1–15.9)
MCH: 29.9 pg (ref 26.6–33.0)
MCHC: 32.3 g/dL (ref 31.5–35.7)
MCV: 93 fL (ref 79–97)
Platelets: 262 10*3/uL (ref 150–450)
RBC: 4.48 x10E6/uL (ref 3.77–5.28)
RDW: 12.4 % (ref 11.7–15.4)
WBC: 6.7 10*3/uL (ref 3.4–10.8)

## 2023-03-18 LAB — RENAL FUNCTION PANEL
Albumin: 4.1 g/dL (ref 3.6–4.6)
BUN/Creatinine Ratio: 12 (ref 12–28)
BUN: 14 mg/dL (ref 10–36)
CO2: 23 mmol/L (ref 20–29)
Calcium: 10 mg/dL (ref 8.7–10.3)
Chloride: 101 mmol/L (ref 96–106)
Creatinine, Ser: 1.19 mg/dL — ABNORMAL HIGH (ref 0.57–1.00)
Glucose: 82 mg/dL (ref 70–99)
Potassium: 4.3 mmol/L (ref 3.5–5.2)
Sodium: 139 mmol/L (ref 134–144)
eGFR: 43 mL/min/{1.73_m2} — ABNORMAL LOW (ref >59)

## 2023-03-18 LAB — T4, FREE

## 2023-03-18 LAB — VITAMIN B12

## 2023-03-18 LAB — RPR

## 2023-03-18 LAB — TSH

## 2023-03-18 NOTE — Addendum Note (Signed)
Addended by: Raliegh Ip on: 03/18/2023 01:00 PM   Modules accepted: Orders

## 2023-03-18 NOTE — Patient Instructions (Signed)
We discussed that you tested POSITIVE for MILD dementia today.  Dementia can get worse over time.   I recommend that you consider seeing a Neurologist for further testing I also recommend you consider starting medication to help PREVENT this from getting worse.    I recommend Ceravae or cetaphil creams that come in the jar for your skin.  They do a better job at moisturizing than lotions do.  Dementia Dementia is a condition that affects the way the brain works. It often affects memory and thinking. There are many types of dementia. Some types get worse with time and cannot be reversed. Some types of dementia include: Alzheimer's disease. This is the most common type. Vascular dementia. This type may happen due to a stroke. Lewy body dementia. This type may happen to people who have Parkinson's disease. Frontotemporal dementia. This type is caused by damage to nerve cells in certain parts of the brain. Some people may have more than one type. What are the causes? This condition is caused by damage to cells in the brain. Some causes that cannot be reversed include: Having a condition that affects the blood vessels of the brain, such as diabetes, heart disease, or blood vessel disease. Changes to genes. Some causes that can be reversed or slowed include: Injury to the brain. Certain medicines. Infection. Not having enough vitamin B12 in the body, or thyroid problems. A tumor, blood clot, or too much fluid in the brain. Certain diseases that cause your body's defense system (immune system) to attack healthy parts of the body. What are the signs or symptoms? Problems remembering events or people. Having trouble taking a bath or putting clothes on. Forgetting appointments. Forgetting to pay bills. Trouble planning and making meals. Having trouble speaking. Getting lost easily. Changes in behavior or mood. How is this treated? Treatment depends on the cause of the dementia. It might  include: Taking medicines for symptoms or to help control or slow down the dementia. Treating the cause of your dementia. Your doctor can help you find support groups and other doctors who can help with your care. Follow these instructions at home: Medicines Take over-the-counter and prescription medicines only as told by your doctor. Use a pill organizer to help you manage your medicines. Avoidtaking medicines for pain or for sleep. Lifestyle Make healthy choices: Be active as told by your doctor. Do not smoke or use any products that contain nicotine or tobacco. If you need help quitting, ask your doctor. Do not drink alcohol. When you get stressed, do something that will help you relax. Your doctor can give you tips. Spend time with other people. Make sure you get good sleep. To get good sleep: Try not to take naps during the day. Keep your bedroom dark and cool. In the few hours before you go to bed, try not to do any exercise. Do not have foods and drinks with caffeine at night. Eating and drinking Drink enough fluid to keep your pee (urine) pale yellow. Eat a healthy diet. General instructions  Talk with your doctor to figure out: What you need help with. What your safety needs are. Ask your doctor if it is safe for you to drive. If told, wear a bracelet that tracks where you are or shows that you are a person with memory loss. Work with your family to make big decisions. Keep all follow-up visits. Where to find more information Alzheimer's Association: LimitLaws.hu [employer] on Aging: CashCowGambling.be World Health Organization: https://castaneda-walker.com/ Contact  a doctor if: You have any new symptoms. Your symptoms get worse. You have problems with swallowing or choking. Get help right away if: You feel very sad, or feel that you want to harm yourself. Your family members are worried for your safety. Get help right away if you feel like you may hurt yourself or  others, or have thoughts about taking your own life. Go to your nearest emergency room or: Call your local emergency services (911 in the [place] Call the National Suicide Prevention Lifeline at 519-315-8312 or 988 in the [place] This is open 24 hours a day. Text the Crisis Text Line at 775-039-5222. Summary Dementia often affects memory and thinking. Some types of dementia get worse with time and cannot be reversed. Treatment for this condition depends on the cause. Talk with your doctor to figure out what you need help with. Your doctor can help you find support groups and other doctors who can help with your care. This information is not intended to replace advice given to you by your health care provider. Make sure you discuss any questions you have with your health care provider. Document Revised: 02/14/2021 Document Reviewed: 12/06/2019 Elsevier Patient Education  2024 [employer].
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
## 2023-03-31 ENCOUNTER — Ambulatory Visit (INDEPENDENT_AMBULATORY_CARE_PROVIDER_SITE_OTHER): Payer: Medicare PPO

## 2023-03-31 DIAGNOSIS — Z111 Encounter for screening for respiratory tuberculosis: Secondary | ICD-10-CM

## 2023-03-31 NOTE — Progress Notes (Signed)
TB skin test placed to left forearm, patient tolerated well.  Appointment scheduled for patient to come back on 04/02/23 to have TB test read.

## 2023-03-31 NOTE — Telephone Encounter (Signed)
Ok to [place] a [employer] do a TB skin test to assist with this.  The FL2 should be on your desk

## 2023-04-02 ENCOUNTER — Ambulatory Visit: Payer: Medicare PPO

## 2023-04-02 DIAGNOSIS — Z111 Encounter for screening for respiratory tuberculosis: Secondary | ICD-10-CM

## 2023-04-02 LAB — TB SKIN TEST
Induration: 0 mm
TB Skin Test: NEGATIVE

## 2023-04-02 NOTE — Progress Notes (Signed)
Patient came in today to have TB read. Tb negative. Copy given to patients caregiver

## 2023-04-14 ENCOUNTER — Other Ambulatory Visit: Payer: Self-pay | Admitting: Family Medicine

## 2023-04-14 DIAGNOSIS — I480 Paroxysmal atrial fibrillation: Secondary | ICD-10-CM

## 2023-04-18 DIAGNOSIS — F0390 Unspecified dementia without behavioral disturbance: Secondary | ICD-10-CM | POA: Diagnosis not present

## 2023-04-18 DIAGNOSIS — I6930 Unspecified sequelae of cerebral infarction: Secondary | ICD-10-CM | POA: Diagnosis not present

## 2023-04-22 DIAGNOSIS — E785 Hyperlipidemia, unspecified: Secondary | ICD-10-CM | POA: Diagnosis not present

## 2023-04-22 DIAGNOSIS — N189 Chronic kidney disease, unspecified: Secondary | ICD-10-CM | POA: Diagnosis not present

## 2023-04-22 DIAGNOSIS — I4891 Unspecified atrial fibrillation: Secondary | ICD-10-CM | POA: Diagnosis not present

## 2023-04-22 DIAGNOSIS — Z8673 Personal history of transient ischemic attack (TIA), and cerebral infarction without residual deficits: Secondary | ICD-10-CM | POA: Diagnosis not present

## 2023-04-22 DIAGNOSIS — D649 Anemia, unspecified: Secondary | ICD-10-CM | POA: Diagnosis not present

## 2023-04-22 DIAGNOSIS — F0393 Unspecified dementia, unspecified severity, with mood disturbance: Secondary | ICD-10-CM | POA: Diagnosis not present

## 2023-04-23 DIAGNOSIS — D649 Anemia, unspecified: Secondary | ICD-10-CM | POA: Diagnosis not present

## 2023-04-23 DIAGNOSIS — F0390 Unspecified dementia without behavioral disturbance: Secondary | ICD-10-CM | POA: Diagnosis not present

## 2023-04-24 DIAGNOSIS — E039 Hypothyroidism, unspecified: Secondary | ICD-10-CM | POA: Diagnosis not present

## 2023-04-24 DIAGNOSIS — E559 Vitamin D deficiency, unspecified: Secondary | ICD-10-CM | POA: Diagnosis not present

## 2023-04-24 DIAGNOSIS — E785 Hyperlipidemia, unspecified: Secondary | ICD-10-CM | POA: Diagnosis not present

## 2023-04-24 DIAGNOSIS — E119 Type 2 diabetes mellitus without complications: Secondary | ICD-10-CM | POA: Diagnosis not present

## 2023-04-24 DIAGNOSIS — I10 Essential (primary) hypertension: Secondary | ICD-10-CM | POA: Diagnosis not present

## 2023-05-16 DIAGNOSIS — I6930 Unspecified sequelae of cerebral infarction: Secondary | ICD-10-CM | POA: Diagnosis not present

## 2023-05-16 DIAGNOSIS — F0150 Vascular dementia without behavioral disturbance: Secondary | ICD-10-CM | POA: Diagnosis not present

## 2023-05-20 DIAGNOSIS — N183 Chronic kidney disease, stage 3 unspecified: Secondary | ICD-10-CM | POA: Diagnosis not present

## 2023-05-20 DIAGNOSIS — I480 Paroxysmal atrial fibrillation: Secondary | ICD-10-CM | POA: Diagnosis not present

## 2023-05-20 DIAGNOSIS — E559 Vitamin D deficiency, unspecified: Secondary | ICD-10-CM | POA: Diagnosis not present

## 2023-05-20 DIAGNOSIS — E7849 Other hyperlipidemia: Secondary | ICD-10-CM | POA: Diagnosis not present

## 2023-05-27 ENCOUNTER — Ambulatory Visit: Payer: Medicare PPO | Admitting: Family Medicine

## 2023-05-27 DIAGNOSIS — D649 Anemia, unspecified: Secondary | ICD-10-CM | POA: Diagnosis not present

## 2023-05-27 DIAGNOSIS — F0390 Unspecified dementia without behavioral disturbance: Secondary | ICD-10-CM | POA: Diagnosis not present

## 2023-05-28 DIAGNOSIS — N183 Chronic kidney disease, stage 3 unspecified: Secondary | ICD-10-CM | POA: Diagnosis not present

## 2023-05-28 DIAGNOSIS — I480 Paroxysmal atrial fibrillation: Secondary | ICD-10-CM | POA: Diagnosis not present

## 2023-05-28 DIAGNOSIS — E559 Vitamin D deficiency, unspecified: Secondary | ICD-10-CM | POA: Diagnosis not present

## 2023-06-10 DIAGNOSIS — B351 Tinea unguium: Secondary | ICD-10-CM | POA: Diagnosis not present

## 2023-06-10 DIAGNOSIS — M2041 Other hammer toe(s) (acquired), right foot: Secondary | ICD-10-CM | POA: Diagnosis not present

## 2023-06-10 DIAGNOSIS — M79675 Pain in left toe(s): Secondary | ICD-10-CM | POA: Diagnosis not present

## 2023-06-10 DIAGNOSIS — N189 Chronic kidney disease, unspecified: Secondary | ICD-10-CM | POA: Diagnosis not present

## 2023-06-17 DIAGNOSIS — I480 Paroxysmal atrial fibrillation: Secondary | ICD-10-CM | POA: Diagnosis not present

## 2023-06-17 DIAGNOSIS — D649 Anemia, unspecified: Secondary | ICD-10-CM | POA: Diagnosis not present

## 2023-06-17 DIAGNOSIS — E559 Vitamin D deficiency, unspecified: Secondary | ICD-10-CM | POA: Diagnosis not present

## 2023-06-17 DIAGNOSIS — F0150 Vascular dementia without behavioral disturbance: Secondary | ICD-10-CM | POA: Diagnosis not present

## 2023-06-23 ENCOUNTER — Ambulatory Visit: Payer: Medicare PPO | Admitting: Neurology

## 2023-06-23 ENCOUNTER — Encounter: Payer: Self-pay | Admitting: Neurology

## 2023-06-23 VITALS — BP 118/74 | Ht 63.0 in | Wt 130.0 lb

## 2023-06-23 DIAGNOSIS — F03B0 Unspecified dementia, moderate, without behavioral disturbance, psychotic disturbance, mood disturbance, and anxiety: Secondary | ICD-10-CM | POA: Diagnosis not present

## 2023-06-23 MED ORDER — MEMANTINE HCL 10 MG PO TABS: 10.0000 mg | ORAL_TABLET | Freq: Two times a day (BID) | ORAL | 11 refills | Status: DC

## 2023-06-23 NOTE — Progress Notes (Signed)
Chief Complaint  Patient presents with   New Patient (Initial Visit)    Rm 14, NP for [facility], with cousin [name]      ASSESSMENT AND PLAN  [name] is a 89 y.o. female   Cognitive impairment History of atrial fibrillation, on Xarelto  Mini-Mental status examination was 22/30 13 March 2023,  Reviewed MRI of the brain from October 2014, remote small left insular cortex corona radiata infarction, mild small vessel disease, generalized atrophy, no acute abnormality  Laboratory evaluations in August 2024 showed mild abnormal kidney function,  GFR 43, otherwise normal or negative RPR, CBC, B12, TSH,  Most consistent with central nervous system degenerative disorder, such as Alzheimer's disease,  Discussed with patient and her power of attorney cousin in [place], decided to hold off MRI study, Namenda 10 mg twice a day,  Continue follow-up with primary care and return to clinic for new issues  DIAGNOSTIC DATA (LABS, IMAGING, TESTING) - I reviewed patient records, labs, notes, testing and imaging myself where available.   MEDICAL HISTORY:  [name], is a 89 year old female, seen in request by her primary care at [facility], [place] for evaluation of memory loss, she is accompanied by her cousin [name], who is also her power of attorney, at today's visit on June 23, 2023  History is obtained from the patient and review of electronic medical records. I personally reviewed pertinent available imaging films in PACS.   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 license, eventually her cousin in law [name] become her power of attorney, she was moved to [facility] since August 2024 memory unit, physically she is doing well, ambulate without any difficulties, but have significant  short-term memory loss, tends to feel anxious, called [name] 6 times to check on her pocketbook this morning, but herself did not notice a memory deficit  Mini-Mental Status Examination was 22/30 in August 2024  I saw her previously in 2014 for acute onset of vertigo, thought due to benign positional vertigo improved with repositioning maneuver  Had MRI of the brain done, which showed small lacunar infarction involving left insular cortex corona radiator, vascular risk factor of atrial fibrillation, on Xarelto  There was no acute worsening focal deficit, discussed with patient and Florentina Addison about the need for repeat MRI of the brain, they both decided to hold it off, will start Namenda 10 mg twice a day    PHYSICAL EXAM:   Vitals:   06/23/23 1406  BP: 118/74  Weight: 130 lb (59 kg)  Height: 5\' 3"  (1.6 m)   Not recorded     Body mass index is 23.03 kg/m.  PHYSICAL EXAMNIATION:  Gen: NAD, conversant, well nourised, well groomed                     Cardiovascular: Regular rate rhythm, no peripheral edema, warm, nontender. Eyes: Conjunctivae clear without exudates or hemorrhage Neck: Supple, no carotid bruits. Pulmonary: Clear to auscultation bilaterally   NEUROLOGICAL EXAM:  MENTAL STATUS: Speech/cognition: Awake, alert, oriented to history taking and casual conversation\    03/18/2023    8:55 AM 03/08/2022    2:15 PM 04/13/2018    9:15 AM  MMSE - Mini Mental State Exam  Orientation to time 1 3 5  Orientation to Place 5 5 5   Registration 3 3 3   Attention/ Calculation 5 5 5   Recall 0 1 2  Language- name 2 objects 2 2 2   Language- repeat 1 1 1   Language- follow 3 step command 3 3 3   Language- read & follow direction 1 1 1   Write a sentence 1 1 1   Copy design 0 0 1  Total score 22 25 29     CRANIAL NERVES: CN II: Visual fields are full to confrontation. Pupils are round equal and briskly reactive to light. CN III, IV, VI: extraocular movement are normal. No ptosis. CN V:  Facial sensation is intact to light touch CN VII: Face is symmetric with normal eye closure  CN VIII: Hearing is normal to causal conversation. CN IX, X: Phonation is normal. CN XI: Head turning and shoulder shrug are intact  MOTOR: There is no pronator drift of out-stretched arms. Muscle bulk and tone are normal. Muscle strength is normal.  REFLEXES: Reflexes are 1 and symmetric at the biceps, triceps, knees, and ankles. Plantar responses are flexor.  SENSORY: Intact to light touch, pinprick and vibratory sensation are intact in fingers and toes.  COORDINATION: There is no trunk or limb dysmetria noted.  GAIT/STANCE: Posture is normal. Gait is steady    REVIEW OF SYSTEMS:  Full 14 system review of systems performed and notable only for as above All other review of systems were negative.   ALLERGIES: No Known Allergies  HOME MEDICATIONS: Current Outpatient Medications  Medication Sig Dispense Refill   acetaminophen (TYLENOL) 500 MG tablet Take 1-2 tablets (500-1,000 mg total) by mouth 3 (three) times daily as needed (arthritis pain). 30 tablet 0   Ascorbic Acid (VITAMIN C) 500 MG CAPS Take 500 mg by mouth daily.     atorvastatin (LIPITOR) 10 MG tablet TAKE 1/2 TABLET BY MOUTH ONCE A DAY OR AS INSTRUCTED BY PHYSICIAN 45 tablet 1   Calcium Carb-Cholecalciferol 600-800 MG-UNIT TABS Take by mouth daily.     Cholecalciferol (VITAMIN D3) 1000 UNITS CAPS Take 1 capsule by mouth daily.     diltiazem (CARDIZEM CD) 180 MG 24 hr capsule TAKE 1 CAPSULE BY MOUTH EVERY DAY 90 capsule 1   folic acid (FOLVITE) 800 MCG tablet Take 400 mcg by mouth daily.     Magnesium 500 MG TABS Take 1 tablet by mouth daily.     Multiple Vitamin (MULTIVITAMIN) capsule Take 1 capsule by mouth daily.       Rivaroxaban (XARELTO) 15 MG TABS tablet TAKE 1 TABLET (15 MG TOTAL) BY MOUTH DAILY. 90 tablet 0   No current facility-administered medications for this visit.    PAST MEDICAL HISTORY: Past Medical  History:  Diagnosis Date   Arthritis    AV malformation of gastrointestinal tract    in cecum she denies history of GIB   Breast mass, right    Cataract    CVA (cerebral vascular accident) (HCC) 2007   Dizziness    denies this as an ongoing problem   Hyperlipidemia    Microcytic anemia    Osteopenia    Persistent atrial fibrillation (HCC)    s/p PVI 04/2010  CHADS2vasc at least 5   RLS (restless legs syndrome)    Sinus bradycardia     PAST SURGICAL HISTORY: Past Surgical History:  Procedure Laterality Date   afib ablation  04/2010   PVI with CTI ablation performed by [facility]   BREAST EXCISIONAL BIOPSY Right    benign cyst  breast mass resected     CARDIOVERSION     for afib   CATARACT EXTRACTION W/PHACO  11/11/2011   Procedure: CATARACT EXTRACTION PHACO AND INTRAOCULAR LENS PLACEMENT (IOC);  Surgeon: Gemma Payor, MD;  Location: [facility];  Service: Ophthalmology;  Laterality: Left;  CDE:12.62   CATARACT EXTRACTION W/PHACO  11/25/2011   Procedure: CATARACT EXTRACTION PHACO AND INTRAOCULAR LENS PLACEMENT (IOC);  Surgeon: Gemma Payor, MD;  Location: [facility];  Service: Ophthalmology;  Laterality: Right;  CDE 16.14   dental inplants      FAMILY HISTORY: Family History  Problem Relation Age of Onset   Stroke Mother 45   Hip fracture Mother    Lung cancer Father 57   Anesthesia problems Neg Hx    Hypotension Neg Hx    Malignant hyperthermia Neg Hx    Pseudochol deficiency Neg Hx    Breast cancer Neg Hx     SOCIAL HISTORY: Social History   Socioeconomic History   Marital status: Widowed    Spouse name: Not on file   Number of children: 0   Years of education: college   Highest education level: Not on file  Occupational History   Occupation: retired  Tobacco Use   Smoking status: Former    Current packs/day: 0.00    Average packs/day: 1 pack/day for 30.0 years (30.0 ttl pk-yrs)    Types: Cigarettes    Start date: 08/05/1948    Quit date: 08/05/1978    Years since quitting: 44.9    Smokeless tobacco: Never   Tobacco comments:    quit in 1980s  Vaping Use   Vaping status: Never Used  Substance and Sexual Activity   Alcohol use: No   Drug use: No   Sexual activity: Never  Other Topics Concern   Not on file  Social History Narrative   Patient is retired and lives at [facility]    Cousin [place] and his wife Purnell Shoemaker are her closest living relatives.  They are who will make medical decisions for her if she is unable.   Patient has business college education. Right handed.    Caffeine- some times - soda    Daily exercise at [facility] - walks a mile a day at home.    Church every Sunday   Likes to go on trips with the rec center   Wears hearing aids/ dentures   Social Determinants of Health   Financial Resource Strain: Low Risk  (06/21/2022)   Overall Financial Resource Strain (CARDIA)    Difficulty of Paying Living Expenses: Not hard at all  Food Insecurity: No Food Insecurity (06/21/2022)   Hunger Vital Sign    Worried About Running Out of Food in the Last Year: Never true    Ran Out of Food in the Last Year: Never true  Transportation Needs: No Transportation Needs (06/21/2022)   PRAPARE - Administrator, Civil Service (Medical): No    Lack of Transportation (Non-Medical): No  Physical Activity: Sufficiently Active (06/21/2022)   Exercise Vital Sign    Days of Exercise per Week: 5 days    Minutes of Exercise per Session: 60 min  Stress: No Stress Concern Present (06/21/2022)   [employer] of Occupational Health - Occupational Stress Questionnaire    Feeling of Stress : Not at all  Social Connections: Moderately Integrated (06/21/2022)   Social Connection and Isolation Panel [NHANES]    Frequency of Communication with Friends and Family: More than three times a  week    Frequency of Social Gatherings with Friends and Family: Three times a week    Attends Religious Services: More than 4 times per year    Active Member of Clubs or  Organizations: Yes    Attends Banker Meetings: More than 4 times per year    Marital Status: Widowed  Intimate Partner Violence: Not At Risk (06/21/2022)   Humiliation, Afraid, Rape, and Kick questionnaire    Fear of Current or Ex-Partner: No    Emotionally Abused: No    Physically Abused: No    Sexually Abused: No      Levert Feinstein, M.D. Ph.D.  [facility] [address] [place], [place] [address] Ph: [phone] Fax: [phone]  CC:  Raliegh Ip, DO [address],  [place] [address]  Raliegh Ip, DO

## 2023-07-09 ENCOUNTER — Other Ambulatory Visit: Payer: Self-pay | Admitting: Family Medicine

## 2023-07-09 DIAGNOSIS — E7800 Pure hypercholesterolemia, unspecified: Secondary | ICD-10-CM

## 2023-07-09 DIAGNOSIS — I480 Paroxysmal atrial fibrillation: Secondary | ICD-10-CM

## 2023-07-15 DIAGNOSIS — N183 Chronic kidney disease, stage 3 unspecified: Secondary | ICD-10-CM | POA: Diagnosis not present

## 2023-07-15 DIAGNOSIS — E7849 Other hyperlipidemia: Secondary | ICD-10-CM | POA: Diagnosis not present

## 2023-07-15 DIAGNOSIS — E559 Vitamin D deficiency, unspecified: Secondary | ICD-10-CM | POA: Diagnosis not present

## 2023-07-15 DIAGNOSIS — F0150 Vascular dementia without behavioral disturbance: Secondary | ICD-10-CM | POA: Diagnosis not present

## 2023-07-21 DIAGNOSIS — F0390 Unspecified dementia without behavioral disturbance: Secondary | ICD-10-CM | POA: Diagnosis not present

## 2023-07-21 DIAGNOSIS — D649 Anemia, unspecified: Secondary | ICD-10-CM | POA: Diagnosis not present

## 2023-08-14 DIAGNOSIS — D649 Anemia, unspecified: Secondary | ICD-10-CM | POA: Diagnosis not present

## 2023-08-14 DIAGNOSIS — I480 Paroxysmal atrial fibrillation: Secondary | ICD-10-CM | POA: Diagnosis not present

## 2023-08-14 DIAGNOSIS — F0150 Vascular dementia without behavioral disturbance: Secondary | ICD-10-CM | POA: Diagnosis not present

## 2023-09-16 DIAGNOSIS — E7849 Other hyperlipidemia: Secondary | ICD-10-CM | POA: Diagnosis not present

## 2023-09-16 DIAGNOSIS — N183 Chronic kidney disease, stage 3 unspecified: Secondary | ICD-10-CM | POA: Diagnosis not present

## 2023-09-16 DIAGNOSIS — F0150 Vascular dementia without behavioral disturbance: Secondary | ICD-10-CM | POA: Diagnosis not present

## 2023-09-16 DIAGNOSIS — E559 Vitamin D deficiency, unspecified: Secondary | ICD-10-CM | POA: Diagnosis not present

## 2023-09-25 DIAGNOSIS — I639 Cerebral infarction, unspecified: Secondary | ICD-10-CM | POA: Diagnosis not present

## 2023-09-25 DIAGNOSIS — E785 Hyperlipidemia, unspecified: Secondary | ICD-10-CM | POA: Diagnosis not present

## 2023-10-16 DIAGNOSIS — D649 Anemia, unspecified: Secondary | ICD-10-CM | POA: Diagnosis not present

## 2023-10-16 DIAGNOSIS — F0150 Vascular dementia without behavioral disturbance: Secondary | ICD-10-CM | POA: Diagnosis not present

## 2023-10-16 DIAGNOSIS — I480 Paroxysmal atrial fibrillation: Secondary | ICD-10-CM | POA: Diagnosis not present

## 2023-10-23 DIAGNOSIS — F39 Unspecified mood [affective] disorder: Secondary | ICD-10-CM | POA: Diagnosis not present

## 2023-10-23 DIAGNOSIS — F0390 Unspecified dementia without behavioral disturbance: Secondary | ICD-10-CM | POA: Diagnosis not present

## 2023-11-01 ENCOUNTER — Inpatient Hospital Stay (HOSPITAL_COMMUNITY)
Admission: EM | Admit: 2023-11-01 | Discharge: 2023-11-05 | DRG: 543 | Disposition: A | Discharge status: Skilled Nursing Facility | Source: Skilled Nursing Facility | Attending: Internal Medicine | Admitting: Internal Medicine

## 2023-11-01 ENCOUNTER — Emergency Department (HOSPITAL_COMMUNITY)

## 2023-11-01 ENCOUNTER — Other Ambulatory Visit: Payer: Self-pay

## 2023-11-01 ENCOUNTER — Encounter (HOSPITAL_COMMUNITY): Payer: Self-pay | Admitting: Emergency Medicine

## 2023-11-01 DIAGNOSIS — I4891 Unspecified atrial fibrillation: Secondary | ICD-10-CM | POA: Diagnosis not present

## 2023-11-01 DIAGNOSIS — S32402A Unspecified fracture of left acetabulum, initial encounter for closed fracture: Principal | ICD-10-CM | POA: Diagnosis present

## 2023-11-01 DIAGNOSIS — S32592A Other specified fracture of left pubis, initial encounter for closed fracture: Secondary | ICD-10-CM | POA: Diagnosis not present

## 2023-11-01 DIAGNOSIS — R488 Other symbolic dysfunctions: Secondary | ICD-10-CM | POA: Diagnosis not present

## 2023-11-01 DIAGNOSIS — D72828 Other elevated white blood cell count: Secondary | ICD-10-CM | POA: Diagnosis not present

## 2023-11-01 DIAGNOSIS — Z79899 Other long term (current) drug therapy: Secondary | ICD-10-CM

## 2023-11-01 DIAGNOSIS — M800B2A Age-related osteoporosis with current pathological fracture, left pelvis, initial encounter for fracture: Principal | ICD-10-CM | POA: Diagnosis present

## 2023-11-01 DIAGNOSIS — S73102A Unspecified sprain of left hip, initial encounter: Secondary | ICD-10-CM | POA: Diagnosis not present

## 2023-11-01 DIAGNOSIS — S32482A Displaced dome fracture of left acetabulum, initial encounter for closed fracture: Secondary | ICD-10-CM | POA: Diagnosis not present

## 2023-11-01 DIAGNOSIS — N1832 Chronic kidney disease, stage 3b: Secondary | ICD-10-CM | POA: Diagnosis present

## 2023-11-01 DIAGNOSIS — Z8673 Personal history of transient ischemic attack (TIA), and cerebral infarction without residual deficits: Secondary | ICD-10-CM | POA: Diagnosis not present

## 2023-11-01 DIAGNOSIS — I517 Cardiomegaly: Secondary | ICD-10-CM | POA: Diagnosis not present

## 2023-11-01 DIAGNOSIS — Z23 Encounter for immunization: Secondary | ICD-10-CM

## 2023-11-01 DIAGNOSIS — D649 Anemia, unspecified: Secondary | ICD-10-CM | POA: Diagnosis not present

## 2023-11-01 DIAGNOSIS — Z9181 History of falling: Secondary | ICD-10-CM

## 2023-11-01 DIAGNOSIS — I700 Atherosclerosis of aorta: Secondary | ICD-10-CM | POA: Diagnosis not present

## 2023-11-01 DIAGNOSIS — S338XXA Sprain of other parts of lumbar spine and pelvis, initial encounter: Secondary | ICD-10-CM | POA: Diagnosis not present

## 2023-11-01 DIAGNOSIS — R69 Illness, unspecified: Secondary | ICD-10-CM | POA: Diagnosis not present

## 2023-11-01 DIAGNOSIS — Z823 Family history of stroke: Secondary | ICD-10-CM

## 2023-11-01 DIAGNOSIS — S32511D Fracture of superior rim of right pubis, subsequent encounter for fracture with routine healing: Secondary | ICD-10-CM | POA: Diagnosis not present

## 2023-11-01 DIAGNOSIS — I482 Chronic atrial fibrillation, unspecified: Secondary | ICD-10-CM | POA: Diagnosis not present

## 2023-11-01 DIAGNOSIS — S32592D Other specified fracture of left pubis, subsequent encounter for fracture with routine healing: Secondary | ICD-10-CM | POA: Diagnosis not present

## 2023-11-01 DIAGNOSIS — R531 Weakness: Secondary | ICD-10-CM | POA: Diagnosis not present

## 2023-11-01 DIAGNOSIS — F39 Unspecified mood [affective] disorder: Secondary | ICD-10-CM | POA: Diagnosis not present

## 2023-11-01 DIAGNOSIS — Z7901 Long term (current) use of anticoagulants: Secondary | ICD-10-CM

## 2023-11-01 DIAGNOSIS — S32511A Fracture of superior rim of right pubis, initial encounter for closed fracture: Secondary | ICD-10-CM | POA: Diagnosis not present

## 2023-11-01 DIAGNOSIS — J984 Other disorders of lung: Secondary | ICD-10-CM | POA: Diagnosis not present

## 2023-11-01 DIAGNOSIS — Z801 Family history of malignant neoplasm of trachea, bronchus and lung: Secondary | ICD-10-CM | POA: Diagnosis not present

## 2023-11-01 DIAGNOSIS — N183 Chronic kidney disease, stage 3 unspecified: Secondary | ICD-10-CM | POA: Diagnosis present

## 2023-11-01 DIAGNOSIS — F03B0 Unspecified dementia, moderate, without behavioral disturbance, psychotic disturbance, mood disturbance, and anxiety: Secondary | ICD-10-CM | POA: Diagnosis not present

## 2023-11-01 DIAGNOSIS — R262 Difficulty in walking, not elsewhere classified: Secondary | ICD-10-CM | POA: Diagnosis not present

## 2023-11-01 DIAGNOSIS — I959 Hypotension, unspecified: Secondary | ICD-10-CM | POA: Diagnosis not present

## 2023-11-01 DIAGNOSIS — S0990XA Unspecified injury of head, initial encounter: Secondary | ICD-10-CM | POA: Diagnosis not present

## 2023-11-01 DIAGNOSIS — S32402D Unspecified fracture of left acetabulum, subsequent encounter for fracture with routine healing: Secondary | ICD-10-CM | POA: Diagnosis not present

## 2023-11-01 DIAGNOSIS — I6782 Cerebral ischemia: Secondary | ICD-10-CM | POA: Diagnosis not present

## 2023-11-01 DIAGNOSIS — Z87891 Personal history of nicotine dependence: Secondary | ICD-10-CM

## 2023-11-01 DIAGNOSIS — Z7401 Bed confinement status: Secondary | ICD-10-CM | POA: Diagnosis not present

## 2023-11-01 DIAGNOSIS — G2581 Restless legs syndrome: Secondary | ICD-10-CM | POA: Diagnosis not present

## 2023-11-01 DIAGNOSIS — M25552 Pain in left hip: Secondary | ICD-10-CM | POA: Diagnosis present

## 2023-11-01 DIAGNOSIS — S299XXA Unspecified injury of thorax, initial encounter: Secondary | ICD-10-CM | POA: Diagnosis not present

## 2023-11-01 DIAGNOSIS — R911 Solitary pulmonary nodule: Secondary | ICD-10-CM | POA: Diagnosis present

## 2023-11-01 DIAGNOSIS — S32401A Unspecified fracture of right acetabulum, initial encounter for closed fracture: Secondary | ICD-10-CM | POA: Diagnosis not present

## 2023-11-01 DIAGNOSIS — I4819 Other persistent atrial fibrillation: Secondary | ICD-10-CM | POA: Diagnosis present

## 2023-11-01 DIAGNOSIS — W19XXXA Unspecified fall, initial encounter: Secondary | ICD-10-CM

## 2023-11-01 DIAGNOSIS — E785 Hyperlipidemia, unspecified: Secondary | ICD-10-CM | POA: Diagnosis present

## 2023-11-01 DIAGNOSIS — D62 Acute posthemorrhagic anemia: Secondary | ICD-10-CM | POA: Diagnosis not present

## 2023-11-01 DIAGNOSIS — F0150 Vascular dementia without behavioral disturbance: Secondary | ICD-10-CM | POA: Diagnosis not present

## 2023-11-01 DIAGNOSIS — D509 Iron deficiency anemia, unspecified: Secondary | ICD-10-CM | POA: Diagnosis not present

## 2023-11-01 DIAGNOSIS — S32512A Fracture of superior rim of left pubis, initial encounter for closed fracture: Secondary | ICD-10-CM | POA: Diagnosis not present

## 2023-11-01 DIAGNOSIS — M8000XA Age-related osteoporosis with current pathological fracture, unspecified site, initial encounter for fracture: Secondary | ICD-10-CM | POA: Diagnosis not present

## 2023-11-01 HISTORY — DX: Unspecified dementia, unspecified severity, without behavioral disturbance, psychotic disturbance, mood disturbance, and anxiety: F03.90

## 2023-11-01 LAB — CBC WITH DIFFERENTIAL/PLATELET
Abs Immature Granulocytes: 0.16 10*3/uL — ABNORMAL HIGH (ref 0.00–0.07)
Basophils Absolute: 0 10*3/uL (ref 0.0–0.1)
Basophils Relative: 0 %
Eosinophils Absolute: 0 10*3/uL (ref 0.0–0.5)
Eosinophils Relative: 0 %
HCT: 44.1 % (ref 36.0–46.0)
Hemoglobin: 14 g/dL (ref 12.0–15.0)
Immature Granulocytes: 1 %
Lymphocytes Relative: 8 %
Lymphs Abs: 1.3 10*3/uL (ref 0.7–4.0)
MCH: 30.4 pg (ref 26.0–34.0)
MCHC: 31.7 g/dL (ref 30.0–36.0)
MCV: 95.9 fL (ref 80.0–100.0)
Monocytes Absolute: 1.3 10*3/uL — ABNORMAL HIGH (ref 0.1–1.0)
Monocytes Relative: 8 %
Neutro Abs: 13.8 10*3/uL — ABNORMAL HIGH (ref 1.7–7.7)
Neutrophils Relative %: 83 %
Platelets: 253 10*3/uL (ref 150–400)
RBC: 4.6 MIL/uL (ref 3.87–5.11)
RDW: 13.5 % (ref 11.5–15.5)
WBC: 16.6 10*3/uL — ABNORMAL HIGH (ref 4.0–10.5)
nRBC: 0 % (ref 0.0–0.2)

## 2023-11-01 LAB — BASIC METABOLIC PANEL WITH GFR
Anion gap: 9 (ref 5–15)
BUN: 26 mg/dL — ABNORMAL HIGH (ref 8–23)
CO2: 23 mmol/L (ref 22–32)
Calcium: 9.9 mg/dL (ref 8.9–10.3)
Chloride: 104 mmol/L (ref 98–111)
Creatinine, Ser: 1.18 mg/dL — ABNORMAL HIGH (ref 0.44–1.00)
GFR, Estimated: 44 mL/min — ABNORMAL LOW (ref >60)
Glucose, Bld: 125 mg/dL — ABNORMAL HIGH (ref 70–99)
Potassium: 4 mmol/L (ref 3.5–5.1)
Sodium: 136 mmol/L (ref 135–145)

## 2023-11-01 LAB — TYPE AND SCREEN
ABO/RH(D): AB POS
Antibody Screen: NEGATIVE

## 2023-11-01 LAB — PROTIME-INR
INR: 1.4 — ABNORMAL HIGH (ref 0.8–1.2)
Prothrombin Time: 17.6 s — ABNORMAL HIGH (ref 11.4–15.2)

## 2023-11-01 MED ORDER — OXYCODONE HCL 5 MG PO TABS
2.5000 mg | ORAL_TABLET | ORAL | Status: DC | PRN
  Administered 2023-11-02 – 2023-11-05 (×9): 5 mg via ORAL
  Filled 2023-11-01 (×9): qty 1

## 2023-11-01 MED ORDER — METHOCARBAMOL 500 MG PO TABS
500.0000 mg | ORAL_TABLET | Freq: Four times a day (QID) | ORAL | Status: DC | PRN
  Administered 2023-11-03 – 2023-11-05 (×2): 500 mg via ORAL
  Filled 2023-11-01 (×2): qty 1

## 2023-11-01 MED ORDER — FENTANYL CITRATE PF 50 MCG/ML IJ SOSY
12.5000 ug | PREFILLED_SYRINGE | INTRAMUSCULAR | Status: DC | PRN
  Administered 2023-11-01: 50 ug via INTRAVENOUS
  Filled 2023-11-01: qty 1

## 2023-11-01 MED ORDER — ATORVASTATIN CALCIUM 10 MG PO TABS
10.0000 mg | ORAL_TABLET | Freq: Every day | ORAL | Status: DC
Start: 2023-11-02 — End: 2023-11-05
  Administered 2023-11-02 – 2023-11-05 (×4): 10 mg via ORAL
  Filled 2023-11-01 (×4): qty 1

## 2023-11-01 MED ORDER — MORPHINE SULFATE (PF) 4 MG/ML IV SOLN
4.0000 mg | Freq: Once | INTRAVENOUS | Status: AC
  Administered 2023-11-01: 4 mg via INTRAVENOUS
  Filled 2023-11-01: qty 1

## 2023-11-01 MED ORDER — POLYETHYLENE GLYCOL 3350 17 G PO PACK
17.0000 g | PACK | Freq: Every day | ORAL | Status: DC | PRN
  Administered 2023-11-05: 17 g via ORAL
  Filled 2023-11-01: qty 1

## 2023-11-01 MED ORDER — ORAL CARE MOUTH RINSE: 15.0000 mL | OROMUCOSAL | Status: DC | PRN

## 2023-11-01 MED ORDER — ORAL CARE MOUTH RINSE: 15.0000 mL | OROMUCOSAL | Status: DC

## 2023-11-01 MED ORDER — MEMANTINE HCL 10 MG PO TABS
10.0000 mg | ORAL_TABLET | Freq: Two times a day (BID) | ORAL | Status: DC
  Administered 2023-11-01 – 2023-11-05 (×8): 10 mg via ORAL
  Filled 2023-11-01 (×8): qty 1

## 2023-11-01 MED ORDER — DILTIAZEM HCL 25 MG/5ML IV SOLN
10.0000 mg | Freq: Once | INTRAVENOUS | Status: AC
  Administered 2023-11-01: 10 mg via INTRAVENOUS
  Filled 2023-11-01: qty 5

## 2023-11-01 MED ORDER — DILTIAZEM HCL-DEXTROSE 125-5 MG/125ML-% IV SOLN (PREMIX)
5.0000 mg/h | INTRAVENOUS | Status: DC
  Administered 2023-11-01: 5 mg/h via INTRAVENOUS
  Filled 2023-11-01: qty 125

## 2023-11-01 MED ORDER — DILTIAZEM HCL ER COATED BEADS 180 MG PO CP24: 180.0000 mg | ORAL_CAPSULE | Freq: Every day | ORAL | Status: DC

## 2023-11-01 MED ORDER — PROCHLORPERAZINE EDISYLATE 10 MG/2ML IJ SOLN: 5.0000 mg | Freq: Four times a day (QID) | INTRAMUSCULAR | Status: DC | PRN

## 2023-11-01 MED ORDER — SODIUM CHLORIDE 0.9 % IV SOLN: INTRAVENOUS | Status: AC

## 2023-11-01 MED ORDER — FENTANYL CITRATE PF 50 MCG/ML IJ SOSY
50.0000 ug | PREFILLED_SYRINGE | INTRAMUSCULAR | Status: AC | PRN
  Administered 2023-11-01 (×2): 50 ug via INTRAVENOUS
  Filled 2023-11-01 (×2): qty 1

## 2023-11-01 NOTE — ED Provider Notes (Signed)
 [place] EMERGENCY DEPARTMENT AT [facility] Provider Note   CSN: [id number] Arrival date & time: 11/01/23  1624     History {Add pertinent medical, surgical, social history, OB history to HPI:1} Chief Complaint  Patient presents with   [name] is a 89 y.o. female.  She is brought in by ambulance from her home at [facility] living.  She said she was walking and fell and injured her left hip.  She denies loss of consciousness did not hit her head.  She is not sure why she fell.  She said she was able to stand afterwards but her hip hurts a lot.  She has a history of atrial fibrillation and is on rivaroxaban.   The history is provided by the patient.  Fall This is a new problem. The current episode started 1 to 2 hours ago. The problem has not changed since onset.Pertinent negatives include no chest pain, no abdominal pain, no headaches and no shortness of breath. The symptoms are aggravated by bending and twisting. Nothing relieves the symptoms. She has tried rest for the symptoms. The treatment provided no relief.       Home Medications Prior to Admission medications   Medication Sig Start Date End Date Taking? Authorizing Provider  acetaminophen (TYLENOL) 500 MG tablet Take 1-2 tablets (500-1,000 mg total) by mouth 3 (three) times daily as needed (arthritis pain). 05/07/16   Eckard, Tammy, RPH-CPP  Ascorbic Acid (VITAMIN C) 500 MG CAPS Take 500 mg by mouth daily.    [provider]  atorvastatin (LIPITOR) 10 MG tablet TAKE 1/2 TABLET BY MOUTH ONCE A DAY OR AS INSTRUCTED BY PHYSICIAN 10/17/22   Delynn Flavin M, DO  Calcium Carb-Cholecalciferol 600-800 MG-UNIT TABS Take by mouth daily.    [provider]  Cholecalciferol (VITAMIN D3) 1000 UNITS CAPS Take 1 capsule by mouth daily.    [provider]  diltiazem (CARDIZEM CD) 180 MG 24 hr capsule TAKE 1 CAPSULE BY MOUTH EVERY DAY 10/16/22   Delynn Flavin M, DO   folic acid (FOLVITE) 800 MCG tablet Take 400 mcg by mouth daily.    [provider]  Magnesium 500 MG TABS Take 1 tablet by mouth daily.    [provider]  memantine (NAMENDA) 10 MG tablet Take 1 tablet (10 mg total) by mouth 2 (two) times daily. 06/23/23   Levert Feinstein, MD  Multiple Vitamin (MULTIVITAMIN) capsule Take 1 capsule by mouth daily.      [provider]  Rivaroxaban (XARELTO) 15 MG TABS tablet TAKE 1 TABLET (15 MG TOTAL) BY MOUTH DAILY. 04/14/23   Delynn Flavin M, DO  Calcium Carbonate (CALCIUM 500 PO) Take 1 tablet by mouth daily.    10/30/11  [provider]      Allergies    Patient has no known allergies.    Review of Systems   Review of Systems  Constitutional:  Negative for fever.  Eyes:  Negative for visual disturbance.  Respiratory:  Negative for shortness of breath.   Cardiovascular:  Negative for chest pain.  Gastrointestinal:  Negative for abdominal pain.  Musculoskeletal:  Negative for neck pain.  Neurological:  Negative for headaches.    Physical Exam Updated Vital Signs BP 128/79 (BP Location: Right Arm)   Pulse (!) 104   Temp 98 F (36.7 C) (Oral)   Resp 16   Ht 5\' 3"  (1.6 m)   Wt 59 kg   SpO2 97%  BMI 23.03 kg/m  Physical Exam Vitals and nursing note reviewed.  Constitutional:      General: She is not in acute distress.    Appearance: Normal appearance. She is well-developed.  HENT:     Head: Normocephalic and atraumatic.  Eyes:     Conjunctiva/sclera: Conjunctivae normal.  Cardiovascular:     Rate and Rhythm: Tachycardia present. Rhythm irregular.     Heart sounds: No murmur heard. Pulmonary:     Effort: Pulmonary effort is normal. No respiratory distress.     Breath sounds: Normal breath sounds.  Abdominal:     Palpations: Abdomen is soft.     Tenderness: There is no abdominal tenderness. There is no guarding or rebound.  Musculoskeletal:        General: Tenderness present. No deformity.      Cervical back: Neck supple.     Comments: She is some tenderness around her left hip.  There is no gross shortening or rotation.  Knee and ankle nontender.  Left lower extremity and bilateral upper extremities full range of motion without any pain or limitations.  No cervical thoracic or lumbar tenderness  Skin:    General: Skin is warm and dry.     Capillary Refill: Capillary refill takes less than 2 seconds.  Neurological:     General: No focal deficit present.     Mental Status: She is alert.     Sensory: No sensory deficit.     Motor: No weakness.     ED Results / Procedures / Treatments   Labs (all labs ordered are listed, but only abnormal results are displayed) Labs Reviewed - No data to display  EKG None  Radiology No results found.  Procedures Procedures  {Document cardiac monitor, telemetry assessment procedure when appropriate:1}  Medications Ordered in ED Medications  fentaNYL (SUBLIMAZE) injection 50 mcg (has no administration in time range)    ED Course/ Medical Decision Making/ A&P Clinical Course as of 11/01/23 2016  Sat Nov 01, 2023  1923 I initially saw patient and her heart rate was around 100 but now she is going in rapid A-fib 140.  She is on diltiazem so however ordered her an IV dose. [MB]  2002 Discussed with orthopedics Dr. Odis Hollingshead.  He is recommending admission to [facility] on the medical service.  She needs to be in Buck's traction.  Keep n.p.o. in case they may be doing a procedure tomorrow on her. [MB]  2015 Discussed with [facility] hospitalist Dr. Antionette Char who will evaluate patient for admission.  I let him know that the patient will need admission to [facility] and that she is post to be n.p.o. after midnight.  Hold anticoagulation. [MB]    Clinical Course User Index [MB] Terrilee Files, MD   {   Click here for ABCD2, HEART and other calculatorsREFRESH Note before signing :1}                              Medical Decision Making Amount and/or Complexity  of Data Reviewed Labs: ordered. Radiology: ordered.  Risk Prescription drug management. Decision regarding hospitalization.   This patient complains of ***; this involves an extensive number of treatment Options and is a complaint that carries with it a high risk of complications and morbidity. The differential includes ***  I ordered, reviewed and interpreted labs, which included *** I ordered medication *** and reviewed PMP when indicated. I ordered imaging studies which included ***  and I independently    visualized and interpreted imaging which showed *** Additional history obtained from *** Previous records obtained and reviewed *** I consulted *** and discussed lab and imaging findings and discussed disposition.  Cardiac monitoring reviewed, *** Social determinants considered, *** Critical Interventions: ***  After the interventions stated above, I reevaluated the patient and found *** Admission and further testing considered, ***   {Document critical care time when appropriate:1} {Document review of labs and clinical decision tools ie heart score, Chads2Vasc2 etc:1}  {Document your independent review of radiology images, and any outside records:1} {Document your discussion with family members, caretakers, and with consultants:1} {Document social determinants of health affecting pt's care:1} {Document your decision making why or why not admission, treatments were needed:1} Final Clinical Impression(s) / ED Diagnoses Final diagnoses:  None    Rx / DC Orders ED Discharge Orders     None

## 2023-11-01 NOTE — ED Triage Notes (Signed)
 Pt bib EMS from [facility] for a witnessed fall. Pt fell onto left hip, did not hit head, no LOC. Pt states she has pain in left hip with movement.

## 2023-11-01 NOTE — H&P (Signed)
 History and Physical    [name] ZOX:[id number] DOB: 03-08-1931 DOA: 11/01/2023  PCP: [facility]   Patient coming from: ALF   Chief Complaint: Fall, left hip pain   HPI: [name] is a 89 y.o. female with medical history significant for dementia, CKD 3B, history of CVA, and chronic atrial fibrillation on Xarelto who presents with severe left hip and pelvic pain after a fall.  Patient reports that she was in her usual state of health when she got up and fell onto her left side today.  She denies hitting her head or losing consciousness but has been experiencing severe pain in the left hip and pelvis.  She is normally active at her facility where she participates in dancing and exercise classes.  She never experiences chest pain.  Her family at the bedside has never heard of her complaining of chest discomfort with activity.  They state that she did not take her Xarelto yet today but had taken it yesterday.  ED Course: Upon arrival to the ED, patient is found to be afebrile and saturating well on room air with elevated heart rate and stable blood pressure.  Labs are most notable for creatinine 1.18, WBC 16,600, and INR 1.4.  EKG demonstrates atrial fibrillation with rate 124.  Chest x-ray is negative for acute findings.  Head CT is negative for acute abnormality.  CT of the left hip and pelvis demonstrates comminuted fracture of the left acetabulum involving both columns with multiple mildly displaced fragments, nondisplaced fracture of the left inferior pubic ramus, and right superior pubic ramus fracture.  Orthopedic surgery (Dr. Odis Hollingshead) was consulted by the ED physician and the patient was treated with morphine, fentanyl, and IV diltiazem.  Review of Systems:  All other systems reviewed and apart from HPI, are negative.  Past Medical History:  Diagnosis Date   Arthritis    AV malformation of gastrointestinal tract    in cecum she denies  history of GIB   Breast mass, right    Cataract    CVA (cerebral vascular accident) (HCC) 2007   Dizziness    denies this as an ongoing problem   Hyperlipidemia    Microcytic anemia    Osteopenia    Persistent atrial fibrillation (HCC)    s/p PVI 04/2010  CHADS2vasc at least 5   RLS (restless legs syndrome)    Sinus bradycardia     Past Surgical History:  Procedure Laterality Date   afib ablation  04/2010   PVI with CTI ablation performed by [facility]   BREAST EXCISIONAL BIOPSY Right    benign cyst   breast mass resected     CARDIOVERSION     for afib   CATARACT EXTRACTION W/PHACO  11/11/2011   Procedure: CATARACT EXTRACTION PHACO AND INTRAOCULAR LENS PLACEMENT (IOC);  Surgeon: Gemma Payor, MD;  Location: [facility];  Service: Ophthalmology;  Laterality: Left;  CDE:12.62   CATARACT EXTRACTION W/PHACO  11/25/2011   Procedure: CATARACT EXTRACTION PHACO AND INTRAOCULAR LENS PLACEMENT (IOC);  Surgeon: Gemma Payor, MD;  Location: [facility];  Service: Ophthalmology;  Laterality: Right;  CDE 16.14   dental inplants      Social History:   reports that she quit smoking about 45 years ago. Her smoking use included cigarettes. She started smoking about 75 years ago. She has a 30 pack-year smoking history. She has never used smokeless tobacco. She reports that she does not drink alcohol and does not use drugs.  No Known  Allergies  Family History  Problem Relation Age of Onset   Stroke Mother 32   Hip fracture Mother    Lung cancer Father 60   Anesthesia problems Neg Hx    Hypotension Neg Hx    Malignant hyperthermia Neg Hx    Pseudochol deficiency Neg Hx    Breast cancer Neg Hx      Prior to Admission medications   Medication Sig Start Date End Date Taking? Authorizing Provider  acetaminophen (TYLENOL) 500 MG tablet Take 1-2 tablets (500-1,000 mg total) by mouth 3 (three) times daily as needed (arthritis pain). 05/07/16   Eckard, Tammy, RPH-CPP  Ascorbic Acid (VITAMIN C) 500 MG CAPS Take 500 mg by  mouth daily.    [provider]  atorvastatin (LIPITOR) 10 MG tablet TAKE 1/2 TABLET BY MOUTH ONCE A DAY OR AS INSTRUCTED BY PHYSICIAN 10/17/22   Delynn Flavin M, DO  Calcium Carb-Cholecalciferol 600-800 MG-UNIT TABS Take by mouth daily.    [provider]  Cholecalciferol (VITAMIN D3) 1000 UNITS CAPS Take 1 capsule by mouth daily.    [provider]  diltiazem (CARDIZEM CD) 180 MG 24 hr capsule TAKE 1 CAPSULE BY MOUTH EVERY DAY 10/16/22   Delynn Flavin M, DO  folic acid (FOLVITE) 800 MCG tablet Take 400 mcg by mouth daily.    [provider]  Magnesium 500 MG TABS Take 1 tablet by mouth daily.    [provider]  memantine (NAMENDA) 10 MG tablet Take 1 tablet (10 mg total) by mouth 2 (two) times daily. 06/23/23   Levert Feinstein, MD  Multiple Vitamin (MULTIVITAMIN) capsule Take 1 capsule by mouth daily.      [provider]  Rivaroxaban (XARELTO) 15 MG TABS tablet TAKE 1 TABLET (15 MG TOTAL) BY MOUTH DAILY. 04/14/23   Delynn Flavin M, DO  Calcium Carbonate (CALCIUM 500 PO) Take 1 tablet by mouth daily.    10/30/11  [provider]    Physical Exam: Vitals:   11/01/23 1636 11/01/23 1650 11/01/23 1651  BP: 128/79 128/79   Pulse: (!) 107 (!) 104   Resp: 16 16   Temp: 98.1 F (36.7 C) 98 F (36.7 C)   TempSrc: Oral Oral   SpO2: 97% 97%   Weight:   59 kg  Height:   5\' 3"  (1.6 m)    Constitutional: NAD, no pallor or diaphoresis   Eyes: PERTLA, lids and conjunctivae normal ENMT: Mucous membranes are moist. Posterior pharynx clear of any exudate or lesions.   Neck: supple, no masses  Respiratory: no wheezing, no crackles. No accessory muscle use.  Cardiovascular: S1 & S2 heard, regular rate and rhythm. No extremity edema.   Abdomen: No tenderness, soft. Bowel sounds active.  Musculoskeletal: no clubbing / cyanosis. Left hip tenderness, neurovascularly intact.   Skin: no significant rashes, lesions, ulcers. Warm, dry,  well-perfused. Neurologic: CN 2-12 grossly intact. Moving all extremities. Alert and oriented to person and place only.  Psychiatric: Restless. Cooperative.    Labs and Imaging on Admission: I have personally reviewed following labs and imaging studies  CBC: Recent Labs  Lab 11/01/23 1813  WBC 16.6*  NEUTROABS 13.8*  HGB 14.0  HCT 44.1  MCV 95.9  PLT 253   Basic Metabolic Panel: Recent Labs  Lab 11/01/23 1813  NA 136  K 4.0  CL 104  CO2 23  GLUCOSE 125*  BUN 26*  CREATININE 1.18*  CALCIUM 9.9   GFR: Estimated Creatinine Clearance: 26.2 mL/min (A) (by  C-G formula based on SCr of 1.18 mg/dL (H)). Liver Function Tests: No results for input(s): "AST", "ALT", "ALKPHOS", "BILITOT", "PROT", "ALBUMIN" in the last 168 hours. No results for input(s): "LIPASE", "AMYLASE" in the last 168 hours. No results for input(s): "AMMONIA" in the last 168 hours. Coagulation Profile: Recent Labs  Lab 11/01/23 1813  INR 1.4*   Cardiac Enzymes: No results for input(s): "CKTOTAL", "CKMB", "CKMBINDEX", "TROPONINI" in the last 168 hours. BNP (last 3 results) No results for input(s): "PROBNP" in the last 8760 hours. HbA1C: No results for input(s): "HGBA1C" in the last 72 hours. CBG: No results for input(s): "GLUCAP" in the last 168 hours. Lipid Profile: No results for input(s): "CHOL", "HDL", "LDLCALC", "TRIG", "CHOLHDL", "LDLDIRECT" in the last 72 hours. Thyroid Function Tests: No results for input(s): "TSH", "T4TOTAL", "FREET4", "T3FREE", "THYROIDAB" in the last 72 hours. Anemia Panel: No results for input(s): "VITAMINB12", "FOLATE", "FERRITIN", "TIBC", "IRON", "RETICCTPCT" in the last 72 hours. Urine analysis:    Component Value Date/Time   COLORURINE YELLOW 08/01/2012 1509   APPEARANCEUR Clear 06/05/2018 1008   LABSPEC 1.009 08/01/2012 1509   PHURINE 7.5 08/01/2012 1509   GLUCOSEU Negative 06/05/2018 1008   HGBUR NEGATIVE 08/01/2012 1509   BILIRUBINUR Negative 06/05/2018 1008    KETONESUR NEGATIVE 08/01/2012 1509   PROTEINUR 2+ (A) 06/05/2018 1008   PROTEINUR NEGATIVE 08/01/2012 1509   UROBILINOGEN negative 04/14/2015 0941   UROBILINOGEN 0.2 08/01/2012 1509   NITRITE Negative 06/05/2018 1008   NITRITE NEGATIVE 08/01/2012 1509   LEUKOCYTESUR Negative 06/05/2018 1008   Sepsis Labs: @LABRCNTIP (procalcitonin:4,lacticidven:4) )No results found for this or any previous visit (from the past 240 hours).   Radiological Exams on Admission: CT PELVIS WO CONTRAST Result Date: 11/01/2023 CLINICAL DATA:  Hip trauma, fracture seen on prior x-ray. Pain with movement. EXAM: CT PELVIS WITHOUT CONTRAST TECHNIQUE: Multidetector CT imaging of the pelvis was performed following the standard protocol without intravenous contrast. RADIATION DOSE REDUCTION: This exam was performed according to the departmental dose-optimization program which includes automated exposure control, adjustment of the mA and/or kV according to patient size and/or use of iterative reconstruction technique. COMPARISON:  Same day hip x-ray. FINDINGS: Urinary Tract:  No abnormality visualized. Bowel:  Unremarkable visualized pelvic bowel loops. Vascular/Lymphatic: No pathologically enlarged lymph nodes. Aortic atherosclerotic calcification. Reproductive:  No mass or other significant abnormality Other:  Small hematoma along the left pelvic sidewall. Musculoskeletal: Comminuted fracture of the left acetabulum with multiple mildly displaced fragments. Fracture lines extend superiorly into the iliac wing, medially through the medial acetabulum and through the posterior acetabulum. Anterior fracture lines extend into the base of the superior pubic ramus. Nondisplaced fracture of the left inferior pubic ramus near the pubic body (series 7/image 111) and buckle fracture of the left mid inferior pubic ramus. Buckle fracture of the right superior pubic ramus near the pubic body. Thickening of the left iliacus muscle compatible with  hematoma IMPRESSION: 1. Comminuted fracture of the left acetabulum involving both columns with multiple mildly displaced fragments. 2. Nondisplaced fracture of the left inferior pubic ramus near the pubic body and buckle fracture of the left mid inferior pubic ramus. 3. Buckle fracture of the right superior pubic ramus near the pubic body. 4. Small hematoma along the left pelvic sidewall. Intramuscular hematoma in the left iliacus. Electronically Signed   By: Minerva Fester M.D.   On: 11/01/2023 19:22   CT Head Wo Contrast Result Date: 11/01/2023 CLINICAL DATA:  Head trauma, minor (Age >= 65y).  Witnessed fall. EXAM:  CT HEAD WITHOUT CONTRAST TECHNIQUE: Contiguous axial images were obtained from the base of the skull through the vertex without intravenous contrast. RADIATION DOSE REDUCTION: This exam was performed according to the departmental dose-optimization program which includes automated exposure control, adjustment of the mA and/or kV according to patient size and/or use of iterative reconstruction technique. COMPARISON:  Head MRI 05/13/2013 FINDINGS: Brain: There is no evidence of an acute infarct, intracranial hemorrhage, mass, midline shift, or extra-axial fluid collection. There is mild cerebral atrophy. Cerebral white matter hypodensities are nonspecific but compatible with mild chronic small vessel ischemic disease. There is an unchanged small chronic infarct involving the left insula and underlying white matter. Vascular: Calcified atherosclerosis at the skull base. No hyperdense vessel. Skull: No acute fracture or suspicious lesion. Sinuses/Orbits: Visualized paranasal sinuses and mastoid air cells are clear. Bilateral cataract extraction. Other: Possible small left frontal scalp contusion. IMPRESSION: 1. No evidence of acute intracranial abnormality. 2. Mild chronic small vessel ischemic disease and cerebral atrophy. Electronically Signed   By: Sebastian Ache M.D.   On: 11/01/2023 19:06   DG Chest  1 View Result Date: 11/01/2023 CLINICAL DATA:  Fall.  Left hip injury. EXAM: CHEST  1 VIEW COMPARISON:  Chest radiographs 10/14/2018 and 08/08/2016. FINDINGS: 1733 hours. The heart appears mildly enlarged but stable. There is aortic atherosclerosis. There is a new nodular density projecting over the lateral aspect of the left 8th rib which could relate to a healed fracture or nipple shadow. A peripheral subpleural pulmonary nodule is considered less likely. The lungs are otherwise clear. There is no pleural effusion or pneumothorax. No acute fractures are identified in the chest. IMPRESSION: 1. No evidence of acute chest injury. 2. New nodular density projecting over the lateral aspect of the left 8th rib, possibly a healed fracture or nipple shadow. Recommend two view chest radiographs when the patient is able to exclude a pulmonary nodule. Electronically Signed   By: Carey Bullocks M.D.   On: 11/01/2023 17:55   DG Hip Unilat With Pelvis 2-3 Views Left Result Date: 11/01/2023 CLINICAL DATA:  [facility]ip [facility] at [facility] living center. EXAM: DG HIP (WITH OR WITHOUT PELVIS) 2-3V LEFT COMPARISON:  None Available. FINDINGS: AP pelvis with AP and cross-table lateral views of the left hip. The bones appear adequately mineralized. There is a comminuted fracture of the left acetabulum which extends into the left iliac bone. The left superior pubic ramus appears disrupted. There is resulting superior displacement of the left femoral head. No evidence of proximal femur fracture or dislocation. The sacroiliac joints appear intact. Iliofemoral atherosclerosis noted. IMPRESSION: Comminuted fracture of the left acetabulum extending into the left iliac bone with disruption of the left superior pubic ramus and superior displacement of the left femoral head. Recommend further evaluation with pelvic CT. Electronically Signed   By: Carey Bullocks M.D.   On: 11/01/2023 17:51    EKG: Independently reviewed. Atrial  fibrillation, rate 124.   Assessment/Plan   1. Left acetabular and pubic rami fractures  - Keep NPO after midnight, hold Xarelto, continue pain-control and supportive care    2. Chronic atrial fibrillation with RVR  - She was given IV diltiazem in ED but rate still sustaining 120s  - Hold Xarelto (last dose was 3/28), start diltiazem infusion for now, rate may improve as we get better pain-control    3. Hx of CVA  - Lipitor    4. CKD 3B  - Appears close to baseline  - Renally-dose medications  5. Dementia  - Namenda, delirium precautions   6. ?Left lung nodule  - Check 2v CXR when patient is able    DVT prophylaxis: SCDs, Xarelto pta  Code Status: Full for surgery  Level of Care: Level of care: Progressive Family Communication: Cousin at bedside  Disposition Plan:  Patient is from: ALF  Anticipated d/c is to: TBD Anticipated d/c date is: 11/04/23  Patient currently: Pending orthopedic surgery consultation, possible operative management   Consults called: Orthopedic surgery  Admission status: Inpatient    Briscoe Deutscher, MD [facility]  11/01/2023, 8:38 PM

## 2023-11-01 NOTE — Care Plan (Signed)
 Orthopaedic Surgery Plan of Care Note   -history and imaging reviewed with primary team (ER) -pt has left both column tab fx as well as sup & inf pub rami fx -admit to [facility] hospitalist team -please keep NPO and hold VTE ppx from MN -will review case with Ortho Trauma colleagues for possible ORIF tomorrow 3/30 -full consult note to follow   Netta Cedars, MD Orthopaedic Surgery [facility]

## 2023-11-02 ENCOUNTER — Inpatient Hospital Stay (HOSPITAL_COMMUNITY)

## 2023-11-02 ENCOUNTER — Encounter (HOSPITAL_COMMUNITY): Payer: Self-pay | Admitting: Family Medicine

## 2023-11-02 DIAGNOSIS — I482 Chronic atrial fibrillation, unspecified: Secondary | ICD-10-CM | POA: Diagnosis not present

## 2023-11-02 DIAGNOSIS — N1832 Chronic kidney disease, stage 3b: Secondary | ICD-10-CM | POA: Diagnosis not present

## 2023-11-02 DIAGNOSIS — S32402A Unspecified fracture of left acetabulum, initial encounter for closed fracture: Secondary | ICD-10-CM | POA: Diagnosis not present

## 2023-11-02 DIAGNOSIS — D649 Anemia, unspecified: Secondary | ICD-10-CM | POA: Diagnosis not present

## 2023-11-02 DIAGNOSIS — I4891 Unspecified atrial fibrillation: Secondary | ICD-10-CM

## 2023-11-02 DIAGNOSIS — F03B0 Unspecified dementia, moderate, without behavioral disturbance, psychotic disturbance, mood disturbance, and anxiety: Secondary | ICD-10-CM | POA: Diagnosis not present

## 2023-11-02 LAB — CBC
HCT: 38.4 % (ref 36.0–46.0)
Hemoglobin: 12.6 g/dL (ref 12.0–15.0)
MCH: 30.7 pg (ref 26.0–34.0)
MCHC: 32.8 g/dL (ref 30.0–36.0)
MCV: 93.7 fL (ref 80.0–100.0)
Platelets: 187 10*3/uL (ref 150–400)
RBC: 4.1 MIL/uL (ref 3.87–5.11)
RDW: 13.9 % (ref 11.5–15.5)
WBC: 12.4 10*3/uL — ABNORMAL HIGH (ref 4.0–10.5)
nRBC: 0 % (ref 0.0–0.2)

## 2023-11-02 LAB — ECHOCARDIOGRAM COMPLETE
Height: 63 in
S' Lateral: 2.81 cm
Weight: 2151.69 [oz_av]

## 2023-11-02 LAB — SURGICAL PCR SCREEN
MRSA, PCR: NEGATIVE
Staphylococcus aureus: NEGATIVE

## 2023-11-02 MED ORDER — SODIUM CHLORIDE 0.9 % IV BOLUS
1000.0000 mL | INTRAVENOUS | Status: AC
  Administered 2023-11-02: 1000 mL via INTRAVENOUS

## 2023-11-02 MED ORDER — MUPIROCIN 2 % EX OINT: 1.0000 | TOPICAL_OINTMENT | Freq: Two times a day (BID) | CUTANEOUS | Status: DC

## 2023-11-02 MED ORDER — INFLUENZA VAC A&B SURF ANT ADJ 0.5 ML IM SUSY
0.5000 mL | PREFILLED_SYRINGE | INTRAMUSCULAR | Status: AC
  Administered 2023-11-02: 0.5 mL via INTRAMUSCULAR
  Filled 2023-11-02: qty 0.5

## 2023-11-02 MED ORDER — SODIUM CHLORIDE 0.9 % IV SOLN: INTRAVENOUS | Status: DC

## 2023-11-02 MED ORDER — HEPARIN SODIUM (PORCINE) 5000 UNIT/ML IJ SOLN
5000.0000 [IU] | Freq: Three times a day (TID) | INTRAMUSCULAR | Status: DC
  Administered 2023-11-02 – 2023-11-04 (×6): 5000 [IU] via SUBCUTANEOUS
  Filled 2023-11-02 (×6): qty 1

## 2023-11-02 MED ORDER — METOPROLOL TARTRATE 25 MG PO TABS
25.0000 mg | ORAL_TABLET | Freq: Four times a day (QID) | ORAL | Status: DC
  Administered 2023-11-02 – 2023-11-05 (×13): 25 mg via ORAL
  Filled 2023-11-02 (×13): qty 1

## 2023-11-02 NOTE — Progress Notes (Signed)
 Orthopedic Tech Progress Note Patient Details:  [name] 08/24/1930 [id number]  Patient ID: [name], female   DOB: 1930-08-18, 89 y.o.   MRN: [id number] The patient doesn't meet the criteria for the ohf. Patient must be under 70 to get ohf. Trinna Post 11/02/2023, 1:20 AM

## 2023-11-02 NOTE — Progress Notes (Signed)
 [facility] PROGRESS NOTE   [name] HYQ:[id number] DOB: Dec 13, 1930 DOA: 11/01/2023  PCP: [facility]  Brief History: 89 y.o. female with medical history significant for dementia, CKD 3B, history of CVA, and chronic atrial fibrillation on Xarelto who presents with severe left hip and pelvic pain after a fall.  She was found to have comminuted fracture of the left acetabulum involving both columns with multiple mildly displaced fragments.  Nondisplaced fracture of the left inferior pubic ramus and right superior pubic ramus was also noted.  Orthopedics was consulted.  Patient was hospitalized for further management.  Consultants: Orthopedics  Procedures: None yet    Subjective/Interval History: Denies any pain in the left hip while she is laying still.  Denies any chest pain or shortness of breath.  No nausea or vomiting.    Assessment/Plan:  Left acetabular and pubic rami fractures Orthopedics has been consulted.  She might need surgical intervention.  Currently NPO.  Pain control.  Will need bowel regimen.  Chronic atrial fibrillation with RVR She required diltiazem infusion in the emergency department for elevated heart rates.  Subsequently she had a drop in her blood pressure and heart rate was better controlled as well.  So diltiazem infusion was discontinued. Patient currently on metoprolol 25 mg every 6 hours as per cardiology recommendations. Anticoagulated with Xarelto which is currently on hold in case she needs surgical intervention. Blood pressure seems to have improved.  Continue to monitor closely.  If heart rate becomes elevated again and blood pressure drops then digoxin will be the next step.  Hypotension Probably due to Cardizem infusion which has been discontinued.  Noted to be on IV fluids.  Heart rate has improved.  History of stroke Stable.  Continue statin.  Chronic kidney disease stage IIIb Stable.  Monitor urine  output.  History of dementia Noted to be on Namenda.  Abnormal chest x-ray Concern raised for left lung nodule.  Will need a 2 view chest x-ray but unable to do currently due to her fractures.  Can be pursued after discharge.  DVT Prophylaxis: SCDs Code Status: Full code Family Communication: No family at bedside Disposition Plan: Here from ALF.  Will likely need SNF.  Status is: Inpatient Remains inpatient appropriate because: Acetabular fracture requiring surgery.     Medications: Scheduled:  atorvastatin  10 mg Oral Daily   influenza vaccine adjuvanted  0.5 mL Intramuscular Tomorrow-1000   memantine  10 mg Oral BID   metoprolol tartrate  25 mg Oral Q6H   mouth rinse  15 mL Mouth Rinse 4 times per day   Continuous:  sodium chloride 100 mL/hr at 11/02/23 0348   XBM:WUXLKGMW (SUBLIMAZE) injection, methocarbamol, mouth rinse, oxyCODONE, polyethylene glycol, prochlorperazine  Antibiotics: Anti-infectives (From admission, onward)    None       Objective:  Vital Signs  Vitals:   11/02/23 0203 11/02/23 0421 11/02/23 0446 11/02/23 0729  BP: (!) 106/56 (!) 116/59 107/67 118/85  Pulse: 68 70 76   Resp:  20  19  Temp:  98.4 F (36.9 C)  98.2 F (36.8 C)  TempSrc:  Oral  Oral  SpO2: 97% 98%    Weight:      Height:        Intake/Output Summary (Last 24 hours) at 11/02/2023 0849 Last data filed at 11/02/2023 0458 Gross per 24 hour  Intake 1185.94 ml  Output --  Net 1185.94 ml   Filed Weights   11/01/23 1651 11/01/23 2249  Weight: 59 kg 61 kg    General appearance: Awake alert.  In no distress Resp: Clear to auscultation bilaterally.  Normal effort Cardio: S1-S2 is slightly irregular GI: Abdomen is soft.  Nontender nondistended.  Bowel sounds are present normal.  No masses organomegaly Extremities: Physical deconditioning.  Unable to move left leg due to traction and fracture. Neurologic: No focal neurological deficits.    Lab Results:  Data Reviewed: I  have personally reviewed following labs and reports of the imaging studies  CBC: Recent Labs  Lab 11/01/23 1813  WBC 16.6*  NEUTROABS 13.8*  HGB 14.0  HCT 44.1  MCV 95.9  PLT 253    Basic Metabolic Panel: Recent Labs  Lab 11/01/23 1813  NA 136  K 4.0  CL 104  CO2 23  GLUCOSE 125*  BUN 26*  CREATININE 1.18*  CALCIUM 9.9    GFR: Estimated Creatinine Clearance: 26.2 mL/min (A) (by C-G formula based on SCr of 1.18 mg/dL (H)).  Coagulation Profile: Recent Labs  Lab 11/01/23 1813  INR 1.4*     Recent Results (from the past 240 hours)  Surgical PCR screen     Status: None   Collection Time: 11/02/23  1:41 AM   Specimen: Nasal Mucosa; Nasal Swab  Result Value Ref Range Status   MRSA, PCR NEGATIVE NEGATIVE Final   Staphylococcus aureus NEGATIVE NEGATIVE Final    Comment: (NOTE) The Xpert SA Assay (FDA approved for NASAL specimens in patients 80 years of age and older), is one component of a comprehensive surveillance program. It is not intended to diagnose infection nor to guide or monitor treatment. Performed at [facility], [address], [place], [place] [address]       Radiology Studies: CT PELVIS WO CONTRAST Result Date: 11/01/2023 CLINICAL DATA:  Hip trauma, fracture seen on prior x-ray. Pain with movement. EXAM: CT PELVIS WITHOUT CONTRAST TECHNIQUE: Multidetector CT imaging of the pelvis was performed following the standard protocol without intravenous contrast. RADIATION DOSE REDUCTION: This exam was performed according to the departmental dose-optimization program which includes automated exposure control, adjustment of the mA and/or kV according to patient size and/or use of iterative reconstruction technique. COMPARISON:  Same day hip x-ray. FINDINGS: Urinary Tract:  No abnormality visualized. Bowel:  Unremarkable visualized pelvic bowel loops. Vascular/Lymphatic: No pathologically enlarged lymph nodes. Aortic atherosclerotic calcification.  Reproductive:  No mass or other significant abnormality Other:  Small hematoma along the left pelvic sidewall. Musculoskeletal: Comminuted fracture of the left acetabulum with multiple mildly displaced fragments. Fracture lines extend superiorly into the iliac wing, medially through the medial acetabulum and through the posterior acetabulum. Anterior fracture lines extend into the base of the superior pubic ramus. Nondisplaced fracture of the left inferior pubic ramus near the pubic body (series 7/image 111) and buckle fracture of the left mid inferior pubic ramus. Buckle fracture of the right superior pubic ramus near the pubic body. Thickening of the left iliacus muscle compatible with hematoma IMPRESSION: 1. Comminuted fracture of the left acetabulum involving both columns with multiple mildly displaced fragments. 2. Nondisplaced fracture of the left inferior pubic ramus near the pubic body and buckle fracture of the left mid inferior pubic ramus. 3. Buckle fracture of the right superior pubic ramus near the pubic body. 4. Small hematoma along the left pelvic sidewall. Intramuscular hematoma in the left iliacus. Electronically Signed   By: Minerva Fester M.D.   On: 11/01/2023 19:22   CT Head Wo Contrast Result Date: 11/01/2023 CLINICAL  DATA:  Head trauma, minor (Age >= 65y).  Witnessed fall. EXAM: CT HEAD WITHOUT CONTRAST TECHNIQUE: Contiguous axial images were obtained from the base of the skull through the vertex without intravenous contrast. RADIATION DOSE REDUCTION: This exam was performed according to the departmental dose-optimization program which includes automated exposure control, adjustment of the mA and/or kV according to patient size and/or use of iterative reconstruction technique. COMPARISON:  Head MRI 05/13/2013 FINDINGS: Brain: There is no evidence of an acute infarct, intracranial hemorrhage, mass, midline shift, or extra-axial fluid collection. There is mild cerebral atrophy. Cerebral white  matter hypodensities are nonspecific but compatible with mild chronic small vessel ischemic disease. There is an unchanged small chronic infarct involving the left insula and underlying white matter. Vascular: Calcified atherosclerosis at the skull base. No hyperdense vessel. Skull: No acute fracture or suspicious lesion. Sinuses/Orbits: Visualized paranasal sinuses and mastoid air cells are clear. Bilateral cataract extraction. Other: Possible small left frontal scalp contusion. IMPRESSION: 1. No evidence of acute intracranial abnormality. 2. Mild chronic small vessel ischemic disease and cerebral atrophy. Electronically Signed   By: Sebastian Ache M.D.   On: 11/01/2023 19:06   DG Chest 1 View Result Date: 11/01/2023 CLINICAL DATA:  Fall.  Left hip injury. EXAM: CHEST  1 VIEW COMPARISON:  Chest radiographs 10/14/2018 and 08/08/2016. FINDINGS: 1733 hours. The heart appears mildly enlarged but stable. There is aortic atherosclerosis. There is a new nodular density projecting over the lateral aspect of the left 8th rib which could relate to a healed fracture or nipple shadow. A peripheral subpleural pulmonary nodule is considered less likely. The lungs are otherwise clear. There is no pleural effusion or pneumothorax. No acute fractures are identified in the chest. IMPRESSION: 1. No evidence of acute chest injury. 2. New nodular density projecting over the lateral aspect of the left 8th rib, possibly a healed fracture or nipple shadow. Recommend two view chest radiographs when the patient is able to exclude a pulmonary nodule. Electronically Signed   By: Carey Bullocks M.D.   On: 11/01/2023 17:55   DG Hip Unilat With Pelvis 2-3 Views Left Result Date: 11/01/2023 CLINICAL DATA:  [facility] EXAM: DG HIP (WITH OR WITHOUT PELVIS) 2-3V LEFT COMPARISON:  None Available. FINDINGS: AP pelvis with AP and cross-table lateral views of the left hip. The bones appear adequately  mineralized. There is a comminuted fracture of the left acetabulum which extends into the left iliac bone. The left superior pubic ramus appears disrupted. There is resulting superior displacement of the left femoral head. No evidence of proximal femur fracture or dislocation. The sacroiliac joints appear intact. Iliofemoral atherosclerosis noted. IMPRESSION: Comminuted fracture of the left acetabulum extending into the left iliac bone with disruption of the left superior pubic ramus and superior displacement of the left femoral head. Recommend further evaluation with pelvic CT. Electronically Signed   By: Carey Bullocks M.D.   On: 11/01/2023 17:51       LOS: 1 day   [employer]  [facility] Pager on [email or web address]  11/02/2023, 8:49 AM

## 2023-11-02 NOTE — Consult Note (Addendum)
 As the orthopaedic trauma surgeon on call, I have seen and examined the patient at the direction of Dr. Cherl Corner. I have personally reviewed and discussed in detail with [name], , PA-C, the patient's presentation, progress, and confirmed the examination findings; I have also reviewed and interpreted the x-rays and laboratory studies; and I formulated the plan for treatment which is outlined above, in addition to communicating with the primary service.Favorable secondary congruity of the hip.  Lisa Lia, MD [facility], [facility] [phone]                    Orthopaedic Trauma Service (OTS) Consult   Patient ID: [name] MRN: [id number] DOB/AGE: 1930-09-20 89 y.o.   Reason for Consult: Left Acetabulum fracture Referring Physician: Racheal Buddle, MD (EDP)   HPI: [name] is an 89 y.o. female who resides at a memory care unit who sustained a ground-level fall yesterday.  By report she does not use any assistive devices.  Activity level has diminished a little since she is transferred to the memory care unit however she does regularly partake in activities at the facility.  And again ambulates without an assistive device.  Cousin's are at bedside who are medical decision makers for her.  They are her nearest relatives.  Currently patient denies any pain.  She is in Buck's traction.  Denies any numbness or tingling in her lower extremities.  Denies any additional injuries elsewhere.  Knows that she is in the hospital but does not recall why she is in the hospital.  Patient is on Xarelto  daily  Last bone density scan that I see is from 2022.  Lumbar spine and right femoral neck were scanned.  Right femoral neck with T-score of -1.8 which is consistent with osteopenia.  No pharmacologic agents for her bone health other than multivitamin, vitamin C and magnesium   Past Medical History:  Diagnosis Date   Arthritis    AV malformation of  gastrointestinal tract    in cecum she denies history of GIB   Breast mass, right    Cataract    CVA (cerebral vascular accident) (HCC) 2007   Dementia (HCC)    Dizziness    denies this as an ongoing problem   Hyperlipidemia    Microcytic anemia    Osteopenia    Persistent atrial fibrillation (HCC)    s/p PVI 04/2010  CHADS2vasc at least 5   RLS (restless legs syndrome)    Sinus bradycardia     Past Surgical History:  Procedure Laterality Date   afib ablation  04/2010   PVI with CTI ablation performed by JA   BREAST EXCISIONAL BIOPSY Right    benign cyst   breast mass resected     CARDIOVERSION     for afib   CATARACT EXTRACTION W/PHACO  11/11/2011   Procedure: CATARACT EXTRACTION PHACO AND INTRAOCULAR LENS PLACEMENT (IOC);  Surgeon: Anner Kill, MD;  Location: [facility];  Service: Ophthalmology;  Laterality: Left;  CDE:12.62   CATARACT EXTRACTION W/PHACO  11/25/2011   Procedure: CATARACT EXTRACTION PHACO AND INTRAOCULAR LENS PLACEMENT (IOC);  Surgeon: Anner Kill, MD;  Location: [facility];  Service: Ophthalmology;  Laterality: Right;  CDE 16.14   dental inplants      Family History  Problem Relation Age of Onset   Stroke Mother 67   Hip fracture Mother    Lung cancer Father 44   Anesthesia problems Neg Hx    Hypotension Neg Hx  Malignant hyperthermia Neg Hx    Pseudochol deficiency Neg Hx    Breast cancer Neg Hx     Social History:  reports that she quit smoking about 45 years ago. Her smoking use included cigarettes. She started smoking about 75 years ago. She has a 30 pack-year smoking history. She has never used smokeless tobacco. She reports that she does not drink alcohol and does not use drugs.  Allergies: No Known Allergies  Medications: I have reviewed the patient's current medications. Current Meds  Medication Sig   Ascorbic Acid (VITAMIN C) 500 MG CAPS Take 500 mg by mouth daily.   atorvastatin  (LIPITOR) 10 MG tablet TAKE 1/2 TABLET BY MOUTH ONCE A DAY OR AS  INSTRUCTED BY PHYSICIAN (Patient taking differently: Take 10 mg by mouth daily. TAKE 1/2 TABLET BY MOUTH ONCE A DAY OR AS INSTRUCTED BY PHYSICIAN)   diltiazem  (CARDIZEM  CD) 180 MG 24 hr capsule TAKE 1 CAPSULE BY MOUTH EVERY DAY   folic acid (FOLVITE) 800 MCG tablet Take 400 mcg by mouth daily.   Magnesium Oxide -Mg Supplement 500 MG TABS Take 1 tablet by mouth daily.   memantine  (NAMENDA ) 10 MG tablet Take 1 tablet (10 mg total) by mouth 2 (two) times daily.   Multiple Vitamin (MULTIVITAMIN) capsule Take 1 capsule by mouth daily.     Rivaroxaban  (XARELTO ) 15 MG TABS tablet TAKE 1 TABLET (15 MG TOTAL) BY MOUTH DAILY.     Results for orders placed or performed during the hospital encounter of 11/01/23 (from the past 48 hours)  Basic metabolic panel     Status: Abnormal   Collection Time: 11/01/23  6:13 PM  Result Value Ref Range   Sodium 136 135 - 145 mmol/L   Potassium 4.0 3.5 - 5.1 mmol/L   Chloride 104 98 - 111 mmol/L   CO2 23 22 - 32 mmol/L   Glucose, Bld 125 (H) 70 - 99 mg/dL    Comment: Glucose reference range applies only to samples taken after fasting for at least 8 hours.   BUN 26 (H) 8 - 23 mg/dL   Creatinine, Ser 1.61 (H) 0.44 - 1.00 mg/dL   Calcium  9.9 8.9 - 10.3 mg/dL   GFR, Estimated 44 (L) >60 mL/min    Comment: (NOTE) Calculated using the CKD-EPI Creatinine Equation (2021)    Anion gap 9 5 - 15    Comment: Performed at [facility], [address] [place], [place] [address]  CBC with Differential     Status: Abnormal   Collection Time: 11/01/23  6:13 PM  Result Value Ref Range   WBC 16.6 (H) 4.0 - 10.5 K/uL   RBC 4.60 3.87 - 5.11 MIL/uL   Hemoglobin 14.0 12.0 - 15.0 g/dL   HCT 54.0 98.1 - 19.1 %   MCV 95.9 80.0 - 100.0 fL   MCH 30.4 26.0 - 34.0 pg   MCHC 31.7 30.0 - 36.0 g/dL   RDW 47.8 29.5 - 62.1 %   Platelets 253 150 - 400 K/uL   nRBC 0.0 0.0 - 0.2 %   Neutrophils Relative % 83 %   Neutro Abs 13.8 (H) 1.7 - 7.7 K/uL   Lymphocytes  Relative 8 %   Lymphs Abs 1.3 0.7 - 4.0 K/uL   Monocytes Relative 8 %   Monocytes Absolute 1.3 (H) 0.1 - 1.0 K/uL   Eosinophils Relative 0 %   Eosinophils Absolute 0.0 0.0 - 0.5 K/uL   Basophils Relative 0 %   Basophils  Absolute 0.0 0.0 - 0.1 K/uL   Immature Granulocytes 1 %   Abs Immature Granulocytes 0.16 (H) 0.00 - 0.07 K/uL    Comment: Performed at [facility], [address], [place], [place] [address]  Protime-INR     Status: Abnormal   Collection Time: 11/01/23  6:13 PM  Result Value Ref Range   Prothrombin Time 17.6 (H) 11.4 - 15.2 seconds   INR 1.4 (H) 0.8 - 1.2    Comment: (NOTE) INR goal varies based on device and disease states. Performed at [facility], [address] [place], [place] [address]   Type and screen [facility]     Status: None   Collection Time: 11/01/23  6:13 PM  Result Value Ref Range   ABO/RH(D) AB POS    Antibody Screen NEG    Sample Expiration      11/04/2023,2359 Performed at [facility], [address] [place], [place] [address]   Surgical PCR screen     Status: None   Collection Time: 11/02/23  1:41 AM   Specimen: Nasal Mucosa; Nasal Swab  Result Value Ref Range   MRSA, PCR NEGATIVE NEGATIVE   Staphylococcus aureus NEGATIVE NEGATIVE    Comment: (NOTE) The Xpert SA Assay (FDA approved for NASAL specimens in patients 58 years of age and older), is one component of a comprehensive surveillance program. It is not intended to diagnose infection nor to guide or monitor treatment. Performed at [facility], [address], [place], [place] [address]   CBC     Status: Abnormal   Collection Time: 11/02/23 10:15 AM  Result Value Ref Range   WBC 12.4 (H) 4.0 - 10.5 K/uL   RBC 4.10 3.87 - 5.11 MIL/uL   Hemoglobin 12.6 12.0 - 15.0 g/dL   HCT 30.8 65.7 - 84.6 %   MCV 93.7 80.0 - 100.0 fL   MCH 30.7 26.0 - 34.0 pg   MCHC 32.8 30.0 - 36.0 g/dL   RDW 96.2 95.2  - 84.1 %   Platelets 187 150 - 400 K/uL   nRBC 0.0 0.0 - 0.2 %    Comment: Performed at [facility], [address] [place], [place] [address]    CT PELVIS WO CONTRAST Result Date: 11/01/2023 CLINICAL DATA:  Hip trauma, fracture seen on prior x-ray. Pain with movement. EXAM: CT PELVIS WITHOUT CONTRAST TECHNIQUE: Multidetector CT imaging of the pelvis was performed following the standard protocol without intravenous contrast. RADIATION DOSE REDUCTION: This exam was performed according to the departmental dose-optimization program which includes automated exposure control, adjustment of the mA and/or kV according to patient size and/or use of iterative reconstruction technique. COMPARISON:  Same day hip x-ray. FINDINGS: Urinary Tract:  No abnormality visualized. Bowel:  Unremarkable visualized pelvic bowel loops. Vascular/Lymphatic: No pathologically enlarged lymph nodes. Aortic atherosclerotic calcification. Reproductive:  No mass or other significant abnormality Other:  Small hematoma along the left pelvic sidewall. Musculoskeletal: Comminuted fracture of the left acetabulum with multiple mildly displaced fragments. Fracture lines extend superiorly into the iliac wing, medially through the medial acetabulum and through the posterior acetabulum. Anterior fracture lines extend into the base of the superior pubic ramus. Nondisplaced fracture of the left inferior pubic ramus near the pubic body (series 7/image 111) and buckle fracture of the left mid inferior pubic ramus. Buckle fracture of the right superior pubic ramus near the pubic body. Thickening of the left iliacus muscle compatible with hematoma  IMPRESSION: 1. Comminuted fracture of the left acetabulum involving both columns with multiple mildly displaced fragments. 2. Nondisplaced fracture of the left inferior pubic ramus near the pubic body and buckle fracture of the left mid inferior pubic ramus. 3. Buckle fracture of the right superior pubic  ramus near the pubic body. 4. Small hematoma along the left pelvic sidewall. Intramuscular hematoma in the left iliacus. Electronically Signed   By: Rozell Cornet M.D.   On: 11/01/2023 19:22   CT Head Wo Contrast Result Date: 11/01/2023 CLINICAL DATA:  Head trauma, minor (Age >= 65y).  Witnessed fall. EXAM: CT HEAD WITHOUT CONTRAST TECHNIQUE: Contiguous axial images were obtained from the base of the skull through the vertex without intravenous contrast. RADIATION DOSE REDUCTION: This exam was performed according to the departmental dose-optimization program which includes automated exposure control, adjustment of the mA and/or kV according to patient size and/or use of iterative reconstruction technique. COMPARISON:  Head MRI 05/13/2013 FINDINGS: Brain: There is no evidence of an acute infarct, intracranial hemorrhage, mass, midline shift, or extra-axial fluid collection. There is mild cerebral atrophy. Cerebral white matter hypodensities are nonspecific but compatible with mild chronic small vessel ischemic disease. There is an unchanged small chronic infarct involving the left insula and underlying white matter. Vascular: Calcified atherosclerosis at the skull base. No hyperdense vessel. Skull: No acute fracture or suspicious lesion. Sinuses/Orbits: Visualized paranasal sinuses and mastoid air cells are clear. Bilateral cataract extraction. Other: Possible small left frontal scalp contusion. IMPRESSION: 1. No evidence of acute intracranial abnormality. 2. Mild chronic small vessel ischemic disease and cerebral atrophy. Electronically Signed   By: Aundra Lee M.D.   On: 11/01/2023 19:06   DG Chest 1 View Result Date: 11/01/2023 CLINICAL DATA:  Fall.  Left hip injury. EXAM: CHEST  1 VIEW COMPARISON:  Chest radiographs 10/14/2018 and 08/08/2016. FINDINGS: 1733 hours. The heart appears mildly enlarged but stable. There is aortic atherosclerosis. There is a new nodular density projecting over the lateral  aspect of the left 8th rib which could relate to a healed fracture or nipple shadow. A peripheral subpleural pulmonary nodule is considered less likely. The lungs are otherwise clear. There is no pleural effusion or pneumothorax. No acute fractures are identified in the chest. IMPRESSION: 1. No evidence of acute chest injury. 2. New nodular density projecting over the lateral aspect of the left 8th rib, possibly a healed fracture or nipple shadow. Recommend two view chest radiographs when the patient is able to exclude a pulmonary nodule. Electronically Signed   By: Elmon Hagedorn M.D.   On: 11/01/2023 17:55   DG Hip Unilat With Pelvis 2-3 Views Left Result Date: 11/01/2023 CLINICAL DATA:  Hip pain after falling at senior living center. EXAM: DG HIP (WITH OR WITHOUT PELVIS) 2-3V LEFT COMPARISON:  None Available. FINDINGS: AP pelvis with AP and cross-table lateral views of the left hip. The bones appear adequately mineralized. There is a comminuted fracture of the left acetabulum which extends into the left iliac bone. The left superior pubic ramus appears disrupted. There is resulting superior displacement of the left femoral head. No evidence of proximal femur fracture or dislocation. The sacroiliac joints appear intact. Iliofemoral atherosclerosis noted. IMPRESSION: Comminuted fracture of the left acetabulum extending into the left iliac bone with disruption of the left superior pubic ramus and superior displacement of the left femoral head. Recommend further evaluation with pelvic CT. Electronically Signed   By: Elmon Hagedorn M.D.   On: 11/01/2023 17:51  Intake/Output      03/29 0701 03/30 0700 03/30 0701 03/31 0700   I.V. (mL/kg) 385.9 (6.3)    IV Piggyback 800    Total Intake(mL/kg) 1185.9 (19.4)    Net +1185.9            Review of Systems  Constitutional:  Negative for chills and fever.  Respiratory:  Negative for shortness of breath.   Cardiovascular:  Negative for chest pain and  palpitations.  Gastrointestinal:  Negative for nausea and vomiting.  Neurological:  Negative for tingling and sensory change.   Blood pressure 117/66, pulse 94, temperature 98.2 F (36.8 C), temperature source Oral, resp. rate 19, height 5\' 3"  (1.6 m), weight 61 kg, SpO2 98%. Physical Exam  Gen: awake, pleasant, resting comfortably in bed Lungs: Unlabored Cardiac: Regular Abdomen: Soft, nontender Musculoskeletal  Left lower extremity   10 pounds of Buck's traction is in place   No traumatic wounds or lesions noted to her left hip   No significant ecchymosis appreciated   Buck's traction was removed   No pain with axial loading or logrolling of her hip   Patient attempted to flex her hip and did so without much discomfort   Extremity is warm   + DP pulse   No DCT   DPN, SPN, TN sensory functions are grossly intact   EHL, FHL, lesser toe motor functions are grossly intact   Ankle flexion, extension, inversion eversion are intact    Bilateral upper extremities and right lower extremity   No acute findings   Motor and sensory functions are grossly intact   No blocks to motion        Assessment/Plan:  89 year old female ground-level fall with left anterior, posterior hemitransverse acetabular fracture  -Ground-level fall  -Fragility fracture left acetabulum--> anterior column posterior hemitransverse  Discussed treatment options with family and patient  Reviewed surgical and nonoperative options  Given her mobility and current comfort level we will trial nonoperative treatment.  We will allow her to work with therapy  She will be touchdown weightbearing for the next 6 to 8 weeks and essentially be bed to chair given her mental status  No formal motion restrictions  If at any point that she experiences severe unrelenting pain we can then revisit surgical intervention   She is at increased risk for complications given her age, chronic anticoagulation, history of  cerebrovascular and cardiac disease.   Family are in agreement with nonoperative management for now   Will get baseline Judet films and also have 3D recons of her pelvis formatted.  Original CT was done at [facility].  Had to communicate with the [facility] long CT department to arrange for this   - ABL anemia/Hemodynamics  Monitor  - Medical issues   Per primary  - DVT/PE prophylaxis:  Would hold DOAC for now okay to use Lovenox or subcu heparin   - Metabolic Bone Disease:  Osteopenia  Vitamin D   - Activity:  Therapy evals, touchdown weightbearing left leg  - Impediments to fracture healing:  Osteopenia  Ability to maintain weightbearing restrictions  - Dispo:  Therapy evals  Continue with nonoperative management left acetabulum for now    Geroldine Kotyk, PA-C [phone] (C) 11/02/2023, 11:12 AM  [facility] [address] [place] [address] [phone] Cathlean Co (F)    After 5pm and on the weekends please log on to Amion, go to orthopaedics and the look under the [facility] Call for the provider(s) on  call. You can also call our office at [phone] and then follow the prompts to be connected to the call team.

## 2023-11-02 NOTE — Progress Notes (Signed)
 Echocardiogram 2D Echocardiogram has been performed.  [name] 11/02/2023, 2:05 PM

## 2023-11-02 NOTE — Progress Notes (Addendum)
 History of paroxysmal atrial fibrillation A-fib RVR-resolved RN reported that patient blood pressure has been dropped to 87/52.  Patient is being on Cardizem drip in the setting of A-fib RVR.  History of paroxysmal atrial fibrillation at home patient is on Cardizem 180 mg daily.  Currently heart rate in between 70-80 however still atrial fibrillation.  As patient heart rate has been improved to below 100 holding the Cardizem drip now.  Will give 1 L of NS bolus and will continue maintenance fluid 125 cc/h in the setting of hypotension.   Update, spoke with on-call cardiology Dr. Brayton Layman  recommended discontinue the Cardizem drip given patient does not have any recent echocardiogram after 2010 and we do not know the underlying ejection fraction as well as patient is hypotensive with Cardizem drip. -Cardiology recommended to start metoprolol 25 mg every 6 and afterward can transition to Toprol-XL, get an echocardiogram.  However if patient redevelops A-fib RVR recommended to give digoxin load 0.5 mg followed by in 6-hour 0.25 mg every 6 hour 2 doses..  Recommended to avoid amiodarone given Xarelto being on hold.   Tereasa Coop, MD [facility] 11/02/2023, 2:09 AM

## 2023-11-02 NOTE — Progress Notes (Signed)
 Transition of Care [facility]) - CAGE-AID Screening   Patient Details  Name: [name] MRN: [id number] Date of Birth: [birth year]   [name], RN Trauma Response Nurse Phone Number: [phone] 11/02/2023, 12:50 PM   CAGE-AID Screening:    Have You Ever Felt You Ought to Cut Down on Your Drinking or Drug Use?: No Have People Annoyed You By Critizing Your Drinking Or Drug Use?: No Have You Felt Bad Or Guilty About Your Drinking Or Drug Use?: No Have You Ever Had a Drink or Used Drugs First Thing In The Morning to Steady Your Nerves or to Get Rid of a Hangover?: No CAGE-AID Score: 0  Substance Abuse Education Offered: (S) No (No services needed)

## 2023-11-02 NOTE — Progress Notes (Signed)
 Orthopedic Tech Progress Note Patient Details:  [name] 1931-05-20 [id number]  Musculoskeletal Traction Type of Traction: Bucks Skin Traction Traction Location: lle Traction Weight: 10 lbs   Post Interventions Patient Tolerated: Well Instructions Provided: Care of device, Adjustment of device  Trinna Post 11/02/2023, 1:20 AM

## 2023-11-02 NOTE — Plan of Care (Signed)
  Problem: Clinical Measurements: Goal: Will remain free from infection Outcome: Progressing Goal: Respiratory complications will improve Outcome: Progressing Goal: Cardiovascular complication will be avoided Outcome: Progressing   Problem: Coping: Goal: Level of anxiety will decrease Outcome: Progressing   Problem: Pain Managment: Goal: General experience of comfort will improve and/or be controlled Outcome: Progressing   Problem: Safety: Goal: Ability to remain free from injury will improve Outcome: Progressing

## 2023-11-03 ENCOUNTER — Inpatient Hospital Stay (HOSPITAL_COMMUNITY)

## 2023-11-03 DIAGNOSIS — F39 Unspecified mood [affective] disorder: Secondary | ICD-10-CM | POA: Diagnosis not present

## 2023-11-03 DIAGNOSIS — N1832 Chronic kidney disease, stage 3b: Secondary | ICD-10-CM | POA: Diagnosis not present

## 2023-11-03 DIAGNOSIS — I482 Chronic atrial fibrillation, unspecified: Secondary | ICD-10-CM | POA: Diagnosis not present

## 2023-11-03 DIAGNOSIS — S32512A Fracture of superior rim of left pubis, initial encounter for closed fracture: Secondary | ICD-10-CM | POA: Diagnosis not present

## 2023-11-03 DIAGNOSIS — F03B0 Unspecified dementia, moderate, without behavioral disturbance, psychotic disturbance, mood disturbance, and anxiety: Secondary | ICD-10-CM | POA: Diagnosis not present

## 2023-11-03 DIAGNOSIS — S32402A Unspecified fracture of left acetabulum, initial encounter for closed fracture: Secondary | ICD-10-CM | POA: Diagnosis not present

## 2023-11-03 DIAGNOSIS — D649 Anemia, unspecified: Secondary | ICD-10-CM

## 2023-11-03 DIAGNOSIS — S32511A Fracture of superior rim of right pubis, initial encounter for closed fracture: Secondary | ICD-10-CM | POA: Diagnosis not present

## 2023-11-03 LAB — PHOSPHORUS: Phosphorus: 3.1 mg/dL (ref 2.5–4.6)

## 2023-11-03 LAB — BASIC METABOLIC PANEL WITH GFR
Anion gap: 10 (ref 5–15)
BUN: 21 mg/dL (ref 8–23)
CO2: 18 mmol/L — ABNORMAL LOW (ref 22–32)
Calcium: 8.4 mg/dL — ABNORMAL LOW (ref 8.9–10.3)
Chloride: 107 mmol/L (ref 98–111)
Creatinine, Ser: 1.12 mg/dL — ABNORMAL HIGH (ref 0.44–1.00)
GFR, Estimated: 47 mL/min — ABNORMAL LOW (ref >60)
Glucose, Bld: 101 mg/dL — ABNORMAL HIGH (ref 70–99)
Potassium: 4.6 mmol/L (ref 3.5–5.1)
Sodium: 135 mmol/L (ref 135–145)

## 2023-11-03 LAB — CBC
HCT: 32.7 % — ABNORMAL LOW (ref 36.0–46.0)
Hemoglobin: 10.8 g/dL — ABNORMAL LOW (ref 12.0–15.0)
MCH: 31 pg (ref 26.0–34.0)
MCHC: 33 g/dL (ref 30.0–36.0)
MCV: 94 fL (ref 80.0–100.0)
Platelets: 162 10*3/uL (ref 150–400)
RBC: 3.48 MIL/uL — ABNORMAL LOW (ref 3.87–5.11)
RDW: 14.1 % (ref 11.5–15.5)
WBC: 15.1 10*3/uL — ABNORMAL HIGH (ref 4.0–10.5)
nRBC: 0 % (ref 0.0–0.2)

## 2023-11-03 LAB — MAGNESIUM: Magnesium: 2.1 mg/dL (ref 1.7–2.4)

## 2023-11-03 MED ORDER — ENSURE ENLIVE PO LIQD
237.0000 mL | Freq: Two times a day (BID) | ORAL | Status: DC
  Administered 2023-11-03 – 2023-11-05 (×6): 237 mL via ORAL

## 2023-11-03 MED ORDER — ADULT MULTIVITAMIN W/MINERALS CH
1.0000 | ORAL_TABLET | Freq: Every day | ORAL | Status: DC
  Administered 2023-11-03 – 2023-11-05 (×3): 1 via ORAL
  Filled 2023-11-03 (×3): qty 1

## 2023-11-03 NOTE — TOC Initial Note (Signed)
 Transition of Care [facility]) - Initial/Assessment Note    Patient Details  Name: [name] MRN: [id number] Date of Birth: 1931/06/06  Transition of [facility]) CM/SW Contact:    Delilah Shan, LCSWA Phone Number: 11/03/2023, 1:21 PM  Clinical Narrative:                   CSW received consult for possible SNF placement at time of discharge. CSW spoke with patient and family friend Lupita Leash at bedside. Patient reports she comes from [facility].Patient expressed understanding of PT recommendation and is agreeable to SNF placement at time of discharge. Patient informed CSW her HCPOA is her cousin-n-law Purnell Shoemaker. Patient gave CSW permission to follow up with Purnell Shoemaker regarding her dc plan. CSW spoke with patients Clarita Leber who is in agreement with SNF placement for patient. Purnell Shoemaker gave CSW permission to fax out initial referral for SNF placement for patient.CSW discussed insurance authorization process and will provide Medicare SNF ratings list with accepted SNF bed offers when available. No further questions reported at this time. CSW to continue to follow and assist with discharge planning needs.   Expected Discharge Plan: Skilled Nursing Facility Barriers to Discharge: Continued Medical Work up   Patient Goals and CMS Choice Patient states their goals for this hospitalization and ongoing recovery are:: SNF   Choice offered to / list presented to : Patient (cousin-n-law Purnell Shoemaker)      Expected Discharge Plan and Services In-house Referral: Clinical Social Work                                            Prior Living Arrangements/Services   Lives with:: Facility Resident Patient language and need for interpreter reviewed:: Yes Do you feel safe going back to the place where you live?: No   SNF  Need for Family Participation in Patient Care: Yes (Comment) Care giver support system in place?: Yes (comment)   Criminal Activity/Legal Involvement Pertinent to Current  Situation/Hospitalization: No - Comment as needed  Activities of Daily Living   ADL Screening (condition at time of admission) Independently performs ADLs?: Yes (appropriate for developmental age) Is the patient deaf or have difficulty hearing?: Yes Does the patient have difficulty seeing, even when wearing glasses/contacts?: No Does the patient have difficulty concentrating, remembering, or making decisions?: No  Permission Sought/Granted Permission sought to share information with : Case Manager, Family Supports, Oceanographer granted to share information with : Yes, Verbal Permission Granted  Share Information with NAME: Purnell Shoemaker  Permission granted to share info w AGENCY: SNF  Permission granted to share info w Relationship: HCPOA cousin n law  Permission granted to share info w Contact Information: Purnell Shoemaker [phone]  Emotional Assessment Appearance:: Appears stated age Attitude/Demeanor/Rapport: Gracious Affect (typically observed): Calm Orientation: : Oriented to Self, Oriented to Situation Alcohol / Substance Use: Not Applicable Psych Involvement: No (comment)  Admission diagnosis:  Closed left acetabular fracture (HCC) [S32.402A] Patient Active Problem List   Diagnosis Date Noted   Closed left acetabular fracture (HCC) 11/01/2023   History of stroke 11/01/2023   Moderate dementia without behavioral disturbance, psychotic disturbance, mood disturbance, or anxiety (HCC) 03/18/2023   Educated about COVID-19 virus infection 08/09/2019   CKD (chronic kidney disease) stage 3, GFR 30-59 ml/min (HCC) 03/09/2019   Chronic anticoagulation 03/09/2019   Acquired trigger finger of right ring finger 10/26/2018  AV malformation of gastrointestinal tract    Primary osteoarthritis involving multiple joints 02/07/2015   High risk medication use 06/21/2013   Dizziness    Restless leg syndrome 03/01/2013   Chronic atrial fibrillation with RVR (HCC) 11/03/2008    Hyperlipidemia 10/24/2008   ANEMIA, IRON DEFICIENCY, MICROCYTIC 10/24/2008   SINUS BRADYCARDIA 10/24/2008   CVA 10/24/2008   Low bone mass 10/24/2008   PCP:  [facility]:   [facility] [phone] - [place], [place] - [address] [place] [address] Phone: [phone] Fax: [phone]  [place], [place] - [address] [place] [address] Phone: [phone] Fax: [phone]     Social Drivers of Health (SDOH) Social History: SDOH Screenings   Food Insecurity: No Food Insecurity (11/02/2023)  Housing: Low Risk  (11/02/2023)  Transportation Needs: No Transportation Needs (11/02/2023)  Utilities: Not At Risk (11/02/2023)  Alcohol Screen: Low Risk  (06/21/2022)  Depression (PHQ2-9): Low Risk  (03/18/2023)  Financial Resource Strain: Low Risk  (06/21/2022)  Physical Activity: Sufficiently Active (06/21/2022)  Social Connections: Moderately Integrated (11/02/2023)  Stress: No Stress Concern Present (06/21/2022)  Tobacco Use: Medium Risk (11/01/2023)   SDOH Interventions:     Readmission Risk Interventions     No data to display

## 2023-11-03 NOTE — Plan of Care (Signed)
  Problem: Clinical Measurements: Goal: Will remain free from infection Outcome: Progressing Goal: Respiratory complications will improve Outcome: Progressing Goal: Cardiovascular complication will be avoided Outcome: Progressing   Problem: Elimination: Goal: Will not experience complications related to urinary retention Outcome: Progressing   Problem: Pain Managment: Goal: General experience of comfort will improve and/or be controlled Outcome: Progressing   Problem: Safety: Goal: Ability to remain free from injury will improve Outcome: Progressing   Problem: Skin Integrity: Goal: Risk for impaired skin integrity will decrease Outcome: Progressing

## 2023-11-03 NOTE — Progress Notes (Signed)
 [facility] PROGRESS NOTE   [name] EXB:[id number] DOB: Oct 27, 1930 DOA: 11/01/2023  PCP: [facility]  Brief History: 89 y.o. female with medical history significant for dementia, CKD 3B, history of CVA, and chronic atrial fibrillation on Xarelto who presents with severe left hip and pelvic pain after a fall.  She was found to have comminuted fracture of the left acetabulum involving both columns with multiple mildly displaced fragments.  Nondisplaced fracture of the left inferior pubic ramus and right superior pubic ramus was also noted.  Orthopedics was consulted.  Patient was hospitalized for further management.  Consultants: Orthopedics  Procedures: None yet    Subjective/Interval History: Patient noted to be mildly distracted this morning.  Denies any chest pain or shortness of breath.  Some pain in the left hip area.     Assessment/Plan:  Left acetabular and pubic rami fractures Seen by orthopedics.  No plans for surgical intervention currently.  Will need to see how she does with PT and OT evaluation.  Touchdown weightbearing.   Does not appear to be in any discomfort this morning.  Chronic atrial fibrillation with RVR She required diltiazem infusion in the emergency department for elevated heart rates.  Subsequently she had a drop in her blood pressure and heart rate was better controlled as well.  So diltiazem infusion was discontinued. Patient currently on metoprolol 25 mg every 6 hours as per cardiology recommendations. Prior to admission she was anticoagulated with Xarelto which is currently on hold in case she needs surgical intervention.  Orthopedics recommending holding Xarelto until we are absolutely certain that she would not need surgery. Heart rate is stable. Continue to monitor closely.  If heart rate becomes elevated again and blood pressure drops then digoxin will be the next step.  Hypotension Probably due to Cardizem  infusion which was discontinued.  Blood pressure has stabilized.    Normocytic anemia/leukocytosis Drop in hemoglobin is likely multifactorial including dilutional effect as well as bleeding from the fracture.  Monitor daily for now. Elevated WBC likely reactive.  She is noted to be afebrile.  Will order a UA.  History of stroke Stable.  Continue statin.  Chronic kidney disease stage IIIb Stable.  Monitor urine output.  History of dementia Noted to be on Namenda.  Abnormal chest x-ray Concern raised for left lung nodule.  Will need a 2 view chest x-ray but unable to do currently due to her fractures.  Can be pursued after discharge.  DVT Prophylaxis: SCDs Code Status: Full code Family Communication: No family at bedside Disposition Plan: Here from ALF.  Will likely need SNF.  Status is: Inpatient Remains inpatient appropriate because: Acetabular fracture requiring surgery.     Medications: Scheduled:  atorvastatin  10 mg Oral Daily   heparin injection (subcutaneous)  5,000 Units Subcutaneous Q8H   memantine  10 mg Oral BID   metoprolol tartrate  25 mg Oral Q6H   Continuous:   NUU:VOZDGUYQ (SUBLIMAZE) injection, methocarbamol, mouth rinse, oxyCODONE, polyethylene glycol, prochlorperazine  Antibiotics: Anti-infectives (From admission, onward)    None       Objective:  Vital Signs  Vitals:   11/03/23 0454 11/03/23 0456 11/03/23 0722 11/03/23 0837  BP: 114/76 114/76 (!) 135/91 102/69  Pulse: (!) 106  79 99  Resp:  20 19   Temp:  98.7 F (37.1 C) 98.7 F (37.1 C)   TempSrc:  Oral Oral   SpO2:  92% 95%   Weight:  Height:        Intake/Output Summary (Last 24 hours) at 11/03/2023 0933 Last data filed at 11/02/2023 1900 Gross per 24 hour  Intake --  Output 300 ml  Net -300 ml   Filed Weights   11/01/23 1651 11/01/23 2249  Weight: 59 kg 61 kg   General appearance: Awake alert.  In no distress Resp: Clear to auscultation bilaterally.  Normal  effort Cardio: S1-S2 is normal regular.  No S3-S4.  No rubs murmurs or bruit GI: Abdomen is soft.  Nontender nondistended.  Bowel sounds are present normal.  No masses organomegaly   Lab Results:  Data Reviewed: I have personally reviewed following labs and reports of the imaging studies  CBC: Recent Labs  Lab 11/01/23 1813 11/02/23 1015 11/03/23 0531  WBC 16.6* 12.4* 15.1*  NEUTROABS 13.8*  --   --   HGB 14.0 12.6 10.8*  HCT 44.1 38.4 32.7*  MCV 95.9 93.7 94.0  PLT 253 187 162    Basic Metabolic Panel: Recent Labs  Lab 11/01/23 1813 11/03/23 0531  NA 136 135  K 4.0 4.6  CL 104 107  CO2 23 18*  GLUCOSE 125* 101*  BUN 26* 21  CREATININE 1.18* 1.12*  CALCIUM 9.9 8.4*  MG  --  2.1  PHOS  --  3.1    GFR: Estimated Creatinine Clearance: 27.6 mL/min (A) (by C-G formula based on SCr of 1.12 mg/dL (H)).  Coagulation Profile: Recent Labs  Lab 11/01/23 1813  INR 1.4*     Recent Results (from the past 240 hours)  Surgical PCR screen     Status: None   Collection Time: 11/02/23  1:41 AM   Specimen: Nasal Mucosa; Nasal Swab  Result Value Ref Range Status   MRSA, PCR NEGATIVE NEGATIVE Final   Staphylococcus aureus NEGATIVE NEGATIVE Final    Comment: (NOTE) The Xpert SA Assay (FDA approved for NASAL specimens in patients 20 years of age and older), is one component of a comprehensive surveillance program. It is not intended to diagnose infection nor to guide or monitor treatment. Performed at [facility], [address], [place], [place] [address]       Radiology Studies: ECHOCARDIOGRAM COMPLETE Result Date: 11/02/2023    ECHOCARDIOGRAM REPORT   Patient Name:   [name] Date of Exam: 11/02/2023 Medical Rec #:  [id number]               Height:       63.0 in Accession #:    [id number]              Weight:       134.5 lb Date of Birth:  12/31/1930               BSA:          1.633 m Patient Age:    89 years                BP:           117/66  mmHg Patient Gender: F                       HR:           86 bpm. Exam Location:  Inpatient Procedure: 2D Echo, Cardiac Doppler and Color Doppler (Both Spectral and Color            Flow Doppler were utilized during procedure). Indications:    Atrial  Fibrillation I48.91  History:        Patient has no prior history of Echocardiogram examinations.                 Stroke and CKD, stage 3, Arrythmias:Bradycardia; Risk                 Factors:Dyslipidemia.  Sonographer:    Lucendia Herrlich RCS Referring Phys: [phone] SUBRINA SUNDIL IMPRESSIONS  1. Left ventricular ejection fraction, by estimation, is 60 to 65%. The left ventricle has normal function. The left ventricle has no regional wall motion abnormalities. Left ventricular diastolic function could not be evaluated. There is the interventricular septum is flattened in diastole ('D' shaped left ventricle), consistent with right ventricular volume overload.  2. Right ventricular systolic function is mildly reduced. The right ventricular size is mildly enlarged. There is moderately elevated pulmonary artery systolic pressure.  3. Left atrial size was severely dilated.  4. Right atrial size was severely dilated.  5. The mitral valve is normal in structure. Mild mitral valve regurgitation.  6. The tricuspid valve is abnormal. Tricuspid valve regurgitation is moderate to severe.  7. The aortic valve is tricuspid. Aortic valve regurgitation is trivial. No aortic stenosis is present.  8. The inferior vena cava is dilated in size with >50% respiratory variability, suggesting right atrial pressure of 8 mmHg. FINDINGS  Left Ventricle: Left ventricular ejection fraction, by estimation, is 60 to 65%. The left ventricle has normal function. The left ventricle has no regional wall motion abnormalities. The left ventricular internal cavity size was normal in size. There is  no left ventricular hypertrophy. The interventricular septum is flattened in diastole ('D' shaped left  ventricle), consistent with right ventricular volume overload. Left ventricular diastolic function could not be evaluated due to atrial fibrillation. Left ventricular diastolic function could not be evaluated. Right Ventricle: The right ventricular size is mildly enlarged. No increase in right ventricular wall thickness. Right ventricular systolic function is mildly reduced. There is moderately elevated pulmonary artery systolic pressure. The tricuspid regurgitant velocity is 3.14 m/s, and with an assumed right atrial pressure of 8 mmHg, the estimated right ventricular systolic pressure is 47.5 mmHg. Left Atrium: Left atrial size was severely dilated. Right Atrium: Right atrial size was severely dilated. Pericardium: There is no evidence of pericardial effusion. Mitral Valve: The mitral valve is normal in structure. Mild mitral annular calcification. Mild mitral valve regurgitation, with centrally-directed jet. Tricuspid Valve: The tricuspid valve is abnormal. Tricuspid valve regurgitation is moderate to severe. Aortic Valve: The aortic valve is tricuspid. Aortic valve regurgitation is trivial. No aortic stenosis is present. Pulmonic Valve: The pulmonic valve was grossly normal. Pulmonic valve regurgitation is not visualized. No evidence of pulmonic stenosis. Aorta: The aortic root and ascending aorta are structurally normal, with no evidence of dilitation. Venous: The inferior vena cava is dilated in size with greater than 50% respiratory variability, suggesting right atrial pressure of 8 mmHg. IAS/Shunts: No atrial level shunt detected by color flow Doppler.  LEFT VENTRICLE PLAX 2D LVIDd:         4.16 cm LVIDs:         2.81 cm LV PW:         1.01 cm LV IVS:        0.91 cm  RIGHT VENTRICLE RV Basal diam:  4.39 cm LEFT ATRIUM         Index       RIGHT ATRIUM  Index LA diam:    4.66 cm 2.85 cm/m  RA Area:     27.06 cm                                 RA Volume:   87.81 ml  53.76 ml/m   AORTA Ao Root diam:  2.74 cm TRICUSPID VALVE TR Peak grad:   39.5 mmHg TR Vmax:        314.42 cm/s Thurmon Fair MD Electronically signed by Thurmon Fair MD Signature Date/Time: 11/02/2023/2:15:30 PM    Final    CT PELVIS WO CONTRAST Result Date: 11/01/2023 CLINICAL DATA:  Hip trauma, fracture seen on prior x-ray. Pain with movement. EXAM: CT PELVIS WITHOUT CONTRAST TECHNIQUE: Multidetector CT imaging of the pelvis was performed following the standard protocol without intravenous contrast. RADIATION DOSE REDUCTION: This exam was performed according to the departmental dose-optimization program which includes automated exposure control, adjustment of the mA and/or kV according to patient size and/or use of iterative reconstruction technique. COMPARISON:  Same day hip x-ray. FINDINGS: Urinary Tract:  No abnormality visualized. Bowel:  Unremarkable visualized pelvic bowel loops. Vascular/Lymphatic: No pathologically enlarged lymph nodes. Aortic atherosclerotic calcification. Reproductive:  No mass or other significant abnormality Other:  Small hematoma along the left pelvic sidewall. Musculoskeletal: Comminuted fracture of the left acetabulum with multiple mildly displaced fragments. Fracture lines extend superiorly into the iliac wing, medially through the medial acetabulum and through the posterior acetabulum. Anterior fracture lines extend into the base of the superior pubic ramus. Nondisplaced fracture of the left inferior pubic ramus near the pubic body (series 7/image 111) and buckle fracture of the left mid inferior pubic ramus. Buckle fracture of the right superior pubic ramus near the pubic body. Thickening of the left iliacus muscle compatible with hematoma IMPRESSION: 1. Comminuted fracture of the left acetabulum involving both columns with multiple mildly displaced fragments. 2. Nondisplaced fracture of the left inferior pubic ramus near the pubic body and buckle fracture of the left mid inferior pubic ramus. 3. Buckle  fracture of the right superior pubic ramus near the pubic body. 4. Small hematoma along the left pelvic sidewall. Intramuscular hematoma in the left iliacus. Electronically Signed   By: Minerva Fester M.D.   On: 11/01/2023 19:22   CT Head Wo Contrast Result Date: 11/01/2023 CLINICAL DATA:  Head trauma, minor (Age >= 65y).  Witnessed fall. EXAM: CT HEAD WITHOUT CONTRAST TECHNIQUE: Contiguous axial images were obtained from the base of the skull through the vertex without intravenous contrast. RADIATION DOSE REDUCTION: This exam was performed according to the departmental dose-optimization program which includes automated exposure control, adjustment of the mA and/or kV according to patient size and/or use of iterative reconstruction technique. COMPARISON:  Head MRI 05/13/2013 FINDINGS: Brain: There is no evidence of an acute infarct, intracranial hemorrhage, mass, midline shift, or extra-axial fluid collection. There is mild cerebral atrophy. Cerebral white matter hypodensities are nonspecific but compatible with mild chronic small vessel ischemic disease. There is an unchanged small chronic infarct involving the left insula and underlying white matter. Vascular: Calcified atherosclerosis at the skull base. No hyperdense vessel. Skull: No acute fracture or suspicious lesion. Sinuses/Orbits: Visualized paranasal sinuses and mastoid air cells are clear. Bilateral cataract extraction. Other: Possible small left frontal scalp contusion. IMPRESSION: 1. No evidence of acute intracranial abnormality. 2. Mild chronic small vessel ischemic disease and cerebral atrophy. Electronically Signed   By: Jolaine Click.D.  On: 11/01/2023 19:06   DG Chest 1 View Result Date: 11/01/2023 CLINICAL DATA:  Fall.  Left hip injury. EXAM: CHEST  1 VIEW COMPARISON:  Chest radiographs 10/14/2018 and 08/08/2016. FINDINGS: 1733 hours. The heart appears mildly enlarged but stable. There is aortic atherosclerosis. There is a new nodular  density projecting over the lateral aspect of the left 8th rib which could relate to a healed fracture or nipple shadow. A peripheral subpleural pulmonary nodule is considered less likely. The lungs are otherwise clear. There is no pleural effusion or pneumothorax. No acute fractures are identified in the chest. IMPRESSION: 1. No evidence of acute chest injury. 2. New nodular density projecting over the lateral aspect of the left 8th rib, possibly a healed fracture or nipple shadow. Recommend two view chest radiographs when the patient is able to exclude a pulmonary nodule. Electronically Signed   By: Carey Bullocks M.D.   On: 11/01/2023 17:55   DG Hip Unilat With Pelvis 2-3 Views Left Result Date: 11/01/2023 CLINICAL DATA:  [facility] EXAM: DG HIP (WITH OR WITHOUT PELVIS) 2-3V LEFT COMPARISON:  None Available. FINDINGS: AP pelvis with AP and cross-table lateral views of the left hip. The bones appear adequately mineralized. There is a comminuted fracture of the left acetabulum which extends into the left iliac bone. The left superior pubic ramus appears disrupted. There is resulting superior displacement of the left femoral head. No evidence of proximal femur fracture or dislocation. The sacroiliac joints appear intact. Iliofemoral atherosclerosis noted. IMPRESSION: Comminuted fracture of the left acetabulum extending into the left iliac bone with disruption of the left superior pubic ramus and superior displacement of the left femoral head. Recommend further evaluation with pelvic CT. Electronically Signed   By: Carey Bullocks M.D.   On: 11/01/2023 17:51       LOS: 2 days   Ferris Fielden Rito Ehrlich  [facility] Pager on [email or web address]  11/03/2023, 9:33 AM

## 2023-11-03 NOTE — Progress Notes (Signed)
 Initial Nutrition Assessment  DOCUMENTATION CODES:   Not applicable  INTERVENTION:  Ensure Enlive po BID, each supplement provides 350 kcal and 20 grams of protein.  Magic cup TID with dinner, each supplement provides 290 kcal and 9 grams of protein  Multivitamin with minerals daily.  Assisted in ordering lunch on food service software.   NUTRITION DIAGNOSIS:   Increased nutrient needs related to hip fracture as evidenced by estimated needs.  GOAL:   Patient will meet greater than or equal to 90% of their needs  MONITOR:   PO intake, Supplement acceptance  REASON FOR ASSESSMENT:   Consult Assessment of nutrition requirement/status  ASSESSMENT:   PMH dementia, CKD 3B, history of CVA, and chronic atrial fibrillation on Xarelto who presents with severe left hip and pelvic pain after a fall. No surgical intervention planned.   Diet advanced on 3/30 from NPO to Regular. RD met with pt and pt's cousin at bedside. Pt reports poor appetite since fall on Friday. Pt reports usual appetite/intake is very good and eats 3 meals a day at baseline. Pt with some confusion and couldn't remember if Breakfast had been delivered this morning. RD assisted with ordering lunch and left Room service menu at bedside. Pt report liking chocolate and was willing to try ONS. Pt likes most foods but reports not liking fruit. Pt declines any wt loss in past year. Pt denies any trouble chewing/swallowing.   Medications reviewed.  Labs reviewed: Creatinine 1.12   Intake/Output Summary (Last 24 hours) at 11/03/2023 1048 Last data filed at 11/02/2023 1900 Gross per 24 hour  Intake --  Output 300 ml  Net -300 ml    Weights reviewed. No wt loss noted in chart hx. Admit weight 61kg.    NUTRITION - FOCUSED PHYSICAL EXAM:  Flowsheet Row Most Recent Value  Orbital Region No depletion  Upper Arm Region No depletion  Thoracic and Lumbar Region No depletion  Buccal Region No depletion  Temple Region  No depletion  Clavicle Bone Region No depletion  Clavicle and Acromion Bone Region No depletion  Scapular Bone Region No depletion  Dorsal Hand No depletion  Patellar Region No depletion  Anterior Thigh Region No depletion  Posterior Calf Region No depletion  Edema (RD Assessment) None  Hair Reviewed  Eyes Reviewed  Mouth Reviewed  Skin Reviewed  Nails Reviewed       Diet Order:   Diet Order             Diet regular Room service appropriate? Yes; Fluid consistency: Thin  Diet effective now                   EDUCATION NEEDS:   Education needs have been addressed  Skin:  Skin Assessment: Reviewed RN Assessment  Last BM:  PTA  Height:   Ht Readings from Last 1 Encounters:  11/01/23 5\' 3"  (1.6 m)    Weight:   Wt Readings from Last 1 Encounters:  11/01/23 61 kg    Ideal Body Weight:  52.3 kg  BMI:  Body mass index is 23.82 kg/m.  Estimated Nutritional Needs:   Kcal:  1550-1800  Protein:  80-100  Fluid:  >1.5L  Kathrynn Speed, MPH, RD, LDN Clinical Dietitian Contact information can be found at [facility].

## 2023-11-03 NOTE — Evaluation (Addendum)
 Physical Therapy Evaluation Patient Details Name: [name] MRN: [id number] DOB: 02-09-1931 Today's Date: 11/03/2023  History of Present Illness  89 y.o. female adm 3/29 with medical history significant for dementia, CKD 3B, history of CVA, and chronic atrial fibrillation on Xarelto who presented after a fall.  CT Pelvis:  Comminuted fracture of the left acetabulum involving both columns  with multiple mildly displaced fragments.  2. Nondisplaced fracture of the left inferior pubic ramus near the  pubic body and buckle fracture of the left mid inferior pubic ramus.  3. Buckle fracture of the right superior pubic ramus near the pubic  body.  4. Small hematoma along the left pelvic sidewall. Intramuscular  hematoma in the left iliacus.  Clinical Impression  Patient presents with decreased mobility due to pain, decreased AROM/strength and limited weightbearing L LE.  Previously living at ALF and active in all activities offered at her ALF.  Currently needing +2 A for up to recliner in [place] with max cues for maintaining weight bearing status.  She will benefit from skilled PT in the acute setting and from follow up inpatient rehab (<3 hours/day) prior to d/c back to ALF.       If plan is discharge home, recommend the following: Two people to help with walking and/or transfers;Two people to help with bathing/dressing/bathroom;Assist for transportation;Assistance with cooking/housework   Can travel by private vehicle   No    Equipment Recommendations Other (comment) (TBA)  Recommendations for Other Services       Functional Status Assessment Patient has had a recent decline in their functional status and demonstrates the ability to make significant improvements in function in a reasonable and predictable amount of time.     Precautions / Restrictions Precautions Precautions: Fall Restrictions Weight Bearing Restrictions Per Provider Order: Yes LLE Weight Bearing Per Provider  Order: Touchdown weight bearing (bed to chair only)      Mobility  Bed Mobility Overal bed mobility: Needs Assistance Bed Mobility: Supine to Sit     Supine to sit: Max assist, HOB elevated, +2 for safety/equipment     General bed mobility comments: assist to lift trunk, scoot hips and move L LE toward EOB    Transfers Overall transfer level: Needs assistance Equipment used: Ambulation equipment used Transfers: Sit to/from Stand, Bed to chair/wheelchair/BSC Sit to Stand: Mod assist, +2 safety/equipment             Transfer via Lift Equipment: Stedy  Ambulation/Gait               General Gait Details: NT pt bed to chair only with L hip fx  Stairs            Wheelchair Mobility     Tilt Bed    Modified Rankin (Stroke Patients Only)       Balance Overall balance assessment: Needs assistance Sitting-balance support: Feet supported Sitting balance-Leahy Scale: Poor Sitting balance - Comments: initially leaning R though corrected with cues and CGA Postural control: Right lateral lean Standing balance support: Bilateral upper extremity supported Standing balance-Leahy Scale: Poor Standing balance comment: UE support and mod A for brief standing prior to using Stedy for pivot to recliner                             Pertinent Vitals/Pain Pain Assessment Pain Assessment: Faces Faces Pain Scale: Hurts even more Pain Location: L hip with mobility Pain Descriptors / Indicators: Grimacing,  Sore, Guarding Pain Intervention(s): Monitored during session, Repositioned, Limited activity within patient's tolerance    Home Living Family/patient expects to be discharged to:: Assisted living                 Home Equipment: Shower seat;Grab bars - tub/shower;Grab bars - toilet Additional Comments: Memory Care    Prior Function Prior Level of Function : Needs assist             Mobility Comments: Patient states completes her own ADL.   Facility assists with medications and meals.  Patient walks to the dinning room       Extremity/Trunk Assessment   Upper Extremity Assessment Upper Extremity Assessment: Defer to OT evaluation    Lower Extremity Assessment Lower Extremity Assessment: RLE deficits/detail;LLE deficits/detail RLE Deficits / Details: AAROM mildly limited though generally WFL, strength at least 3/5 knee and ankle. limited hip flexion strength due to pain LLE Deficits / Details: AAROM limited by pain esp with L hip adduction for OOB and limited hip flexion in sitting pt off loading hip leaning R. LLE: Unable to fully assess due to pain    Cervical / Trunk Assessment Cervical / Trunk Assessment: Kyphotic  Communication   Communication Communication: No apparent difficulties Factors Affecting Communication: Hearing impaired    Cognition Arousal: Alert Behavior During Therapy: WFL for tasks assessed/performed   PT - Cognitive impairments: History of cognitive impairments                       PT - Cognition Comments: cousin arrived during session and corrected some of patient's prior functional history report Following commands: Intact       Cueing       General Comments General comments (skin integrity, edema, etc.): Cousin in the room and supportive, was a Child psychotherapist.  BP stable in sitting on EOB    Exercises     Assessment/Plan    PT Assessment Patient needs continued PT services  PT Problem List Decreased range of motion;Decreased strength;Decreased mobility;Decreased balance;Decreased activity tolerance;Pain       PT Treatment Interventions DME instruction;Therapeutic exercise;Balance training;Neuromuscular re-education;Functional mobility training;Therapeutic activities;Patient/family education    PT Goals (Current goals can be found in the Care Plan section)  Acute Rehab PT Goals Patient Stated Goal: for rehab PT Goal Formulation: With patient/family Time For Goal  Achievement: 11/17/23 Potential to Achieve Goals: Fair    Frequency Min 3X/week     Co-evaluation PT/OT/SLP Co-Evaluation/Treatment: Yes Reason for Co-Treatment: To address functional/ADL transfers;For patient/therapist safety;Complexity of the patient's impairments (multi-system involvement) PT goals addressed during session: Mobility/safety with mobility;Strengthening/ROM OT goals addressed during session: ADL's and self-care       AM-PAC PT "6 Clicks" Mobility  Outcome Measure Help needed turning from your back to your side while in a flat bed without using bedrails?: A Lot Help needed moving from lying on your back to sitting on the side of a flat bed without using bedrails?: Total Help needed moving to and from a bed to a chair (including a wheelchair)?: Total Help needed standing up from a chair using your arms (e.g., wheelchair or bedside chair)?: Total Help needed to walk in hospital room?: Total Help needed climbing 3-5 steps with a railing? : Total 6 Click Score: 7    End of Session Equipment Utilized During Treatment: Gait belt Activity Tolerance: Patient limited by pain Patient left: in chair;with call bell/phone within reach;with family/visitor present;with nursing/sitter in room Nurse Communication: Need  for lift equipment;Mobility status PT Visit Diagnosis: Difficulty in walking, not elsewhere classified (R26.2);Muscle weakness (generalized) (M62.81);Pain Pain - Right/Left: Left Pain - part of body: Hip    Time: 1610-9604 PT Time Calculation (min) (ACUTE ONLY): 25 min   Charges:   PT Evaluation $PT Eval Moderate Complexity: 1 Mod   PT General Charges $$ ACUTE PT VISIT: 1 Visit         Sheran Lawless, PT Acute Rehabilitation Services [phone] 11/03/2023   Elray Mcgregor 11/03/2023, 12:34 PM

## 2023-11-03 NOTE — Progress Notes (Signed)
 Orthopaedic Trauma Service Progress Note  Patient ID: [name] MRN: [id number] DOB/AGE: [birth year] 89 y.o.  Subjective:  No complaints Still denies pain currently  Did not work with therapy yesterday   Cousin at bedside  On Xarelto at baseline (15 mg daily based of her CrCl) for stroke prophylaxis.   ROS Dementia   Objective:   VITALS:   Vitals:   11/03/23 0454 11/03/23 0456 11/03/23 0722 11/03/23 0837  BP: 114/76 114/76 (!) 135/91 102/69  Pulse: (!) 106  79 99  Resp:  20 19   Temp:  98.7 F (37.1 C) 98.7 F (37.1 C)   TempSrc:  Oral Oral   SpO2:  92% 95%   Weight:      Height:        Estimated body mass index is 23.82 kg/m as calculated from the following:   Height as of this encounter: 5\' 3"  (1.6 m).   Weight as of this encounter: 61 kg.   Intake/Output      03/30 0701 03/31 0700 03/31 0701 04/01 0700   I.V. (mL/kg)     IV Piggyback     Total Intake(mL/kg)     Urine (mL/kg/hr) 300 (0.2)    Total Output 300    Net -300           LABS  Results for orders placed or performed during the hospital encounter of 11/01/23 (from the past 24 hours)  CBC     Status: Abnormal   Collection Time: 11/03/23  5:31 AM  Result Value Ref Range   WBC 15.1 (H) 4.0 - 10.5 K/uL   RBC 3.48 (L) 3.87 - 5.11 MIL/uL   Hemoglobin 10.8 (L) 12.0 - 15.0 g/dL   HCT 08.6 (L) 57.8 - 46.9 %   MCV 94.0 80.0 - 100.0 fL   MCH 31.0 26.0 - 34.0 pg   MCHC 33.0 30.0 - 36.0 g/dL   RDW 62.9 52.8 - 41.3 %   Platelets 162 150 - 400 K/uL   nRBC 0.0 0.0 - 0.2 %  Basic metabolic panel with GFR     Status: Abnormal   Collection Time: 11/03/23  5:31 AM  Result Value Ref Range   Sodium 135 135 - 145 mmol/L   Potassium 4.6 3.5 - 5.1 mmol/L   Chloride 107 98 - 111 mmol/L   CO2 18 (L) 22 - 32 mmol/L   Glucose, Bld 101 (H) 70 - 99 mg/dL   BUN 21 8 - 23 mg/dL   Creatinine, Ser 2.44 (H) 0.44 - 1.00 mg/dL    Calcium 8.4 (L) 8.9 - 10.3 mg/dL   GFR, Estimated 47 (L) >60 mL/min   Anion gap 10 5 - 15  Magnesium     Status: None   Collection Time: 11/03/23  5:31 AM  Result Value Ref Range   Magnesium 2.1 1.7 - 2.4 mg/dL  Phosphorus     Status: None   Collection Time: 11/03/23  5:31 AM  Result Value Ref Range   Phosphorus 3.1 2.5 - 4.6 mg/dL     PHYSICAL EXAM:   Gen: was sleeping soundly upon entrance to room, NAD, comfortable, aroused with ease. No pain  Ext:       Left Lower extremity   Motor and sensory functions intact  Ext warm   No  pain with log roll or axial load   No changes in exam from yesterday    Assessment/Plan:     Principal Problem:   Closed left acetabular fracture (HCC) Active Problems:   Chronic atrial fibrillation with RVR (HCC)   CKD (chronic kidney disease) stage 3, GFR 30-59 ml/min (HCC)   Moderate dementia without behavioral disturbance, psychotic disturbance, mood disturbance, or anxiety (HCC)   History of stroke   Anti-infectives (From admission, onward)    None     .  POD/HD#: 54  89 year old female ground-level fall with left anterior, posterior hemitransverse acetabular fracture   -Ground-level fall   -Fragility fracture left acetabulum--> anterior column posterior hemitransverse               Continue with nonoperative management    Therapy evals today    Touchdown weightbearing left leg               Monitor    Baseline Judet views   - Medical issues                Per primary   - DVT/PE prophylaxis:               Would hold DOAC for now okay to use Lovenox or subcu heparin   - Metabolic Bone Disease:               Osteopenia               Vitamin D   - Activity:               Therapy evals, touchdown weightbearing left leg   - Impediments to fracture healing:               Osteopenia               Ability to maintain weightbearing restrictions   - Dispo:               Therapy evals               Continue with  nonoperative management left acetabulum for now    Mearl Latin, PA-C [phone] (C) 11/03/2023, 10:27 AM  [facility] [address] [place] [address] [phone] Collier Bullock (F)    After 5pm and on the weekends please log on to Amion, go to orthopaedics and the look under the [facility] Call for the provider(s) on call. You can also call our office at [phone] and then follow the prompts to be connected to the call team.  Patient ID: [name], female   DOB: 07/18/1931, 89 y.o.   MRN: [id number]

## 2023-11-03 NOTE — NC FL2 (Signed)
 [place] MEDICAID FL2 LEVEL OF CARE FORM     IDENTIFICATION  Patient Name: [name] Birthdate: Jan 28, 1931 Sex: female Admission Date (Current Location): 11/01/2023  [facility] and [place] Number:  Producer, television/film/video and Address:  [facility], [address], [place], [place] [address]      Provider Number: [id number]  Attending Physician Name and Address:  Osvaldo Shipper, MD  Relative Name and Phone Number:  Purnell Shoemaker [phone]    Current Level of Care: Hospital Recommended Level of Care: Skilled Nursing Facility Prior Approval Number:    Date Approved/Denied:   PASRR Number: [id number]  Discharge Plan: SNF    Current Diagnoses: Patient Active Problem List   Diagnosis Date Noted   Closed left acetabular fracture (HCC) 11/01/2023   History of stroke 11/01/2023   Moderate dementia without behavioral disturbance, psychotic disturbance, mood disturbance, or anxiety (HCC) 03/18/2023   Educated about COVID-19 virus infection 08/09/2019   CKD (chronic kidney disease) stage 3, GFR 30-59 ml/min (HCC) 03/09/2019   Chronic anticoagulation 03/09/2019   Acquired trigger finger of right ring finger 10/26/2018   AV malformation of gastrointestinal tract    Primary osteoarthritis involving multiple joints 02/07/2015   High risk medication use 06/21/2013   Dizziness    Restless leg syndrome 03/01/2013   Chronic atrial fibrillation with RVR (HCC) 11/03/2008   Hyperlipidemia 10/24/2008   ANEMIA, IRON DEFICIENCY, MICROCYTIC 10/24/2008   SINUS BRADYCARDIA 10/24/2008   CVA 10/24/2008   Low bone mass 10/24/2008    Orientation RESPIRATION BLADDER Height & Weight     Self, Situation  O2 (Nasal Cannula 4 liters)   Weight: 134 lb 7.7 oz (61 kg) Height:  5\' 3"  (160 cm)  BEHAVIORAL SYMPTOMS/MOOD NEUROLOGICAL BOWEL NUTRITION STATUS      Continent (WDL) Diet (Please see discharge summary)  AMBULATORY STATUS COMMUNICATION OF NEEDS Skin   Extensive  Assist Verbally Other (Comment) (Abrasion,Leg,R,Lower,Wound/Incision LDAs)                       Personal Care Assistance Level of Assistance  Bathing, Feeding, Dressing Bathing Assistance: Maximum assistance Feeding assistance: Independent Dressing Assistance: Maximum assistance     Functional Limitations Info  Sight, Hearing, Speech   Hearing Info: Impaired Speech Info: Adequate    SPECIAL CARE FACTORS FREQUENCY  PT (By licensed PT), OT (By licensed OT)     PT Frequency: 5x min weekly OT Frequency: 5x min weekly            Contractures Contractures Info: Not present    Additional Factors Info  Code Status, Allergies, Psychotropic Code Status Info: FULL Allergies Info: NKA Psychotropic Info: memantine (NAMENDA) tablet 10 mg 2 times daily         Current Medications (11/03/2023):  This is the current hospital active medication list Current Facility-Administered Medications  Medication Dose Route Frequency Provider Last Rate Last Admin   atorvastatin (LIPITOR) tablet 10 mg  10 mg Oral Daily Opyd, Lavone Neri, MD   10 mg at 11/03/23 0837   feeding supplement (ENSURE ENLIVE / ENSURE PLUS) liquid 237 mL  237 mL Oral BID BM Osvaldo Shipper, MD   237 mL at 11/03/23 1312   fentaNYL (SUBLIMAZE) injection 12.5-50 mcg  12.5-50 mcg Intravenous Q2H PRN Briscoe Deutscher, MD   50 mcg at 11/01/23 2145   heparin injection 5,000 Units  5,000 Units Subcutaneous Q8H Osvaldo Shipper, MD   5,000 Units at 11/03/23 1312   memantine (  NAMENDA) tablet 10 mg  10 mg Oral BID Opyd, Lavone Neri, MD   10 mg at 11/03/23 0102   methocarbamol (ROBAXIN) tablet 500 mg  500 mg Oral Q6H PRN Opyd, Lavone Neri, MD       metoprolol tartrate (LOPRESSOR) tablet 25 mg  25 mg Oral Q6H Sundil, Subrina, MD   25 mg at 11/03/23 7253   multivitamin with minerals tablet 1 tablet  1 tablet Oral Daily Osvaldo Shipper, MD   1 tablet at 11/03/23 1137   Oral care mouth rinse  15 mL Mouth Rinse PRN Opyd, Lavone Neri, MD        oxyCODONE (Oxy IR/ROXICODONE) immediate release tablet 2.5-5 mg  2.5-5 mg Oral Q4H PRN Opyd, Lavone Neri, MD   5 mg at 11/03/23 6644   polyethylene glycol (MIRALAX / GLYCOLAX) packet 17 g  17 g Oral Daily PRN Opyd, Lavone Neri, MD       prochlorperazine (COMPAZINE) injection 5 mg  5 mg Intravenous Q6H PRN Opyd, Lavone Neri, MD         Discharge Medications: Please see discharge summary for a list of discharge medications.  Relevant Imaging Results:  Relevant Lab Results:   Additional Information [id number]  Delilah Shan, LCSWA

## 2023-11-03 NOTE — Progress Notes (Signed)
 Physical Therapy Treatment Patient Details Name: [name] MRN: [id number] DOB: [birth year] Today's Date: 11/03/2023   History of Present Illness 89 y.o. female adm 3/29 with medical history significant for dementia, CKD 3B, history of CVA, and chronic atrial fibrillation on Xarelto who presented after a fall.  CT Pelvis:  Comminuted fracture of the left acetabulum involving both columns  with multiple mildly displaced fragments.  2. Nondisplaced fracture of the left inferior pubic ramus near the  pubic body and buckle fracture of the left mid inferior pubic ramus.  3. Buckle fracture of the right superior pubic ramus near the pubic  body.  4. Small hematoma along the left pelvic sidewall. Intramuscular  hematoma in the left iliacus.    PT Comments  Saw pt for second session to assist her back to the bed. Used [employer] which pt does well with but difficulty maintaining weight bearing restrictions on LLE. Patient will benefit from continued inpatient follow up therapy, <3 hours/day.      If plan is discharge home, recommend the following: Two people to help with walking and/or transfers;Two people to help with bathing/dressing/bathroom;Assist for transportation;Assistance with cooking/housework   Can travel by private vehicle     No  Equipment Recommendations  Other (comment) (TBA)    Recommendations for Other Services       Precautions / Restrictions Precautions Precautions: Fall Recall of Precautions/Restrictions: Impaired Restrictions Weight Bearing Restrictions Per Provider Order: Yes LLE Weight Bearing Per Provider Order: Touchdown weight bearing (bed to chair only)     Mobility  Bed Mobility Overal bed mobility: Needs Assistance Bed Mobility: Sit to Supine       Sit to supine: +2 for physical assistance, Max assist   General bed mobility comments: Assist to lower trunk and bring legs up into bed    Transfers Overall transfer level: Needs  assistance Equipment used: Ambulation equipment used Transfers: Sit to/from Stand, Bed to chair/wheelchair/BSC Sit to Stand: Mod assist, +2 safety/equipment           General transfer comment: Assist to bring hips up using bed pad under hips. Verbal/tactile cues to keep weight off of LLE. Pt with difficulty maintaining. Transfer via Lift Equipment: Stedy  Ambulation/Gait               General Gait Details: NT pt bed to chair only with L hip fx   Stairs             Wheelchair Mobility     Tilt Bed    Modified Rankin (Stroke Patients Only)       Balance Overall balance assessment: Needs assistance Sitting-balance support: Feet supported Sitting balance-Leahy Scale: Poor Sitting balance - Comments: UE support and verbal cues Postural control: Right lateral lean Standing balance support: Bilateral upper extremity supported Standing balance-Leahy Scale: Poor Standing balance comment: UE support and mod A for brief standing prior to using Stedy for pivot to recliner                            Communication Communication Communication: Impaired Factors Affecting Communication: Hearing impaired  Cognition Arousal: Alert Behavior During Therapy: WFL for tasks assessed/performed   PT - Cognitive impairments: History of cognitive impairments                         Following commands: Intact      Cueing Cueing Techniques: Verbal cues, Gestural cues  Exercises      General Comments        Pertinent Vitals/Pain Pain Assessment Pain Assessment: Faces Faces Pain Scale: Hurts even more Pain Location: L hip with mobility Pain Descriptors / Indicators: Grimacing, Sore, Guarding Pain Intervention(s): Monitored during session, Repositioned, Patient requesting pain meds-RN notified    Home Living                          Prior Function            PT Goals (current goals can now be found in the care plan section) Acute  Rehab PT Goals Patient Stated Goal: for rehab Progress towards PT goals: Progressing toward goals    Frequency    Min 2X/week      PT Plan      Co-evaluation              AM-PAC PT "6 Clicks" Mobility   Outcome Measure  Help needed turning from your back to your side while in a flat bed without using bedrails?: A Lot Help needed moving from lying on your back to sitting on the side of a flat bed without using bedrails?: Total Help needed moving to and from a bed to a chair (including a wheelchair)?: Total Help needed standing up from a chair using your arms (e.g., wheelchair or bedside chair)?: Total Help needed to walk in hospital room?: Total Help needed climbing 3-5 steps with a railing? : Total 6 Click Score: 7    End of Session Equipment Utilized During Treatment: Gait belt Activity Tolerance: Patient limited by pain Patient left: with call bell/phone within reach;in bed;with bed alarm set Nurse Communication: Need for lift equipment;Mobility status PT Visit Diagnosis: Difficulty in walking, not elsewhere classified (R26.2);Muscle weakness (generalized) (M62.81);Pain Pain - Right/Left: Left Pain - part of body: Hip     Time: 0981-1914 PT Time Calculation (min) (ACUTE ONLY): 20 min  Charges:    $Therapeutic Activity: 8-22 mins PT General Charges $$ ACUTE PT VISIT: 1 Visit                     [facility] PT Acute Rehabilitation Services Office [phone]    Angelina Ok [facility] 11/03/2023, 6:05 PM

## 2023-11-03 NOTE — Evaluation (Signed)
 Occupational Therapy Evaluation Patient Details Name: [name] MRN: [id number] DOB: 01/30/1931 Today's Date: 11/03/2023   History of Present Illness   89 y.o. female adm 3/29 with medical history significant for dementia, CKD 3B, history of CVA, and chronic atrial fibrillation on Xarelto who presented after a fall.  CT Pelvis:  Comminuted fracture of the left acetabulum involving both columns  with multiple mildly displaced fragments.  2. Nondisplaced fracture of the left inferior pubic ramus near the  pubic body and buckle fracture of the left mid inferior pubic ramus.  3. Buckle fracture of the right superior pubic ramus near the pubic  body.  4. Small hematoma along the left pelvic sidewall. Intramuscular  hematoma in the left iliacus.     Clinical Impressions Patient admitted for the diagnosis above.  PTA she resides at a local ALF, and remained Mod I with ADL due to cognitive deficits, and walked throughout her memory care unit without an AD.  Pain is the promary deficits, but TDWB to LLE is limiting independence.  Patient is needing Mod A of 2 and use of Stedy for OOB, and up to Max A for lower body ADL seated.  OT will continue efforts in the acute setting to address deficits, and Patient will benefit from continued inpatient follow up therapy, <3 hours/day.      If plan is discharge home, recommend the following:   A lot of help with walking and/or transfers;A lot of help with bathing/dressing/bathroom;Supervision due to cognitive status     Functional Status Assessment   Patient has had a recent decline in their functional status and demonstrates the ability to make significant improvements in function in a reasonable and predictable amount of time.     Equipment Recommendations   BSC/3in1     Recommendations for Other Services         Precautions/Restrictions   Precautions Precautions: Fall Recall of Precautions/Restrictions:  Impaired Restrictions Weight Bearing Restrictions Per Provider Order: Yes LLE Weight Bearing Per Provider Order: Touchdown weight bearing     Mobility Bed Mobility Overal bed mobility: Needs Assistance Bed Mobility: Supine to Sit     Supine to sit: Max assist       Patient Response: Cooperative  Transfers Overall transfer level: Needs assistance Equipment used: Ambulation equipment used Transfers: Sit to/from Stand, Bed to chair/wheelchair/BSC Sit to Stand: Mod assist             Transfer via Lift Equipment: Stedy    Balance Overall balance assessment: Needs assistance Sitting-balance support: Feet supported Sitting balance-Leahy Scale: Fair   Postural control: Right lateral lean Standing balance support: Reliant on assistive device for balance Standing balance-Leahy Scale: Poor                             ADL either performed or assessed with clinical judgement   ADL Overall ADL's : Needs assistance/impaired Eating/Feeding: Set up;Sitting   Grooming: Wash/dry hands;Wash/dry face;Contact guard assist;Sitting   Upper Body Bathing: Contact guard assist;Sitting   Lower Body Bathing: Sitting/lateral leans;Moderate assistance   Upper Body Dressing : Contact guard assist;Sitting   Lower Body Dressing: Maximal assistance;Sitting/lateral leans   Toilet Transfer: Moderate assistance;+2 for safety/equipment;Squat-pivot                   Vision Patient Visual Report: No change from baseline       Perception Perception: Not tested  Praxis Praxis: Not tested       Pertinent Vitals/Pain Pain Assessment Pain Assessment: Faces Faces Pain Scale: Hurts even more Pain Descriptors / Indicators: Grimacing, Sore Pain Intervention(s): Monitored during session     Extremity/Trunk Assessment Upper Extremity Assessment Upper Extremity Assessment: Overall WFL for tasks assessed   Lower Extremity Assessment Lower Extremity Assessment:  Defer to PT evaluation   Cervical / Trunk Assessment Cervical / Trunk Assessment: Kyphotic   Communication Communication Communication: No apparent difficulties Factors Affecting Communication: Hearing impaired   Cognition Arousal: Alert Behavior During Therapy: WFL for tasks assessed/performed Cognition: History of cognitive impairments             OT - Cognition Comments: Short term memory deficits                 Following commands: Intact       Cueing  General Comments   Cueing Techniques: Verbal cues;Gestural cues      Exercises     Shoulder Instructions      Home Living Family/patient expects to be discharged to:: Assisted living                             Home Equipment: Shower seat;Grab bars - tub/shower;Grab bars - toilet   Additional Comments: Memory Care      Prior Functioning/Environment Prior Level of Function : Needs assist             Mobility Comments: Patient states completes her own ADL.  Facility assists with medications and meals.  Patient walks to the dinning room      OT Problem List: Decreased strength;Decreased range of motion;Impaired balance (sitting and/or standing);Pain;Decreased cognition   OT Treatment/Interventions: Self-care/ADL training;Therapeutic activities;Patient/family education;Balance training;DME and/or AE instruction      OT Goals(Current goals can be found in the care plan section)   Acute Rehab OT Goals Patient Stated Goal: Return home to ALF OT Goal Formulation: With patient Time For Goal Achievement: 11/17/23 Potential to Achieve Goals: Good ADL Goals Pt Will Perform Lower Body Dressing: with contact guard assist;sitting/lateral leans Pt Will Transfer to Toilet: with min assist;bedside commode;stand pivot transfer   OT Frequency:  Min 2X/week    Co-evaluation PT/OT/SLP Co-Evaluation/Treatment: Yes Reason for Co-Treatment: Complexity of the patient's impairments (multi-system  involvement)   OT goals addressed during session: ADL's and self-care      AM-PAC OT "6 Clicks" Daily Activity     Outcome Measure Help from another person eating meals?: None Help from another person taking care of personal grooming?: A Little Help from another person toileting, which includes using toliet, bedpan, or urinal?: A Lot Help from another person bathing (including washing, rinsing, drying)?: A Lot Help from another person to put on and taking off regular upper body clothing?: A Little Help from another person to put on and taking off regular lower body clothing?: A Lot 6 Click Score: 16   End of Session Equipment Utilized During Treatment: Gait belt Nurse Communication: Mobility status  Activity Tolerance: Patient tolerated treatment well Patient left: in chair;with call bell/phone within reach;with chair alarm set;with family/visitor present  OT Visit Diagnosis: Unsteadiness on feet (R26.81);Muscle weakness (generalized) (M62.81);Pain Pain - Right/Left: Left Pain - part of body: Leg                Time: 1610-9604 OT Time Calculation (min): 25 min Charges:  OT General Charges $OT Visit: 1 Visit OT Evaluation $OT  Eval Moderate Complexity: 1 Mod  11/03/2023  RP, OTR/L  Acute Rehabilitation Services  Office:  [phone]   Suzanna Obey 11/03/2023, 12:08 PM

## 2023-11-04 DIAGNOSIS — S32402A Unspecified fracture of left acetabulum, initial encounter for closed fracture: Secondary | ICD-10-CM | POA: Diagnosis not present

## 2023-11-04 DIAGNOSIS — D649 Anemia, unspecified: Secondary | ICD-10-CM | POA: Diagnosis not present

## 2023-11-04 DIAGNOSIS — F03B0 Unspecified dementia, moderate, without behavioral disturbance, psychotic disturbance, mood disturbance, and anxiety: Secondary | ICD-10-CM | POA: Diagnosis not present

## 2023-11-04 DIAGNOSIS — N1832 Chronic kidney disease, stage 3b: Secondary | ICD-10-CM | POA: Diagnosis not present

## 2023-11-04 DIAGNOSIS — I482 Chronic atrial fibrillation, unspecified: Secondary | ICD-10-CM | POA: Diagnosis not present

## 2023-11-04 LAB — URINALYSIS, ROUTINE W REFLEX MICROSCOPIC
Bilirubin Urine: NEGATIVE
Glucose, UA: NEGATIVE mg/dL
Hgb urine dipstick: NEGATIVE
Ketones, ur: NEGATIVE mg/dL
Nitrite: NEGATIVE
Protein, ur: 30 mg/dL — AB
Specific Gravity, Urine: 1.019 (ref 1.005–1.030)
pH: 5 (ref 5.0–8.0)

## 2023-11-04 LAB — CBC
HCT: 29 % — ABNORMAL LOW (ref 36.0–46.0)
Hemoglobin: 9.8 g/dL — ABNORMAL LOW (ref 12.0–15.0)
MCH: 30.9 pg (ref 26.0–34.0)
MCHC: 33.8 g/dL (ref 30.0–36.0)
MCV: 91.5 fL (ref 80.0–100.0)
Platelets: 142 10*3/uL — ABNORMAL LOW (ref 150–400)
RBC: 3.17 MIL/uL — ABNORMAL LOW (ref 3.87–5.11)
RDW: 13.7 % (ref 11.5–15.5)
WBC: 12.2 10*3/uL — ABNORMAL HIGH (ref 4.0–10.5)
nRBC: 0 % (ref 0.0–0.2)

## 2023-11-04 LAB — BASIC METABOLIC PANEL WITH GFR
Anion gap: 6 (ref 5–15)
BUN: 25 mg/dL — ABNORMAL HIGH (ref 8–23)
CO2: 21 mmol/L — ABNORMAL LOW (ref 22–32)
Calcium: 8.3 mg/dL — ABNORMAL LOW (ref 8.9–10.3)
Chloride: 105 mmol/L (ref 98–111)
Creatinine, Ser: 1.1 mg/dL — ABNORMAL HIGH (ref 0.44–1.00)
GFR, Estimated: 48 mL/min — ABNORMAL LOW (ref >60)
Glucose, Bld: 112 mg/dL — ABNORMAL HIGH (ref 70–99)
Potassium: 4.3 mmol/L (ref 3.5–5.1)
Sodium: 132 mmol/L — ABNORMAL LOW (ref 135–145)

## 2023-11-04 MED ORDER — RIVAROXABAN 15 MG PO TABS
15.0000 mg | ORAL_TABLET | Freq: Every day | ORAL | Status: DC
  Administered 2023-11-04: 15 mg via ORAL
  Filled 2023-11-04: qty 1

## 2023-11-04 MED ORDER — ACETAMINOPHEN 325 MG PO TABS
650.0000 mg | ORAL_TABLET | Freq: Three times a day (TID) | ORAL | Status: DC
Start: 2023-11-04 — End: 2023-11-05
  Administered 2023-11-04 – 2023-11-05 (×4): 650 mg via ORAL
  Filled 2023-11-04 (×4): qty 2

## 2023-11-04 MED ORDER — CEPHALEXIN 500 MG PO CAPS
500.0000 mg | ORAL_CAPSULE | Freq: Three times a day (TID) | ORAL | Status: DC
  Administered 2023-11-04 – 2023-11-05 (×4): 500 mg via ORAL
  Filled 2023-11-04 (×5): qty 1

## 2023-11-04 NOTE — Plan of Care (Signed)
  Problem: Clinical Measurements: Goal: Respiratory complications will improve Outcome: Progressing Goal: Cardiovascular complication will be avoided Outcome: Progressing   Problem: Elimination: Goal: Will not experience complications related to urinary retention Outcome: Progressing   Problem: Pain Managment: Goal: General experience of comfort will improve and/or be controlled Outcome: Progressing   Problem: Safety: Goal: Ability to remain free from injury will improve Outcome: Progressing

## 2023-11-04 NOTE — Progress Notes (Signed)
 Orthopaedic Trauma Service Progress Note  Patient ID: [name] MRN: [id number] DOB/AGE: May 07, 1931 89 y.o.  Subjective:  No acute issues Xrays stable  Participated with therapy   Pain controlled  ROS As above  Objective:   VITALS:   Vitals:   11/04/23 0639 11/04/23 0720 11/04/23 0735 11/04/23 1146  BP:   118/67 117/68  Pulse: (!) 102 (!) 106    Resp:   18 18  Temp:   98.7 F (37.1 C) 98.4 F (36.9 C)  TempSrc:   Oral Oral  SpO2: 97% 96%    Weight:      Height:        Estimated body mass index is 23.82 kg/m as calculated from the following:   Height as of this encounter: 5\' 3"  (1.6 m).   Weight as of this encounter: 61 kg.   Intake/Output      03/31 0701 04/01 0700 04/01 0701 04/02 0700   P.O. 237    Total Intake(mL/kg) 237 (3.9)    Urine (mL/kg/hr) 650 (0.4)    Total Output 650    Net -413           LABS  Results for orders placed or performed during the hospital encounter of 11/01/23 (from the past 24 hours)  Urinalysis, Routine w reflex microscopic -Urine, Clean Catch     Status: Abnormal   Collection Time: 11/04/23  4:35 AM  Result Value Ref Range   Color, Urine YELLOW YELLOW   APPearance HAZY (A) CLEAR   Specific Gravity, Urine 1.019 1.005 - 1.030   pH 5.0 5.0 - 8.0   Glucose, UA NEGATIVE NEGATIVE mg/dL   Hgb urine dipstick NEGATIVE NEGATIVE   Bilirubin Urine NEGATIVE NEGATIVE   Ketones, ur NEGATIVE NEGATIVE mg/dL   Protein, ur 30 (A) NEGATIVE mg/dL   Nitrite NEGATIVE NEGATIVE   Leukocytes,Ua SMALL (A) NEGATIVE   RBC / HPF 0-5 0 - 5 RBC/hpf   WBC, UA 21-50 0 - 5 WBC/hpf   Bacteria, UA FEW (A) NONE SEEN   Squamous Epithelial / HPF 0-5 0 - 5 /HPF  CBC     Status: Abnormal   Collection Time: 11/04/23  5:35 AM  Result Value Ref Range   WBC 12.2 (H) 4.0 - 10.5 K/uL   RBC 3.17 (L) 3.87 - 5.11 MIL/uL   Hemoglobin 9.8 (L) 12.0 - 15.0 g/dL   HCT 78.2 (L)  95.6 - 46.0 %   MCV 91.5 80.0 - 100.0 fL   MCH 30.9 26.0 - 34.0 pg   MCHC 33.8 30.0 - 36.0 g/dL   RDW 21.3 08.6 - 57.8 %   Platelets 142 (L) 150 - 400 K/uL   nRBC 0.0 0.0 - 0.2 %  Basic metabolic panel with GFR     Status: Abnormal   Collection Time: 11/04/23  5:35 AM  Result Value Ref Range   Sodium 132 (L) 135 - 145 mmol/L   Potassium 4.3 3.5 - 5.1 mmol/L   Chloride 105 98 - 111 mmol/L   CO2 21 (L) 22 - 32 mmol/L   Glucose, Bld 112 (H) 70 - 99 mg/dL   BUN 25 (H) 8 - 23 mg/dL   Creatinine, Ser 4.69 (H) 0.44 - 1.00 mg/dL   Calcium 8.3 (L) 8.9 - 10.3 mg/dL   GFR, Estimated 48 (  L) >60 mL/min   Anion gap 6 5 - 15     PHYSICAL EXAM:   Gen: awake, sitting up in bed, NAD, comfortable Ext:       Left Lower extremity              Motor and sensory functions intact             Ext warm              No pain with log roll or axial load        Assessment/Plan:     Principal Problem:   Closed left acetabular fracture (HCC) Active Problems:   Chronic atrial fibrillation with RVR (HCC)   CKD (chronic kidney disease) stage 3, GFR 30-59 ml/min (HCC)   Moderate dementia without behavioral disturbance, psychotic disturbance, mood disturbance, or anxiety (HCC)   History of stroke   Anti-infectives (From admission, onward)    Start     Dose/Rate Route Frequency Ordered Stop   11/04/23 1400  cephALEXin (KEFLEX) capsule 500 mg        500 mg Oral Every 8 hours 11/04/23 1019 11/07/23 8645     .  89 year old female ground-level fall with left anterior, posterior hemitransverse acetabular fracture   -Ground-level fall   -Fragility fracture left acetabulum--> anterior column posterior hemitransverse               Continue with nonoperative management               Therapies                Touchdown weightbearing left leg---> essentially bed to chair                Baseline Judet views are stable, alignment and joint look good    - Medical issues                Per primary    - DVT/PE prophylaxis:               Would hold DOAC for now okay to use Lovenox or subcu heparin   - Metabolic Bone Disease:               Osteopenia               Vitamin D   - Activity:               Therapy evals, touchdown weightbearing left leg   - Impediments to fracture healing:               Osteopenia               Ability to maintain weightbearing restrictions   - Dispo:               Therapy evals               Continue with nonoperative management left acetabulum for now     Mearl Latin, PA-C [phone] (C) 11/04/2023, 12:01 PM  [facility] [address] [place] [address] [phone] Collier Bullock (F)    After 5pm and on the weekends please log on to Amion, go to orthopaedics and the look under the [facility] Call for the provider(s) on call. You can also call our office at [phone] and then follow the prompts to be connected to the call team.  Patient ID: [name], female   DOB: 1930/12/27, 89  y.o.   MRN: [id number]

## 2023-11-04 NOTE — TOC Progression Note (Addendum)
 Transition of Care [facility]) - Progression Note    Patient Details  Name: [name] MRN: [id number] Date of Birth: Mar 20, 1931  Transition of Care [facility]) CM/SW Contact  Delilah Shan, LCSWA Phone Number: 11/04/2023, 1:25 PM  Clinical Narrative:     CSW spoke with patient and patients cousin-n-law/friend Purnell Shoemaker at bedside. CSW provided Medicare compare SNF rating list with accepted bed offers to review. Purnell Shoemaker request for CSW to contact [facility] to see if they can offer SNF bed. CS[facility] spoke with [facility] Cruise at [facility] who informed CSW no beds available until next week. CSW informed patients friend Purnell Shoemaker.Patients friend Purnell Shoemaker request  to review SNF bed offers and will give CSW call back with SNF choice. CSW will continue to follow.  Update- CSW received call back from [facility] who informed CSW that patient accepted SNF bed offer with [facility]. CSW spoke with [facility] with [facility] who confirmed SNF bed offer.  Update- CSW started insurance authorization for patient. Auth ID# [id number]. Patients insurance authorization currently pending.  Expected Discharge Plan: Skilled Nursing Facility Barriers to Discharge: Continued Medical Work up  Expected Discharge Plan and Services In-house Referral: Clinical Social Work                                             Social Determinants of Health (SDOH) Interventions SDOH Screenings   Food Insecurity: No Food Insecurity (11/02/2023)  Housing: Low Risk  (11/02/2023)  Transportation Needs: No Transportation Needs (11/02/2023)  Utilities: Not At Risk (11/02/2023)  Alcohol Screen: Low Risk  (06/21/2022)  Depression (PHQ2-9): Low Risk  (03/18/2023)  Financial Resource Strain: Low Risk  (06/21/2022)  Physical Activity: Sufficiently Active (06/21/2022)  Social Connections: Moderately Integrated (11/02/2023)  Stress: No Stress Concern Present (06/21/2022)  Tobacco Use: Medium Risk (11/01/2023)    Readmission Risk Interventions     No data  to display

## 2023-11-04 NOTE — Progress Notes (Addendum)
 [facility] PROGRESS NOTE   [name] ZOX:[id number] DOB: 1930/08/28 DOA: 11/01/2023  PCP: [facility]  Brief History: 89 y.o. female with medical history significant for dementia, CKD 3B, history of CVA, and chronic atrial fibrillation on Xarelto who presents with severe left hip and pelvic pain after a fall.  She was found to have comminuted fracture of the left acetabulum involving both columns with multiple mildly displaced fragments.  Nondisplaced fracture of the left inferior pubic ramus and right superior pubic ramus was also noted.  Orthopedics was consulted.  Patient was hospitalized for further management.  Consultants: Orthopedics  Procedures: None yet    Subjective/Interval History: Patient denies any pain this morning.  No other complaints offered.   Assessment/Plan:  Left acetabular and pubic rami fractures Seen by orthopedics.  No plans for surgical intervention currently.  Touchdown weightbearing.   PT and OT following.  Seems to have tolerated physical therapy well yesterday. Orthopedics continues to follow. Pain seems to be reasonably well-controlled.  Chronic atrial fibrillation with RVR She required diltiazem infusion in the emergency department for elevated heart rates.  Subsequently she had a drop in her blood pressure and heart rate was better controlled as well.  So diltiazem infusion was discontinued. Patient currently on metoprolol 25 mg every 6 hours as per cardiology recommendations.  Heart rate stable for the most part.  If no surgery is definitely planned then we could consolidate her metoprolol to twice a day.  She was on long-acting Cardizem prior to admission which could also be reinitiated as an alternative.  Hopefully in the next 24 to 48 hours. Continue to monitor on telemetry. Prior to admission she was anticoagulated with Xarelto which is currently on hold in case she needs surgical intervention.  Orthopedics  recommending holding Xarelto until we are absolutely certain that she would not need surgery. Cleared by ortho today to resume Xarelto.  Hypotension Probably due to Cardizem infusion which was discontinued.  Blood pressure has stabilized.  Tolerating metoprolol well.  Normocytic anemia/leukocytosis Drop in hemoglobin is likely multifactorial including dilutional effect as well as bleeding from the fracture.  Monitor daily for now. Elevated WBC likely reactive.  She is noted to be afebrile.  WBC is better. UA is nonspecific but does show small leukocytes, 21-50 WBC and few bacteria.  Patient unable to tell me if she is having any symptoms.  Will give her a 3-day course of cephalexin.  History of stroke Stable.  Continue statin.  Chronic kidney disease stage IIIb Stable.  Monitor urine output.  History of dementia Noted to be on Namenda.  Abnormal chest x-ray Concern raised for left lung nodule.  Will need a 2 view chest x-ray but unable to do currently due to her fractures.  Can be pursued after discharge.  DVT Prophylaxis: SCDs Code Status: Full code Family Communication: No family at bedside Disposition Plan: SNF is being pursued  Status is: Inpatient Remains inpatient appropriate because: Acetabular fracture requiring surgery.     Medications: Scheduled:  atorvastatin  10 mg Oral Daily   feeding supplement  237 mL Oral BID BM   heparin injection (subcutaneous)  5,000 Units Subcutaneous Q8H   memantine  10 mg Oral BID   metoprolol tartrate  25 mg Oral Q6H   multivitamin with minerals  1 tablet Oral Daily   Continuous:   WJX:BJYNWGNF (SUBLIMAZE) injection, methocarbamol, mouth rinse, oxyCODONE, polyethylene glycol, prochlorperazine  Antibiotics: Anti-infectives (From admission, onward)    None  Objective:  Vital Signs  Vitals:   11/04/23 0426 11/04/23 0639 11/04/23 0720 11/04/23 0735  BP: 110/61   118/67  Pulse: (!) 116 (!) 102 (!) 106   Resp:    18   Temp:    98.7 F (37.1 C)  TempSrc:    Oral  SpO2: 96% 97% 96%   Weight:      Height:        Intake/Output Summary (Last 24 hours) at 11/04/2023 1014 Last data filed at 11/04/2023 0300 Gross per 24 hour  Intake 237 ml  Output 650 ml  Net -413 ml   Filed Weights   11/01/23 1651 11/01/23 2249  Weight: 59 kg 61 kg   General appearance: Awake alert.  In no distress Resp: Clear to auscultation bilaterally.  Normal effort Cardio: S1-S2 is normal regular.  No S3-S4.  No rubs murmurs or bruit GI: Abdomen is soft.  Nontender nondistended.  Bowel sounds are present normal.  No masses organomegaly   Lab Results:  Data Reviewed: I have personally reviewed following labs and reports of the imaging studies  CBC: Recent Labs  Lab 11/01/23 1813 11/02/23 1015 11/03/23 0531 11/04/23 0535  WBC 16.6* 12.4* 15.1* 12.2*  NEUTROABS 13.8*  --   --   --   HGB 14.0 12.6 10.8* 9.8*  HCT 44.1 38.4 32.7* 29.0*  MCV 95.9 93.7 94.0 91.5  PLT 253 187 162 142*    Basic Metabolic Panel: Recent Labs  Lab 11/01/23 1813 11/03/23 0531 11/04/23 0535  NA 136 135 132*  K 4.0 4.6 4.3  CL 104 107 105  CO2 23 18* 21*  GLUCOSE 125* 101* 112*  BUN 26* 21 25*  CREATININE 1.18* 1.12* 1.10*  CALCIUM 9.9 8.4* 8.3*  MG  --  2.1  --   PHOS  --  3.1  --     GFR: Estimated Creatinine Clearance: 28.1 mL/min (A) (by C-G formula based on SCr of 1.1 mg/dL (H)).  Coagulation Profile: Recent Labs  Lab 11/01/23 1813  INR 1.4*     Recent Results (from the past 240 hours)  Surgical PCR screen     Status: None   Collection Time: 11/02/23  1:41 AM   Specimen: Nasal Mucosa; Nasal Swab  Result Value Ref Range Status   MRSA, PCR NEGATIVE NEGATIVE Final   Staphylococcus aureus NEGATIVE NEGATIVE Final    Comment: (NOTE) The Xpert SA Assay (FDA approved for NASAL specimens in patients 41 years of age and older), is one component of a comprehensive surveillance program. It is not intended to diagnose  infection nor to guide or monitor treatment. Performed at [facility], [address] [place], [place] [address]       Radiology Studies: DG Pelvis Comp Min 3V Result Date: 11/03/2023 CLINICAL DATA:  Left acetabular fracture EXAM: JUDET PELVIS - 3+ VIEW COMPARISON:  11/01/2023 FINDINGS: Frontal, pelvic inlet, pelvic outlet, and bilateral Judet views of the pelvis and bilateral hips are obtained. The comminuted left acetabular fracture seen on prior imaging is again noted, with mild displacement the fracture fragments unchanged since prior exam. The nondisplaced right superior pubic ramus and left inferior pubic ramus fracture seen on prior CT are not well visualized by x-ray. Sacroiliac joints are unremarkable. Soft tissue swelling overlying the left hip. IMPRESSION: 1. Stable position of the comminuted left acetabular fracture seen on prior imaging. 2. The nondisplaced right superior pubic ramus and left inferior pubic ramus fracture seen on CT are not well  visualized by x-ray. Electronically Signed   By: Sharlet Salina M.D.   On: 11/03/2023 15:10   ECHOCARDIOGRAM COMPLETE Result Date: 11/02/2023    ECHOCARDIOGRAM REPORT   Patient Name:   ZYONNA VARDAMAN Date of Exam: 11/02/2023 Medical Rec #:  [id number]               Height:       63.0 in Accession #:    [id number]              Weight:       134.5 lb Date of Birth:  February 28, 1931               BSA:          1.633 m Patient Age:    89 years                BP:           117/66 mmHg Patient Gender: F                       HR:           86 bpm. Exam Location:  Inpatient Procedure: 2D Echo, Cardiac Doppler and Color Doppler (Both Spectral and Color            Flow Doppler were utilized during procedure). Indications:    Atrial Fibrillation I48.91  History:        Patient has no prior history of Echocardiogram examinations.                 Stroke and CKD, stage 3, Arrythmias:Bradycardia; Risk                 Factors:Dyslipidemia.  Sonographer:     Lucendia Herrlich RCS Referring Phys: [phone] SUBRINA SUNDIL IMPRESSIONS  1. Left ventricular ejection fraction, by estimation, is 60 to 65%. The left ventricle has normal function. The left ventricle has no regional wall motion abnormalities. Left ventricular diastolic function could not be evaluated. There is the interventricular septum is flattened in diastole ('D' shaped left ventricle), consistent with right ventricular volume overload.  2. Right ventricular systolic function is mildly reduced. The right ventricular size is mildly enlarged. There is moderately elevated pulmonary artery systolic pressure.  3. Left atrial size was severely dilated.  4. Right atrial size was severely dilated.  5. The mitral valve is normal in structure. Mild mitral valve regurgitation.  6. The tricuspid valve is abnormal. Tricuspid valve regurgitation is moderate to severe.  7. The aortic valve is tricuspid. Aortic valve regurgitation is trivial. No aortic stenosis is present.  8. The inferior vena cava is dilated in size with >50% respiratory variability, suggesting right atrial pressure of 8 mmHg. FINDINGS  Left Ventricle: Left ventricular ejection fraction, by estimation, is 60 to 65%. The left ventricle has normal function. The left ventricle has no regional wall motion abnormalities. The left ventricular internal cavity size was normal in size. There is  no left ventricular hypertrophy. The interventricular septum is flattened in diastole ('D' shaped left ventricle), consistent with right ventricular volume overload. Left ventricular diastolic function could not be evaluated due to atrial fibrillation. Left ventricular diastolic function could not be evaluated. Right Ventricle: The right ventricular size is mildly enlarged. No increase in right ventricular wall thickness. Right ventricular systolic function is mildly reduced. There is moderately elevated pulmonary artery systolic pressure. The tricuspid regurgitant velocity  is 3.14 m/s, and with an assumed right  atrial pressure of 8 mmHg, the estimated right ventricular systolic pressure is 47.5 mmHg. Left Atrium: Left atrial size was severely dilated. Right Atrium: Right atrial size was severely dilated. Pericardium: There is no evidence of pericardial effusion. Mitral Valve: The mitral valve is normal in structure. Mild mitral annular calcification. Mild mitral valve regurgitation, with centrally-directed jet. Tricuspid Valve: The tricuspid valve is abnormal. Tricuspid valve regurgitation is moderate to severe. Aortic Valve: The aortic valve is tricuspid. Aortic valve regurgitation is trivial. No aortic stenosis is present. Pulmonic Valve: The pulmonic valve was grossly normal. Pulmonic valve regurgitation is not visualized. No evidence of pulmonic stenosis. Aorta: The aortic root and ascending aorta are structurally normal, with no evidence of dilitation. Venous: The inferior vena cava is dilated in size with greater than 50% respiratory variability, suggesting right atrial pressure of 8 mmHg. IAS/Shunts: No atrial level shunt detected by color flow Doppler.  LEFT VENTRICLE PLAX 2D LVIDd:         4.16 cm LVIDs:         2.81 cm LV PW:         1.01 cm LV IVS:        0.91 cm  RIGHT VENTRICLE RV Basal diam:  4.39 cm LEFT ATRIUM         Index       RIGHT ATRIUM           Index LA diam:    4.66 cm 2.85 cm/m  RA Area:     27.06 cm                                 RA Volume:   87.81 ml  53.76 ml/m   AORTA Ao Root diam: 2.74 cm TRICUSPID VALVE TR Peak grad:   39.5 mmHg TR Vmax:        314.42 cm/s Thurmon Fair MD Electronically signed by Thurmon Fair MD Signature Date/Time: 11/02/2023/2:15:30 PM    Final        LOS: 3 days   Osvaldo Shipper  [facility] Pager on [email or web address]  11/04/2023, 10:14 AM

## 2023-11-04 NOTE — Progress Notes (Signed)
 PHARMACY - ANTICOAGULATION CONSULT NOTE  Pharmacy Consult for Xarelto Indication: atrial fibrillation  No Known Allergies  Patient Measurements: Height: 5\' 3"  (160 cm) Weight: 61 kg (134 lb 7.7 oz) IBW/kg (Calculated) : 52.4 HEPARIN DW (KG): 59  Vital Signs: Temp: 98.4 F (36.9 C) (04/01 1146) Temp Source: Oral (04/01 1146) BP: 117/68 (04/01 1146) Pulse Rate: 106 (04/01 0720)  Labs: Recent Labs    11/01/23 1813 11/02/23 1015 11/03/23 0531 11/04/23 0535  HGB 14.0 12.6 10.8* 9.8*  HCT 44.1 38.4 32.7* 29.0*  PLT 253 187 162 142*  LABPROT 17.6*  --   --   --   INR 1.4*  --   --   --   CREATININE 1.18*  --  1.12* 1.10*    Estimated Creatinine Clearance: 28.1 mL/min (A) (by C-G formula based on SCr of 1.1 mg/dL (H)).   Medical History: Past Medical History:  Diagnosis Date   Arthritis    AV malformation of gastrointestinal tract    in cecum she denies history of GIB   Breast mass, right    Cataract    CVA (cerebral vascular accident) (HCC) 2007   Dementia (HCC)    Dizziness    denies this as an ongoing problem   Hyperlipidemia    Microcytic anemia    Osteopenia    Persistent atrial fibrillation (HCC)    s/p PVI 04/2010  CHADS2vasc at least 5   RLS (restless legs syndrome)    Sinus bradycardia     Medications:  Medications Prior to Admission  Medication Sig Dispense Refill Last Dose/Taking   Ascorbic Acid (VITAMIN C) 500 MG CAPS Take 500 mg by mouth daily.   Past Week   atorvastatin (LIPITOR) 10 MG tablet TAKE 1/2 TABLET BY MOUTH ONCE A DAY OR AS INSTRUCTED BY PHYSICIAN (Patient taking differently: Take 10 mg by mouth daily. TAKE 1/2 TABLET BY MOUTH ONCE A DAY OR AS INSTRUCTED BY PHYSICIAN) 45 tablet 1 Past Week   diltiazem (CARDIZEM CD) 180 MG 24 hr capsule TAKE 1 CAPSULE BY MOUTH EVERY DAY 90 capsule 1 Past Week   folic acid (FOLVITE) 800 MCG tablet Take 400 mcg by mouth daily.   Past Week   Magnesium Oxide -Mg Supplement 500 MG TABS Take 1 tablet by mouth  daily.   Past Week   memantine (NAMENDA) 10 MG tablet Take 1 tablet (10 mg total) by mouth 2 (two) times daily. 60 tablet 11 Past Week   Multiple Vitamin (MULTIVITAMIN) capsule Take 1 capsule by mouth daily.     Past Week   Rivaroxaban (XARELTO) 15 MG TABS tablet TAKE 1 TABLET (15 MG TOTAL) BY MOUTH DAILY. 90 tablet 0 Past Week   acetaminophen (TYLENOL) 500 MG tablet Take 1-2 tablets (500-1,000 mg total) by mouth 3 (three) times daily as needed (arthritis pain). (Patient not taking: Reported on 11/01/2023) 30 tablet 0 Not Taking   Scheduled:   acetaminophen  650 mg Oral Q8H   atorvastatin  10 mg Oral Daily   cephALEXin  500 mg Oral Q8H   feeding supplement  237 mL Oral BID BM   memantine  10 mg Oral BID   metoprolol tartrate  25 mg Oral Q6H   multivitamin with minerals  1 tablet Oral Daily   Rivaroxaban  15 mg Oral Q supper    Assessment: 89 yo female s/p fall with fractures. She is on xarelto PTA for history of afib and pharmacy consulted to resume therapy.  -SCr 1.1, CrCl ~  25-30 -hg= 9.8  Goal of Therapy:  Monitor platelets by anticoagulation protocol: Yes   Plan:  -Discontinue heparn sq -restart Xarelto 15mg  po daily with dinner   Harland German, PharmD Clinical Pharmacist **Pharmacist phone directory can now be found on [email or web address] ([email or web address]).  Listed under [facility].

## 2023-11-05 DIAGNOSIS — S32402D Unspecified fracture of left acetabulum, subsequent encounter for fracture with routine healing: Secondary | ICD-10-CM | POA: Diagnosis not present

## 2023-11-05 DIAGNOSIS — Z8673 Personal history of transient ischemic attack (TIA), and cerebral infarction without residual deficits: Secondary | ICD-10-CM | POA: Diagnosis not present

## 2023-11-05 DIAGNOSIS — Z23 Encounter for immunization: Secondary | ICD-10-CM | POA: Diagnosis present

## 2023-11-05 DIAGNOSIS — F39 Unspecified mood [affective] disorder: Secondary | ICD-10-CM | POA: Diagnosis not present

## 2023-11-05 DIAGNOSIS — D649 Anemia, unspecified: Secondary | ICD-10-CM | POA: Diagnosis not present

## 2023-11-05 DIAGNOSIS — I482 Chronic atrial fibrillation, unspecified: Secondary | ICD-10-CM | POA: Diagnosis not present

## 2023-11-05 DIAGNOSIS — R531 Weakness: Secondary | ICD-10-CM | POA: Diagnosis not present

## 2023-11-05 DIAGNOSIS — S32592D Other specified fracture of left pubis, subsequent encounter for fracture with routine healing: Secondary | ICD-10-CM | POA: Diagnosis not present

## 2023-11-05 DIAGNOSIS — R262 Difficulty in walking, not elsewhere classified: Secondary | ICD-10-CM | POA: Diagnosis not present

## 2023-11-05 DIAGNOSIS — S32402G Unspecified fracture of left acetabulum, subsequent encounter for fracture with delayed healing: Secondary | ICD-10-CM | POA: Diagnosis not present

## 2023-11-05 DIAGNOSIS — S32402A Unspecified fracture of left acetabulum, initial encounter for closed fracture: Secondary | ICD-10-CM | POA: Diagnosis not present

## 2023-11-05 DIAGNOSIS — I700 Atherosclerosis of aorta: Secondary | ICD-10-CM | POA: Diagnosis not present

## 2023-11-05 DIAGNOSIS — E43 Unspecified severe protein-calorie malnutrition: Secondary | ICD-10-CM | POA: Diagnosis not present

## 2023-11-05 DIAGNOSIS — F01B0 Vascular dementia, moderate, without behavioral disturbance, psychotic disturbance, mood disturbance, and anxiety: Secondary | ICD-10-CM | POA: Diagnosis not present

## 2023-11-05 DIAGNOSIS — S32511D Fracture of superior rim of right pubis, subsequent encounter for fracture with routine healing: Secondary | ICD-10-CM | POA: Diagnosis not present

## 2023-11-05 DIAGNOSIS — N1832 Chronic kidney disease, stage 3b: Secondary | ICD-10-CM | POA: Diagnosis not present

## 2023-11-05 DIAGNOSIS — E785 Hyperlipidemia, unspecified: Secondary | ICD-10-CM | POA: Diagnosis not present

## 2023-11-05 DIAGNOSIS — N1831 Chronic kidney disease, stage 3a: Secondary | ICD-10-CM | POA: Diagnosis not present

## 2023-11-05 DIAGNOSIS — Z7401 Bed confinement status: Secondary | ICD-10-CM | POA: Diagnosis not present

## 2023-11-05 DIAGNOSIS — F0390 Unspecified dementia without behavioral disturbance: Secondary | ICD-10-CM | POA: Diagnosis not present

## 2023-11-05 DIAGNOSIS — R488 Other symbolic dysfunctions: Secondary | ICD-10-CM | POA: Diagnosis not present

## 2023-11-05 DIAGNOSIS — S32482D Displaced dome fracture of left acetabulum, subsequent encounter for fracture with routine healing: Secondary | ICD-10-CM | POA: Diagnosis not present

## 2023-11-05 DIAGNOSIS — F03B0 Unspecified dementia, moderate, without behavioral disturbance, psychotic disturbance, mood disturbance, and anxiety: Secondary | ICD-10-CM | POA: Diagnosis not present

## 2023-11-05 DIAGNOSIS — D509 Iron deficiency anemia, unspecified: Secondary | ICD-10-CM | POA: Diagnosis not present

## 2023-11-05 DIAGNOSIS — D508 Other iron deficiency anemias: Secondary | ICD-10-CM | POA: Diagnosis not present

## 2023-11-05 LAB — CBC
HCT: 33.1 % — ABNORMAL LOW (ref 36.0–46.0)
Hemoglobin: 11 g/dL — ABNORMAL LOW (ref 12.0–15.0)
MCH: 30.2 pg (ref 26.0–34.0)
MCHC: 33.2 g/dL (ref 30.0–36.0)
MCV: 90.9 fL (ref 80.0–100.0)
Platelets: 162 10*3/uL (ref 150–400)
RBC: 3.64 MIL/uL — ABNORMAL LOW (ref 3.87–5.11)
RDW: 13.6 % (ref 11.5–15.5)
WBC: 13.1 10*3/uL — ABNORMAL HIGH (ref 4.0–10.5)
nRBC: 0 % (ref 0.0–0.2)

## 2023-11-05 LAB — BASIC METABOLIC PANEL WITH GFR
Anion gap: 11 (ref 5–15)
BUN: 27 mg/dL — ABNORMAL HIGH (ref 8–23)
CO2: 20 mmol/L — ABNORMAL LOW (ref 22–32)
Calcium: 8.8 mg/dL — ABNORMAL LOW (ref 8.9–10.3)
Chloride: 100 mmol/L (ref 98–111)
Creatinine, Ser: 1.07 mg/dL — ABNORMAL HIGH (ref 0.44–1.00)
GFR, Estimated: 49 mL/min — ABNORMAL LOW (ref >60)
Glucose, Bld: 105 mg/dL — ABNORMAL HIGH (ref 70–99)
Potassium: 4.1 mmol/L (ref 3.5–5.1)
Sodium: 131 mmol/L — ABNORMAL LOW (ref 135–145)

## 2023-11-05 MED ORDER — CEPHALEXIN 500 MG PO CAPS: 500.0000 mg | ORAL_CAPSULE | Freq: Three times a day (TID) | ORAL | Status: AC

## 2023-11-05 MED ORDER — OXYCODONE HCL 5 MG PO TABS: 2.5000 mg | ORAL_TABLET | ORAL | 0 refills | Status: DC | PRN

## 2023-11-05 MED ORDER — POLYETHYLENE GLYCOL 3350 17 G PO PACK: 17.0000 g | PACK | Freq: Every day | ORAL | Status: DC | PRN

## 2023-11-05 MED ORDER — ACETAMINOPHEN 325 MG PO TABS: 650.0000 mg | ORAL_TABLET | Freq: Three times a day (TID) | ORAL | Status: DC

## 2023-11-05 MED ORDER — METHOCARBAMOL 500 MG PO TABS: 500.0000 mg | ORAL_TABLET | Freq: Four times a day (QID) | ORAL | Status: DC | PRN

## 2023-11-05 MED ORDER — ENSURE ENLIVE PO LIQD: 237.0000 mL | Freq: Two times a day (BID) | ORAL | Status: DC

## 2023-11-05 NOTE — TOC Progression Note (Signed)
 Transition of Care [facility]) - Progression Note    Patient Details  Name: [name] MRN: [id number] Date of Birth: 1931/03/19  [facility] CM/SW Contact  Delilah Shan, LCSWA Phone Number: 11/05/2023, 10:28 AM  Clinical Narrative:     Patient has SNF bed at [facility]. Insurance authorization has been approved. Insurance authorization approved from 4/1-4/3. Plan Auth ID# [id number] Auth ID# [id number]. Kerri with [facility] confirmed patient can dc over today if medically stable. CSW informed MD.CSW will continue to follow.  Expected Discharge Plan: Skilled Nursing Facility Barriers to Discharge: Continued Medical Work up  Expected Discharge Plan and Services In-house Referral: Clinical Social Work                                             Social Determinants of Health (SDOH) Interventions SDOH Screenings   Food Insecurity: No Food Insecurity (11/02/2023)  Housing: Low Risk  (11/02/2023)  Transportation Needs: No Transportation Needs (11/02/2023)  Utilities: Not At Risk (11/02/2023)  Alcohol Screen: Low Risk  (06/21/2022)  Depression (PHQ2-9): Low Risk  (03/18/2023)  Financial Resource Strain: Low Risk  (06/21/2022)  Physical Activity: Sufficiently Active (06/21/2022)  Social Connections: Moderately Integrated (11/02/2023)  Stress: No Stress Concern Present (06/21/2022)  Tobacco Use: Medium Risk (11/01/2023)    Readmission Risk Interventions     No data to display

## 2023-11-05 NOTE — Discharge Summary (Signed)
 Physician Discharge Summary   Patient: [name] MRN: [id number] DOB: Mar 17, 1931  Admit date:     11/01/2023  Discharge date: 11/05/23  Discharge Physician: Marguerita Merles, DO   PCP: [facility]   Recommendations at discharge:   Follow-up with PCP within 1 to 2 weeks and repeat CBC, CMP, mag, Phos within 1 week Follow-up with with Cardiology within 1 to 2 weeks  Follow-up with Orthopedic Surgery within 1-2 weeks Repeat chest x-ray and get a two-view to evaluate pulmonary nodule  Discharge Diagnoses: Principal Problem:   Closed left acetabular fracture (HCC) Active Problems:   Chronic atrial fibrillation with RVR (HCC)   CKD (chronic kidney disease) stage 3, GFR 30-59 ml/min (HCC)   Moderate dementia without behavioral disturbance, psychotic disturbance, mood disturbance, or anxiety (HCC)   History of stroke  Resolved Problems:   * No resolved hospital problems. [facility] Course: The patient is a 89 y.o. female with medical history significant for dementia, CKD 3B, history of CVA, and chronic atrial fibrillation on Xarelto who presents with severe left hip and pelvic pain after a fall.  She was found to have comminuted fracture of the left acetabulum involving both columns with multiple mildly displaced fragments.  Nondisplaced fracture of the left inferior pubic ramus and right superior pubic ramus was also noted.  Orthopedics was consulted and recommending non-surgical intervention  Patient was hospitalized for further management and hospitalization has been complicated by A-fib with RVR.  She was placed on a diltiazem drip but subsequently discontinued given her blood pressure dropped.  She is now back on Xarelto and heart rate has been controlled on metoprolol tartrate scheduled and will convert her back to her home diltiazem.  She is medically stable for discharge and will initiate treatment of antibiotics with cephalexin for 3 days given suspected UTI.   [name] stable for discharge at this time will need to follow-up with PCP, Orthopedic Surgery and Cardiology outpatient setting.  Assessment and Plan:  Left acetabular and pubic rami fractures: Seen by orthopedics.  No plans for surgical intervention currently.  Touchdown weightbearing.   PT and OT following.  Seems to have tolerated physical therapy well yesterday. Orthopedics continues to follow. Pain seems to be reasonably well-controlled. Bowel regimen initiated.    Chronic Atrial fibrillation with RVR: She required diltiazem infusion in the emergency department for elevated heart rates.  Subsequently she had a drop in her blood pressure and heart rate was better controlled as well.  So diltiazem infusion was discontinued. -Patient was on Metoprolol 25 mg every 6 hours as per cardiology recommendations.  Heart rate stable for the most part.  If no surgery is definitely planned then we could consolidate her metoprolol to twice a day but since she was was on long-acting Cardizem prior to admission we will resume at D/C. CTM on telemetry. -Prior to admission she was anticoagulated with Xarelto which is currently on hold in case she needs surgical intervention.  Orthopedics recommending holding Xarelto until we are absolutely certain that she would not need surgery. Cleared by Ortho to resume Xarelto and this was done yesterday   Hypotension: Probably due to Cardizem infusion which was discontinued.  Blood pressure has stabilized.  Tolerating metoprolol well but will change back to Home Diltiazem 180 mg. Continue to Monitor BP per Protocol. Last BP reading was 115/73   Normocytic Anemia:Drop in hemoglobin is likely multifactorial including dilutional effect as well as bleeding from the fracture.  Hgb/Hct Trend: Recent  Labs  Lab 11/01/23 1813 11/02/23 1015 11/03/23 0531 11/04/23 0535 11/05/23 0506  HGB 14.0 12.6 10.8* 9.8* 11.0*  HCT 44.1 38.4 32.7* 29.0* 33.1*  MCV 95.9 93.7 94.0 91.5 90.9   -CTM and Trend and repeat CBC w/in 1 week   Leukocytosis: Elevated WBC likely reactive.  She is noted to be afebrile.  WBC fluctuating and current trend: Recent Labs  Lab 11/01/23 1813 11/02/23 1015 11/03/23 0531 11/04/23 0535 11/05/23 0506  WBC 16.6* 12.4* 15.1* 12.2* 13.1*  -UA is nonspecific but does show small leukocytes, 21-50 WBC and few bacteria.  Patient unable to tell me if she is having any symptoms.  -Will give her a 3-day course of cephalexin. Recommend repeat CBC w/in 1 week at SNF   Hx of CVA: Stable. C/w Atorvastatin 10 mg po Daily .   Chronic Kidney Disease Stage IIIb -BUN/Cr Trend: Recent Labs  Lab 11/01/23 1813 11/03/23 0531 11/04/23 0535 11/05/23 0506  BUN 26* 21 25* 27*  CREATININE 1.18* 1.12* 1.10* 1.07*  -Avoid Nephrotoxic Medications, Contrast Dyes, Hypotension and Dehydration to Ensure Adequate Renal Perfusion and will need to Renally Adjust Meds -Continue to Monitor and Trend Renal Function carefully and repeat CMP in the AM   History of Dementia: C/w Memantine 10 mg po BID.   Abnormal chest x-ray: Concern raised for left lung nodule.  Will need a 2 view chest x-ray but unable to do currently due to her fractures.  Can be pursued after discharge.  Nutrition Documentation    Flowsheet Row ED to Hosp-Admission (Current) from 11/01/2023 in [place] [facility]  Nutrition Problem Increased nutrient needs  Etiology hip fracture  Nutrition Goal Patient will meet greater than or equal to 90% of their needs  Interventions Ensure Enlive (each supplement provides 350kcal and 20 grams of protein), MVI      Consultants: Cardiology, Orthopedic Surgery Procedures performed: As delineated as above  Disposition: Skilled nursing facility Diet recommendation:  Discharge Diet Orders (From admission, onward)     Start     Ordered   11/05/23 0000  Diet - low sodium heart healthy        11/05/23 1321           Cardiac diet DISCHARGE  MEDICATION: Allergies as of 11/05/2023   No Known Allergies      Medication List     TAKE these medications    acetaminophen 325 MG tablet Commonly known as: TYLENOL Take 2 tablets (650 mg total) by mouth every 8 (eight) hours. What changed:  medication strength how much to take when to take this reasons to take this   atorvastatin 10 MG tablet Commonly known as: LIPITOR TAKE 1/2 TABLET BY MOUTH ONCE A DAY OR AS INSTRUCTED BY PHYSICIAN What changed: See the new instructions.   cephALEXin 500 MG capsule Commonly known as: KEFLEX Take 1 capsule (500 mg total) by mouth every 8 (eight) hours for 3 days.   diltiazem 180 MG 24 hr capsule Commonly known as: CARDIZEM CD TAKE 1 CAPSULE BY MOUTH EVERY DAY   feeding supplement Liqd Take 237 mLs by mouth 2 (two) times daily between meals.   folic acid 800 MCG tablet Commonly known as: FOLVITE Take 400 mcg by mouth daily.   Magnesium Oxide -Mg Supplement 500 MG Tabs Take 1 tablet by mouth daily.   memantine 10 MG tablet Commonly known as: Namenda Take 1 tablet (10 mg total) by mouth 2 (two) times daily.   methocarbamol 500 MG  tablet Commonly known as: ROBAXIN Take 1 tablet (500 mg total) by mouth every 6 (six) hours as needed for muscle spasms.   multivitamin capsule Take 1 capsule by mouth daily.   oxyCODONE 5 MG immediate release tablet Commonly known as: Oxy IR/ROXICODONE Take 0.5 tablets (2.5 mg total) by mouth every 4 (four) hours as needed for moderate pain (pain score 4-6).   polyethylene glycol 17 g packet Commonly known as: MIRALAX / GLYCOLAX Take 17 g by mouth daily as needed for mild constipation.   Vitamin C 500 MG Caps Take 500 mg by mouth daily.   Xarelto 15 MG Tabs tablet Generic drug: Rivaroxaban TAKE 1 TABLET (15 MG TOTAL) BY MOUTH DAILY.        Contact information for after-discharge care     Destination     [facility] Preferred SNF .   Service: Skilled Nursing Contact  information: [address] [place] [address] [phone]                    Discharge Exam: Ceasar Mons Weights   11/01/23 1651 11/01/23 2249  Weight: 59 kg 61 kg   Vitals:   11/05/23 1200 11/05/23 1423  BP: 115/73 123/73  Pulse: 96   Resp:  13  Temp:  98.5 F (36.9 C)  SpO2:     Examination: Physical Exam:  Constitutional: Thin Caucasian female in NAD Respiratory: Diminished to auscultation bilaterally, no wheezing, rales, rhonchi or crackles. Normal respiratory effort and patient is not tachypenic. No accessory muscle use. Unlabored breathing  Cardiovascular: RRR, no murmurs / rubs / gallops. S1 and S2 auscultated. No extremity edema. Abdomen: Soft, non-tender, non-distended. Bowel sounds positive.  GU: Deferred. Musculoskeletal: No clubbing / cyanosis of digits/nails. No joint deformity upper and lower extremities.  Skin: No rashes, lesions, ulcers. No induration; Warm and dry.  Neurologic: CN 2-12 grossly intact with no focal deficits. Romberg sign and cerebellar reflexes not assessed.  Psychiatric: Awake and alert  Condition at discharge: stable  The results of significant diagnostics from this hospitalization (including imaging, microbiology, ancillary and laboratory) are listed below for reference.   Imaging Studies: DG Pelvis Comp Min 3V Result Date: 11/03/2023 CLINICAL DATA:  Left acetabular fracture EXAM: JUDET PELVIS - 3+ VIEW COMPARISON:  11/01/2023 FINDINGS: Frontal, pelvic inlet, pelvic outlet, and bilateral Judet views of the pelvis and bilateral hips are obtained. The comminuted left acetabular fracture seen on prior imaging is again noted, with mild displacement the fracture fragments unchanged since prior exam. The nondisplaced right superior pubic ramus and left inferior pubic ramus fracture seen on prior CT are not well visualized by x-ray. Sacroiliac joints are unremarkable. Soft tissue swelling overlying the left hip. IMPRESSION:  1. Stable position of the comminuted left acetabular fracture seen on prior imaging. 2. The nondisplaced right superior pubic ramus and left inferior pubic ramus fracture seen on CT are not well visualized by x-ray. Electronically Signed   By: Sharlet Salina M.D.   On: 11/03/2023 15:10   ECHOCARDIOGRAM COMPLETE Result Date: 11/02/2023    ECHOCARDIOGRAM REPORT   Patient Name:   [name] Date of Exam: 11/02/2023 Medical Rec #:  [id number]               Height:       63.0 in Accession #:    [id number]              Weight:       134.5 lb Date  of Birth:  1931/04/13               BSA:          1.633 m Patient Age:    89 years                BP:           117/66 mmHg Patient Gender: F                       HR:           86 bpm. Exam Location:  Inpatient Procedure: 2D Echo, Cardiac Doppler and Color Doppler (Both Spectral and Color            Flow Doppler were utilized during procedure). Indications:    Atrial Fibrillation I48.91  History:        Patient has no prior history of Echocardiogram examinations.                 Stroke and CKD, stage 3, Arrythmias:Bradycardia; Risk                 Factors:Dyslipidemia.  Sonographer:    Lucendia Herrlich RCS Referring Phys: [phone] SUBRINA SUNDIL IMPRESSIONS  1. Left ventricular ejection fraction, by estimation, is 60 to 65%. The left ventricle has normal function. The left ventricle has no regional wall motion abnormalities. Left ventricular diastolic function could not be evaluated. There is the interventricular septum is flattened in diastole ('D' shaped left ventricle), consistent with right ventricular volume overload.  2. Right ventricular systolic function is mildly reduced. The right ventricular size is mildly enlarged. There is moderately elevated pulmonary artery systolic pressure.  3. Left atrial size was severely dilated.  4. Right atrial size was severely dilated.  5. The mitral valve is normal in structure. Mild mitral valve regurgitation.  6. The  tricuspid valve is abnormal. Tricuspid valve regurgitation is moderate to severe.  7. The aortic valve is tricuspid. Aortic valve regurgitation is trivial. No aortic stenosis is present.  8. The inferior vena cava is dilated in size with >50% respiratory variability, suggesting right atrial pressure of 8 mmHg. FINDINGS  Left Ventricle: Left ventricular ejection fraction, by estimation, is 60 to 65%. The left ventricle has normal function. The left ventricle has no regional wall motion abnormalities. The left ventricular internal cavity size was normal in size. There is  no left ventricular hypertrophy. The interventricular septum is flattened in diastole ('D' shaped left ventricle), consistent with right ventricular volume overload. Left ventricular diastolic function could not be evaluated due to atrial fibrillation. Left ventricular diastolic function could not be evaluated. Right Ventricle: The right ventricular size is mildly enlarged. No increase in right ventricular wall thickness. Right ventricular systolic function is mildly reduced. There is moderately elevated pulmonary artery systolic pressure. The tricuspid regurgitant velocity is 3.14 m/s, and with an assumed right atrial pressure of 8 mmHg, the estimated right ventricular systolic pressure is 47.5 mmHg. Left Atrium: Left atrial size was severely dilated. Right Atrium: Right atrial size was severely dilated. Pericardium: There is no evidence of pericardial effusion. Mitral Valve: The mitral valve is normal in structure. Mild mitral annular calcification. Mild mitral valve regurgitation, with centrally-directed jet. Tricuspid Valve: The tricuspid valve is abnormal. Tricuspid valve regurgitation is moderate to severe. Aortic Valve: The aortic valve is tricuspid. Aortic valve regurgitation is trivial. No aortic stenosis is present. Pulmonic Valve: The pulmonic valve was grossly normal. Pulmonic valve regurgitation  is not visualized. No evidence of pulmonic  stenosis. Aorta: The aortic root and ascending aorta are structurally normal, with no evidence of dilitation. Venous: The inferior vena cava is dilated in size with greater than 50% respiratory variability, suggesting right atrial pressure of 8 mmHg. IAS/Shunts: No atrial level shunt detected by color flow Doppler.  LEFT VENTRICLE PLAX 2D LVIDd:         4.16 cm LVIDs:         2.81 cm LV PW:         1.01 cm LV IVS:        0.91 cm  RIGHT VENTRICLE RV Basal diam:  4.39 cm LEFT ATRIUM         Index       RIGHT ATRIUM           Index LA diam:    4.66 cm 2.85 cm/m  RA Area:     27.06 cm                                 RA Volume:   87.81 ml  53.76 ml/m   AORTA Ao Root diam: 2.74 cm TRICUSPID VALVE TR Peak grad:   39.5 mmHg TR Vmax:        314.42 cm/s Thurmon Fair MD Electronically signed by Thurmon Fair MD Signature Date/Time: 11/02/2023/2:15:30 PM    Final    CT PELVIS WO CONTRAST Result Date: 11/01/2023 CLINICAL DATA:  Hip trauma, fracture seen on prior x-ray. Pain with movement. EXAM: CT PELVIS WITHOUT CONTRAST TECHNIQUE: Multidetector CT imaging of the pelvis was performed following the standard protocol without intravenous contrast. RADIATION DOSE REDUCTION: This exam was performed according to the departmental dose-optimization program which includes automated exposure control, adjustment of the mA and/or kV according to patient size and/or use of iterative reconstruction technique. COMPARISON:  Same day hip x-ray. FINDINGS: Urinary Tract:  No abnormality visualized. Bowel:  Unremarkable visualized pelvic bowel loops. Vascular/Lymphatic: No pathologically enlarged lymph nodes. Aortic atherosclerotic calcification. Reproductive:  No mass or other significant abnormality Other:  Small hematoma along the left pelvic sidewall. Musculoskeletal: Comminuted fracture of the left acetabulum with multiple mildly displaced fragments. Fracture lines extend superiorly into the iliac wing, medially through the medial  acetabulum and through the posterior acetabulum. Anterior fracture lines extend into the base of the superior pubic ramus. Nondisplaced fracture of the left inferior pubic ramus near the pubic body (series 7/image 111) and buckle fracture of the left mid inferior pubic ramus. Buckle fracture of the right superior pubic ramus near the pubic body. Thickening of the left iliacus muscle compatible with hematoma IMPRESSION: 1. Comminuted fracture of the left acetabulum involving both columns with multiple mildly displaced fragments. 2. Nondisplaced fracture of the left inferior pubic ramus near the pubic body and buckle fracture of the left mid inferior pubic ramus. 3. Buckle fracture of the right superior pubic ramus near the pubic body. 4. Small hematoma along the left pelvic sidewall. Intramuscular hematoma in the left iliacus. Electronically Signed   By: Minerva Fester M.D.   On: 11/01/2023 19:22   CT Head Wo Contrast Result Date: 11/01/2023 CLINICAL DATA:  Head trauma, minor (Age >= 65y).  Witnessed fall. EXAM: CT HEAD WITHOUT CONTRAST TECHNIQUE: Contiguous axial images were obtained from the base of the skull through the vertex without intravenous contrast. RADIATION DOSE REDUCTION: This exam was performed according to the departmental dose-optimization program which  includes automated exposure control, adjustment of the mA and/or kV according to patient size and/or use of iterative reconstruction technique. COMPARISON:  Head MRI 05/13/2013 FINDINGS: Brain: There is no evidence of an acute infarct, intracranial hemorrhage, mass, midline shift, or extra-axial fluid collection. There is mild cerebral atrophy. Cerebral white matter hypodensities are nonspecific but compatible with mild chronic small vessel ischemic disease. There is an unchanged small chronic infarct involving the left insula and underlying white matter. Vascular: Calcified atherosclerosis at the skull base. No hyperdense vessel. Skull: No acute  fracture or suspicious lesion. Sinuses/Orbits: Visualized paranasal sinuses and mastoid air cells are clear. Bilateral cataract extraction. Other: Possible small left frontal scalp contusion. IMPRESSION: 1. No evidence of acute intracranial abnormality. 2. Mild chronic small vessel ischemic disease and cerebral atrophy. Electronically Signed   By: Sebastian Ache M.D.   On: 11/01/2023 19:06   DG Chest 1 View Result Date: 11/01/2023 CLINICAL DATA:  Fall.  Left hip injury. EXAM: CHEST  1 VIEW COMPARISON:  Chest radiographs 10/14/2018 and 08/08/2016. FINDINGS: 1733 hours. The heart appears mildly enlarged but stable. There is aortic atherosclerosis. There is a new nodular density projecting over the lateral aspect of the left 8th rib which could relate to a healed fracture or nipple shadow. A peripheral subpleural pulmonary nodule is considered less likely. The lungs are otherwise clear. There is no pleural effusion or pneumothorax. No acute fractures are identified in the chest. IMPRESSION: 1. No evidence of acute chest injury. 2. New nodular density projecting over the lateral aspect of the left 8th rib, possibly a healed fracture or nipple shadow. Recommend two view chest radiographs when the patient is able to exclude a pulmonary nodule. Electronically Signed   By: Carey Bullocks M.D.   On: 11/01/2023 17:55   DG Hip Unilat With Pelvis 2-3 Views Left Result Date: 11/01/2023 CLINICAL DATA:  Hip pain [facility] at senior living center. EXAM: DG HIP (WITH OR WITHOUT PELVIS) 2-3V LEFT COMPARISON:  None Available. FINDINGS: AP pelvis with AP and cross-table lateral views of the left hip. The bones appear adequately mineralized. There is a comminuted fracture of the left acetabulum which extends into the left iliac bone. The left superior pubic ramus appears disrupted. There is resulting superior displacement of the left femoral head. No evidence of proximal femur fracture or dislocation. The sacroiliac joints  appear intact. Iliofemoral atherosclerosis noted. IMPRESSION: Comminuted fracture of the left acetabulum extending into the left iliac bone with disruption of the left superior pubic ramus and superior displacement of the left femoral head. Recommend further evaluation with pelvic CT. Electronically Signed   By: Carey Bullocks M.D.   On: 11/01/2023 17:51   Microbiology: Results for orders placed or performed during the hospital encounter of 11/01/23  Surgical PCR screen     Status: None   Collection Time: 11/02/23  1:41 AM   Specimen: Nasal Mucosa; Nasal Swab  Result Value Ref Range Status   MRSA, PCR NEGATIVE NEGATIVE Final   Staphylococcus aureus NEGATIVE NEGATIVE Final    Comment: (NOTE) The Xpert SA Assay (FDA approved for NASAL specimens in patients 106 years of age and older), is one component of a comprehensive surveillance program. It is not intended to diagnose infection nor to guide or monitor treatment. Performed at [facility], [address] [place], [place] [address]    Labs: CBC: Recent Labs  Lab 11/01/23 1813 11/02/23 1015 11/03/23 0531 11/04/23 0535 11/05/23 0506  WBC 16.6* 12.4* 15.1* 12.2*  13.1*  NEUTROABS 13.8*  --   --   --   --   HGB 14.0 12.6 10.8* 9.8* 11.0*  HCT 44.1 38.4 32.7* 29.0* 33.1*  MCV 95.9 93.7 94.0 91.5 90.9  PLT 253 187 162 142* 162   Basic Metabolic Panel: Recent Labs  Lab 11/01/23 1813 11/03/23 0531 11/04/23 0535 11/05/23 0506  NA 136 135 132* 131*  K 4.0 4.6 4.3 4.1  CL 104 107 105 100  CO2 23 18* 21* 20*  GLUCOSE 125* 101* 112* 105*  BUN 26* 21 25* 27*  CREATININE 1.18* 1.12* 1.10* 1.07*  CALCIUM 9.9 8.4* 8.3* 8.8*  MG  --  2.1  --   --   PHOS  --  3.1  --   --    Liver Function Tests: No results for input(s): "AST", "ALT", "ALKPHOS", "BILITOT", "PROT", "ALBUMIN" in the last 168 hours. CBG: No results for input(s): "GLUCAP" in the last 168 hours.  Discharge time spent: greater than 30 minutes.  Signed: Marguerita Merles, DO [facility] 11/05/2023

## 2023-11-05 NOTE — TOC Transition Note (Signed)
 Transition of Care [facility]) - Discharge Note   Patient Details  Name: [name] MRN: [id number] Date of Birth: February 13, 1931  [facility] CM/SW Contact:  Delilah Shan, LCSWA Phone Number: 11/05/2023, 1:49 PM   Clinical Narrative:     Patient will DC to: [facility] SNF  Anticipated DC date: 11/05/2023  Family notified: Purnell Shoemaker  Transport by: Sharin Mons  ?  Per MD patient ready for DC to [facility]. RN, patient, patient's family, and facility notified of DC. Discharge Summary sent to facility. RN given number for report [phone] RM# [phone]. DC packet on chart. Ambulance transport requested for patient.  CSW signing off.   Final next level of care: Skilled Nursing Facility Barriers to Discharge: No Barriers Identified   Patient Goals and CMS Choice Patient states their goals for this hospitalization and ongoing recovery are:: SNF   Choice offered to / list presented to :  (Patients friend/Kaye)      Discharge Placement              Patient chooses bed at: [facility] Patient to be transferred to facility by: [facility] Name of family member notified: Purnell Shoemaker Patient and family notified of of transfer: 11/05/23  Discharge Plan and Services Additional resources added to the After Visit Summary for   In-house Referral: Clinical Social Work                                   Social Drivers of Health (SDOH) Interventions SDOH Screenings   Food Insecurity: No Food Insecurity (11/02/2023)  Housing: Low Risk  (11/02/2023)  Transportation Needs: No Transportation Needs (11/02/2023)  Utilities: Not At Risk (11/02/2023)  Alcohol Screen: Low Risk  (06/21/2022)  Depression (PHQ2-9): Low Risk  (03/18/2023)  Financial Resource Strain: Low Risk  (06/21/2022)  Physical Activity: Sufficiently Active (06/21/2022)  Social Connections: Moderately Integrated (11/02/2023)  Stress: No Stress Concern Present (06/21/2022)  Tobacco Use: Medium Risk (11/01/2023)      Readmission Risk Interventions     No data to display

## 2023-11-05 NOTE — Care Management Important Message (Signed)
 Important Message  Patient Details  Name: [name] MRN: [id number] Date of Birth: 1931/04/18   Important Message Given:  Yes - Medicare IM     Renie Ora 11/05/2023, 11:47 AM

## 2023-11-05 NOTE — Progress Notes (Signed)
 I called the phone provided by the Social worker three time to give report on this pt but no one picked the call. I call will back again in a little bit.

## 2023-11-05 NOTE — Hospital Course (Addendum)
 The patient is a 89 y.o. female with medical history significant for dementia, CKD 3B, history of CVA, and chronic atrial fibrillation on Xarelto who presents with severe left hip and pelvic pain after a fall.  She was found to have comminuted fracture of the left acetabulum involving both columns with multiple mildly displaced fragments.  Nondisplaced fracture of the left inferior pubic ramus and right superior pubic ramus was also noted.  Orthopedics was consulted and recommending non-surgical intervention  Patient was hospitalized for further management and hospitalization has been complicated by A-fib with RVR.  She was placed on a diltiazem drip but subsequently discontinued given her blood pressure dropped.  She is now back on Xarelto and heart rate has been controlled on metoprolol tartrate scheduled and will convert her back to her home diltiazem.  She is medically stable for discharge and will initiate treatment of antibiotics with cephalexin for 3 days given suspected UTI.  [name] stable for discharge at this time will need to follow-up with PCP, Orthopedic Surgery and Cardiology outpatient setting.  Assessment and Plan:  Left acetabular and pubic rami fractures: Seen by orthopedics.  No plans for surgical intervention currently.  Touchdown weightbearing.   PT and OT following.  Seems to have tolerated physical therapy well yesterday. Orthopedics continues to follow. Pain seems to be reasonably well-controlled. Bowel regimen initiated.    Chronic Atrial fibrillation with RVR: She required diltiazem infusion in the emergency department for elevated heart rates.  Subsequently she had a drop in her blood pressure and heart rate was better controlled as well.  So diltiazem infusion was discontinued. -Patient was on Metoprolol 25 mg every 6 hours as per cardiology recommendations.  Heart rate stable for the most part.  If no surgery is definitely planned then we could consolidate her metoprolol to  twice a day but since she was was on long-acting Cardizem prior to admission we will resume at D/C. CTM on telemetry. -Prior to admission she was anticoagulated with Xarelto which is currently on hold in case she needs surgical intervention.  Orthopedics recommending holding Xarelto until we are absolutely certain that she would not need surgery. Cleared by Ortho to resume Xarelto and this was done yesterday   Hypotension: Probably due to Cardizem infusion which was discontinued.  Blood pressure has stabilized.  Tolerating metoprolol well but will change back to Home Diltiazem 180 mg. Continue to Monitor BP per Protocol. Last BP reading was 115/73   Normocytic Anemia:Drop in hemoglobin is likely multifactorial including dilutional effect as well as bleeding from the fracture.  Hgb/Hct Trend: Recent Labs  Lab 11/01/23 1813 11/02/23 1015 11/03/23 0531 11/04/23 0535 11/05/23 0506  HGB 14.0 12.6 10.8* 9.8* 11.0*  HCT 44.1 38.4 32.7* 29.0* 33.1*  MCV 95.9 93.7 94.0 91.5 90.9  -CTM and Trend and repeat CBC w/in 1 week   Leukocytosis: Elevated WBC likely reactive.  She is noted to be afebrile.  WBC fluctuating and current trend: Recent Labs  Lab 11/01/23 1813 11/02/23 1015 11/03/23 0531 11/04/23 0535 11/05/23 0506  WBC 16.6* 12.4* 15.1* 12.2* 13.1*  -UA is nonspecific but does show small leukocytes, 21-50 WBC and few bacteria.  Patient unable to tell me if she is having any symptoms.  -Will give her a 3-day course of cephalexin. Recommend repeat CBC w/in 1 week at SNF   Hx of CVA: Stable. C/w Atorvastatin 10 mg po Daily .   Chronic Kidney Disease Stage IIIb -BUN/Cr Trend: Recent Labs  Lab 11/01/23 1813 11/03/23  7829 11/04/23 0535 11/05/23 0506  BUN 26* 21 25* 27*  CREATININE 1.18* 1.12* 1.10* 1.07*  -Avoid Nephrotoxic Medications, Contrast Dyes, Hypotension and Dehydration to Ensure Adequate Renal Perfusion and will need to Renally Adjust Meds -Continue to Monitor and Trend Renal  Function carefully and repeat CMP in the AM   History of Dementia: C/w Memantine 10 mg po BID.   Abnormal chest x-ray: Concern raised for left lung nodule.  Will need a 2 view chest x-ray but unable to do currently due to her fractures.  Can be pursued after discharge.

## 2023-11-06 ENCOUNTER — Non-Acute Institutional Stay (SKILLED_NURSING_FACILITY): Payer: Self-pay | Admitting: Adult Health

## 2023-11-06 ENCOUNTER — Encounter: Payer: Self-pay | Admitting: Adult Health

## 2023-11-06 DIAGNOSIS — Z8673 Personal history of transient ischemic attack (TIA), and cerebral infarction without residual deficits: Secondary | ICD-10-CM

## 2023-11-06 DIAGNOSIS — D508 Other iron deficiency anemias: Secondary | ICD-10-CM

## 2023-11-06 DIAGNOSIS — N1832 Chronic kidney disease, stage 3b: Secondary | ICD-10-CM

## 2023-11-06 DIAGNOSIS — I700 Atherosclerosis of aorta: Secondary | ICD-10-CM

## 2023-11-06 DIAGNOSIS — F01B0 Vascular dementia, moderate, without behavioral disturbance, psychotic disturbance, mood disturbance, and anxiety: Secondary | ICD-10-CM

## 2023-11-06 DIAGNOSIS — I482 Chronic atrial fibrillation, unspecified: Secondary | ICD-10-CM | POA: Diagnosis not present

## 2023-11-06 DIAGNOSIS — S32402G Unspecified fracture of left acetabulum, subsequent encounter for fracture with delayed healing: Secondary | ICD-10-CM | POA: Diagnosis not present

## 2023-11-06 NOTE — Progress Notes (Signed)
 Location:  [facility] Room Number: 143 Place of Service:  SNF ([id number])   CODE STATUS: dnr   No Known Allergies  Chief Complaint  Patient presents with   Hospitalization Follow-up    HPI:  She is a 89 year old woman who has been hospitalized from 11-01-23 through 11-05-23. Her past medical history includes: dementia; ckd stage 3b; cva; chronic atrial fibrillation on xarelto. She presented to the ED after a fall with left hip and pelvic pain. She was found to have a comminuted fracture of left acetabulum involving both columns with multiple mildly displaced fragments; nondisplaced fracture of left inferior pubic ramus and right superior pubic ramus was also present. Orthopedics was consulted and recommended non surgical treatment.  Her hospitalization was complicated by atrial fibrillation with rvr. She was initially placed on a cardizem drip; was stopped due to hypotension. She is presently back on xarelto.  She is here for short term rehab with her goal to return back home. She will continue to be followed for her chronic illnesses including: Stage 3b chronic kidney disease: History of stroke: Anemia iron deficiency microcytic   Past Medical History:  Diagnosis Date   Arthritis    AV malformation of gastrointestinal tract    in cecum she denies history of GIB   Breast mass, right    Cataract    CVA (cerebral vascular accident) (HCC) 2007   Dementia (HCC)    Dizziness    denies this as an ongoing problem   Hyperlipidemia    Microcytic anemia    Osteopenia    Persistent atrial fibrillation (HCC)    s/p PVI 04/2010  CHADS2vasc at least 5   RLS (restless legs syndrome)    Sinus bradycardia     Past Surgical History:  Procedure Laterality Date   afib ablation  04/2010   PVI with CTI ablation performed by [facility]   BREAST EXCISIONAL BIOPSY Right    benign cyst   breast mass resected     CARDIOVERSION     for afib   CATARACT EXTRACTION W/PHACO  11/11/2011    Procedure: CATARACT EXTRACTION PHACO AND INTRAOCULAR LENS PLACEMENT (IOC);  Surgeon: Gemma Payor, MD;  Location: [facility];  Service: Ophthalmology;  Laterality: Left;  CDE:12.62   CATARACT EXTRACTION W/PHACO  11/25/2011   Procedure: CATARACT EXTRACTION PHACO AND INTRAOCULAR LENS PLACEMENT (IOC);  Surgeon: Gemma Payor, MD;  Location: [facility];  Service: Ophthalmology;  Laterality: Right;  CDE 16.14   dental inplants      Social History   Socioeconomic History   Marital status: Widowed    Spouse name: Not on file   Number of children: 0   Years of education: college   Highest education level: Not on file  Occupational History   Occupation: retired  Tobacco Use   Smoking status: Former    Current packs/day: 0.00    Average packs/day: 1 pack/day for 30.0 years (30.0 ttl pk-yrs)    Types: Cigarettes    Start date: 08/05/1948    Quit date: 08/05/1978    Years since quitting: 45.2   Smokeless tobacco: Never   Tobacco comments:    quit in 1980s  Vaping Use   Vaping status: Never Used  Substance and Sexual Activity   Alcohol use: No   Drug use: No   Sexual activity: Never  Other Topics Concern   Not on file  Social History Narrative   Patient is retired and lives at [facility]  Cousin Cleatrice Burke and his wife Purnell Shoemaker are her closest living relatives.  They are who will make medical decisions for her if she is unable.   Patient has business college education. Right handed.    Caffeine- some times - soda    Daily exercise at [facility] - walks a mile a day at home.    Church every Sunday   Likes to go on trips with the rec center   Wears hearing aids/ dentures   Social Drivers of Health   Financial Resource Strain: Low Risk  (06/21/2022)   Overall Financial Resource Strain (CARDIA)    Difficulty of Paying Living Expenses: Not hard at all  Food Insecurity: No Food Insecurity (11/02/2023)   Hunger Vital Sign    Worried About Running Out of Food in the Last Year: Never true    Ran Out of  Food in the Last Year: Never true  Transportation Needs: No Transportation Needs (11/02/2023)   PRAPARE - Administrator, Civil Service (Medical): No    Lack of Transportation (Non-Medical): No  Physical Activity: Sufficiently Active (06/21/2022)   Exercise Vital Sign    Days of Exercise per Week: 5 days    Minutes of Exercise per Session: 60 min  Stress: No Stress Concern Present (06/21/2022)   [employer] of Occupational Health - Occupational Stress Questionnaire    Feeling of Stress : Not at all  Social Connections: Moderately Integrated (11/02/2023)   Social Connection and Isolation Panel [NHANES]    Frequency of Communication with Friends and Family: Three times a week    Frequency of Social Gatherings with Friends and Family: Three times a week    Attends Religious Services: More than 4 times per year    Active Member of Clubs or Organizations: Yes    Attends Banker Meetings: More than 4 times per year    Marital Status: Widowed  Intimate Partner Violence: Not At Risk (11/02/2023)   Humiliation, Afraid, Rape, and Kick questionnaire    Fear of Current or Ex-Partner: No    Emotionally Abused: No    Physically Abused: No    Sexually Abused: No   Family History  Problem Relation Age of Onset   Stroke Mother 72   Hip fracture Mother    Lung cancer Father 60   Anesthesia problems Neg Hx    Hypotension Neg Hx    Malignant hyperthermia Neg Hx    Pseudochol deficiency Neg Hx    Breast cancer Neg Hx       VITAL SIGNS BP 111/66   Pulse 89   Temp 97.8 F (36.6 C)   Resp 20   Ht 5' 3.5" (1.613 m)   Wt 142 lb 3.2 oz (64.5 kg)   SpO2 98%   BMI 24.79 kg/m   Outpatient Encounter Medications as of 11/06/2023  Medication Sig   acetaminophen (TYLENOL) 325 MG tablet Take 2 tablets (650 mg total) by mouth every 8 (eight) hours.   Ascorbic Acid (VITAMIN C) 500 MG CAPS Take 500 mg by mouth daily.   atorvastatin (LIPITOR) 10 MG tablet TAKE 1/2 TABLET BY  MOUTH ONCE A DAY OR AS INSTRUCTED BY PHYSICIAN (Patient taking differently: Take 10 mg by mouth daily. TAKE 1/2 TABLET BY MOUTH ONCE A DAY OR AS INSTRUCTED BY PHYSICIAN)   cephALEXin (KEFLEX) 500 MG capsule Take 1 capsule (500 mg total) by mouth every 8 (eight) hours for 3 days.   diltiazem (CARDIZEM CD) 180 MG 24  hr capsule TAKE 1 CAPSULE BY MOUTH EVERY DAY   feeding supplement (ENSURE ENLIVE / ENSURE PLUS) LIQD Take 237 mLs by mouth 2 (two) times daily between meals.   folic acid (FOLVITE) 800 MCG tablet Take 400 mcg by mouth daily.   Magnesium Oxide -Mg Supplement 500 MG TABS Take 1 tablet by mouth daily.   memantine (NAMENDA) 10 MG tablet Take 1 tablet (10 mg total) by mouth 2 (two) times daily.   methocarbamol (ROBAXIN) 500 MG tablet Take 1 tablet (500 mg total) by mouth every 6 (six) hours as needed for muscle spasms.   Multiple Vitamin (MULTIVITAMIN) capsule Take 1 capsule by mouth daily.     oxyCODONE (OXY IR/ROXICODONE) 5 MG immediate release tablet Take 0.5 tablets (2.5 mg total) by mouth every 4 (four) hours as needed for moderate pain (pain score 4-6).   polyethylene glycol (MIRALAX / GLYCOLAX) 17 g packet Take 17 g by mouth daily as needed for mild constipation.   Rivaroxaban (XARELTO) 15 MG TABS tablet TAKE 1 TABLET (15 MG TOTAL) BY MOUTH DAILY.   [DISCONTINUED] Calcium Carbonate (CALCIUM 500 PO) Take 1 tablet by mouth daily.     No facility-administered encounter medications on file as of 11/06/2023.     SIGNIFICANT DIAGNOSTIC EXAMS  TODAY  11-01-23: wbc 16.6; hgb 14.0; hct 44.1; mcv 995.9 plt 253; glucose 125; bun 26; creat 1.18; k+ 4.0; na++ 136; ca 9.9; gfr 44 11-05-23: wbc 13.1; hgb 11.0; hct 33.1; mcv 90.9 plt 162; glucose 105; bun 27; creat 1.07; k+ 4.1; na++ 131; ca 8.8; gfr 49   Review of Systems  Constitutional:  Negative for malaise/fatigue.  Respiratory:  Negative for cough and shortness of breath.   Cardiovascular:  Negative for chest pain, palpitations and leg  swelling.  Gastrointestinal:  Negative for abdominal pain, constipation and heartburn.  Musculoskeletal:  Negative for back pain, joint pain and myalgias.  Skin: Negative.   Neurological:  Negative for dizziness.  Psychiatric/Behavioral:  The patient is not nervous/anxious.    Physical Exam Constitutional:      General: She is not in acute distress.    Appearance: She is well-developed. She is not diaphoretic.  Neck:     Thyroid: No thyromegaly.  Cardiovascular:     Rate and Rhythm: Normal rate. Rhythm irregular.     Pulses: Normal pulses.     Heart sounds: Normal heart sounds.  Pulmonary:     Effort: Pulmonary effort is normal. No respiratory distress.     Breath sounds: Normal breath sounds.  Abdominal:     General: Bowel sounds are normal. There is no distension.     Palpations: Abdomen is soft.     Tenderness: There is no abdominal tenderness.  Musculoskeletal:        General: Normal range of motion.     Cervical back: Neck supple.     Right lower leg: No edema.     Left lower leg: No edema.  Lymphadenopathy:     Cervical: No cervical adenopathy.  Skin:    General: Skin is warm and dry.  Neurological:     Mental Status: She is alert. Mental status is at baseline.  Psychiatric:        Mood and Affect: Mood normal.      ASSESSMENT/ PLAN:  TODAY  Closed nondisplaced fracture of left acetabulum with delayed healing unspecified portion of acetabulum subsequent encounter: will continue therapy as directed to improve upon her level of independence with her adl care. Will  follow up with orthopedics. Will continue tylenol 650 mg three times daily   2. Stage 3b chronic kidney disease: bun 27; creat 1.07; gfr 49  3. History of stroke: will monitor   4. Anemia iron deficiency microcytic: hgb 11.0  5. Moderate vascular dementia without behavioral disturbance, psychotic disturbance, mood disturbance or anxiety: weight is 142 pounds; will continue namenda 10 mg twice daily    6. Chronic atrial fibrillation with rvr: heart rate is stable will continue cardizem ER 180 mg daily for rate control and xarelto 15 mg daily   7. Aortic atherosclerosis (ct 11-01-23): is on statin  8. Hyperlipidemia: will continue lipitor 5 mg daily   Will check cbc cmp    Synthia Innocent NP [facility]  call [phone]

## 2023-11-07 ENCOUNTER — Non-Acute Institutional Stay (SKILLED_NURSING_FACILITY): Payer: Self-pay | Admitting: Internal Medicine

## 2023-11-07 ENCOUNTER — Encounter: Payer: Self-pay | Admitting: Internal Medicine

## 2023-11-07 DIAGNOSIS — N1831 Chronic kidney disease, stage 3a: Secondary | ICD-10-CM

## 2023-11-07 DIAGNOSIS — I482 Chronic atrial fibrillation, unspecified: Secondary | ICD-10-CM

## 2023-11-07 DIAGNOSIS — F01B0 Vascular dementia, moderate, without behavioral disturbance, psychotic disturbance, mood disturbance, and anxiety: Secondary | ICD-10-CM | POA: Diagnosis not present

## 2023-11-07 DIAGNOSIS — S32402G Unspecified fracture of left acetabulum, subsequent encounter for fracture with delayed healing: Secondary | ICD-10-CM | POA: Diagnosis not present

## 2023-11-07 DIAGNOSIS — D508 Other iron deficiency anemias: Secondary | ICD-10-CM

## 2023-11-07 NOTE — Assessment & Plan Note (Addendum)
 There is marked irregularity of cardiac rhythm but rate is adequately controlled.  If the systolic blood pressure remains elevated; her diltiazem dose can be titrated up (presently at relatively high dose of 180 mg in extended format) or metoprolol substituted with titration as clinically indicated.

## 2023-11-07 NOTE — Assessment & Plan Note (Signed)
 PT/OT SNF as tolerated.  Discontinue Robaxin as soon as possible because of risk of falls.  Orthopedic follow-up as scheduled.

## 2023-11-07 NOTE — Assessment & Plan Note (Addendum)
 3/29 - 11/05/2023 admitted with closed left acetabular fracture and nondisplaced fracture of the left inferior pubic ramus and right superior pubic ramus.  Initial H/H 14/44.1; final value 11/33.1.  Xarelto was initially held but subsequently restarted.  Significantly she has a history of AV malformations of the GI tract.  Monitor for any bleeding dyscrasias here at the facility.

## 2023-11-07 NOTE — Progress Notes (Signed)
 NURSING HOME LOCATION:  [facility] ROOM NUMBER: [id number]  CODE STATUS: Full code  PCP: [facility]  This is a comprehensive admission note to this SNFperformed on this date less than 30 days from date of admission. Included are preadmission medical/surgical history; reconciled medication list; family history; social history and comprehensive review of systems.  Corrections and additions to the records were documented. Comprehensive physical exam was also performed. Additionally a clinical summary was entered for each active diagnosis pertinent to this admission in the Problem List to enhance continuity of care.  HPI: She was visualized 3/29 - 11/05/2023, admitted with closed left acetabular fracture.  She presented with severe left hip and pelvic pain after a fall.  Imaging revealed comminuted fracture of the left acetabulum involving both columns and multiple mildly displaced fragments.  Nondisplaced fracture of the left inferior pubic ramus and right superior pubic ramus were also documented.  Orthopedics consulted and recommended nonsurgical intervention.  Hospitalization was complicated by A-fib with rapid ventricular response.  She was placed on diltiazem drip but this had to be discontinued due to hypotension.  Initially Xarelto was held but was subsequently restarted.  Rate control was achieved with metoprolol with subsequent conversion to her diltiazem maintenance dose PTA. Suspicion for UTI resulted in her receiving cephalexin for 3 days.  No urine culture was collected. During the hospitalization she developed mild hyponatremia with sodium dropping from 136 to a final value 131.  Mild hyperglycemia was present with glucoses ranging from 101 up to 125.  Creatinine was initially 1.18 but dropped to 1.07 at discharge.  Initial GFR was 44, representing high stage IIIb CKD; final value was 49 indicating low 3A CKD.  She did develop mild anemia with initial H/H of  14/44.1 dropping to 11/33.1. PT/OT consulted and recommended SNF placement for rehab. Orthopedic and Cardiology follow-ups were to be completed following discharge from SNF.  Past medical and surgical history: Includes AV malformation of the GI tract, history of stroke, vascular dementia, dyslipidemia, persistent A-fib, osteopenia, microcytic anemia, and RLS. Surgeries and procedures include excisional breast biopsy, AF ablation, cardioversion, and cataract extractions.  Family history: reviewed, non contributory due to advanced age.  Social history: Nondrinker; former 30-pack-year smoker.   Review of systems could not be completed due to dementia.  When I asked  why she had been hospitalized ,her response was "when I do anything ,I do it right."  She did state that she fell and "they checked me out, did not break no bones."  She does validate some stiffness in the left hip and back but review of systems was otherwise negative. She seemed to deny any cardiac or neurologic prodrome prior to the fall.  Physical exam:  Pertinent or positive findings: She was sitting up on the bedside as the MMSE was being completed by Speech Therapy.  She had a stuffed rabbit which she referred to as "my teddy bear."  She stated that "I just got him and he does not have a name."  She questioned whether she should name him "Freddy the [place]." Her hair was disheveled.  There is minimal exotropia of the right eye.  Slight proptosis was suggested at the eyes.  She has complete dentures.  There was marked irregularity to the cardiac rhythm. Pedal pulses were decreased to palpation.  There is ecchymosis of the dorsum of the hand.  General appearance: no acute distress, increased work of breathing is present.   Lymphatic: No lymphadenopathy about  the head, neck, axilla. Eyes: No conjunctival inflammation or lid edema is present. There is no scleral icterus. Ears:  External ear exam shows no significant lesions or  deformities.   Nose:  External nasal examination shows no deformity or inflammation. Nasal mucosa are pink and moist without lesions, exudates Neck:  No thyromegaly, masses, tenderness noted.    Heart:  No gallop, murmur, click, rub.  Lungs:  without wheezes, rhonchi, rales, rubs. Abdomen: Bowel sounds are normal.  Abdomen is soft and nontender with no organomegaly, hernias, masses. GU: Deferred  Extremities:  No cyanosis, clubbing, edema. Neurologic exam: Balance, Rhomberg, finger to nose testing could not be completed due to clinical state Skin: Warm & dry w/o tenting. No significant lesions or rash.  See clinical summary under each active problem in the Problem List with associated updated therapeutic plan

## 2023-11-07 NOTE — Patient Instructions (Signed)
 See assessment and plan under each diagnosis in the problem list and acutely for this visit

## 2023-11-07 NOTE — Assessment & Plan Note (Signed)
 Current creatinine is 1.07 with a GFR of 49 indicating low stage IIIa CKD.  Med list reviewed; no indication for change in meds or dosages.

## 2023-11-07 NOTE — Assessment & Plan Note (Addendum)
 Imaging reveals mild cerebral atrophy and small vessel ischemic changes as well as evidence of small chronic infarct in the left insula.  MMSE suggests only mild dementia.  During my interview & exam it was obvious [name] has no comprehension of the extent of her injuries, denying any fractures.  [name] cannot comprehend the risk of trying to ambulate by herself.  Additionally [name] confabulated about a stuffed rabbit being her newly acquired "teddy bear."

## 2023-11-11 DIAGNOSIS — I700 Atherosclerosis of aorta: Secondary | ICD-10-CM | POA: Insufficient documentation

## 2023-11-13 ENCOUNTER — Encounter: Payer: Self-pay | Admitting: Adult Health

## 2023-11-13 ENCOUNTER — Other Ambulatory Visit (HOSPITAL_COMMUNITY)
Admission: RE | Admit: 2023-11-13 | Discharge: 2023-11-13 | Disposition: A | Discharge status: Home / Self Care | Source: Skilled Nursing Facility | Attending: Adult Health | Admitting: Adult Health

## 2023-11-13 ENCOUNTER — Non-Acute Institutional Stay (SKILLED_NURSING_FACILITY): Payer: Self-pay | Admitting: Adult Health

## 2023-11-13 DIAGNOSIS — S32402D Unspecified fracture of left acetabulum, subsequent encounter for fracture with routine healing: Secondary | ICD-10-CM | POA: Insufficient documentation

## 2023-11-13 DIAGNOSIS — D508 Other iron deficiency anemias: Secondary | ICD-10-CM | POA: Diagnosis not present

## 2023-11-13 DIAGNOSIS — E43 Unspecified severe protein-calorie malnutrition: Secondary | ICD-10-CM | POA: Diagnosis not present

## 2023-11-13 LAB — COMPREHENSIVE METABOLIC PANEL WITH GFR
ALT: 25 U/L (ref 0–44)
AST: 39 U/L (ref 15–41)
Albumin: 2.7 g/dL — ABNORMAL LOW (ref 3.5–5.0)
Alkaline Phosphatase: 135 U/L — ABNORMAL HIGH (ref 38–126)
Anion gap: 10 (ref 5–15)
BUN: 18 mg/dL (ref 8–23)
CO2: 21 mmol/L — ABNORMAL LOW (ref 22–32)
Calcium: 8.6 mg/dL — ABNORMAL LOW (ref 8.9–10.3)
Chloride: 101 mmol/L (ref 98–111)
Creatinine, Ser: 0.86 mg/dL (ref 0.44–1.00)
GFR, Estimated: >60 mL/min (ref >60)
Glucose, Bld: 90 mg/dL (ref 70–99)
Potassium: 4 mmol/L (ref 3.5–5.1)
Sodium: 132 mmol/L — ABNORMAL LOW (ref 135–145)
Total Bilirubin: 1.1 mg/dL (ref 0.0–1.2)
Total Protein: 6.3 g/dL — ABNORMAL LOW (ref 6.5–8.1)

## 2023-11-13 LAB — CBC
HCT: 30.6 % — ABNORMAL LOW (ref 36.0–46.0)
Hemoglobin: 10.1 g/dL — ABNORMAL LOW (ref 12.0–15.0)
MCH: 30.8 pg (ref 26.0–34.0)
MCHC: 33 g/dL (ref 30.0–36.0)
MCV: 93.3 fL (ref 80.0–100.0)
Platelets: 457 10*3/uL — ABNORMAL HIGH (ref 150–400)
RBC: 3.28 MIL/uL — ABNORMAL LOW (ref 3.87–5.11)
RDW: 15.1 % (ref 11.5–15.5)
WBC: 12.1 10*3/uL — ABNORMAL HIGH (ref 4.0–10.5)
nRBC: 0 % (ref 0.0–0.2)

## 2023-11-13 NOTE — Progress Notes (Signed)
 Location:  [facility] Room Number: 143 Place of Service:  SNF (31)   CODE STATUS: dnr   No Known Allergies  Chief Complaint  Patient presents with   Acute Visit    Follow up labs     HPI:  Her hgb is 10.1; which is trending downward. Her albumin is low at 2.7.  there are no reports of shortness of breath; no excessive fatigue; no chest pain.   Past Medical History:  Diagnosis Date   Arthritis    AV malformation of gastrointestinal tract    in cecum she denies history of GIB   Breast mass, right    Cataract    CVA (cerebral vascular accident) (HCC) 2007   Dementia (HCC)    Dizziness    denies this as an ongoing problem   Hyperlipidemia    Microcytic anemia    Osteopenia    Persistent atrial fibrillation (HCC)    s/p PVI 04/2010  CHADS2vasc at least 5   RLS (restless legs syndrome)    Sinus bradycardia     Past Surgical History:  Procedure Laterality Date   afib ablation  04/2010   PVI with CTI ablation performed by JA   BREAST EXCISIONAL BIOPSY Right    benign cyst   breast mass resected     CARDIOVERSION     for afib   CATARACT EXTRACTION W/PHACO  11/11/2011   Procedure: CATARACT EXTRACTION PHACO AND INTRAOCULAR LENS PLACEMENT (IOC);  Surgeon: Gemma Payor, MD;  Location: [facility];  Service: Ophthalmology;  Laterality: Left;  CDE:12.62   CATARACT EXTRACTION W/PHACO  11/25/2011   Procedure: CATARACT EXTRACTION PHACO AND INTRAOCULAR LENS PLACEMENT (IOC);  Surgeon: Gemma Payor, MD;  Location: [facility];  Service: Ophthalmology;  Laterality: Right;  CDE 16.14   dental inplants      Social History   Socioeconomic History   Marital status: Widowed    Spouse name: Not on file   Number of children: 0   Years of education: college   Highest education level: Not on file  Occupational History   Occupation: retired  Tobacco Use   Smoking status: Former    Current packs/day: 0.00    Average packs/day: 1 pack/day for 30.0 years (30.0 ttl pk-yrs)     Types: Cigarettes    Start date: 08/05/1948    Quit date: 08/05/1978    Years since quitting: 45.3   Smokeless tobacco: Never   Tobacco comments:    quit in 1980s  Vaping Use   Vaping status: Never Used  Substance and Sexual Activity   Alcohol use: No   Drug use: No   Sexual activity: Never  Other Topics Concern   Not on file  Social History Narrative   Patient is retired and lives at [facility]    Cousin [place] and his wife Purnell Shoemaker are her closest living relatives.  They are who will make medical decisions for her if she is unable.   Patient has business college education. Right handed.    Caffeine- some times - soda    Daily exercise at [facility] - walks a mile a day at home.    Church every Sunday   Likes to go on trips with the rec center   Wears hearing aids/ dentures   Social Drivers of Health   Financial Resource Strain: Low Risk  (06/21/2022)   Overall Financial Resource Strain (CARDIA)    Difficulty of Paying Living Expenses: Not hard at  all  Food Insecurity: No Food Insecurity (11/02/2023)   Hunger Vital Sign    Worried About Running Out of Food in the Last Year: Never true    Ran Out of Food in the Last Year: Never true  Transportation Needs: No Transportation Needs (11/02/2023)   PRAPARE - Administrator, Civil Service (Medical): No    Lack of Transportation (Non-Medical): No  Physical Activity: Sufficiently Active (06/21/2022)   Exercise Vital Sign    Days of Exercise per Week: 5 days    Minutes of Exercise per Session: 60 min  Stress: No Stress Concern Present (06/21/2022)   [employer] of Occupational Health - Occupational Stress Questionnaire    Feeling of Stress : Not at all  Social Connections: Moderately Integrated (11/02/2023)   Social Connection and Isolation Panel [NHANES]    Frequency of Communication with Friends and Family: Three times a week    Frequency of Social Gatherings with Friends and Family: Three times a week     Attends Religious Services: More than 4 times per year    Active Member of Clubs or Organizations: Yes    Attends Banker Meetings: More than 4 times per year    Marital Status: Widowed  Intimate Partner Violence: Not At Risk (11/02/2023)   Humiliation, Afraid, Rape, and Kick questionnaire    Fear of Current or Ex-Partner: No    Emotionally Abused: No    Physically Abused: No    Sexually Abused: No   Family History  Problem Relation Age of Onset   Stroke Mother 17   Hip fracture Mother    Lung cancer Father 74   Anesthesia problems Neg Hx    Hypotension Neg Hx    Malignant hyperthermia Neg Hx    Pseudochol deficiency Neg Hx    Breast cancer Neg Hx       VITAL SIGNS BP 102/64   Pulse 74   Temp 98 F (36.7 C)   Resp 18   Ht 5\' 3"  (1.6 m)   Wt 141 lb 12.8 oz (64.3 kg)   SpO2 98%   BMI 25.12 kg/m   Outpatient Encounter Medications as of 11/13/2023  Medication Sig   acetaminophen (TYLENOL) 325 MG tablet Take 2 tablets (650 mg total) by mouth every 8 (eight) hours.   Ascorbic Acid (VITAMIN C) 500 MG CAPS Take 500 mg by mouth daily.   atorvastatin (LIPITOR) 10 MG tablet TAKE 1/2 TABLET BY MOUTH ONCE A DAY OR AS INSTRUCTED BY PHYSICIAN (Patient taking differently: Take 10 mg by mouth daily. TAKE 1/2 TABLET BY MOUTH ONCE A DAY OR AS INSTRUCTED BY PHYSICIAN)   diltiazem (CARDIZEM CD) 180 MG 24 hr capsule TAKE 1 CAPSULE BY MOUTH EVERY DAY   feeding supplement (ENSURE ENLIVE / ENSURE PLUS) LIQD Take 237 mLs by mouth 2 (two) times daily between meals.   folic acid (FOLVITE) 800 MCG tablet Take 400 mcg by mouth daily.   Magnesium Oxide -Mg Supplement 500 MG TABS Take 1 tablet by mouth daily.   memantine (NAMENDA) 10 MG tablet Take 1 tablet (10 mg total) by mouth 2 (two) times daily.   methocarbamol (ROBAXIN) 500 MG tablet Take 1 tablet (500 mg total) by mouth every 6 (six) hours as needed for muscle spasms.   Multiple Vitamin (MULTIVITAMIN) capsule Take 1 capsule by mouth  daily.     oxyCODONE (OXY IR/ROXICODONE) 5 MG immediate release tablet Take 0.5 tablets (2.5 mg total) by mouth every 4 (four)  hours as needed for moderate pain (pain score 4-6).   polyethylene glycol (MIRALAX / GLYCOLAX) 17 g packet Take 17 g by mouth daily as needed for mild constipation.   Rivaroxaban (XARELTO) 15 MG TABS tablet TAKE 1 TABLET (15 MG TOTAL) BY MOUTH DAILY.   [DISCONTINUED] Calcium Carbonate (CALCIUM 500 PO) Take 1 tablet by mouth daily.     No facility-administered encounter medications on file as of 11/13/2023.     SIGNIFICANT DIAGNOSTIC EXAMS  PREVIOUS   11-01-23: wbc 16.6; hgb 14.0; hct 44.1; mcv 995.9 plt 253; glucose 125; bun 26; creat 1.18; k+ 4.0; na++ 136; ca 9.9; gfr 44 11-05-23: wbc 13.1; hgb 11.0; hct 33.1; mcv 90.9 plt 162; glucose 105; bun 27; creat 1.07; k+ 4.1; na++ 131; ca 8.8; gfr 49   TODAY  11-13-23: wbc 12.1; hgb 10.1; hct 30.6; mcv 93.3 plt 457; glucose 90; bun 18; creat 0.86; k + 4.0; na++ 132; c 8.6 gfr >60; alk phos 135; protein 6.3 albumin 2.7   Review of Systems  Constitutional:  Negative for malaise/fatigue.  Respiratory:  Negative for cough and shortness of breath.   Cardiovascular:  Negative for chest pain, palpitations and leg swelling.  Gastrointestinal:  Negative for abdominal pain, constipation and heartburn.  Musculoskeletal:  Negative for back pain, joint pain and myalgias.  Skin: Negative.   Neurological:  Negative for dizziness.  Psychiatric/Behavioral:  The patient is not nervous/anxious.    Physical Exam Constitutional:      General: She is not in acute distress.    Appearance: She is well-developed. She is not diaphoretic.  Neck:     Thyroid: No thyromegaly.  Cardiovascular:     Rate and Rhythm: Normal rate and regular rhythm.     Heart sounds: Normal heart sounds.     No friction rub.  Pulmonary:     Effort: Pulmonary effort is normal. No respiratory distress.     Breath sounds: Normal breath sounds.  Abdominal:      General: Bowel sounds are normal. There is no distension.     Palpations: Abdomen is soft.     Tenderness: There is no abdominal tenderness.  Musculoskeletal:        General: Normal range of motion.     Cervical back: Neck supple.     Right lower leg: No edema.     Left lower leg: No edema.  Lymphadenopathy:     Cervical: No cervical adenopathy.  Skin:    General: Skin is warm and dry.  Neurological:     Mental Status: She is alert. Mental status is at baseline.  Psychiatric:        Mood and Affect: Mood normal.      ASSESSMENT/ PLAN:  TODAY  ANEMIA, IRON DEFICIENCY, MICROCYTIC Her hgb is 10.1 which is trending downward. Mcv 93.3 will begin ferrous sulfate 3 times weekly.   Protein-calorie malnutrition, severe (HCC) Albumin is 2.7 will begin prosource 30 mL three times daily     Synthia Innocent NP [facility]   call [phone]

## 2023-11-13 NOTE — Assessment & Plan Note (Signed)
 Albumin is 2.7 will begin prosource 30 mL three times daily

## 2023-11-13 NOTE — Assessment & Plan Note (Signed)
 Her hgb is 10.1 which is trending downward. Mcv 93.3 will begin ferrous sulfate 3 times weekly.

## 2023-11-17 ENCOUNTER — Encounter: Payer: Self-pay | Admitting: Adult Health

## 2023-11-17 ENCOUNTER — Non-Acute Institutional Stay (SKILLED_NURSING_FACILITY): Payer: Self-pay | Admitting: Adult Health

## 2023-11-17 DIAGNOSIS — S32402G Unspecified fracture of left acetabulum, subsequent encounter for fracture with delayed healing: Secondary | ICD-10-CM | POA: Diagnosis not present

## 2023-11-17 NOTE — Progress Notes (Unsigned)
 Location:  [facility] Room Number: 143 Place of Service:  SNF (31)   CODE STATUS: dnr   No Known Allergies  Chief Complaint  Patient presents with   Acute Visit    Pain management     HPI:  She continues to participate in therapy. She is having pain due to her  pelvic fractures. She has robaxin as needed for pain management. The staff is asking for an extension of her oxycodone due to her severe pain after therapy. She is willing to try meloxicam for her pain management as well.   Past Medical History:  Diagnosis Date   Arthritis    AV malformation of gastrointestinal tract    in cecum she denies history of GIB   Breast mass, right    Cataract    CVA (cerebral vascular accident) (HCC) 2007   Dementia (HCC)    Dizziness    denies this as an ongoing problem   Hyperlipidemia    Microcytic anemia    Osteopenia    Persistent atrial fibrillation (HCC)    s/p PVI 04/2010  CHADS2vasc at least 5   RLS (restless legs syndrome)    Sinus bradycardia     Past Surgical History:  Procedure Laterality Date   afib ablation  04/2010   PVI with CTI ablation performed by [facility]   BREAST EXCISIONAL BIOPSY Right    benign cyst   breast mass resected     CARDIOVERSION     for afib   CATARACT EXTRACTION W/PHACO  11/11/2011   Procedure: CATARACT EXTRACTION PHACO AND INTRAOCULAR LENS PLACEMENT (IOC);  Surgeon: Anner Kill, MD;  Location: [facility];  Service: Ophthalmology;  Laterality: Left;  CDE:12.62   CATARACT EXTRACTION W/PHACO  11/25/2011   Procedure: CATARACT EXTRACTION PHACO AND INTRAOCULAR LENS PLACEMENT (IOC);  Surgeon: Anner Kill, MD;  Location: [facility];  Service: Ophthalmology;  Laterality: Right;  CDE 16.14   dental inplants      Social History   Socioeconomic History   Marital status: Widowed    Spouse name: Not on file   Number of children: 0   Years of education: college   Highest education level: Not on file  Occupational History   Occupation: retired   Tobacco Use   Smoking status: Former    Current packs/day: 0.00    Average packs/day: 1 pack/day for 30.0 years (30.0 ttl pk-yrs)    Types: Cigarettes    Start date: 08/05/1948    Quit date: 08/05/1978    Years since quitting: 45.3   Smokeless tobacco: Never   Tobacco comments:    quit in 1980s  Vaping Use   Vaping status: Never Used  Substance and Sexual Activity   Alcohol use: No   Drug use: No   Sexual activity: Never  Other Topics Concern   Not on file  Social History Narrative   Patient is retired and lives at [facility]    Cousin [place] and his wife Ron Cobbs are her closest living relatives.  They are who will make medical decisions for her if she is unable.   Patient has business college education. Right handed.    Caffeine- some times - soda    Daily exercise at [facility] - walks a mile a day at home.    Church every Sunday   Likes to go on trips with the rec center   Wears hearing aids/ dentures   Social Drivers of Corporate investment banker Strain:  Low Risk  (06/21/2022)   Overall Financial Resource Strain (CARDIA)    Difficulty of Paying Living Expenses: Not hard at all  Food Insecurity: No Food Insecurity (11/02/2023)   Hunger Vital Sign    Worried About Running Out of Food in the Last Year: Never true    Ran Out of Food in the Last Year: Never true  Transportation Needs: No Transportation Needs (11/02/2023)   PRAPARE - Administrator, Civil Service (Medical): No    Lack of Transportation (Non-Medical): No  Physical Activity: Sufficiently Active (06/21/2022)   Exercise Vital Sign    Days of Exercise per Week: 5 days    Minutes of Exercise per Session: 60 min  Stress: No Stress Concern Present (06/21/2022)   [employer] of Occupational Health - Occupational Stress Questionnaire    Feeling of Stress : Not at all  Social Connections: Moderately Integrated (11/02/2023)   Social Connection and Isolation Panel [NHANES]    Frequency of  Communication with Friends and Family: Three times a week    Frequency of Social Gatherings with Friends and Family: Three times a week    Attends Religious Services: More than 4 times per year    Active Member of Clubs or Organizations: Yes    Attends Banker Meetings: More than 4 times per year    Marital Status: Widowed  Intimate Partner Violence: Not At Risk (11/02/2023)   Humiliation, Afraid, Rape, and Kick questionnaire    Fear of Current or Ex-Partner: No    Emotionally Abused: No    Physically Abused: No    Sexually Abused: No   Family History  Problem Relation Age of Onset   Stroke Mother 43   Hip fracture Mother    Lung cancer Father 63   Anesthesia problems Neg Hx    Hypotension Neg Hx    Malignant hyperthermia Neg Hx    Pseudochol deficiency Neg Hx    Breast cancer Neg Hx       VITAL SIGNS BP 117/70   Pulse 100   Temp 98 F (36.7 C)   Resp 20   Ht 5' 3.5" (1.613 m)   Wt 141 lb 12.8 oz (64.3 kg)   SpO2 97%   BMI 24.72 kg/m   Outpatient Encounter Medications as of 11/17/2023  Medication Sig   acetaminophen (TYLENOL) 325 MG tablet Take 2 tablets (650 mg total) by mouth every 8 (eight) hours.   Ascorbic Acid (VITAMIN C) 500 MG CAPS Take 500 mg by mouth daily.   atorvastatin (LIPITOR) 10 MG tablet TAKE 1/2 TABLET BY MOUTH ONCE A DAY OR AS INSTRUCTED BY PHYSICIAN (Patient taking differently: Take 10 mg by mouth daily. TAKE 1/2 TABLET BY MOUTH ONCE A DAY OR AS INSTRUCTED BY PHYSICIAN)   diltiazem (CARDIZEM CD) 180 MG 24 hr capsule TAKE 1 CAPSULE BY MOUTH EVERY DAY   feeding supplement (ENSURE ENLIVE / ENSURE PLUS) LIQD Take 237 mLs by mouth 2 (two) times daily between meals.   ferrous sulfate 325 (65 FE) MG EC tablet Take 325 mg by mouth 3 (three) times a week.   folic acid (FOLVITE) 800 MCG tablet Take 400 mcg by mouth daily.   Magnesium Oxide -Mg Supplement 500 MG TABS Take 1 tablet by mouth daily.   memantine (NAMENDA) 10 MG tablet Take 1 tablet (10  mg total) by mouth 2 (two) times daily.   methocarbamol (ROBAXIN) 500 MG tablet Take 1 tablet (500 mg total) by mouth every 6 (  six) hours as needed for muscle spasms.   Multiple Vitamin (MULTIVITAMIN) capsule Take 1 capsule by mouth daily.     oxyCODONE (OXY IR/ROXICODONE) 5 MG immediate release tablet Take 0.5 tablets (2.5 mg total) by mouth every 4 (four) hours as needed for moderate pain (pain score 4-6).   polyethylene glycol (MIRALAX / GLYCOLAX) 17 g packet Take 17 g by mouth daily as needed for mild constipation.   Protein (PROSOURCE PO) Take 30 mLs by mouth 3 (three) times daily.   Rivaroxaban (XARELTO) 15 MG TABS tablet TAKE 1 TABLET (15 MG TOTAL) BY MOUTH DAILY.   [DISCONTINUED] Calcium Carbonate (CALCIUM 500 PO) Take 1 tablet by mouth daily.     No facility-administered encounter medications on file as of 11/17/2023.     SIGNIFICANT DIAGNOSTIC EXAMS   PREVIOUS   11-01-23: wbc 16.6; hgb 14.0; hct 44.1; mcv 995.9 plt 253; glucose 125; bun 26; creat 1.18; k+ 4.0; na++ 136; ca 9.9; gfr 44 11-05-23: wbc 13.1; hgb 11.0; hct 33.1; mcv 90.9 plt 162; glucose 105; bun 27; creat 1.07; k+ 4.1; na++ 131; ca 8.8; gfr 49  11-13-23: wbc 12.1; hgb 10.1; hct 30.6; mcv 93.3 plt 457; glucose 90; bun 18; creat 0.86; k + 4.0; na++ 132; c 8.6 gfr >60; alk phos 135; protein 6.3 albumin 2.7  NO NEW LABS.    Review of Systems  Constitutional:  Negative for malaise/fatigue.  Respiratory:  Negative for cough and shortness of breath.   Cardiovascular:  Negative for chest pain, palpitations and leg swelling.  Gastrointestinal:  Negative for abdominal pain, constipation and heartburn.  Musculoskeletal:  Positive for joint pain and myalgias. Negative for back pain.  Skin: Negative.   Neurological:  Negative for dizziness.  Psychiatric/Behavioral:  The patient is not nervous/anxious.    Physical Exam Constitutional:      General: She is not in acute distress.    Appearance: She is well-developed. She is not  diaphoretic.  Neck:     Thyroid: No thyromegaly.  Cardiovascular:     Rate and Rhythm: Normal rate and regular rhythm.     Heart sounds: Normal heart sounds.  Pulmonary:     Effort: Pulmonary effort is normal. No respiratory distress.     Breath sounds: Normal breath sounds.  Abdominal:     General: Bowel sounds are normal. There is no distension.     Palpations: Abdomen is soft.     Tenderness: There is no abdominal tenderness.  Musculoskeletal:        General: Normal range of motion.     Cervical back: Neck supple.     Right lower leg: No edema.     Left lower leg: No edema.  Lymphadenopathy:     Cervical: No cervical adenopathy.  Skin:    General: Skin is warm and dry.  Neurological:     Mental Status: She is alert. Mental status is at baseline.  Psychiatric:        Mood and Affect: Mood normal.      ASSESSMENT/ PLAN:  TODAY  Closed left acetabular fracture (HCC) She continues to have significant pain especially after therapy. Will continue oxycodone 5 mg prior to therapy through 11-24-23. Will also begin meloxicam 7.5 mg daily      Synthia Innocent NP [facility]  call [phone]

## 2023-11-18 ENCOUNTER — Encounter: Payer: Self-pay | Admitting: Adult Health

## 2023-11-18 ENCOUNTER — Non-Acute Institutional Stay (SKILLED_NURSING_FACILITY): Payer: Self-pay | Admitting: Adult Health

## 2023-11-18 ENCOUNTER — Other Ambulatory Visit (HOSPITAL_COMMUNITY)
Admission: RE | Admit: 2023-11-18 | Discharge: 2023-11-18 | Disposition: A | Discharge status: Home / Self Care | Source: Skilled Nursing Facility | Attending: Adult Health | Admitting: Adult Health

## 2023-11-18 DIAGNOSIS — J09X1 Influenza due to identified novel influenza A virus with pneumonia: Secondary | ICD-10-CM | POA: Insufficient documentation

## 2023-11-18 DIAGNOSIS — I700 Atherosclerosis of aorta: Secondary | ICD-10-CM | POA: Diagnosis not present

## 2023-11-18 DIAGNOSIS — F01B0 Vascular dementia, moderate, without behavioral disturbance, psychotic disturbance, mood disturbance, and anxiety: Secondary | ICD-10-CM

## 2023-11-18 DIAGNOSIS — S32402G Unspecified fracture of left acetabulum, subsequent encounter for fracture with delayed healing: Secondary | ICD-10-CM

## 2023-11-18 LAB — RESP PANEL BY RT-PCR (FLU A&B, COVID) ARPGX2
Influenza A by PCR: NEGATIVE
Influenza B by PCR: NEGATIVE
SARS Coronavirus 2 by RT PCR: NEGATIVE

## 2023-11-18 NOTE — Progress Notes (Signed)
 Location:  [facility] Room Number: 143 Place of Service:  SNF (31)   CODE STATUS: dnr   No Known Allergies  Chief Complaint  Patient presents with   Acute Visit    Care plan meeting     HPI:  We have come together for her care plan meeting. Family present. 8/15 mood 1/30 fidgety, restless. She is using wheelchair without falls. She requires moderate to maximum assist with her adl care. She is frequently incontinent of bladder and bowel. Dietary: regular diet setup for meals appetite 26-75%; weight is 141.8 pounds; is on supplements. Therapy: nonambulatory as she is unable to maintain toe-touch weight bearing due to cognitive impairment;upper body supervision; lower body maximum assist; transfers maximum; BRP moderate/maximum assist.  She continues to be followed for her chronic illnesses including:  Closed nondisplaced fracture of left acetabulum with delayed healing unspecified portion of acetabulum subsequent encounter   Moderate vascular dementia without behavioral disturbance, psychotic disturbance, mood disturbance or mood.     Aortic atherosclerosis  Past Medical History:  Diagnosis Date   Arthritis    AV malformation of gastrointestinal tract    in cecum she denies history of GIB   Breast mass, right    Cataract    CVA (cerebral vascular accident) (HCC) 2007   Dementia (HCC)    Dizziness    denies this as an ongoing problem   Hyperlipidemia    Microcytic anemia    Osteopenia    Persistent atrial fibrillation (HCC)    s/p PVI 04/2010  CHADS2vasc at least 5   RLS (restless legs syndrome)    Sinus bradycardia     Past Surgical History:  Procedure Laterality Date   afib ablation  04/2010   PVI with CTI ablation performed by JA   BREAST EXCISIONAL BIOPSY Right    benign cyst   breast mass resected     CARDIOVERSION     for afib   CATARACT EXTRACTION W/PHACO  11/11/2011   Procedure: CATARACT EXTRACTION PHACO AND INTRAOCULAR LENS PLACEMENT (IOC);   Surgeon: Anner Kill, MD;  Location: [facility];  Service: Ophthalmology;  Laterality: Left;  CDE:12.62   CATARACT EXTRACTION W/PHACO  11/25/2011   Procedure: CATARACT EXTRACTION PHACO AND INTRAOCULAR LENS PLACEMENT (IOC);  Surgeon: Anner Kill, MD;  Location: [facility];  Service: Ophthalmology;  Laterality: Right;  CDE 16.14   dental inplants      Social History   Socioeconomic History   Marital status: Widowed    Spouse name: Not on file   Number of children: 0   Years of education: college   Highest education level: Not on file  Occupational History   Occupation: retired  Tobacco Use   Smoking status: Former    Current packs/day: 0.00    Average packs/day: 1 pack/day for 30.0 years (30.0 ttl pk-yrs)    Types: Cigarettes    Start date: 08/05/1948    Quit date: 08/05/1978    Years since quitting: 45.3   Smokeless tobacco: Never   Tobacco comments:    quit in 1980s  Vaping Use   Vaping status: Never Used  Substance and Sexual Activity   Alcohol use: No   Drug use: No   Sexual activity: Never  Other Topics Concern   Not on file  Social History Narrative   Patient is retired and lives at [facility]    Cousin Baker and his wife Ron Cobbs are her closest living relatives.  They are who will make  medical decisions for her if she is unable.   Patient has business college education. Right handed.    Caffeine- some times - soda    Daily exercise at [facility] - walks a mile a day at home.    Church every Sunday   Likes to go on trips with the rec center   Wears hearing aids/ dentures   Social Drivers of Health   Financial Resource Strain: Low Risk  (06/21/2022)   Overall Financial Resource Strain (CARDIA)    Difficulty of Paying Living Expenses: Not hard at all  Food Insecurity: No Food Insecurity (11/02/2023)   Hunger Vital Sign    Worried About Running Out of Food in the Last Year: Never true    Ran Out of Food in the Last Year: Never true  Transportation Needs: No Transportation  Needs (11/02/2023)   PRAPARE - Administrator, Civil Service (Medical): No    Lack of Transportation (Non-Medical): No  Physical Activity: Sufficiently Active (06/21/2022)   Exercise Vital Sign    Days of Exercise per Week: 5 days    Minutes of Exercise per Session: 60 min  Stress: No Stress Concern Present (06/21/2022)   [employer] of Occupational Health - Occupational Stress Questionnaire    Feeling of Stress : Not at all  Social Connections: Moderately Integrated (11/02/2023)   Social Connection and Isolation Panel [NHANES]    Frequency of Communication with Friends and Family: Three times a week    Frequency of Social Gatherings with Friends and Family: Three times a week    Attends Religious Services: More than 4 times per year    Active Member of Clubs or Organizations: Yes    Attends Banker Meetings: More than 4 times per year    Marital Status: Widowed  Intimate Partner Violence: Not At Risk (11/02/2023)   Humiliation, Afraid, Rape, and Kick questionnaire    Fear of Current or Ex-Partner: No    Emotionally Abused: No    Physically Abused: No    Sexually Abused: No   Family History  Problem Relation Age of Onset   Stroke Mother 54   Hip fracture Mother    Lung cancer Father 52   Anesthesia problems Neg Hx    Hypotension Neg Hx    Malignant hyperthermia Neg Hx    Pseudochol deficiency Neg Hx    Breast cancer Neg Hx       VITAL SIGNS BP 117/70   Pulse 100   Temp 98 F (36.7 C)   Resp 20   Ht 5' 3.5" (1.613 m)   Wt 141 lb 12.8 oz (64.3 kg)   SpO2 97%   BMI 24.72 kg/m   Outpatient Encounter Medications as of 11/18/2023  Medication Sig   atorvastatin (LIPITOR) 10 MG tablet TAKE 1/2 TABLET BY MOUTH ONCE A DAY OR AS INSTRUCTED BY PHYSICIAN (Patient taking differently: Take 5 mg by mouth daily. TAKE 1/2 TABLET BY MOUTH ONCE A DAY OR AS INSTRUCTED BY PHYSICIAN)   meloxicam (MOBIC) 7.5 MG tablet Take 7.5 mg by mouth daily.    acetaminophen (TYLENOL) 325 MG tablet Take 2 tablets (650 mg total) by mouth every 8 (eight) hours.   Ascorbic Acid (VITAMIN C) 500 MG CAPS Take 500 mg by mouth daily.   diltiazem (CARDIZEM CD) 180 MG 24 hr capsule TAKE 1 CAPSULE BY MOUTH EVERY DAY   feeding supplement (ENSURE ENLIVE / ENSURE PLUS) LIQD Take 237 mLs by mouth 2 (two) times  daily between meals.   ferrous sulfate 325 (65 FE) MG EC tablet Take 325 mg by mouth 3 (three) times a week.   folic acid (FOLVITE) 800 MCG tablet Take 400 mcg by mouth daily.   Magnesium Oxide -Mg Supplement 500 MG TABS Take 1 tablet by mouth daily.   memantine (NAMENDA) 10 MG tablet Take 1 tablet (10 mg total) by mouth 2 (two) times daily.   methocarbamol (ROBAXIN) 500 MG tablet Take 1 tablet (500 mg total) by mouth every 6 (six) hours as needed for muscle spasms.   Multiple Vitamin (MULTIVITAMIN) capsule Take 1 capsule by mouth daily.     polyethylene glycol (MIRALAX / GLYCOLAX) 17 g packet Take 17 g by mouth daily as needed for mild constipation.   Protein (PROSOURCE PO) Take 30 mLs by mouth 3 (three) times daily.   Rivaroxaban (XARELTO) 15 MG TABS tablet TAKE 1 TABLET (15 MG TOTAL) BY MOUTH DAILY.   [DISCONTINUED] Calcium Carbonate (CALCIUM 500 PO) Take 1 tablet by mouth daily.     [DISCONTINUED] oxyCODONE (OXY IR/ROXICODONE) 5 MG immediate release tablet Take 0.5 tablets (2.5 mg total) by mouth every 4 (four) hours as needed for moderate pain (pain score 4-6).   No facility-administered encounter medications on file as of 11/18/2023.     SIGNIFICANT DIAGNOSTIC EXAMS   PREVIOUS   11-01-23: wbc 16.6; hgb 14.0; hct 44.1; mcv 995.9 plt 253; glucose 125; bun 26; creat 1.18; k+ 4.0; na++ 136; ca 9.9; gfr 44 11-05-23: wbc 13.1; hgb 11.0; hct 33.1; mcv 90.9 plt 162; glucose 105; bun 27; creat 1.07; k+ 4.1; na++ 131; ca 8.8; gfr 49   TODAY  11-13-23: wbc 12.1; hgb 10.1; hct 30.6; mcv 93.3 plt 457; glucose 90; bun 18; creat 0.86; k + 4.0; na++ 132; c 8.6 gfr  >60; alk phos 135; protein 6.3 albumin 2.7   Review of Systems  Constitutional:  Negative for malaise/fatigue.  Respiratory:  Negative for cough and shortness of breath.   Cardiovascular:  Negative for chest pain, palpitations and leg swelling.  Gastrointestinal:  Negative for abdominal pain, constipation and heartburn.  Musculoskeletal:  Positive for joint pain and myalgias. Negative for back pain.  Skin: Negative.   Neurological:  Negative for dizziness.  Psychiatric/Behavioral:  The patient is not nervous/anxious.    Physical Exam Constitutional:      General: She is not in acute distress.    Appearance: She is well-developed. She is not diaphoretic.  Neck:     Thyroid: No thyromegaly.  Cardiovascular:     Rate and Rhythm: Normal rate and regular rhythm.     Heart sounds: Normal heart sounds.  Pulmonary:     Effort: Pulmonary effort is normal. No respiratory distress.     Breath sounds: Normal breath sounds.  Abdominal:     General: Bowel sounds are normal. There is no distension.     Palpations: Abdomen is soft.     Tenderness: There is no abdominal tenderness.  Musculoskeletal:        General: Normal range of motion.     Cervical back: Neck supple.     Right lower leg: No edema.     Left lower leg: No edema.  Lymphadenopathy:     Cervical: No cervical adenopathy.  Skin:    General: Skin is warm and dry.  Neurological:     Mental Status: She is alert. Mental status is at baseline.  Psychiatric:        Mood and Affect: Mood  normal.      ASSESSMENT/ PLAN:  TODAY  Closed nondisplaced fracture of left acetabulum with delayed healing unspecified portion of acetabulum subsequent encounter Moderate vascular dementia without behavioral disturbance, psychotic disturbance, mood disturbance or mood.  Aortic atherosclerosis  She has just been started on mobic 7.5 mg daily  Will change her miralax to daily routinely Will change tylenol to ER three times daily  Will  continue therapy as directed Will continue to monitor her status.  Her goal is to return to assisted living.   Time spent with patient 45 minutes: therapy; medications; dietary    Britt Candle NP [facility]  call [phone]

## 2023-11-19 DIAGNOSIS — S32482D Displaced dome fracture of left acetabulum, subsequent encounter for fracture with routine healing: Secondary | ICD-10-CM | POA: Diagnosis not present

## 2023-11-19 NOTE — Assessment & Plan Note (Addendum)
 She continues to have significant pain especially after therapy. Will continue oxycodone 5 mg prior to therapy through 11-24-23. Will also begin meloxicam 7.5 mg daily

## 2023-11-20 DIAGNOSIS — F39 Unspecified mood [affective] disorder: Secondary | ICD-10-CM | POA: Diagnosis not present

## 2023-11-20 DIAGNOSIS — D649 Anemia, unspecified: Secondary | ICD-10-CM | POA: Diagnosis not present

## 2023-12-01 DIAGNOSIS — S32482D Displaced dome fracture of left acetabulum, subsequent encounter for fracture with routine healing: Secondary | ICD-10-CM | POA: Diagnosis not present

## 2023-12-04 DIAGNOSIS — R262 Difficulty in walking, not elsewhere classified: Secondary | ICD-10-CM | POA: Diagnosis not present

## 2023-12-04 DIAGNOSIS — S32402D Unspecified fracture of left acetabulum, subsequent encounter for fracture with routine healing: Secondary | ICD-10-CM | POA: Diagnosis not present

## 2023-12-04 DIAGNOSIS — S32592D Other specified fracture of left pubis, subsequent encounter for fracture with routine healing: Secondary | ICD-10-CM | POA: Diagnosis not present

## 2023-12-04 DIAGNOSIS — R279 Unspecified lack of coordination: Secondary | ICD-10-CM | POA: Diagnosis not present

## 2023-12-04 DIAGNOSIS — M6281 Muscle weakness (generalized): Secondary | ICD-10-CM | POA: Diagnosis not present

## 2023-12-04 DIAGNOSIS — Z9181 History of falling: Secondary | ICD-10-CM | POA: Diagnosis not present

## 2023-12-15 DIAGNOSIS — S32482D Displaced dome fracture of left acetabulum, subsequent encounter for fracture with routine healing: Secondary | ICD-10-CM | POA: Diagnosis not present

## 2023-12-17 ENCOUNTER — Encounter: Payer: Self-pay | Admitting: Adult Health

## 2023-12-17 ENCOUNTER — Non-Acute Institutional Stay (SKILLED_NURSING_FACILITY): Payer: Self-pay | Admitting: Adult Health

## 2023-12-17 DIAGNOSIS — F01B0 Vascular dementia, moderate, without behavioral disturbance, psychotic disturbance, mood disturbance, and anxiety: Secondary | ICD-10-CM | POA: Diagnosis not present

## 2023-12-17 DIAGNOSIS — I700 Atherosclerosis of aorta: Secondary | ICD-10-CM

## 2023-12-17 DIAGNOSIS — I482 Chronic atrial fibrillation, unspecified: Secondary | ICD-10-CM

## 2023-12-17 DIAGNOSIS — S32402G Unspecified fracture of left acetabulum, subsequent encounter for fracture with delayed healing: Secondary | ICD-10-CM | POA: Diagnosis not present

## 2023-12-17 DIAGNOSIS — M6281 Muscle weakness (generalized): Secondary | ICD-10-CM | POA: Diagnosis not present

## 2023-12-17 DIAGNOSIS — S32592D Other specified fracture of left pubis, subsequent encounter for fracture with routine healing: Secondary | ICD-10-CM | POA: Diagnosis not present

## 2023-12-17 DIAGNOSIS — S32402D Unspecified fracture of left acetabulum, subsequent encounter for fracture with routine healing: Secondary | ICD-10-CM | POA: Diagnosis not present

## 2023-12-17 DIAGNOSIS — Z9181 History of falling: Secondary | ICD-10-CM | POA: Diagnosis not present

## 2023-12-17 DIAGNOSIS — R262 Difficulty in walking, not elsewhere classified: Secondary | ICD-10-CM | POA: Diagnosis not present

## 2023-12-17 DIAGNOSIS — R279 Unspecified lack of coordination: Secondary | ICD-10-CM | POA: Diagnosis not present

## 2023-12-17 NOTE — Progress Notes (Signed)
 Location:  [facility] Room Number: 143 Place of Service:  SNF (31)   CODE STATUS: dnr   No Known Allergies  Chief Complaint  Patient presents with   Discharge Note    HPI:  She is being discharged to assisted living. She will need home health for pt/ot. Her medications will be provided by the assisted living. She will need to follow up with her medical provider. She was hospitalized after fall and suffering: comminuted fracture of left acetabulum involving both columns with multiple mildly displaced fragments; nondisplaced fracture of left inferior pubic ramus and right superior pubic ramus was also present. She was admitted to this facility for short term rehab: therapy: nonambulatory as she is unable to maintain toe-touch weight bearing due to cognitive impairment; upper body supervision; lower body contact guard; stand/pivot contact guard; no steps brp contact guard.   Past Medical History:  Diagnosis Date   Arthritis    AV malformation of gastrointestinal tract    in cecum she denies history of GIB   Breast mass, right    Cataract    CVA (cerebral vascular accident) (HCC) 2007   Dementia (HCC)    Dizziness    denies this as an ongoing problem   Hyperlipidemia    Microcytic anemia    Osteopenia    Persistent atrial fibrillation (HCC)    s/p PVI 04/2010  CHADS2vasc at least 5   RLS (restless legs syndrome)    Sinus bradycardia     Past Surgical History:  Procedure Laterality Date   afib ablation  04/2010   PVI with CTI ablation performed by [facility]   BREAST EXCISIONAL BIOPSY Right    benign cyst   breast mass resected     CARDIOVERSION     for afib   CATARACT EXTRACTION W/PHACO  11/11/2011   Procedure: CATARACT EXTRACTION PHACO AND INTRAOCULAR LENS PLACEMENT (IOC);  Surgeon: Anner Kill, MD;  Location: [facility];  Service: Ophthalmology;  Laterality: Left;  CDE:12.62   CATARACT EXTRACTION W/PHACO  11/25/2011   Procedure: CATARACT EXTRACTION PHACO AND  INTRAOCULAR LENS PLACEMENT (IOC);  Surgeon: Anner Kill, MD;  Location: [facility];  Service: Ophthalmology;  Laterality: Right;  CDE 16.14   dental inplants      Social History   Socioeconomic History   Marital status: Widowed    Spouse name: Not on file   Number of children: 0   Years of education: college   Highest education level: Not on file  Occupational History   Occupation: retired  Tobacco Use   Smoking status: Former    Current packs/day: 0.00    Average packs/day: 1 pack/day for 30.0 years (30.0 ttl pk-yrs)    Types: Cigarettes    Start date: 08/05/1948    Quit date: 08/05/1978    Years since quitting: 45.3   Smokeless tobacco: Never   Tobacco comments:    quit in 1980s  Vaping Use   Vaping status: Never Used  Substance and Sexual Activity   Alcohol use: No   Drug use: No   Sexual activity: Never  Other Topics Concern   Not on file  Social History Narrative   Patient is retired and lives at [facility]    Cousin Midland and his wife Ron Cobbs are her closest living relatives.  They are who will make medical decisions for her if she is unable.   Patient has business college education. Right handed.    Caffeine- some times - soda  Daily exercise at [facility] - walks a mile a day at home.    Church every Sunday   Likes to go on trips with the rec center   Wears hearing aids/ dentures   Social Drivers of Health   Financial Resource Strain: Low Risk  (06/21/2022)   Overall Financial Resource Strain (CARDIA)    Difficulty of Paying Living Expenses: Not hard at all  Food Insecurity: No Food Insecurity (11/02/2023)   Hunger Vital Sign    Worried About Running Out of Food in the Last Year: Never true    Ran Out of Food in the Last Year: Never true  Transportation Needs: No Transportation Needs (11/02/2023)   PRAPARE - Administrator, Civil Service (Medical): No    Lack of Transportation (Non-Medical): No  Physical Activity: Sufficiently Active (06/21/2022)    Exercise Vital Sign    Days of Exercise per Week: 5 days    Minutes of Exercise per Session: 60 min  Stress: No Stress Concern Present (06/21/2022)   [employer] of Occupational Health - Occupational Stress Questionnaire    Feeling of Stress : Not at all  Social Connections: Moderately Integrated (11/02/2023)   Social Connection and Isolation Panel [NHANES]    Frequency of Communication with Friends and Family: Three times a week    Frequency of Social Gatherings with Friends and Family: Three times a week    Attends Religious Services: More than 4 times per year    Active Member of Clubs or Organizations: Yes    Attends Banker Meetings: More than 4 times per year    Marital Status: Widowed  Intimate Partner Violence: Not At Risk (11/02/2023)   Humiliation, Afraid, Rape, and Kick questionnaire    Fear of Current or Ex-Partner: No    Emotionally Abused: No    Physically Abused: No    Sexually Abused: No   Family History  Problem Relation Age of Onset   Stroke Mother 81   Hip fracture Mother    Lung cancer Father 56   Anesthesia problems Neg Hx    Hypotension Neg Hx    Malignant hyperthermia Neg Hx    Pseudochol deficiency Neg Hx    Breast cancer Neg Hx       VITAL SIGNS BP (!) 101/52   Pulse 65   Temp 98.1 F (36.7 C)   Resp 17   Ht 5\' 3"  (1.6 m)   Wt 126 lb 12.8 oz (57.5 kg)   SpO2 94%   BMI 22.46 kg/m   Outpatient Encounter Medications as of 12/17/2023  Medication Sig   acetaminophen  (TYLENOL ) 650 MG CR tablet Take 650 mg by mouth every 8 (eight) hours.   Ascorbic Acid (VITAMIN C) 500 MG CAPS Take 500 mg by mouth daily.   atorvastatin  (LIPITOR) 10 MG tablet TAKE 1/2 TABLET BY MOUTH ONCE A DAY OR AS INSTRUCTED BY PHYSICIAN (Patient taking differently: Take 5 mg by mouth daily. TAKE 1/2 TABLET BY MOUTH ONCE A DAY OR AS INSTRUCTED BY PHYSICIAN)   diltiazem  (CARDIZEM  CD) 180 MG 24 hr capsule TAKE 1 CAPSULE BY MOUTH EVERY DAY   feeding supplement  (ENSURE ENLIVE / ENSURE PLUS) LIQD Take 237 mLs by mouth 2 (two) times daily between meals.   ferrous sulfate 325 (65 FE) MG EC tablet Take 325 mg by mouth 3 (three) times a week.   folic acid (FOLVITE) 800 MCG tablet Take 400 mcg by mouth daily.   Magnesium Oxide -Mg  Supplement 500 MG TABS Take 1 tablet by mouth daily.   melatonin 5 MG TABS Take 5 mg by mouth at bedtime.   meloxicam (MOBIC) 7.5 MG tablet Take 7.5 mg by mouth daily.   memantine  (NAMENDA ) 10 MG tablet Take 1 tablet (10 mg total) by mouth 2 (two) times daily.   methocarbamol  (ROBAXIN ) 500 MG tablet Take 1 tablet (500 mg total) by mouth every 6 (six) hours as needed for muscle spasms.   Multiple Vitamin (MULTIVITAMIN) capsule Take 1 capsule by mouth daily.     polyethylene glycol (MIRALAX  / GLYCOLAX ) 17 g packet Take 17 g by mouth daily as needed for mild constipation. (Patient taking differently: Take 17 g by mouth daily.)   Protein (PROSOURCE PO) Take 30 mLs by mouth 3 (three) times daily.   Rivaroxaban  (XARELTO ) 15 MG TABS tablet TAKE 1 TABLET (15 MG TOTAL) BY MOUTH DAILY.   [DISCONTINUED] Calcium  Carbonate (CALCIUM  500 PO) Take 1 tablet by mouth daily.     No facility-administered encounter medications on file as of 12/17/2023.     SIGNIFICANT DIAGNOSTIC EXAMS  PREVIOUS   11-01-23: wbc 16.6; hgb 14.0; hct 44.1; mcv 995.9 plt 253; glucose 125; bun 26; creat 1.18; k+ 4.0; na++ 136; ca 9.9; gfr 44 11-05-23: wbc 13.1; hgb 11.0; hct 33.1; mcv 90.9 plt 162; glucose 105; bun 27; creat 1.07; k+ 4.1; na++ 131; ca 8.8; gfr 49   TODAY  11-13-23: wbc 12.1; hgb 10.1; hct 30.6; mcv 93.3 plt 457; glucose 90; bun 18; creat 0.86; k + 4.0; na++ 132; c 8.6 gfr >60; alk phos 135; protein 6.3 albumin 2.7   Review of Systems  Constitutional:  Negative for malaise/fatigue.  Respiratory:  Negative for cough and shortness of breath.   Cardiovascular:  Negative for chest pain, palpitations and leg swelling.  Gastrointestinal:  Negative for  abdominal pain, constipation and heartburn.  Musculoskeletal:  Negative for back pain, joint pain and myalgias.  Skin: Negative.   Neurological:  Negative for dizziness.  Psychiatric/Behavioral:  The patient is not nervous/anxious.    Physical Exam Constitutional:      General: She is not in acute distress.    Appearance: She is well-developed. She is not diaphoretic.  Neck:     Thyroid : No thyromegaly.  Cardiovascular:     Rate and Rhythm: Normal rate and regular rhythm.     Heart sounds: Normal heart sounds.  Pulmonary:     Effort: Pulmonary effort is normal. No respiratory distress.     Breath sounds: Normal breath sounds.  Abdominal:     General: Bowel sounds are normal. There is no distension.     Palpations: Abdomen is soft.     Tenderness: There is no abdominal tenderness.  Musculoskeletal:        General: Normal range of motion.     Cervical back: Neck supple.     Right lower leg: No edema.     Left lower leg: No edema.  Lymphadenopathy:     Cervical: No cervical adenopathy.  Skin:    General: Skin is warm and dry.  Neurological:     Mental Status: She is alert. Mental status is at baseline.  Psychiatric:        Mood and Affect: Mood normal.      ASSESSMENT/ PLAN:   Patient is being discharged with the following home health services:  pt/ot to evaluate and treat as indicated for gait balance strength adl training.   Patient is being discharged with  the following durable medical equipment:  none needed   Patient has been advised to f/u with their PCP in 1-2 weeks to for a transitions of care visit.  Social services at their facility was responsible for arranging this appointment.  Pt was provided with adequate prescriptions of noncontrolled medications to reach the scheduled appointment .  For controlled substances, a limited supply was provided as appropriate for the individual patient.  If the pt normally receives these medications from a pain clinic or has a  contract with another physician, these medications should be received from that clinic or physician only).    Medications will be provided by the assisted living.   Britt Candle NP [facility]   call [phone]

## 2023-12-22 ENCOUNTER — Encounter: Payer: Self-pay | Admitting: Adult Health

## 2023-12-22 NOTE — Progress Notes (Signed)
 Location:  [facility] Room Number: 143 Place of Service:  SNF (31)   CODE STATUS: dnr   No Known Allergies  Chief Complaint  Patient presents with  . Discharge Note    HPI:    Past Medical History:  Diagnosis Date  . Arthritis   . AV malformation of gastrointestinal tract    in cecum she denies history of GIB  . Breast mass, right   . Cataract   . CVA (cerebral vascular accident) (HCC) 2007  . Dementia (HCC)   . Dizziness    denies this as an ongoing problem  . Hyperlipidemia   . Microcytic anemia   . Osteopenia   . Persistent atrial fibrillation (HCC)    s/p PVI 04/2010  CHADS2vasc at least 5  . RLS (restless legs syndrome)   . Sinus bradycardia     Past Surgical History:  Procedure Laterality Date  . afib ablation  04/2010   PVI with CTI ablation performed by JA  . BREAST EXCISIONAL BIOPSY Right    benign cyst  . breast mass resected    . CARDIOVERSION     for afib  . CATARACT EXTRACTION W/PHACO  11/11/2011   Procedure: CATARACT EXTRACTION PHACO AND INTRAOCULAR LENS PLACEMENT (IOC);  Surgeon: Anner Kill, MD;  Location: [facility];  Service: Ophthalmology;  Laterality: Left;  CDE:12.62  . CATARACT EXTRACTION W/PHACO  11/25/2011   Procedure: CATARACT EXTRACTION PHACO AND INTRAOCULAR LENS PLACEMENT (IOC);  Surgeon: Anner Kill, MD;  Location: [facility];  Service: Ophthalmology;  Laterality: Right;  CDE 16.14  . dental inplants      Social History   Socioeconomic History  . Marital status: Widowed    Spouse name: Not on file  . Number of children: 0  . Years of education: college  . Highest education level: Not on file  Occupational History  . Occupation: retired  Tobacco Use  . Smoking status: Former    Current packs/day: 0.00    Average packs/day: 1 pack/day for 30.0 years (30.0 ttl pk-yrs)    Types: Cigarettes    Start date: 08/05/1948    Quit date: 08/05/1978    Years since quitting: 45.4  . Smokeless tobacco: Never  . Tobacco  comments:    quit in 1980s  Vaping Use  . Vaping status: Never Used  Substance and Sexual Activity  . Alcohol use: No  . Drug use: No  . Sexual activity: Never  Other Topics Concern  . Not on file  Social History Narrative   Patient is retired and lives at [facility]    Cousin Lindley Rhein and his wife Ron Cobbs are her closest living relatives.  They are who will make medical decisions for her if she is unable.   Patient has business college education. Right handed.    Caffeine- some times - soda    Daily exercise at [facility] - walks a mile a day at home.    Church every Sunday   Likes to go on trips with the rec [place]   Wears hearing aids/ dentures   Social Drivers of Health   Financial Resource Strain: Low Risk  (06/21/2022)   Overall Financial Resource Strain (CARDIA)   . Difficulty of Paying Living Expenses: Not hard at all  Food Insecurity: No Food Insecurity (11/02/2023)   Hunger Vital Sign   . Worried About Programme researcher, broadcasting/film/video in the Last Year: Never true   . Ran Out of Food in the  Last Year: Never true  Transportation Needs: No Transportation Needs (11/02/2023)   PRAPARE - Transportation   . Lack of Transportation (Medical): No   . Lack of Transportation (Non-Medical): No  Physical Activity: Sufficiently Active (06/21/2022)   Exercise Vital Sign   . Days of Exercise per Week: 5 days   . Minutes of Exercise per Session: 60 min  Stress: No Stress Concern Present (06/21/2022)   [employer] of Occupational Health - Occupational Stress Questionnaire   . Feeling of Stress : Not at all  Social Connections: Moderately Integrated (11/02/2023)   Social Connection and Isolation Panel [NHANES]   . Frequency of Communication with Friends and Family: Three times a week   . Frequency of Social Gatherings with Friends and Family: Three times a week   . Attends Religious Services: More than 4 times per year   . Active Member of Clubs or Organizations: Yes   . Attends Tax inspector Meetings: More than 4 times per year   . Marital Status: Widowed  Intimate Partner Violence: Not At Risk (11/02/2023)   Humiliation, Afraid, Rape, and Kick questionnaire   . Fear of Current or Ex-Partner: No   . Emotionally Abused: No   . Physically Abused: No   . Sexually Abused: No   Family History  Problem Relation Age of Onset  . Stroke Mother 25  . Hip fracture Mother   . Lung cancer Father 60  . Anesthesia problems Neg Hx   . Hypotension Neg Hx   . Malignant hyperthermia Neg Hx   . Pseudochol deficiency Neg Hx   . Breast cancer Neg Hx       VITAL SIGNS BP 130/64   Pulse 74   Temp 98.1 F (36.7 C)   Resp 18   Ht 5\' 3"  (1.6 m)   Wt 126 lb 12.8 oz (57.5 kg)   SpO2 97%   BMI 22.46 kg/m   Outpatient Encounter Medications as of 12/22/2023  Medication Sig  . acetaminophen  (TYLENOL ) 650 MG CR tablet Take 650 mg by mouth every 8 (eight) hours.  . Ascorbic Acid (VITAMIN C) 500 MG CAPS Take 500 mg by mouth daily.  . atorvastatin  (LIPITOR) 10 MG tablet TAKE 1/2 TABLET BY MOUTH ONCE A DAY OR AS INSTRUCTED BY PHYSICIAN (Patient taking differently: Take 5 mg by mouth daily. TAKE 1/2 TABLET BY MOUTH ONCE A DAY OR AS INSTRUCTED BY PHYSICIAN)  . diltiazem  (CARDIZEM  CD) 180 MG 24 hr capsule TAKE 1 CAPSULE BY MOUTH EVERY DAY  . feeding supplement (ENSURE ENLIVE / ENSURE PLUS) LIQD Take 237 mLs by mouth 2 (two) times daily between meals.  . ferrous sulfate 325 (65 FE) MG EC tablet Take 325 mg by mouth 3 (three) times a week.  . folic acid (FOLVITE) 800 MCG tablet Take 400 mcg by mouth daily.  . Magnesium Oxide -Mg Supplement 500 MG TABS Take 1 tablet by mouth daily.  . melatonin 5 MG TABS Take 5 mg by mouth at bedtime.  . meloxicam (MOBIC) 7.5 MG tablet Take 7.5 mg by mouth daily.  . memantine  (NAMENDA ) 10 MG tablet Take 1 tablet (10 mg total) by mouth 2 (two) times daily.  . methocarbamol  (ROBAXIN ) 500 MG tablet Take 1 tablet (500 mg total) by mouth every 6 (six) hours as  needed for muscle spasms.  . Multiple Vitamin (MULTIVITAMIN) capsule Take 1 capsule by mouth daily.    . polyethylene glycol (MIRALAX  / GLYCOLAX ) 17 g packet Take 17 g by  mouth daily as needed for mild constipation. (Patient taking differently: Take 17 g by mouth daily.)  . Protein (PROSOURCE PO) Take 30 mLs by mouth 3 (three) times daily.  . Rivaroxaban  (XARELTO ) 15 MG TABS tablet TAKE 1 TABLET (15 MG TOTAL) BY MOUTH DAILY.  . [DISCONTINUED] Calcium  Carbonate (CALCIUM  500 PO) Take 1 tablet by mouth daily.     No facility-administered encounter medications on file as of 12/22/2023.     SIGNIFICANT DIAGNOSTIC EXAMS       ASSESSMENT/ PLAN:   Patient is being discharged with the following home health services:    Patient is being discharged with the following durable medical equipment:    Patient has been advised to f/u with their PCP in 1-2 weeks to for a transitions of care visit.  Social services at their facility was responsible for arranging this appointment.  Pt was provided with adequate prescriptions of noncontrolled medications to reach the scheduled appointment .  For controlled substances, a limited supply was provided as appropriate for the individual patient.  If the pt normally receives these medications from a pain clinic or has a contract with another physician, these medications should be received from that clinic or physician only).      Britt Candle NP [facility]  call [phone]  This encounter was created in error - please disregard.

## 2023-12-23 DIAGNOSIS — F0150 Vascular dementia without behavioral disturbance: Secondary | ICD-10-CM | POA: Diagnosis not present

## 2023-12-23 DIAGNOSIS — S32402D Unspecified fracture of left acetabulum, subsequent encounter for fracture with routine healing: Secondary | ICD-10-CM | POA: Diagnosis not present

## 2023-12-23 DIAGNOSIS — Z8673 Personal history of transient ischemic attack (TIA), and cerebral infarction without residual deficits: Secondary | ICD-10-CM | POA: Diagnosis not present

## 2023-12-23 DIAGNOSIS — I4891 Unspecified atrial fibrillation: Secondary | ICD-10-CM | POA: Diagnosis not present

## 2023-12-23 DIAGNOSIS — N1831 Chronic kidney disease, stage 3a: Secondary | ICD-10-CM | POA: Diagnosis not present

## 2023-12-23 DIAGNOSIS — G4700 Insomnia, unspecified: Secondary | ICD-10-CM | POA: Diagnosis not present

## 2023-12-23 DIAGNOSIS — K5900 Constipation, unspecified: Secondary | ICD-10-CM | POA: Diagnosis not present

## 2023-12-23 DIAGNOSIS — D509 Iron deficiency anemia, unspecified: Secondary | ICD-10-CM | POA: Diagnosis not present

## 2023-12-23 DIAGNOSIS — S32519D Fracture of superior rim of unspecified pubis, subsequent encounter for fracture with routine healing: Secondary | ICD-10-CM | POA: Diagnosis not present

## 2023-12-24 ENCOUNTER — Other Ambulatory Visit: Payer: Self-pay

## 2023-12-24 ENCOUNTER — Emergency Department (HOSPITAL_COMMUNITY)

## 2023-12-24 ENCOUNTER — Emergency Department (HOSPITAL_COMMUNITY)
Admission: EM | Admit: 2023-12-24 | Discharge: 2023-12-24 | Discharge status: Absent without leave | Attending: Emergency Medicine | Admitting: Emergency Medicine

## 2023-12-24 ENCOUNTER — Emergency Department (HOSPITAL_COMMUNITY)
Admission: EM | Admit: 2023-12-24 | Discharge: 2023-12-24 | Disposition: A | Discharge status: Home / Self Care | Attending: Emergency Medicine | Admitting: Emergency Medicine

## 2023-12-24 ENCOUNTER — Encounter (HOSPITAL_COMMUNITY): Payer: Self-pay | Admitting: Emergency Medicine

## 2023-12-24 DIAGNOSIS — N189 Chronic kidney disease, unspecified: Secondary | ICD-10-CM | POA: Diagnosis not present

## 2023-12-24 DIAGNOSIS — W19XXXA Unspecified fall, initial encounter: Secondary | ICD-10-CM | POA: Insufficient documentation

## 2023-12-24 DIAGNOSIS — I517 Cardiomegaly: Secondary | ICD-10-CM | POA: Diagnosis not present

## 2023-12-24 DIAGNOSIS — R590 Localized enlarged lymph nodes: Secondary | ICD-10-CM | POA: Diagnosis not present

## 2023-12-24 DIAGNOSIS — I700 Atherosclerosis of aorta: Secondary | ICD-10-CM | POA: Diagnosis not present

## 2023-12-24 DIAGNOSIS — Y92129 Unspecified place in nursing home as the place of occurrence of the external cause: Secondary | ICD-10-CM | POA: Diagnosis not present

## 2023-12-24 DIAGNOSIS — S0181XA Laceration without foreign body of other part of head, initial encounter: Secondary | ICD-10-CM | POA: Diagnosis not present

## 2023-12-24 DIAGNOSIS — Z7401 Bed confinement status: Secondary | ICD-10-CM | POA: Diagnosis not present

## 2023-12-24 DIAGNOSIS — S0993XA Unspecified injury of face, initial encounter: Secondary | ICD-10-CM | POA: Diagnosis present

## 2023-12-24 DIAGNOSIS — Z7901 Long term (current) use of anticoagulants: Secondary | ICD-10-CM | POA: Insufficient documentation

## 2023-12-24 DIAGNOSIS — I6782 Cerebral ischemia: Secondary | ICD-10-CM | POA: Diagnosis not present

## 2023-12-24 DIAGNOSIS — Z043 Encounter for examination and observation following other accident: Secondary | ICD-10-CM | POA: Diagnosis not present

## 2023-12-24 DIAGNOSIS — S3210XD Unspecified fracture of sacrum, subsequent encounter for fracture with routine healing: Secondary | ICD-10-CM | POA: Diagnosis not present

## 2023-12-24 DIAGNOSIS — R531 Weakness: Secondary | ICD-10-CM | POA: Diagnosis not present

## 2023-12-24 DIAGNOSIS — F0390 Unspecified dementia without behavioral disturbance: Secondary | ICD-10-CM | POA: Diagnosis not present

## 2023-12-24 DIAGNOSIS — T148XXA Other injury of unspecified body region, initial encounter: Secondary | ICD-10-CM

## 2023-12-24 DIAGNOSIS — S01112A Laceration without foreign body of left eyelid and periocular area, initial encounter: Secondary | ICD-10-CM | POA: Insufficient documentation

## 2023-12-24 DIAGNOSIS — S32422D Displaced fracture of posterior wall of left acetabulum, subsequent encounter for fracture with routine healing: Secondary | ICD-10-CM | POA: Diagnosis not present

## 2023-12-24 DIAGNOSIS — S32592D Other specified fracture of left pubis, subsequent encounter for fracture with routine healing: Secondary | ICD-10-CM | POA: Diagnosis not present

## 2023-12-24 DIAGNOSIS — R9089 Other abnormal findings on diagnostic imaging of central nervous system: Secondary | ICD-10-CM | POA: Diagnosis not present

## 2023-12-24 DIAGNOSIS — M25552 Pain in left hip: Secondary | ICD-10-CM | POA: Diagnosis not present

## 2023-12-24 DIAGNOSIS — J811 Chronic pulmonary edema: Secondary | ICD-10-CM | POA: Diagnosis not present

## 2023-12-24 DIAGNOSIS — S32591D Other specified fracture of right pubis, subsequent encounter for fracture with routine healing: Secondary | ICD-10-CM | POA: Diagnosis not present

## 2023-12-24 DIAGNOSIS — C771 Secondary and unspecified malignant neoplasm of intrathoracic lymph nodes: Secondary | ICD-10-CM | POA: Diagnosis not present

## 2023-12-24 DIAGNOSIS — S32402A Unspecified fracture of left acetabulum, initial encounter for closed fracture: Secondary | ICD-10-CM | POA: Diagnosis not present

## 2023-12-24 LAB — BASIC METABOLIC PANEL WITH GFR
Anion gap: 8 (ref 5–15)
BUN: 17 mg/dL (ref 8–23)
CO2: 24 mmol/L (ref 22–32)
Calcium: 9.6 mg/dL (ref 8.9–10.3)
Chloride: 105 mmol/L (ref 98–111)
Creatinine, Ser: 0.9 mg/dL (ref 0.44–1.00)
GFR, Estimated: >60 mL/min (ref >60)
Glucose, Bld: 98 mg/dL (ref 70–99)
Potassium: 4.1 mmol/L (ref 3.5–5.1)
Sodium: 137 mmol/L (ref 135–145)

## 2023-12-24 LAB — CBC WITH DIFFERENTIAL/PLATELET
Abs Immature Granulocytes: 0.04 10*3/uL (ref 0.00–0.07)
Basophils Absolute: 0.1 10*3/uL (ref 0.0–0.1)
Basophils Relative: 1 %
Eosinophils Absolute: 0.1 10*3/uL (ref 0.0–0.5)
Eosinophils Relative: 1 %
HCT: 39 % (ref 36.0–46.0)
Hemoglobin: 12.8 g/dL (ref 12.0–15.0)
Immature Granulocytes: 0 %
Lymphocytes Relative: 18 %
Lymphs Abs: 1.8 10*3/uL (ref 0.7–4.0)
MCH: 31.3 pg (ref 26.0–34.0)
MCHC: 32.8 g/dL (ref 30.0–36.0)
MCV: 95.4 fL (ref 80.0–100.0)
Monocytes Absolute: 1.3 10*3/uL — ABNORMAL HIGH (ref 0.1–1.0)
Monocytes Relative: 13 %
Neutro Abs: 7.1 10*3/uL (ref 1.7–7.7)
Neutrophils Relative %: 67 %
Platelets: 329 10*3/uL (ref 150–400)
RBC: 4.09 MIL/uL (ref 3.87–5.11)
RDW: 14.6 % (ref 11.5–15.5)
WBC: 10.4 10*3/uL (ref 4.0–10.5)
nRBC: 0 % (ref 0.0–0.2)

## 2023-12-24 MED ORDER — LIDOCAINE-EPINEPHRINE (PF) 2 %-1:200000 IJ SOLN
10.0000 mL | Freq: Once | INTRAMUSCULAR | Status: DC
  Filled 2023-12-24: qty 20

## 2023-12-24 NOTE — ED Notes (Signed)
 Nt called [facility]cmd @3 :24am

## 2023-12-24 NOTE — ED Triage Notes (Signed)
 Pt presents to the ED via [facility] from [facility] nursing facility with complaints of an unwitnessed fall. Pt believes she slipped and hit the L side of her face. Pt with lac to the L eyebrow and L cheek - bleeding controlled. Pt states she takes a blood thinner, however none listed on her med rec. A&Ox4 at this time. Denies LOC, dizziness, N/V.

## 2023-12-24 NOTE — ED Provider Notes (Signed)
 [place] EMERGENCY DEPARTMENT AT [facility] Provider Note   CSN: [id number] Arrival date & time: 12/24/23  0422     History  Chief Complaint  Patient presents with   [name] is a 89 y.o. female who presents to the emergency department with a chief complaint of unwitnessed fall at nursing facility.  Patient believes she slipped and hit the left side of her face.  Patient with laceration to the left eyebrow and left cheek, bleeding controlled.  Patient states she is on a blood thinner at home, none listed on her medical record however.  Denies also consciousness, dizziness, nausea, vomiting.  Patient also experiencing mild left hip pain with reduced range of motion.  Patient ambulatory at baseline without assistance.  Patient has a past medical history significant for stroke, moderate dementia without behavioral disturbance, CKD, chronic atrial fibrillation with RVR, iron deficiency anemia, hyperlipidemia.  Patient previously seen in the emergency department on 11/01/2023 for a closed left acetabular fracture, patient was admitted and orthopedics was consulted.  Orthopedics recommended nonsurgical intervention.   Fall Pertinent negatives include no chest pain, no abdominal pain and no shortness of breath.       Home Medications Prior to Admission medications   Medication Sig Start Date End Date Taking? Authorizing Provider  acetaminophen  (TYLENOL ) 650 MG CR tablet Take 650 mg by mouth every 8 (eight) hours.    [provider]  Ascorbic Acid (VITAMIN C) 500 MG CAPS Take 500 mg by mouth daily.    [provider]  atorvastatin  (LIPITOR) 10 MG tablet TAKE 1/2 TABLET BY MOUTH ONCE A DAY OR AS INSTRUCTED BY PHYSICIAN Patient taking differently: Take 5 mg by mouth daily. TAKE 1/2 TABLET BY MOUTH ONCE A DAY OR AS INSTRUCTED BY PHYSICIAN 10/17/22   Vicky Grange M, DO  diltiazem  (CARDIZEM  CD) 180 MG 24 hr capsule TAKE 1 CAPSULE BY MOUTH  EVERY DAY 10/16/22   Gottschalk, Ashly M, DO  feeding supplement (ENSURE ENLIVE / ENSURE PLUS) LIQD Take 237 mLs by mouth 2 (two) times daily between meals. 11/05/23   Sheikh, Omair Latif, DO  ferrous sulfate 325 (65 FE) MG EC tablet Take 325 mg by mouth 3 (three) times a week.    [provider]  folic acid (FOLVITE) 800 MCG tablet Take 400 mcg by mouth daily.    [provider]  Magnesium Oxide -Mg Supplement 500 MG TABS Take 1 tablet by mouth daily. 10/09/23   [provider]  melatonin 5 MG TABS Take 5 mg by mouth at bedtime.    [provider]  meloxicam (MOBIC) 7.5 MG tablet Take 7.5 mg by mouth daily.    [provider]  memantine  (NAMENDA ) 10 MG tablet Take 1 tablet (10 mg total) by mouth 2 (two) times daily. 06/23/23   Phebe Brasil, MD  methocarbamol  (ROBAXIN ) 500 MG tablet Take 1 tablet (500 mg total) by mouth every 6 (six) hours as needed for muscle spasms. 11/05/23   Aura Leeds Latif, DO  Multiple Vitamin (MULTIVITAMIN) capsule Take 1 capsule by mouth daily.      [provider]  polyethylene glycol (MIRALAX  / GLYCOLAX ) 17 g packet Take 17 g by mouth daily as needed for mild constipation. Patient taking differently: Take 17 g by mouth daily. 11/05/23   Aura Leeds Latif, DO  Protein (PROSOURCE PO) Take 30 mLs by mouth 3 (three) times daily.    [provider]  Rivaroxaban  (XARELTO ) 15  MG TABS tablet TAKE 1 TABLET (15 MG TOTAL) BY MOUTH DAILY. 04/14/23   Vicky Grange M, DO  Calcium  Carbonate (CALCIUM  500 PO) Take 1 tablet by mouth daily.    10/30/11  [provider]      Allergies    Patient has no known allergies.    Review of Systems   Review of Systems  Constitutional:  Negative for chills and fever.  Respiratory:  Negative for chest tightness and shortness of breath.   Cardiovascular:  Negative for chest pain.  Gastrointestinal:  Negative for abdominal distention, abdominal pain, diarrhea, nausea and vomiting.   Musculoskeletal:  Positive for arthralgias and myalgias. Negative for neck pain and neck stiffness.  Skin:  Positive for wound (Laceration of L eyelid, abrasion L cheek).  Neurological:  Negative for dizziness, seizures and syncope.    Physical Exam Updated Vital Signs BP (!) 126/90 (BP Location: [name] Arm)   Pulse 99   Temp 98 F (36.7 C) (Oral)   Resp 19   Ht 5\' 3"  (1.6 m)   Wt 57.6 kg   SpO2 94%   BMI 22.49 kg/m  Physical Exam Vitals and nursing note reviewed.  Constitutional:      General: She is awake. She is not in acute distress.    Appearance: Normal appearance. She is not ill-appearing, toxic-appearing or diaphoretic.  HENT:     Head: Normocephalic. Abrasion, contusion and laceration present. No raccoon eyes or Battle's sign.      Comments: Laceration above L eyelid, abrasion of L cheek Eyes:     Extraocular Movements: Extraocular movements intact.     Pupils: Pupils are equal, round, and reactive to light.  Cardiovascular:     Rate and Rhythm: Normal rate. Rhythm irregular.  Pulmonary:     Effort: Pulmonary effort is normal. No respiratory distress.     Breath sounds: Normal breath sounds. No wheezing, rhonchi or rales.  Abdominal:     General: Abdomen is flat. There is no distension.     Palpations: Abdomen is soft.     Tenderness: There is no abdominal tenderness. There is no guarding.  Musculoskeletal:     Cervical back: No tenderness.     Left hip: Tenderness and bony tenderness present. Decreased range of motion.     Comments: L hip decreased ROM on exam, patient denies pain with active ROM but states that her hip "feels stiff". Pain with palpation of the L hip joint, L leg shortened and internally rotated.   Skin:    General: Skin is warm and dry.     Capillary Refill: Capillary refill takes less than 2 seconds.  Neurological:     Mental Status: She is alert.     Gait: Gait abnormal (patient ambulatory at baseline, not able to walk currently).      Comments: Patient has history of dementia at baseline  Psychiatric:        Mood and Affect: Mood normal.        Behavior: Behavior normal. Behavior is cooperative.     ED Results / Procedures / Treatments   Labs (all labs ordered are listed, but only abnormal results are displayed) Labs Reviewed - No data to display   EKG None  Radiology CT PELVIS WO CONTRAST Result Date: 12/24/2023 CLINICAL DATA:  Hip trauma with fracture suspected EXAM: CT PELVIS WITHOUT CONTRAST TECHNIQUE: Multidetector CT imaging of the pelvis was performed following the standard protocol without intravenous contrast. RADIATION DOSE REDUCTION: This exam was performed  according to the departmental dose-optimization program which includes automated exposure control, adjustment of the mA and/or kV according to patient size and/or use of iterative reconstruction technique. COMPARISON:  11/01/2023 FINDINGS: Comminuted fracturing of the left acetabulum involving the roof and posterior wall with upward displacement of the articular surface compared to the [name]. These areas show callus consistent with healing. No interval displacement. Sacral insufficiency fracture on the [name] which is nondisplaced and more apparent due to healing. Healing [name] pubic body and left inferior ramus fractures, essentially nondisplaced. No detected femur fracture. Improved soft tissue swelling. IMPRESSION: Unchanged alignment of healing left acetabular, left inferior ramus, [name] pubic body, and [name] sacral ala fractures. No new injury when compared to 11/01/2023. Electronically Signed   By: Ronnette Coke M.D.   On: 12/24/2023 06:39   DG HIP UNILAT W OR W/O PELVIS 2-3 VIEWS LEFT Result Date: 12/24/2023 CLINICAL DATA:  Status post fall.  Patient is on blood thinners. EXAM: DG HIP (WITH OR WITHOUT PELVIS) 2-3V LEFT COMPARISON:  None Available. FINDINGS: Fracture dislocation involving the left hip, left acetabulum, and left inferior pubic rami.  Fracture appears to involve superomedial wall of the left acetabulum and left inferior pubic rami with relative superior displacement of the femoral head with respect to the inferior acetabulum. Overlap of bony structures limits assessment of extent of fractures. [name] hip appears located and intact. IMPRESSION: Fracture dislocation involving the left hip, left acetabulum, and left inferior pubic rami. Recommend more definitive characterization with dedicated CT of the pelvis. Electronically Signed   By: Kimberley Penman M.D.   On: 12/24/2023 05:44   CT HEAD WO CONTRAST ( ) Result Date: 12/24/2023 CLINICAL DATA:  Unwitnessed fall EXAM: CT HEAD WITHOUT CONTRAST CT CERVICAL SPINE WITHOUT CONTRAST TECHNIQUE: Multidetector CT imaging of the head and cervical spine was performed following the standard protocol without intravenous contrast. Multiplanar CT image reconstructions of the cervical spine were also generated. RADIATION DOSE REDUCTION: This exam was performed according to the departmental dose-optimization program which includes automated exposure control, adjustment of the mA and/or kV according to patient size and/or use of iterative reconstruction technique. COMPARISON:  08/28/2023 FINDINGS: CT HEAD FINDINGS Brain: No evidence of acute infarction, hemorrhage, hydrocephalus, extra-axial collection or mass lesion/mass effect. Generalized volume loss. Mild periventricular chronic small vessel ischemia. Vascular: No hyperdense vessel or unexpected calcification. Skull: Forehead swelling on the left without acute fracture. Sinuses/Orbits: No acute finding. CT CERVICAL SPINE FINDINGS Alignment: No traumatic malalignment. Mild degenerative anterolisthesis at multiple levels. Skull base and vertebrae: No acute fracture. No primary bone lesion or focal pathologic process. Soft tissues and spinal canal: No prevertebral fluid or swelling. No visible canal hematoma. Cluster of enlarged lymph nodes at the [name] thoracic  inlet, up to 2.3 cm. Disc levels:  Ordinary degeneration. Upper chest: No acute finding. IMPRESSION: No evidence of acute intracranial or cervical spine injury. Concerning cluster of enlarged lymph nodes at the [name] thoracic inlet, recommend malignancy workup. Electronically Signed   By: Ronnette Coke M.D.   On: 12/24/2023 05:43   CT CERVICAL SPINE WO CONTRAST Result Date: 12/24/2023 CLINICAL DATA:  Unwitnessed fall EXAM: CT HEAD WITHOUT CONTRAST CT CERVICAL SPINE WITHOUT CONTRAST TECHNIQUE: Multidetector CT imaging of the head and cervical spine was performed following the standard protocol without intravenous contrast. Multiplanar CT image reconstructions of the cervical spine were also generated. RADIATION DOSE REDUCTION: This exam was performed according to the departmental dose-optimization program which includes automated exposure control, adjustment of the mA and/or kV according  to patient size and/or use of iterative reconstruction technique. COMPARISON:  08/28/2023 FINDINGS: CT HEAD FINDINGS Brain: No evidence of acute infarction, hemorrhage, hydrocephalus, extra-axial collection or mass lesion/mass effect. Generalized volume loss. Mild periventricular chronic small vessel ischemia. Vascular: No hyperdense vessel or unexpected calcification. Skull: Forehead swelling on the left without acute fracture. Sinuses/Orbits: No acute finding. CT CERVICAL SPINE FINDINGS Alignment: No traumatic malalignment. Mild degenerative anterolisthesis at multiple levels. Skull base and vertebrae: No acute fracture. No primary bone lesion or focal pathologic process. Soft tissues and spinal canal: No prevertebral fluid or swelling. No visible canal hematoma. Cluster of enlarged lymph nodes at the [name] thoracic inlet, up to 2.3 cm. Disc levels:  Ordinary degeneration. Upper chest: No acute finding. IMPRESSION: No evidence of acute intracranial or cervical spine injury. Concerning cluster of enlarged lymph nodes at the  [name] thoracic inlet, recommend malignancy workup. Electronically Signed   By: Ronnette Coke M.D.   On: 12/24/2023 05:43   DG Chest Portable 1 View Result Date: 12/24/2023 CLINICAL DATA:  Status post fall. EXAM: PORTABLE CHEST 1 VIEW COMPARISON:  11/01/2023 FINDINGS: Mild cardiac enlargement. Aortic atherosclerosis. No pleural effusion. Mild interstitial edema. No airspace consolidation. The visualized osseous structures appear intact. IMPRESSION: Mild interstitial edema. Electronically Signed   By: Kimberley Penman M.D.   On: 12/24/2023 05:37    Procedures .Laceration Repair  Date/Time: 12/24/2023 6:10 AM  Performed by: Fonda Hymen, PA-C Authorized by: Fonda Hymen, PA-C   Consent:    Consent obtained:  Verbal   Consent given by:  Patient   Risks, benefits, and alternatives were discussed: yes     Risks discussed:  Infection, pain and poor cosmetic result   Alternatives discussed:  No treatment and observation Universal protocol:    Procedure explained and questions answered to patient or proxy's satisfaction: yes     Patient identity confirmed:  Verbally with patient and arm band Anesthesia:    Anesthesia method:  Local infiltration   Local anesthetic:  Lidocaine  2% WITH epi Laceration details:    Location:  Face   Face location:  L eyebrow   Length (cm):  5 Treatment:    Area cleansed with:  Povidone-iodine  and saline   Amount of cleaning:  Standard   Irrigation solution:  Sterile saline   Visualized foreign bodies/material removed: yes   Skin repair:    Repair method:  Sutures   Suture size:  4-0   Suture material:  Prolene   Suture technique:  Simple interrupted   Number of sutures:  5 Approximation:    Approximation:  Close Repair type:    Repair type:  Simple Post-procedure details:    Dressing:  Open (no dressing)   Procedure completion:  Tolerated well, no immediate complications     Medications Ordered in ED Medications - No data to display  ED  Course/ Medical Decision Making/ A&P Clinical Course as of 12/24/23 0706  Wed Dec 24, 2023  0451 CLINICAL DATA:  Unwitnessed fall   EXAM: CT HEAD WITHOUT CONTRAST   CT CERVICAL SPINE WITHOUT CONTRAST   TECHNIQUE: Multidetector CT imaging of the head and cervical spine was performed following the standard protocol without intravenous contrast. Multiplanar CT image reconstructions of the cervical spine were also generated.   RADIATION DOSE REDUCTION: This exam was performed according to the departmental dose-optimization program which includes automated exposure control, adjustment of the mA and/or kV according to patient size and/or use of iterative reconstruction technique.   COMPARISON:  08/28/2023   FINDINGS: CT HEAD FINDINGS   Brain: No evidence of acute infarction, hemorrhage, hydrocephalus, extra-axial collection or mass lesion/mass effect. Generalized volume loss. Mild periventricular chronic small vessel ischemia.   Vascular: No hyperdense vessel or unexpected calcification.   Skull: Forehead swelling on the left without acute fracture.   Sinuses/Orbits: No acute finding.   CT CERVICAL SPINE FINDINGS   Alignment: No traumatic malalignment. Mild degenerative anterolisthesis at multiple levels.   Skull base and vertebrae: No acute fracture. No primary bone lesion or focal pathologic process.   Soft tissues and spinal canal: No prevertebral fluid or swelling. No visible canal hematoma. Cluster of enlarged lymph nodes at the [name] thoracic inlet, up to 2.3 cm.   Disc levels:  Ordinary degeneration.   Upper chest: No acute finding.   IMPRESSION: No evidence of acute intracranial or cervical spine injury.   Concerning cluster of enlarged lymph nodes at the [name] thoracic inlet, recommend malignancy workup.  [CC]    Clinical Course User Index [CC] Onetha Bile, MD    Patient presents to the ED for concern of fall,  this involves an extensive number  of treatment options, and is a complaint that carries with it a high risk of complications and morbidity.  The differential diagnosis includes skull fracture, brain bleed, facial fracture, hip fracture, rib fracture, other trauma/injury, cervical spine fracture, etc.    Co morbidities that complicate the patient evaluation  Nursing facility resident, not witness fall   Additional history obtained:  Additional history obtained from EMS and Nursing   External records from outside source obtained and reviewed including    Imaging Studies ordered:  I ordered imaging studies including CT head without contrast, CT cervical spine, chest xray, L hip xray  I independently visualized and interpreted imaging which showed:  CT head without contrast and CT cervical spine:  IMPRESSION:  No evidence of acute intracranial or cervical spine injury.    Concerning cluster of enlarged lymph nodes at the [name] thoracic  inlet, recommend malignancy workup.  Chest xray:  IMPRESSION:  Mild interstitial edema.  L hip xray: IMPRESSION:  Fracture dislocation involving the left hip, left acetabulum, and  left inferior pubic rami. Recommend more definitive characterization  with dedicated CT of the pelvis.  CT pelvis without contrast: PENDING RAD READ  I agree with the radiologist interpretation   Cardiac Monitoring:  The patient was maintained on a cardiac monitor.  I personally viewed and interpreted the cardiac monitored which showed an underlying rhythm of: irregular rhythm, A-fib   Medicines ordered and prescription drug management:  I ordered medication including lidocaine  for laceration repair Reevaluation of the patient after these medicines showed that the patient stayed the same I have reviewed the patients home medicines and have made adjustments as needed   Critical Interventions:  none   Problem List / ED Course:  Fall  Nursing home facility resident, non-witnessed  fall Patient has laceration over left eyelid as well as abrasion to left cheek On exam palpation of the left hip is painful, reduced range of motion, shortened and internally rotated, hx of previous fracture treated non-surgically  Imaging of head and cervical spine negative for acute fracture, radiologist recommend malignancy workup due to findings, patient educated about results Fracture dislocation of left hip visualized with portable xray Left eyelid laceration repaired with sutures  Recommend CT scan for further evaluation of left hip fracture/dislocation, fracture unchanged from previous injury, compared to CT from March, since then patient has  been nonambulatory based on records Patient ready for discharge Return precautions discussed, recommend f/u with PCP for malignancy work-up based on CT scan findings today   Reevaluation:  After the interventions noted above, I reevaluated the patient and found that they have :stayed the same   Social Determinants of Health:  Nursing home resident   Dispostion:  Patient discharged with directions to follow-up with PCP regarding today's CT findings.  Return precautions given.  Patient should follow-up with primary care within 48 hours for ongoing diagnosis and treatment.   Click here for ABCD2, HEART and other calculatorsREFRESH Note before signing :1}                              Medical Decision Making Amount and/or Complexity of Data Reviewed Labs: ordered. Radiology: ordered.         Final Clinical Impression(s) / ED Diagnoses Final diagnoses:  Fall, initial encounter  Facial laceration, initial encounter  Abrasion    Rx / DC Orders ED Discharge Orders     None         Fonda Hymen, PA-C 12/24/23 1062    Onetha Bile, MD 12/24/23 2258

## 2023-12-24 NOTE — Discharge Instructions (Addendum)
 It was a pleasure taking care of you today.  Today you were evaluated after your fall at your facility.  Based on your history, physical exam, labs, and imaging at this time we feel you are safe for discharge.  The previous fractures of your left hip are unchanged from March.  The laceration on your forehead was repaired using sutures.  The sutures will need to come out in 5-7 days, this can be done at your primary care office, urgent care, or back in the emergency department.  Please return to the emergency department if you experiencing the following symptoms including but not limited to nausea, vomiting, severe headache, severe pain, dizziness, unexplained weakness.  If you experience any signs of infection including fever, chills, redness, drainage please seek medical attention. Please follow-up with your primary care provider within 48 hours regarding the CT scan results below for further work-up:   IMPRESSION:  No evidence of acute intracranial or cervical spine injury.    Concerning cluster of enlarged lymph nodes at the right thoracic  inlet, recommend malignancy workup.

## 2023-12-24 NOTE — ED Notes (Signed)
[facility] CALLED  °

## 2023-12-25 DIAGNOSIS — Z9181 History of falling: Secondary | ICD-10-CM | POA: Diagnosis not present

## 2023-12-25 DIAGNOSIS — Z4801 Encounter for change or removal of surgical wound dressing: Secondary | ICD-10-CM | POA: Diagnosis not present

## 2023-12-25 DIAGNOSIS — S32519D Fracture of superior rim of unspecified pubis, subsequent encounter for fracture with routine healing: Secondary | ICD-10-CM | POA: Diagnosis not present

## 2023-12-25 DIAGNOSIS — S0083XD Contusion of other part of head, subsequent encounter: Secondary | ICD-10-CM | POA: Diagnosis not present

## 2023-12-25 DIAGNOSIS — S32402D Unspecified fracture of left acetabulum, subsequent encounter for fracture with routine healing: Secondary | ICD-10-CM | POA: Diagnosis not present

## 2023-12-25 DIAGNOSIS — S0181XD Laceration without foreign body of other part of head, subsequent encounter: Secondary | ICD-10-CM | POA: Diagnosis not present

## 2023-12-30 DIAGNOSIS — Z4802 Encounter for removal of sutures: Secondary | ICD-10-CM | POA: Diagnosis not present

## 2023-12-30 DIAGNOSIS — I10 Essential (primary) hypertension: Secondary | ICD-10-CM | POA: Diagnosis not present

## 2023-12-30 DIAGNOSIS — E782 Mixed hyperlipidemia: Secondary | ICD-10-CM | POA: Diagnosis not present

## 2023-12-30 DIAGNOSIS — E039 Hypothyroidism, unspecified: Secondary | ICD-10-CM | POA: Diagnosis not present

## 2023-12-30 DIAGNOSIS — Z9181 History of falling: Secondary | ICD-10-CM | POA: Diagnosis not present

## 2023-12-30 DIAGNOSIS — E559 Vitamin D deficiency, unspecified: Secondary | ICD-10-CM | POA: Diagnosis not present

## 2023-12-30 DIAGNOSIS — D519 Vitamin B12 deficiency anemia, unspecified: Secondary | ICD-10-CM | POA: Diagnosis not present

## 2023-12-30 DIAGNOSIS — S0181XD Laceration without foreign body of other part of head, subsequent encounter: Secondary | ICD-10-CM | POA: Diagnosis not present

## 2024-01-01 DIAGNOSIS — D631 Anemia in chronic kidney disease: Secondary | ICD-10-CM | POA: Diagnosis not present

## 2024-01-01 DIAGNOSIS — F0153 Vascular dementia, unspecified severity, with mood disturbance: Secondary | ICD-10-CM | POA: Diagnosis not present

## 2024-01-01 DIAGNOSIS — S32512D Fracture of superior rim of left pubis, subsequent encounter for fracture with routine healing: Secondary | ICD-10-CM | POA: Diagnosis not present

## 2024-01-01 DIAGNOSIS — F0390 Unspecified dementia without behavioral disturbance: Secondary | ICD-10-CM | POA: Diagnosis not present

## 2024-01-01 DIAGNOSIS — I4891 Unspecified atrial fibrillation: Secondary | ICD-10-CM | POA: Diagnosis not present

## 2024-01-01 DIAGNOSIS — F39 Unspecified mood [affective] disorder: Secondary | ICD-10-CM | POA: Diagnosis not present

## 2024-01-01 DIAGNOSIS — N182 Chronic kidney disease, stage 2 (mild): Secondary | ICD-10-CM | POA: Diagnosis not present

## 2024-01-01 DIAGNOSIS — G4700 Insomnia, unspecified: Secondary | ICD-10-CM | POA: Diagnosis not present

## 2024-01-01 DIAGNOSIS — E785 Hyperlipidemia, unspecified: Secondary | ICD-10-CM | POA: Diagnosis not present

## 2024-01-01 DIAGNOSIS — S32402D Unspecified fracture of left acetabulum, subsequent encounter for fracture with routine healing: Secondary | ICD-10-CM | POA: Diagnosis not present

## 2024-01-01 DIAGNOSIS — N1831 Chronic kidney disease, stage 3a: Secondary | ICD-10-CM | POA: Diagnosis not present

## 2024-01-01 DIAGNOSIS — D509 Iron deficiency anemia, unspecified: Secondary | ICD-10-CM | POA: Diagnosis not present

## 2024-01-05 DIAGNOSIS — S32482D Displaced dome fracture of left acetabulum, subsequent encounter for fracture with routine healing: Secondary | ICD-10-CM | POA: Diagnosis not present

## 2024-01-06 ENCOUNTER — Other Ambulatory Visit: Payer: Self-pay | Admitting: Internal Medicine

## 2024-01-08 DIAGNOSIS — S32402D Unspecified fracture of left acetabulum, subsequent encounter for fracture with routine healing: Secondary | ICD-10-CM | POA: Diagnosis not present

## 2024-01-08 DIAGNOSIS — N1831 Chronic kidney disease, stage 3a: Secondary | ICD-10-CM | POA: Diagnosis not present

## 2024-01-08 DIAGNOSIS — D631 Anemia in chronic kidney disease: Secondary | ICD-10-CM | POA: Diagnosis not present

## 2024-01-08 DIAGNOSIS — E785 Hyperlipidemia, unspecified: Secondary | ICD-10-CM | POA: Diagnosis not present

## 2024-01-08 DIAGNOSIS — S32512D Fracture of superior rim of left pubis, subsequent encounter for fracture with routine healing: Secondary | ICD-10-CM | POA: Diagnosis not present

## 2024-01-08 DIAGNOSIS — G4700 Insomnia, unspecified: Secondary | ICD-10-CM | POA: Diagnosis not present

## 2024-01-08 DIAGNOSIS — F0153 Vascular dementia, unspecified severity, with mood disturbance: Secondary | ICD-10-CM | POA: Diagnosis not present

## 2024-01-08 DIAGNOSIS — I4891 Unspecified atrial fibrillation: Secondary | ICD-10-CM | POA: Diagnosis not present

## 2024-01-08 DIAGNOSIS — D509 Iron deficiency anemia, unspecified: Secondary | ICD-10-CM | POA: Diagnosis not present

## 2024-01-09 DIAGNOSIS — F0153 Vascular dementia, unspecified severity, with mood disturbance: Secondary | ICD-10-CM | POA: Diagnosis not present

## 2024-01-09 DIAGNOSIS — I4891 Unspecified atrial fibrillation: Secondary | ICD-10-CM | POA: Diagnosis not present

## 2024-01-09 DIAGNOSIS — D509 Iron deficiency anemia, unspecified: Secondary | ICD-10-CM | POA: Diagnosis not present

## 2024-01-09 DIAGNOSIS — D631 Anemia in chronic kidney disease: Secondary | ICD-10-CM | POA: Diagnosis not present

## 2024-01-09 DIAGNOSIS — G4700 Insomnia, unspecified: Secondary | ICD-10-CM | POA: Diagnosis not present

## 2024-01-09 DIAGNOSIS — E785 Hyperlipidemia, unspecified: Secondary | ICD-10-CM | POA: Diagnosis not present

## 2024-01-09 DIAGNOSIS — S32402D Unspecified fracture of left acetabulum, subsequent encounter for fracture with routine healing: Secondary | ICD-10-CM | POA: Diagnosis not present

## 2024-01-09 DIAGNOSIS — S32512D Fracture of superior rim of left pubis, subsequent encounter for fracture with routine healing: Secondary | ICD-10-CM | POA: Diagnosis not present

## 2024-01-09 DIAGNOSIS — N1831 Chronic kidney disease, stage 3a: Secondary | ICD-10-CM | POA: Diagnosis not present

## 2024-01-15 DIAGNOSIS — N1831 Chronic kidney disease, stage 3a: Secondary | ICD-10-CM | POA: Diagnosis not present

## 2024-01-15 DIAGNOSIS — D509 Iron deficiency anemia, unspecified: Secondary | ICD-10-CM | POA: Diagnosis not present

## 2024-01-15 DIAGNOSIS — G4700 Insomnia, unspecified: Secondary | ICD-10-CM | POA: Diagnosis not present

## 2024-01-15 DIAGNOSIS — D631 Anemia in chronic kidney disease: Secondary | ICD-10-CM | POA: Diagnosis not present

## 2024-01-15 DIAGNOSIS — S32402D Unspecified fracture of left acetabulum, subsequent encounter for fracture with routine healing: Secondary | ICD-10-CM | POA: Diagnosis not present

## 2024-01-15 DIAGNOSIS — E785 Hyperlipidemia, unspecified: Secondary | ICD-10-CM | POA: Diagnosis not present

## 2024-01-15 DIAGNOSIS — I4891 Unspecified atrial fibrillation: Secondary | ICD-10-CM | POA: Diagnosis not present

## 2024-01-15 DIAGNOSIS — F0153 Vascular dementia, unspecified severity, with mood disturbance: Secondary | ICD-10-CM | POA: Diagnosis not present

## 2024-01-15 DIAGNOSIS — S32512D Fracture of superior rim of left pubis, subsequent encounter for fracture with routine healing: Secondary | ICD-10-CM | POA: Diagnosis not present

## 2024-01-20 DIAGNOSIS — F0150 Vascular dementia without behavioral disturbance: Secondary | ICD-10-CM | POA: Diagnosis not present

## 2024-01-20 DIAGNOSIS — N182 Chronic kidney disease, stage 2 (mild): Secondary | ICD-10-CM | POA: Diagnosis not present

## 2024-01-20 DIAGNOSIS — I4891 Unspecified atrial fibrillation: Secondary | ICD-10-CM | POA: Diagnosis not present

## 2024-01-20 DIAGNOSIS — D509 Iron deficiency anemia, unspecified: Secondary | ICD-10-CM | POA: Diagnosis not present

## 2024-01-22 DIAGNOSIS — F0153 Vascular dementia, unspecified severity, with mood disturbance: Secondary | ICD-10-CM | POA: Diagnosis not present

## 2024-01-22 DIAGNOSIS — D631 Anemia in chronic kidney disease: Secondary | ICD-10-CM | POA: Diagnosis not present

## 2024-01-22 DIAGNOSIS — G4700 Insomnia, unspecified: Secondary | ICD-10-CM | POA: Diagnosis not present

## 2024-01-22 DIAGNOSIS — S32512D Fracture of superior rim of left pubis, subsequent encounter for fracture with routine healing: Secondary | ICD-10-CM | POA: Diagnosis not present

## 2024-01-22 DIAGNOSIS — D509 Iron deficiency anemia, unspecified: Secondary | ICD-10-CM | POA: Diagnosis not present

## 2024-01-22 DIAGNOSIS — N1831 Chronic kidney disease, stage 3a: Secondary | ICD-10-CM | POA: Diagnosis not present

## 2024-01-22 DIAGNOSIS — E785 Hyperlipidemia, unspecified: Secondary | ICD-10-CM | POA: Diagnosis not present

## 2024-01-22 DIAGNOSIS — S32402D Unspecified fracture of left acetabulum, subsequent encounter for fracture with routine healing: Secondary | ICD-10-CM | POA: Diagnosis not present

## 2024-01-22 DIAGNOSIS — I4891 Unspecified atrial fibrillation: Secondary | ICD-10-CM | POA: Diagnosis not present

## 2024-01-27 DIAGNOSIS — S32512D Fracture of superior rim of left pubis, subsequent encounter for fracture with routine healing: Secondary | ICD-10-CM | POA: Diagnosis not present

## 2024-01-27 DIAGNOSIS — D631 Anemia in chronic kidney disease: Secondary | ICD-10-CM | POA: Diagnosis not present

## 2024-01-27 DIAGNOSIS — E785 Hyperlipidemia, unspecified: Secondary | ICD-10-CM | POA: Diagnosis not present

## 2024-01-27 DIAGNOSIS — I4891 Unspecified atrial fibrillation: Secondary | ICD-10-CM | POA: Diagnosis not present

## 2024-01-27 DIAGNOSIS — F0153 Vascular dementia, unspecified severity, with mood disturbance: Secondary | ICD-10-CM | POA: Diagnosis not present

## 2024-01-27 DIAGNOSIS — N1831 Chronic kidney disease, stage 3a: Secondary | ICD-10-CM | POA: Diagnosis not present

## 2024-01-27 DIAGNOSIS — D509 Iron deficiency anemia, unspecified: Secondary | ICD-10-CM | POA: Diagnosis not present

## 2024-01-27 DIAGNOSIS — S32402D Unspecified fracture of left acetabulum, subsequent encounter for fracture with routine healing: Secondary | ICD-10-CM | POA: Diagnosis not present

## 2024-01-27 DIAGNOSIS — G4700 Insomnia, unspecified: Secondary | ICD-10-CM | POA: Diagnosis not present

## 2024-01-30 DIAGNOSIS — F0153 Vascular dementia, unspecified severity, with mood disturbance: Secondary | ICD-10-CM | POA: Diagnosis not present

## 2024-01-30 DIAGNOSIS — N1831 Chronic kidney disease, stage 3a: Secondary | ICD-10-CM | POA: Diagnosis not present

## 2024-01-30 DIAGNOSIS — G4700 Insomnia, unspecified: Secondary | ICD-10-CM | POA: Diagnosis not present

## 2024-01-30 DIAGNOSIS — S32402D Unspecified fracture of left acetabulum, subsequent encounter for fracture with routine healing: Secondary | ICD-10-CM | POA: Diagnosis not present

## 2024-01-30 DIAGNOSIS — I4891 Unspecified atrial fibrillation: Secondary | ICD-10-CM | POA: Diagnosis not present

## 2024-01-30 DIAGNOSIS — S32512D Fracture of superior rim of left pubis, subsequent encounter for fracture with routine healing: Secondary | ICD-10-CM | POA: Diagnosis not present

## 2024-01-30 DIAGNOSIS — E785 Hyperlipidemia, unspecified: Secondary | ICD-10-CM | POA: Diagnosis not present

## 2024-01-30 DIAGNOSIS — D509 Iron deficiency anemia, unspecified: Secondary | ICD-10-CM | POA: Diagnosis not present

## 2024-01-30 DIAGNOSIS — N182 Chronic kidney disease, stage 2 (mild): Secondary | ICD-10-CM | POA: Diagnosis not present

## 2024-01-30 DIAGNOSIS — D631 Anemia in chronic kidney disease: Secondary | ICD-10-CM | POA: Diagnosis not present

## 2024-01-30 DIAGNOSIS — D649 Anemia, unspecified: Secondary | ICD-10-CM | POA: Diagnosis not present

## 2024-02-02 DIAGNOSIS — S32482D Displaced dome fracture of left acetabulum, subsequent encounter for fracture with routine healing: Secondary | ICD-10-CM | POA: Diagnosis not present

## 2024-02-03 DIAGNOSIS — S32512D Fracture of superior rim of left pubis, subsequent encounter for fracture with routine healing: Secondary | ICD-10-CM | POA: Diagnosis not present

## 2024-02-03 DIAGNOSIS — I4891 Unspecified atrial fibrillation: Secondary | ICD-10-CM | POA: Diagnosis not present

## 2024-02-03 DIAGNOSIS — E785 Hyperlipidemia, unspecified: Secondary | ICD-10-CM | POA: Diagnosis not present

## 2024-02-03 DIAGNOSIS — F0153 Vascular dementia, unspecified severity, with mood disturbance: Secondary | ICD-10-CM | POA: Diagnosis not present

## 2024-02-03 DIAGNOSIS — G4700 Insomnia, unspecified: Secondary | ICD-10-CM | POA: Diagnosis not present

## 2024-02-03 DIAGNOSIS — N1831 Chronic kidney disease, stage 3a: Secondary | ICD-10-CM | POA: Diagnosis not present

## 2024-02-03 DIAGNOSIS — D509 Iron deficiency anemia, unspecified: Secondary | ICD-10-CM | POA: Diagnosis not present

## 2024-02-03 DIAGNOSIS — S32402D Unspecified fracture of left acetabulum, subsequent encounter for fracture with routine healing: Secondary | ICD-10-CM | POA: Diagnosis not present

## 2024-02-03 DIAGNOSIS — D631 Anemia in chronic kidney disease: Secondary | ICD-10-CM | POA: Diagnosis not present

## 2024-02-05 DIAGNOSIS — N1831 Chronic kidney disease, stage 3a: Secondary | ICD-10-CM | POA: Diagnosis not present

## 2024-02-05 DIAGNOSIS — S32512D Fracture of superior rim of left pubis, subsequent encounter for fracture with routine healing: Secondary | ICD-10-CM | POA: Diagnosis not present

## 2024-02-05 DIAGNOSIS — D509 Iron deficiency anemia, unspecified: Secondary | ICD-10-CM | POA: Diagnosis not present

## 2024-02-05 DIAGNOSIS — E785 Hyperlipidemia, unspecified: Secondary | ICD-10-CM | POA: Diagnosis not present

## 2024-02-05 DIAGNOSIS — S32402D Unspecified fracture of left acetabulum, subsequent encounter for fracture with routine healing: Secondary | ICD-10-CM | POA: Diagnosis not present

## 2024-02-05 DIAGNOSIS — D631 Anemia in chronic kidney disease: Secondary | ICD-10-CM | POA: Diagnosis not present

## 2024-02-05 DIAGNOSIS — G4700 Insomnia, unspecified: Secondary | ICD-10-CM | POA: Diagnosis not present

## 2024-02-05 DIAGNOSIS — I4891 Unspecified atrial fibrillation: Secondary | ICD-10-CM | POA: Diagnosis not present

## 2024-02-05 DIAGNOSIS — F0153 Vascular dementia, unspecified severity, with mood disturbance: Secondary | ICD-10-CM | POA: Diagnosis not present

## 2024-02-07 DIAGNOSIS — D509 Iron deficiency anemia, unspecified: Secondary | ICD-10-CM | POA: Diagnosis not present

## 2024-02-09 DIAGNOSIS — G4700 Insomnia, unspecified: Secondary | ICD-10-CM | POA: Diagnosis not present

## 2024-02-09 DIAGNOSIS — S32512D Fracture of superior rim of left pubis, subsequent encounter for fracture with routine healing: Secondary | ICD-10-CM | POA: Diagnosis not present

## 2024-02-09 DIAGNOSIS — E785 Hyperlipidemia, unspecified: Secondary | ICD-10-CM | POA: Diagnosis not present

## 2024-02-09 DIAGNOSIS — S32402D Unspecified fracture of left acetabulum, subsequent encounter for fracture with routine healing: Secondary | ICD-10-CM | POA: Diagnosis not present

## 2024-02-09 DIAGNOSIS — I4891 Unspecified atrial fibrillation: Secondary | ICD-10-CM | POA: Diagnosis not present

## 2024-02-09 DIAGNOSIS — D509 Iron deficiency anemia, unspecified: Secondary | ICD-10-CM | POA: Diagnosis not present

## 2024-02-09 DIAGNOSIS — F0153 Vascular dementia, unspecified severity, with mood disturbance: Secondary | ICD-10-CM | POA: Diagnosis not present

## 2024-02-09 DIAGNOSIS — N1831 Chronic kidney disease, stage 3a: Secondary | ICD-10-CM | POA: Diagnosis not present

## 2024-02-09 DIAGNOSIS — D631 Anemia in chronic kidney disease: Secondary | ICD-10-CM | POA: Diagnosis not present

## 2024-02-11 DIAGNOSIS — D631 Anemia in chronic kidney disease: Secondary | ICD-10-CM | POA: Diagnosis not present

## 2024-02-11 DIAGNOSIS — I4891 Unspecified atrial fibrillation: Secondary | ICD-10-CM | POA: Diagnosis not present

## 2024-02-11 DIAGNOSIS — G4700 Insomnia, unspecified: Secondary | ICD-10-CM | POA: Diagnosis not present

## 2024-02-11 DIAGNOSIS — F0153 Vascular dementia, unspecified severity, with mood disturbance: Secondary | ICD-10-CM | POA: Diagnosis not present

## 2024-02-11 DIAGNOSIS — N1831 Chronic kidney disease, stage 3a: Secondary | ICD-10-CM | POA: Diagnosis not present

## 2024-02-11 DIAGNOSIS — S32402D Unspecified fracture of left acetabulum, subsequent encounter for fracture with routine healing: Secondary | ICD-10-CM | POA: Diagnosis not present

## 2024-02-11 DIAGNOSIS — E785 Hyperlipidemia, unspecified: Secondary | ICD-10-CM | POA: Diagnosis not present

## 2024-02-11 DIAGNOSIS — S32512D Fracture of superior rim of left pubis, subsequent encounter for fracture with routine healing: Secondary | ICD-10-CM | POA: Diagnosis not present

## 2024-02-11 DIAGNOSIS — D509 Iron deficiency anemia, unspecified: Secondary | ICD-10-CM | POA: Diagnosis not present

## 2024-02-16 DIAGNOSIS — S32402D Unspecified fracture of left acetabulum, subsequent encounter for fracture with routine healing: Secondary | ICD-10-CM | POA: Diagnosis not present

## 2024-02-16 DIAGNOSIS — S32512D Fracture of superior rim of left pubis, subsequent encounter for fracture with routine healing: Secondary | ICD-10-CM | POA: Diagnosis not present

## 2024-02-16 DIAGNOSIS — E785 Hyperlipidemia, unspecified: Secondary | ICD-10-CM | POA: Diagnosis not present

## 2024-02-16 DIAGNOSIS — I4891 Unspecified atrial fibrillation: Secondary | ICD-10-CM | POA: Diagnosis not present

## 2024-02-16 DIAGNOSIS — F0153 Vascular dementia, unspecified severity, with mood disturbance: Secondary | ICD-10-CM | POA: Diagnosis not present

## 2024-02-16 DIAGNOSIS — N1831 Chronic kidney disease, stage 3a: Secondary | ICD-10-CM | POA: Diagnosis not present

## 2024-02-16 DIAGNOSIS — D509 Iron deficiency anemia, unspecified: Secondary | ICD-10-CM | POA: Diagnosis not present

## 2024-02-16 DIAGNOSIS — G4700 Insomnia, unspecified: Secondary | ICD-10-CM | POA: Diagnosis not present

## 2024-02-16 DIAGNOSIS — D631 Anemia in chronic kidney disease: Secondary | ICD-10-CM | POA: Diagnosis not present

## 2024-02-17 DIAGNOSIS — Z9181 History of falling: Secondary | ICD-10-CM | POA: Diagnosis not present

## 2024-02-17 DIAGNOSIS — S32402S Unspecified fracture of left acetabulum, sequela: Secondary | ICD-10-CM | POA: Diagnosis not present

## 2024-02-17 DIAGNOSIS — E782 Mixed hyperlipidemia: Secondary | ICD-10-CM | POA: Diagnosis not present

## 2024-02-17 DIAGNOSIS — K5900 Constipation, unspecified: Secondary | ICD-10-CM | POA: Diagnosis not present

## 2024-02-17 DIAGNOSIS — S32512S Fracture of superior rim of left pubis, sequela: Secondary | ICD-10-CM | POA: Diagnosis not present

## 2024-02-17 DIAGNOSIS — D509 Iron deficiency anemia, unspecified: Secondary | ICD-10-CM | POA: Diagnosis not present

## 2024-02-18 DIAGNOSIS — E785 Hyperlipidemia, unspecified: Secondary | ICD-10-CM | POA: Diagnosis not present

## 2024-02-18 DIAGNOSIS — I4891 Unspecified atrial fibrillation: Secondary | ICD-10-CM | POA: Diagnosis not present

## 2024-02-18 DIAGNOSIS — Z7901 Long term (current) use of anticoagulants: Secondary | ICD-10-CM | POA: Diagnosis not present

## 2024-02-18 DIAGNOSIS — F0390 Unspecified dementia without behavioral disturbance: Secondary | ICD-10-CM | POA: Diagnosis not present

## 2024-02-18 DIAGNOSIS — D631 Anemia in chronic kidney disease: Secondary | ICD-10-CM | POA: Diagnosis not present

## 2024-02-18 DIAGNOSIS — N1831 Chronic kidney disease, stage 3a: Secondary | ICD-10-CM | POA: Diagnosis not present

## 2024-02-18 DIAGNOSIS — S32402D Unspecified fracture of left acetabulum, subsequent encounter for fracture with routine healing: Secondary | ICD-10-CM | POA: Diagnosis not present

## 2024-02-18 DIAGNOSIS — D509 Iron deficiency anemia, unspecified: Secondary | ICD-10-CM | POA: Diagnosis not present

## 2024-02-18 DIAGNOSIS — N182 Chronic kidney disease, stage 2 (mild): Secondary | ICD-10-CM | POA: Diagnosis not present

## 2024-02-18 DIAGNOSIS — S32512D Fracture of superior rim of left pubis, subsequent encounter for fracture with routine healing: Secondary | ICD-10-CM | POA: Diagnosis not present

## 2024-02-18 DIAGNOSIS — F0153 Vascular dementia, unspecified severity, with mood disturbance: Secondary | ICD-10-CM | POA: Diagnosis not present

## 2024-02-18 DIAGNOSIS — G4700 Insomnia, unspecified: Secondary | ICD-10-CM | POA: Diagnosis not present

## 2024-02-24 DIAGNOSIS — D509 Iron deficiency anemia, unspecified: Secondary | ICD-10-CM | POA: Diagnosis not present

## 2024-02-24 DIAGNOSIS — I4891 Unspecified atrial fibrillation: Secondary | ICD-10-CM | POA: Diagnosis not present

## 2024-02-24 DIAGNOSIS — F0153 Vascular dementia, unspecified severity, with mood disturbance: Secondary | ICD-10-CM | POA: Diagnosis not present

## 2024-02-24 DIAGNOSIS — E785 Hyperlipidemia, unspecified: Secondary | ICD-10-CM | POA: Diagnosis not present

## 2024-02-24 DIAGNOSIS — N1831 Chronic kidney disease, stage 3a: Secondary | ICD-10-CM | POA: Diagnosis not present

## 2024-02-24 DIAGNOSIS — G4700 Insomnia, unspecified: Secondary | ICD-10-CM | POA: Diagnosis not present

## 2024-02-24 DIAGNOSIS — S32402D Unspecified fracture of left acetabulum, subsequent encounter for fracture with routine healing: Secondary | ICD-10-CM | POA: Diagnosis not present

## 2024-02-24 DIAGNOSIS — D631 Anemia in chronic kidney disease: Secondary | ICD-10-CM | POA: Diagnosis not present

## 2024-02-24 DIAGNOSIS — S32512D Fracture of superior rim of left pubis, subsequent encounter for fracture with routine healing: Secondary | ICD-10-CM | POA: Diagnosis not present

## 2024-02-26 DIAGNOSIS — F39 Unspecified mood [affective] disorder: Secondary | ICD-10-CM | POA: Diagnosis not present

## 2024-02-26 DIAGNOSIS — F0390 Unspecified dementia without behavioral disturbance: Secondary | ICD-10-CM | POA: Diagnosis not present

## 2024-02-27 DIAGNOSIS — D509 Iron deficiency anemia, unspecified: Secondary | ICD-10-CM | POA: Diagnosis not present

## 2024-02-27 DIAGNOSIS — S32512D Fracture of superior rim of left pubis, subsequent encounter for fracture with routine healing: Secondary | ICD-10-CM | POA: Diagnosis not present

## 2024-02-27 DIAGNOSIS — D631 Anemia in chronic kidney disease: Secondary | ICD-10-CM | POA: Diagnosis not present

## 2024-02-27 DIAGNOSIS — N189 Chronic kidney disease, unspecified: Secondary | ICD-10-CM | POA: Diagnosis not present

## 2024-02-27 DIAGNOSIS — F0153 Vascular dementia, unspecified severity, with mood disturbance: Secondary | ICD-10-CM | POA: Diagnosis not present

## 2024-02-27 DIAGNOSIS — I4891 Unspecified atrial fibrillation: Secondary | ICD-10-CM | POA: Diagnosis not present

## 2024-02-27 DIAGNOSIS — F0150 Vascular dementia without behavioral disturbance: Secondary | ICD-10-CM | POA: Diagnosis not present

## 2024-02-27 DIAGNOSIS — S32402D Unspecified fracture of left acetabulum, subsequent encounter for fracture with routine healing: Secondary | ICD-10-CM | POA: Diagnosis not present

## 2024-02-27 DIAGNOSIS — N1831 Chronic kidney disease, stage 3a: Secondary | ICD-10-CM | POA: Diagnosis not present

## 2024-02-27 DIAGNOSIS — E785 Hyperlipidemia, unspecified: Secondary | ICD-10-CM | POA: Diagnosis not present

## 2024-02-27 DIAGNOSIS — G4700 Insomnia, unspecified: Secondary | ICD-10-CM | POA: Diagnosis not present

## 2024-02-27 DIAGNOSIS — Z8673 Personal history of transient ischemic attack (TIA), and cerebral infarction without residual deficits: Secondary | ICD-10-CM | POA: Diagnosis not present

## 2024-02-27 DIAGNOSIS — D649 Anemia, unspecified: Secondary | ICD-10-CM | POA: Diagnosis not present

## 2024-03-04 DIAGNOSIS — F0153 Vascular dementia, unspecified severity, with mood disturbance: Secondary | ICD-10-CM | POA: Diagnosis not present

## 2024-03-04 DIAGNOSIS — S32512D Fracture of superior rim of left pubis, subsequent encounter for fracture with routine healing: Secondary | ICD-10-CM | POA: Diagnosis not present

## 2024-03-04 DIAGNOSIS — D631 Anemia in chronic kidney disease: Secondary | ICD-10-CM | POA: Diagnosis not present

## 2024-03-04 DIAGNOSIS — I4891 Unspecified atrial fibrillation: Secondary | ICD-10-CM | POA: Diagnosis not present

## 2024-03-04 DIAGNOSIS — F01518 Vascular dementia, unspecified severity, with other behavioral disturbance: Secondary | ICD-10-CM | POA: Diagnosis not present

## 2024-03-04 DIAGNOSIS — S32402D Unspecified fracture of left acetabulum, subsequent encounter for fracture with routine healing: Secondary | ICD-10-CM | POA: Diagnosis not present

## 2024-03-04 DIAGNOSIS — G4700 Insomnia, unspecified: Secondary | ICD-10-CM | POA: Diagnosis not present

## 2024-03-04 DIAGNOSIS — D509 Iron deficiency anemia, unspecified: Secondary | ICD-10-CM | POA: Diagnosis not present

## 2024-03-04 DIAGNOSIS — N1831 Chronic kidney disease, stage 3a: Secondary | ICD-10-CM | POA: Diagnosis not present

## 2024-03-09 DIAGNOSIS — S32402D Unspecified fracture of left acetabulum, subsequent encounter for fracture with routine healing: Secondary | ICD-10-CM | POA: Diagnosis not present

## 2024-03-09 DIAGNOSIS — S32512D Fracture of superior rim of left pubis, subsequent encounter for fracture with routine healing: Secondary | ICD-10-CM | POA: Diagnosis not present

## 2024-03-09 DIAGNOSIS — I4891 Unspecified atrial fibrillation: Secondary | ICD-10-CM | POA: Diagnosis not present

## 2024-03-09 DIAGNOSIS — F0153 Vascular dementia, unspecified severity, with mood disturbance: Secondary | ICD-10-CM | POA: Diagnosis not present

## 2024-03-09 DIAGNOSIS — D631 Anemia in chronic kidney disease: Secondary | ICD-10-CM | POA: Diagnosis not present

## 2024-03-09 DIAGNOSIS — F01518 Vascular dementia, unspecified severity, with other behavioral disturbance: Secondary | ICD-10-CM | POA: Diagnosis not present

## 2024-03-09 DIAGNOSIS — G4700 Insomnia, unspecified: Secondary | ICD-10-CM | POA: Diagnosis not present

## 2024-03-09 DIAGNOSIS — D509 Iron deficiency anemia, unspecified: Secondary | ICD-10-CM | POA: Diagnosis not present

## 2024-03-09 DIAGNOSIS — N1831 Chronic kidney disease, stage 3a: Secondary | ICD-10-CM | POA: Diagnosis not present

## 2024-03-12 DIAGNOSIS — Z8673 Personal history of transient ischemic attack (TIA), and cerebral infarction without residual deficits: Secondary | ICD-10-CM | POA: Diagnosis not present

## 2024-03-12 DIAGNOSIS — F0150 Vascular dementia without behavioral disturbance: Secondary | ICD-10-CM | POA: Diagnosis not present

## 2024-03-12 DIAGNOSIS — G4700 Insomnia, unspecified: Secondary | ICD-10-CM | POA: Diagnosis not present

## 2024-03-13 DIAGNOSIS — S32402D Unspecified fracture of left acetabulum, subsequent encounter for fracture with routine healing: Secondary | ICD-10-CM | POA: Diagnosis not present

## 2024-03-13 DIAGNOSIS — S32512D Fracture of superior rim of left pubis, subsequent encounter for fracture with routine healing: Secondary | ICD-10-CM | POA: Diagnosis not present

## 2024-03-13 DIAGNOSIS — F0153 Vascular dementia, unspecified severity, with mood disturbance: Secondary | ICD-10-CM | POA: Diagnosis not present

## 2024-03-15 DIAGNOSIS — S32482D Displaced dome fracture of left acetabulum, subsequent encounter for fracture with routine healing: Secondary | ICD-10-CM | POA: Diagnosis not present

## 2024-03-16 DIAGNOSIS — Z9181 History of falling: Secondary | ICD-10-CM | POA: Diagnosis not present

## 2024-03-16 DIAGNOSIS — Z79899 Other long term (current) drug therapy: Secondary | ICD-10-CM | POA: Diagnosis not present

## 2024-03-16 DIAGNOSIS — S32512S Fracture of superior rim of left pubis, sequela: Secondary | ICD-10-CM | POA: Diagnosis not present

## 2024-03-16 DIAGNOSIS — I4891 Unspecified atrial fibrillation: Secondary | ICD-10-CM | POA: Diagnosis not present

## 2024-03-16 DIAGNOSIS — S32402S Unspecified fracture of left acetabulum, sequela: Secondary | ICD-10-CM | POA: Diagnosis not present

## 2024-03-16 DIAGNOSIS — N182 Chronic kidney disease, stage 2 (mild): Secondary | ICD-10-CM | POA: Diagnosis not present

## 2024-03-16 DIAGNOSIS — F0390 Unspecified dementia without behavioral disturbance: Secondary | ICD-10-CM | POA: Diagnosis not present

## 2024-03-16 DIAGNOSIS — Z7901 Long term (current) use of anticoagulants: Secondary | ICD-10-CM | POA: Diagnosis not present

## 2024-03-23 DIAGNOSIS — S32512D Fracture of superior rim of left pubis, subsequent encounter for fracture with routine healing: Secondary | ICD-10-CM | POA: Diagnosis not present

## 2024-03-23 DIAGNOSIS — Z8673 Personal history of transient ischemic attack (TIA), and cerebral infarction without residual deficits: Secondary | ICD-10-CM | POA: Diagnosis not present

## 2024-03-23 DIAGNOSIS — S32402D Unspecified fracture of left acetabulum, subsequent encounter for fracture with routine healing: Secondary | ICD-10-CM | POA: Diagnosis not present

## 2024-03-23 DIAGNOSIS — M6281 Muscle weakness (generalized): Secondary | ICD-10-CM | POA: Diagnosis not present

## 2024-03-24 DIAGNOSIS — R2681 Unsteadiness on feet: Secondary | ICD-10-CM | POA: Diagnosis not present

## 2024-03-24 DIAGNOSIS — R278 Other lack of coordination: Secondary | ICD-10-CM | POA: Diagnosis not present

## 2024-03-25 DIAGNOSIS — N189 Chronic kidney disease, unspecified: Secondary | ICD-10-CM | POA: Diagnosis not present

## 2024-03-25 DIAGNOSIS — B351 Tinea unguium: Secondary | ICD-10-CM | POA: Diagnosis not present

## 2024-03-25 DIAGNOSIS — M79675 Pain in left toe(s): Secondary | ICD-10-CM | POA: Diagnosis not present

## 2024-03-30 ENCOUNTER — Encounter: Payer: Self-pay | Admitting: Cardiology

## 2024-03-30 DIAGNOSIS — M6281 Muscle weakness (generalized): Secondary | ICD-10-CM | POA: Diagnosis not present

## 2024-03-30 DIAGNOSIS — S32512D Fracture of superior rim of left pubis, subsequent encounter for fracture with routine healing: Secondary | ICD-10-CM | POA: Diagnosis not present

## 2024-03-30 DIAGNOSIS — S32402D Unspecified fracture of left acetabulum, subsequent encounter for fracture with routine healing: Secondary | ICD-10-CM | POA: Diagnosis not present

## 2024-03-30 DIAGNOSIS — Z8673 Personal history of transient ischemic attack (TIA), and cerebral infarction without residual deficits: Secondary | ICD-10-CM | POA: Diagnosis not present

## 2024-04-01 DIAGNOSIS — M6281 Muscle weakness (generalized): Secondary | ICD-10-CM | POA: Diagnosis not present

## 2024-04-01 DIAGNOSIS — F39 Unspecified mood [affective] disorder: Secondary | ICD-10-CM | POA: Diagnosis not present

## 2024-04-01 DIAGNOSIS — D649 Anemia, unspecified: Secondary | ICD-10-CM | POA: Diagnosis not present

## 2024-04-01 DIAGNOSIS — F0390 Unspecified dementia without behavioral disturbance: Secondary | ICD-10-CM | POA: Diagnosis not present

## 2024-04-01 DIAGNOSIS — Z8673 Personal history of transient ischemic attack (TIA), and cerebral infarction without residual deficits: Secondary | ICD-10-CM | POA: Diagnosis not present

## 2024-04-01 DIAGNOSIS — C3490 Malignant neoplasm of unspecified part of unspecified bronchus or lung: Secondary | ICD-10-CM | POA: Diagnosis not present

## 2024-04-01 DIAGNOSIS — S32512D Fracture of superior rim of left pubis, subsequent encounter for fracture with routine healing: Secondary | ICD-10-CM | POA: Diagnosis not present

## 2024-04-01 DIAGNOSIS — S32402D Unspecified fracture of left acetabulum, subsequent encounter for fracture with routine healing: Secondary | ICD-10-CM | POA: Diagnosis not present

## 2024-04-07 DIAGNOSIS — S32402D Unspecified fracture of left acetabulum, subsequent encounter for fracture with routine healing: Secondary | ICD-10-CM | POA: Diagnosis not present

## 2024-04-07 DIAGNOSIS — S32512D Fracture of superior rim of left pubis, subsequent encounter for fracture with routine healing: Secondary | ICD-10-CM | POA: Diagnosis not present

## 2024-04-07 DIAGNOSIS — R278 Other lack of coordination: Secondary | ICD-10-CM | POA: Diagnosis not present

## 2024-04-07 DIAGNOSIS — M6281 Muscle weakness (generalized): Secondary | ICD-10-CM | POA: Diagnosis not present

## 2024-04-07 DIAGNOSIS — R2681 Unsteadiness on feet: Secondary | ICD-10-CM | POA: Diagnosis not present

## 2024-04-07 DIAGNOSIS — Z8673 Personal history of transient ischemic attack (TIA), and cerebral infarction without residual deficits: Secondary | ICD-10-CM | POA: Diagnosis not present

## 2024-04-08 DIAGNOSIS — M6281 Muscle weakness (generalized): Secondary | ICD-10-CM | POA: Diagnosis not present

## 2024-04-08 DIAGNOSIS — S32402D Unspecified fracture of left acetabulum, subsequent encounter for fracture with routine healing: Secondary | ICD-10-CM | POA: Diagnosis not present

## 2024-04-08 DIAGNOSIS — S32512D Fracture of superior rim of left pubis, subsequent encounter for fracture with routine healing: Secondary | ICD-10-CM | POA: Diagnosis not present

## 2024-04-08 DIAGNOSIS — Z8673 Personal history of transient ischemic attack (TIA), and cerebral infarction without residual deficits: Secondary | ICD-10-CM | POA: Diagnosis not present

## 2024-04-09 DIAGNOSIS — S32402D Unspecified fracture of left acetabulum, subsequent encounter for fracture with routine healing: Secondary | ICD-10-CM | POA: Diagnosis not present

## 2024-04-09 DIAGNOSIS — S32512D Fracture of superior rim of left pubis, subsequent encounter for fracture with routine healing: Secondary | ICD-10-CM | POA: Diagnosis not present

## 2024-04-09 DIAGNOSIS — Z8673 Personal history of transient ischemic attack (TIA), and cerebral infarction without residual deficits: Secondary | ICD-10-CM | POA: Diagnosis not present

## 2024-04-09 DIAGNOSIS — M6281 Muscle weakness (generalized): Secondary | ICD-10-CM | POA: Diagnosis not present

## 2024-04-09 DIAGNOSIS — D509 Iron deficiency anemia, unspecified: Secondary | ICD-10-CM | POA: Diagnosis not present

## 2024-04-11 DIAGNOSIS — R278 Other lack of coordination: Secondary | ICD-10-CM | POA: Diagnosis not present

## 2024-04-11 DIAGNOSIS — R2681 Unsteadiness on feet: Secondary | ICD-10-CM | POA: Diagnosis not present

## 2024-04-13 ENCOUNTER — Emergency Department (HOSPITAL_COMMUNITY)

## 2024-04-13 ENCOUNTER — Other Ambulatory Visit: Payer: Self-pay

## 2024-04-13 ENCOUNTER — Emergency Department (HOSPITAL_COMMUNITY)
Admission: EM | Admit: 2024-04-13 | Discharge: 2024-04-14 | Disposition: A | Discharge status: Home / Self Care | Source: Skilled Nursing Facility | Attending: Emergency Medicine | Admitting: Emergency Medicine

## 2024-04-13 ENCOUNTER — Encounter (HOSPITAL_COMMUNITY): Payer: Self-pay | Admitting: Emergency Medicine

## 2024-04-13 DIAGNOSIS — R079 Chest pain, unspecified: Secondary | ICD-10-CM | POA: Diagnosis not present

## 2024-04-13 DIAGNOSIS — I700 Atherosclerosis of aorta: Secondary | ICD-10-CM | POA: Diagnosis not present

## 2024-04-13 DIAGNOSIS — E860 Dehydration: Secondary | ICD-10-CM | POA: Diagnosis not present

## 2024-04-13 DIAGNOSIS — Z7901 Long term (current) use of anticoagulants: Secondary | ICD-10-CM | POA: Diagnosis not present

## 2024-04-13 DIAGNOSIS — R918 Other nonspecific abnormal finding of lung field: Secondary | ICD-10-CM | POA: Diagnosis not present

## 2024-04-13 DIAGNOSIS — R4182 Altered mental status, unspecified: Secondary | ICD-10-CM | POA: Diagnosis not present

## 2024-04-13 DIAGNOSIS — F0390 Unspecified dementia without behavioral disturbance: Secondary | ICD-10-CM | POA: Diagnosis not present

## 2024-04-13 DIAGNOSIS — N3289 Other specified disorders of bladder: Secondary | ICD-10-CM | POA: Insufficient documentation

## 2024-04-13 DIAGNOSIS — I517 Cardiomegaly: Secondary | ICD-10-CM | POA: Diagnosis not present

## 2024-04-13 DIAGNOSIS — R000 Tachycardia, unspecified: Secondary | ICD-10-CM | POA: Insufficient documentation

## 2024-04-13 DIAGNOSIS — C799 Secondary malignant neoplasm of unspecified site: Secondary | ICD-10-CM | POA: Insufficient documentation

## 2024-04-13 DIAGNOSIS — R531 Weakness: Secondary | ICD-10-CM | POA: Diagnosis not present

## 2024-04-13 LAB — URINALYSIS, ROUTINE W REFLEX MICROSCOPIC
Bilirubin Urine: NEGATIVE
Glucose, UA: NEGATIVE mg/dL
Hgb urine dipstick: NEGATIVE
Ketones, ur: NEGATIVE mg/dL
Nitrite: NEGATIVE
Protein, ur: NEGATIVE mg/dL
Specific Gravity, Urine: 1.019 (ref 1.005–1.030)
WBC, UA: >50 WBC/hpf (ref 0–5)
pH: 5 (ref 5.0–8.0)

## 2024-04-13 LAB — CBC WITH DIFFERENTIAL/PLATELET
Abs Immature Granulocytes: 0.04 K/uL (ref 0.00–0.07)
Basophils Absolute: 0 K/uL (ref 0.0–0.1)
Basophils Relative: 0 %
Eosinophils Absolute: 0 K/uL (ref 0.0–0.5)
Eosinophils Relative: 0 %
HCT: 48.2 % — ABNORMAL HIGH (ref 36.0–46.0)
Hemoglobin: 14.6 g/dL (ref 12.0–15.0)
Immature Granulocytes: 0 %
Lymphocytes Relative: 14 %
Lymphs Abs: 1.3 K/uL (ref 0.7–4.0)
MCH: 29.8 pg (ref 26.0–34.0)
MCHC: 30.3 g/dL (ref 30.0–36.0)
MCV: 98.4 fL (ref 80.0–100.0)
Monocytes Absolute: 1.2 K/uL — ABNORMAL HIGH (ref 0.1–1.0)
Monocytes Relative: 13 %
Neutro Abs: 6.6 K/uL (ref 1.7–7.7)
Neutrophils Relative %: 73 %
Platelets: 257 K/uL (ref 150–400)
RBC: 4.9 MIL/uL (ref 3.87–5.11)
RDW: 17.2 % — ABNORMAL HIGH (ref 11.5–15.5)
WBC: 9.2 K/uL (ref 4.0–10.5)
nRBC: 0 % (ref 0.0–0.2)

## 2024-04-13 LAB — LIPASE, BLOOD: Lipase: 122 U/L — ABNORMAL HIGH (ref 11–51)

## 2024-04-13 LAB — TROPONIN T, HIGH SENSITIVITY: Troponin T High Sensitivity: 20 ng/L — ABNORMAL HIGH (ref 0–19)

## 2024-04-13 MED ORDER — LACTATED RINGERS IV BOLUS
500.0000 mL | Freq: Once | INTRAVENOUS | Status: AC
Start: 2024-04-13 — End: 2024-04-14
  Administered 2024-04-13: 500 mL via INTRAVENOUS

## 2024-04-13 NOTE — ED Triage Notes (Addendum)
 Pt arrives w/ [facility] from [facility] w/ c/o generalized weakness & not being able to eat the last few days. Dementia at baseline.

## 2024-04-13 NOTE — ED Provider Notes (Signed)
 [place] [facility]ERGENC[facility] DEPARTMENT AT [facility] Provider Note   CSN: [id number] Arrival date & time: 04/13/24  2013     Patient presents with: Weakness   [name] is a 89 y.o. female.  {Add pertinent medical, surgical, social history, OB history to HPI:32947}  Weakness    Patient has a history of hyperlipidemia anemia atrial fibrillation stroke dementia who presents ED for evaluation of decreased appetite generalized weakness for the last couple days.  Patient herself denies any complaints.  She states she is not having a headache she is having chest pain she denies any abdominal pain.  Patient denies feeling nauseated.  She cannot explain why she does not have much of an appetite.  Right now she just states she feels tired  Prior to Admission medications   Medication Sig Start Date End Date Taking? Authorizing Provider  acetaminophen  (TYLENOL ) 650 MG CR tablet Take 650 mg by mouth every 8 (eight) hours.    [provider]  Ascorbic Acid (VITAMIN C) 500 MG CAPS Take 500 mg by mouth daily.    [provider]  atorvastatin  (LIPITOR) 10 MG tablet TAKE 1/2 TABLET BY MOUTH ONCE A DAY OR AS INSTRUCTED BY PHYSICIAN Patient taking differently: Take 5 mg by mouth daily. TAKE 1/2 TABLET BY MOUTH ONCE A DAY OR AS INSTRUCTED BY PHYSICIAN 10/17/22   Jolinda Potter M, DO  diltiazem  (CARDIZEM  CD) 180 MG 24 hr capsule TAKE 1 CAPSULE BY MOUTH EVERY DAY 10/16/22   Gottschalk, Ashly M, DO  feeding supplement (ENSURE ENLIVE / ENSURE PLUS) LIQD Take 237 mLs by mouth 2 (two) times daily between meals. 11/05/23   Sheikh, Omair Latif, DO  ferrous sulfate 325 (65 FE) MG EC tablet Take 325 mg by mouth 3 (three) times a week.    [provider]  folic acid (FOLVITE) 800 MCG tablet Take 400 mcg by mouth daily.    [provider]  Magnesium Oxide -Mg Supplement 500 MG TABS Take 1 tablet by mouth daily. 10/09/23   [provider]  melatonin 5 MG TABS  Take 5 mg by mouth at bedtime.    [provider]  meloxicam (MOBIC) 7.5 MG tablet Take 7.5 mg by mouth daily.    [provider]  memantine  (NAMENDA ) 10 MG tablet Take 1 tablet (10 mg total) by mouth 2 (two) times daily. 06/23/23   Onita Duos, MD  methocarbamol  (ROBAXIN ) 500 MG tablet Take 1 tablet (500 mg total) by mouth every 6 (six) hours as needed for muscle spasms. 11/05/23   Sherrill Cable Latif, DO  Multiple Vitamin (MULTIVITAMIN) capsule Take 1 capsule by mouth daily.      [provider]  polyethylene glycol (MIRALAX  / GLYCOLAX ) 17 g packet Take 17 g by mouth daily as needed for mild constipation. Patient taking differently: Take 17 g by mouth daily. 11/05/23   Sherrill Cable Latif, DO  Protein (PROSOURCE PO) Take 30 mLs by mouth 3 (three) times daily.    [provider]  Rivaroxaban  (XARELTO ) 15 MG TABS tablet TAKE 1 TABLET (15 MG TOTAL) BY MOUTH DAILY. 04/14/23   Jolinda Potter M, DO  Calcium  Carbonate (CALCIUM  500 PO) Take 1 tablet by mouth daily.    10/30/11  [provider]    Allergies: Patient has no known allergies.    Review of Systems  Neurological:  Positive for weakness.    Updated Vital Signs BP 111/63 (BP Location: Right Arm)   Pulse (!) 58  Temp (!) 97.5 F (36.4 C) (Oral)   Resp 18   SpO2 100%   Physical Exam Vitals and nursing note reviewed.  Constitutional:      Appearance: She is well-developed. She is not diaphoretic.     Comments: Elderly, frail  HENT:     Head: Normocephalic and atraumatic.     Right Ear: External ear normal.     Left Ear: External ear normal.  Eyes:     General: No scleral icterus.       Right eye: No discharge.        Left eye: No discharge.     Conjunctiva/sclera: Conjunctivae normal.  Neck:     Trachea: No tracheal deviation.  Cardiovascular:     Rate and Rhythm: Tachycardia present. Rhythm irregular.  Pulmonary:     Effort: Pulmonary effort is normal. No respiratory distress.      Breath sounds: Normal breath sounds. No stridor. No wheezing or rales.  Abdominal:     General: Bowel sounds are normal. There is no distension.     Palpations: Abdomen is soft.     Tenderness: There is no abdominal tenderness. There is no guarding or rebound.  Musculoskeletal:        General: No tenderness or deformity.     Cervical back: Neck supple.  Skin:    General: Skin is warm and dry.     Findings: No rash.  Neurological:     General: No focal deficit present.     Mental Status: She is alert.     Cranial Nerves: No cranial nerve deficit, dysarthria or facial asymmetry.     Sensory: No sensory deficit.     Motor: Weakness present. No abnormal muscle tone or seizure activity.     Coordination: Coordination normal.     Comments: Generalized weakness however patient will lift both arms off the bed, can move both legs off the bed, movements are slow  Psychiatric:        Mood and Affect: Mood normal.     (all labs ordered are listed, but only abnormal results are displayed) Labs Reviewed  CBC WITH DIFFERENTIAL/PLATELET - Abnormal; Notable for the following components:      Result Value   HCT 48.2 (*)    RDW 17.2 (*)    Monocytes Absolute 1.2 (*)    All other components within normal limits  URINALYSIS, ROUTINE W REFLEX MICROSCOPIC - Abnormal; Notable for the following components:   APPearance HAZY (*)    Leukocytes,Ua MODERATE (*)    Bacteria, UA MANY (*)    All other components within normal limits  COMPREHENSIVE METABOLIC PANEL WITH GFR  TROPONIN T, HIGH SENSITIVITY    EKG: EKG Interpretation Date/Time:  Tuesday April 13 2024 20:30:47 EDT Ventricular Rate:  118 PR Interval:    QRS Duration:  72 QT Interval:  352 QTC Calculation: 443 R Axis:   -34  Text Interpretation: Atrial fibrillation Ventricular premature complex Left axis deviation Anterior infarct, old Borderline repolarization abnormality Confirmed by Randol Simmonds [phone]) on 04/13/2024 10:09:41  PM  Radiology: [facility] Chest 2 View Result Date: 04/13/2024 CLINICAL DATA:  Weakness EXAM: CHEST - 2 VIEW COMPARISON:  12/24/2023 FINDINGS: Mild cardiomegaly. Possible small pleural effusions. No consolidation. Aortic atherosclerosis. No pneumothorax IMPRESSION: Mild cardiomegaly with possible small pleural effusions. Electronically Signed   By: Luke Bun M.D.   On: 04/13/2024 22:30    {Document cardiac monitor, telemetry assessment procedure when appropriate:32947} Procedures   Medications Ordered in the  ED  lactated ringers  bolus 500 mL (has no administration in time range)      {Click here for ABCD2, HEART and other calculators REFRESH Note before signing:1}                              Medical Decision Making Amount and/or Complexity of Data Reviewed Labs: ordered. Radiology: ordered.   ***  {Document critical care time when appropriate  Document review of labs and clinical decision tools ie CHADS2VASC2, etc  Document your independent review of radiology images and any outside records  Document your discussion with family members, caretakers and with consultants  Document social determinants of health affecting pt's care  Document your decision making why or why not admission, treatments were needed:32947:::1}   Final diagnoses:  None    ED Discharge Orders     None

## 2024-04-13 NOTE — ED Notes (Signed)
 Family requesting IV fluids- RN explained that MD must order and blood and urine has been sent- awaiting results. Family states well clock is ticking

## 2024-04-13 NOTE — ED Notes (Addendum)
 PT had liquid BM; cleaned and changed. In and out cath performed for urine sample.

## 2024-04-14 ENCOUNTER — Emergency Department (HOSPITAL_COMMUNITY)

## 2024-04-14 DIAGNOSIS — R591 Generalized enlarged lymph nodes: Secondary | ICD-10-CM | POA: Diagnosis not present

## 2024-04-14 DIAGNOSIS — Z7401 Bed confinement status: Secondary | ICD-10-CM | POA: Diagnosis not present

## 2024-04-14 DIAGNOSIS — J439 Emphysema, unspecified: Secondary | ICD-10-CM | POA: Diagnosis not present

## 2024-04-14 DIAGNOSIS — E860 Dehydration: Secondary | ICD-10-CM | POA: Diagnosis not present

## 2024-04-14 MED ORDER — LACTATED RINGERS IV BOLUS
500.0000 mL | Freq: Once | INTRAVENOUS | Status: AC
  Administered 2024-04-14: 500 mL via INTRAVENOUS

## 2024-04-14 MED ORDER — SODIUM CHLORIDE 0.9 % IV SOLN
1.0000 g | Freq: Once | INTRAVENOUS | Status: AC
  Administered 2024-04-14: 1 g via INTRAVENOUS
  Filled 2024-04-14: qty 10

## 2024-04-14 MED ORDER — IOHEXOL 300 MG/ML  SOLN
75.0000 mL | Freq: Once | INTRAMUSCULAR | Status: AC | PRN
  Administered 2024-04-14: 75 mL via INTRAVENOUS

## 2024-04-14 MED ORDER — FENTANYL CITRATE PF 50 MCG/ML IJ SOSY
25.0000 ug | PREFILLED_SYRINGE | Freq: Once | INTRAMUSCULAR | Status: AC
  Administered 2024-04-14: 25 ug via INTRAVENOUS
  Filled 2024-04-14: qty 1

## 2024-04-14 MED ORDER — FENTANYL CITRATE PF 50 MCG/ML IJ SOSY: 50.0000 ug | PREFILLED_SYRINGE | INTRAMUSCULAR | Status: DC | PRN

## 2024-04-14 NOTE — ED Notes (Signed)
 PT appears comfortable and in no distress.

## 2024-04-14 NOTE — Discharge Instructions (Signed)
 Please return for any concern

## 2024-04-14 NOTE — ED Notes (Signed)
 Patient transported to CT

## 2024-04-14 NOTE — ED Notes (Signed)
 RN aware of pt's B/P repositioned pt and readjusted blood pressure cuff

## 2024-04-14 NOTE — ED Provider Notes (Signed)
 Received patient in turnover from Dr. Theadore.  Please see their note for further details of Hx, PE.  Briefly patient is a 89 y.o. female with a Weakness .  Discussion was made with family to become comfort care.  Awaiting social work to see if we can dispo her back to her facility.  Process has been completed.  DNR filled out at bedside.  Will discharge back to her facility    Emil Share, DO 04/14/24 1206

## 2024-04-14 NOTE — Progress Notes (Signed)
 RNCM notified of consult and will assist.

## 2024-04-14 NOTE — ED Notes (Signed)
 Message from CT tech that IV is leaking; RN went to assess. IV is infiltrated and discontinued. RN looked for other possible places. Ultimately, it was decided that US  would be better. Mickey, RN at CT to get access.

## 2024-04-14 NOTE — ED Notes (Signed)
 Report given to Gaffney, Charity fundraiser at [employer].

## 2024-04-14 NOTE — ED Notes (Signed)
 No distress or pain noted

## 2024-04-14 NOTE — ED Provider Notes (Signed)
  Provider Note MRN:  [id number]  Arrival date & time: 04/14/24    ED Course and Medical Decision Making  Assumed care of patient at sign-out or upon transfer.  The patient malaise low energy hypercalcemia awaiting CT scan to evaluate for metastatic disease.  Also with evidence of UTI suspect need for admission.  4 AM update: Spoke with patient, patient's family, patient's POA.  We discussed the CT results.  Likely primary lung cancer with metastatic spread.  Patient has had very poor quality of life for the past 2 months, long history of dementia.  Patient, POA not interested in aggressive management, actually in favor of full comfort measures.  Confirmed this with POA and other family present.  No need for further diagnostics, focusing purely on comfort.  Family understanding that this is a terminal illness and will eventually lead to patient's death.  However patient seems to be very stable at this time all things considered, resting comfortably, normal vital signs, some laboratory abnormalities.  Given the transition to comfort care, no obvious need for admission.  Will consult TOC to see if her facility can manage this level of care, hospice, palliative, excetra.  Family agreeable with this plan.  .Critical Care  Performed by: Theadore Ozell HERO, MD Authorized by: Theadore Ozell HERO, MD   Critical care provider statement:    Critical care time (minutes):  35   Critical care was necessary to treat or prevent imminent or life-threatening deterioration of the following conditions: End-of-life care discussions.   Critical care was time spent personally by me on the following activities:  Development of treatment plan with patient or surrogate, discussions with consultants, evaluation of patient's response to treatment, examination of patient, ordering and review of laboratory studies, ordering and review of radiographic studies, ordering and performing treatments and interventions, pulse oximetry,  re-evaluation of patient's condition and review of old charts   Final Clinical Impressions(s) / ED Diagnoses     ICD-10-CM   1. Hypercalcemia  E83.52     2. Dehydration  E86.0     3. Metastatic malignant neoplasm, unspecified site [facility])  C79.9       ED Discharge Orders     None       Discharge Instructions   None     Ozell HERO. Theadore, MD [facility] Emergency Medicine [facility] [email or web address]    Theadore Ozell HERO, MD 04/14/24 [phone]

## 2024-04-14 NOTE — ED Notes (Signed)
 [facility] called for transport to [facility]

## 2024-04-14 NOTE — Care Management (Addendum)
 Transition of Care [facility]) - Emergency Department Mini Assessment   Patient Details  Name: [name] MRN: [id number] Date of Birth: Dec 19, 1930  [facility] CM/SW Contact:    Corean JAYSON Canary, RN Phone Number: 04/14/2024, 8:40 AM   Clinical Narrative:  Referral for Hospice.  Called [facility] to see if they contract with any particular hospice. They recommended [facility]. Called Theoplis Ahle contact for patient. The patient is from [facility] and they would like us  to call to see if she can be seen at [facility], Left message to return call [phone] Heard back from Hospice at [facility]). They can see the patient at [facility]. . Faxed over information to [facility] [phone] ED Mini Assessment:             Interventions which prevented an admission or readmission: Hospice    Patient Contact and Communications        ,                 Admission diagnosis:  generalized weakness Patient Active Problem List   Diagnosis Date Noted   Protein-calorie malnutrition, severe (HCC) 11/13/2023   Aortic atherosclerosis (HCC) 11/11/2023   Closed left acetabular fracture (HCC) 11/01/2023   History of stroke 11/01/2023   Moderate dementia without behavioral disturbance, psychotic disturbance, mood disturbance, or anxiety (HCC) 03/18/2023   CKD (chronic kidney disease) stage 3, GFR 30-59 ml/min (HCC) 03/09/2019   Chronic anticoagulation 03/09/2019   Acquired trigger finger of right ring finger 10/26/2018   AV malformation of gastrointestinal tract    Primary osteoarthritis involving multiple joints 02/07/2015   High risk medication use 06/21/2013   Chronic atrial fibrillation with RVR (HCC) 11/03/2008   Hyperlipidemia 10/24/2008   ANEMIA, IRON DEFICIENCY, MICROCYTIC 10/24/2008   PCP:  Ruthellen Saralyn Pica Of Pharmacy:   [id number] - [place] [place] - [address] [place] [address] Phone:  [phone] Fax: [phone]  [name] [place] - [address] [place] [address] Phone: [phone] Fax: [phone]

## 2024-04-15 DIAGNOSIS — D649 Anemia, unspecified: Secondary | ICD-10-CM | POA: Diagnosis not present

## 2024-04-15 DIAGNOSIS — F0390 Unspecified dementia without behavioral disturbance: Secondary | ICD-10-CM | POA: Diagnosis not present

## 2024-04-15 DIAGNOSIS — C3490 Malignant neoplasm of unspecified part of unspecified bronchus or lung: Secondary | ICD-10-CM | POA: Diagnosis not present

## 2024-04-15 DIAGNOSIS — Z7901 Long term (current) use of anticoagulants: Secondary | ICD-10-CM | POA: Diagnosis not present

## 2024-04-15 DIAGNOSIS — I4891 Unspecified atrial fibrillation: Secondary | ICD-10-CM | POA: Diagnosis not present

## 2024-04-15 DIAGNOSIS — F39 Unspecified mood [affective] disorder: Secondary | ICD-10-CM | POA: Diagnosis not present

## 2024-04-15 DIAGNOSIS — C799 Secondary malignant neoplasm of unspecified site: Secondary | ICD-10-CM | POA: Diagnosis not present

## 2024-04-15 DIAGNOSIS — Z515 Encounter for palliative care: Secondary | ICD-10-CM | POA: Diagnosis not present

## 2024-04-16 DIAGNOSIS — F0150 Vascular dementia without behavioral disturbance: Secondary | ICD-10-CM | POA: Diagnosis not present

## 2024-04-16 DIAGNOSIS — R627 Adult failure to thrive: Secondary | ICD-10-CM | POA: Diagnosis not present

## 2024-04-16 DIAGNOSIS — G4700 Insomnia, unspecified: Secondary | ICD-10-CM | POA: Diagnosis not present

## 2024-04-16 LAB — COMPREHENSIVE METABOLIC PANEL WITH GFR
ALT: 47 U/L — ABNORMAL HIGH (ref 0–44)
AST: 372 U/L — ABNORMAL HIGH (ref 15–41)
Albumin: 3.3 g/dL — ABNORMAL LOW (ref 3.5–5.0)
Alkaline Phosphatase: 526 U/L — ABNORMAL HIGH (ref 38–126)
Anion gap: 16 — ABNORMAL HIGH (ref 5–15)
BUN: 52 mg/dL — ABNORMAL HIGH (ref 8–23)
CO2: 21 mmol/L — ABNORMAL LOW (ref 22–32)
Calcium: 13.3 mg/dL (ref 8.9–10.3)
Chloride: 95 mmol/L — ABNORMAL LOW (ref 98–111)
Creatinine, Ser: 1.25 mg/dL — ABNORMAL HIGH (ref 0.44–1.00)
GFR, Estimated: 41 mL/min — ABNORMAL LOW (ref >60)
Glucose, Bld: 79 mg/dL (ref 70–99)
Potassium: 4.8 mmol/L (ref 3.5–5.1)
Sodium: 132 mmol/L — ABNORMAL LOW (ref 135–145)
Total Bilirubin: 1.2 mg/dL (ref 0.0–1.2)
Total Protein: 6.9 g/dL (ref 6.5–8.1)

## 2024-04-16 LAB — URINE CULTURE: Culture: >=100000 — AB

## 2024-04-17 ENCOUNTER — Telehealth (HOSPITAL_BASED_OUTPATIENT_CLINIC_OR_DEPARTMENT_OTHER): Payer: Self-pay | Admitting: *Deleted

## 2024-04-17 NOTE — Telephone Encounter (Signed)
 Post ED Visit - Positive Culture Follow-up  Culture report reviewed by antimicrobial stewardship pharmacist: [facility] []  Rankin Dee, Pharm.D. []  Venetia Gully, Pharm.D., BCPS AQ-ID []  Garrel Crews, Pharm.D., BCPS []  Almarie Lunger, Pharm.D., BCPS []  [place], [place].D., BCPS, AAHIVP []  Rosaline Bihari, Pharm.D., BCPS, AAHIVP []  Vernell Meier, PharmD, BCPS []  Latanya Hint, PharmD, BCPS []  Donald Medley, PharmD, BCPS []  Rocky Bold, PharmD []  Dorothyann Alert, PharmD, BCPS []  Morene Babe, PharmD  [facility] []  Rosaline Edison, PharmD []  Jigna Gadhia, PharmD []  Nikola Glogovac, PharmD []  Veva Seip, Rph []  Vernell Daunt) Leonce, PharmD []  Eva Allis, PharmD []  Rosaline Millet, PharmD []  Iantha Batch, PharmD []  Arvin Gauss, PharmD []  Wanda Hasting, PharmD []  Ronal Rav, PharmD []  Rocky Slade, PharmD [x]  Lacinda Moats, PharmD   Positive urine culture No antibiotics needed per Dr. Curtistine Dawn  [name] 04/17/2024, 9:22 AM

## 2024-05-05 DEATH — deceased
